# Patient Record
Sex: Male | Born: 1960 | Race: White | Hispanic: No | Marital: Single | Smoking: Never smoker
Health system: Southern US, Community
[De-identification: ages and names within clinical notes are randomized; demographics above are authoritative.]

## PROBLEM LIST (undated history)

## (undated) DIAGNOSIS — Z21 Asymptomatic human immunodeficiency virus [HIV] infection status: Secondary | ICD-10-CM

## (undated) DIAGNOSIS — IMO0002 Reserved for concepts with insufficient information to code with codable children: Secondary | ICD-10-CM

## (undated) DIAGNOSIS — C210 Malignant neoplasm of anus, unspecified: Secondary | ICD-10-CM

## (undated) DIAGNOSIS — I2510 Atherosclerotic heart disease of native coronary artery without angina pectoris: Secondary | ICD-10-CM

## (undated) DIAGNOSIS — Z86004 Personal history of in-situ neoplasm of other and unspecified digestive organs: Secondary | ICD-10-CM

## (undated) DIAGNOSIS — K5730 Diverticulosis of large intestine without perforation or abscess without bleeding: Secondary | ICD-10-CM

## (undated) DIAGNOSIS — R112 Nausea with vomiting, unspecified: Secondary | ICD-10-CM

## (undated) DIAGNOSIS — C211 Malignant neoplasm of anal canal: Secondary | ICD-10-CM

## (undated) DIAGNOSIS — I10 Essential (primary) hypertension: Secondary | ICD-10-CM

## (undated) DIAGNOSIS — Z85048 Personal history of other malignant neoplasm of rectum, rectosigmoid junction, and anus: Secondary | ICD-10-CM

## (undated) DIAGNOSIS — Z9889 Other specified postprocedural states: Secondary | ICD-10-CM

## (undated) DIAGNOSIS — Z9189 Other specified personal risk factors, not elsewhere classified: Secondary | ICD-10-CM

## (undated) DIAGNOSIS — F419 Anxiety disorder, unspecified: Secondary | ICD-10-CM

## (undated) DIAGNOSIS — E119 Type 2 diabetes mellitus without complications: Secondary | ICD-10-CM

## (undated) DIAGNOSIS — Z973 Presence of spectacles and contact lenses: Secondary | ICD-10-CM

## (undated) DIAGNOSIS — F32A Depression, unspecified: Secondary | ICD-10-CM

## (undated) DIAGNOSIS — Z955 Presence of coronary angioplasty implant and graft: Secondary | ICD-10-CM

## (undated) DIAGNOSIS — N529 Male erectile dysfunction, unspecified: Secondary | ICD-10-CM

## (undated) DIAGNOSIS — E785 Hyperlipidemia, unspecified: Secondary | ICD-10-CM

## (undated) DIAGNOSIS — F329 Major depressive disorder, single episode, unspecified: Secondary | ICD-10-CM

## (undated) HISTORY — PX: CORONARY ANGIOPLASTY WITH STENT PLACEMENT: SHX49

---

## 1983-06-26 HISTORY — PX: SEPTOPLASTY: SUR1290

## 1986-06-25 HISTORY — PX: INGUINAL HERNIA REPAIR: SUR1180

## 1997-02-17 ENCOUNTER — Encounter: Payer: Self-pay | Admitting: Cardiovascular Disease

## 2004-05-30 ENCOUNTER — Ambulatory Visit: Payer: Self-pay | Admitting: Internal Medicine

## 2006-08-16 ENCOUNTER — Ambulatory Visit: Payer: Self-pay | Admitting: Internal Medicine

## 2006-09-17 ENCOUNTER — Ambulatory Visit: Payer: Self-pay | Admitting: Internal Medicine

## 2006-09-24 ENCOUNTER — Ambulatory Visit: Payer: Self-pay | Admitting: Internal Medicine

## 2006-10-24 ENCOUNTER — Ambulatory Visit: Payer: Self-pay | Admitting: Internal Medicine

## 2006-11-24 ENCOUNTER — Ambulatory Visit: Payer: Self-pay | Admitting: Internal Medicine

## 2006-12-02 ENCOUNTER — Ambulatory Visit: Payer: Self-pay | Admitting: Internal Medicine

## 2006-12-12 ENCOUNTER — Ambulatory Visit: Payer: Self-pay | Admitting: Internal Medicine

## 2006-12-24 ENCOUNTER — Ambulatory Visit: Payer: Self-pay | Admitting: Internal Medicine

## 2007-06-26 HISTORY — PX: OTHER SURGICAL HISTORY: SHX169

## 2007-06-26 HISTORY — PX: SPHINCTEROTOMY: SHX5279

## 2008-05-28 ENCOUNTER — Ambulatory Visit: Payer: Self-pay | Admitting: General Surgery

## 2008-05-28 HISTORY — PX: COLONOSCOPY WITH PROPOFOL: SHX5780

## 2008-06-25 HISTORY — PX: OTHER SURGICAL HISTORY: SHX169

## 2008-08-06 ENCOUNTER — Ambulatory Visit: Payer: Self-pay | Admitting: General Surgery

## 2008-09-23 HISTORY — PX: CARDIOVASCULAR STRESS TEST: SHX262

## 2008-10-15 ENCOUNTER — Encounter: Payer: Self-pay | Admitting: Cardiovascular Disease

## 2009-03-25 ENCOUNTER — Ambulatory Visit: Payer: Self-pay | Admitting: Oncology

## 2009-04-01 ENCOUNTER — Encounter: Payer: Self-pay | Admitting: Cardiovascular Disease

## 2009-04-22 ENCOUNTER — Encounter: Payer: Self-pay | Admitting: Cardiovascular Disease

## 2009-04-25 ENCOUNTER — Ambulatory Visit: Payer: Self-pay | Admitting: Internal Medicine

## 2009-04-29 ENCOUNTER — Ambulatory Visit: Payer: Self-pay | Admitting: Internal Medicine

## 2009-05-25 ENCOUNTER — Ambulatory Visit: Payer: Self-pay | Admitting: Internal Medicine

## 2009-06-08 ENCOUNTER — Ambulatory Visit: Payer: Self-pay | Admitting: General Surgery

## 2009-06-08 HISTORY — PX: OTHER SURGICAL HISTORY: SHX169

## 2009-09-13 DIAGNOSIS — I10 Essential (primary) hypertension: Secondary | ICD-10-CM

## 2009-09-13 DIAGNOSIS — E785 Hyperlipidemia, unspecified: Secondary | ICD-10-CM | POA: Insufficient documentation

## 2009-09-13 DIAGNOSIS — I2510 Atherosclerotic heart disease of native coronary artery without angina pectoris: Secondary | ICD-10-CM

## 2010-04-26 ENCOUNTER — Ambulatory Visit: Payer: Self-pay | Admitting: Cardiovascular Disease

## 2010-06-03 ENCOUNTER — Encounter: Payer: Self-pay | Admitting: Cardiovascular Disease

## 2010-07-05 ENCOUNTER — Telehealth: Payer: Self-pay | Admitting: Cardiovascular Disease

## 2010-07-25 NOTE — Assessment & Plan Note (Signed)
Summary: ec6   Visit Type:  Initial Consult Primary Provider:  Orson Aloe, M.D.  CC:  Est. care; former patient of [facility]. Denies chest pain or shortness of breath. .  History of Present Illness: Julian Palmer is a very pleasant 50 year old gentleman with a history of coronary artery disease, stent placed to his proximal LAD in 1998 with angioplasty to the mid and distal LAD, diabetes, hypertension who presents for routine followup. He was last in the [facility] in October 2010.  Overall, he reports that he is doing well. He is active, has no shortness of breath or chest pain. He did decrease his Vytorin in half to 5/40 mg daily as his cholesterol is less than 100 when last checked in October of 2010.  He is having some family issues as his father has a significant drinking problem. He went into rehabilitation [facility] for Julian Palmer and continues to drink. This is affected his relationship with his father as he continues to encourage him to seek treatment with his father often refusing any help.  EKG shows normal sinus rhythm with rate 89 beats per minute  , no significant ST-T wave changesCardiac catheterization report from 1998 shows a totally occluded LAD after the first septal branch. Otherwise no other significant coronary artery disease. Balloon angioplasty was performed of the proximal mid LAD, stent placed to the proximal LAD with a 3.5 x 15 mm Multi-Link stent. Distal LAD was then angioplastied  Stress test in April 2010 was low risk scan, where he achieved 10 METS, with a very small region of mildly reduced perfusion is mildly reversible in the distal LAD territory  Preventive Screening-Counseling & Management  Alcohol-Tobacco     Smoking Status: never  Caffeine-Diet-Exercise     Does Patient Exercise: no      Drug Use:  no.    Current Medications (verified): 1)  Niaspan 1000 Mg Cr-Tabs (Niacin (Antihyperlipidemic)) .Marland Kitchen.. 1 Tab By Mouth At Bedtime,  30 Minutes After Aspirin 2)  Vytorin 10-40 Mg Tabs (Ezetimibe-Simvastatin) .... Take One Tablet By Mouth Dailyat Bedtime 3)  Cozaar 100 Mg Tabs (Losartan Potassium) .... One Tablet Once Daily 4)  Aspirin Ec 325 Mg Tbec (Aspirin) .... Take One Tablet By Mouth Daily 5)  Metformin Hcl 1000 Mg Tabs (Metformin Hcl) .Marland Kitchen.. 1 Tab Two Times A Day 6)  Multivitamins  Tabs (Multiple Vitamin) .Marland Kitchen.. 1 Tab Daily 7)  Glimepiride 2 Mg Tabs (Glimepiride) .Marland Kitchen.. 1 Tab Daily 8)  Paxil 20 Mg Tabs (Paroxetine Hcl) .Marland Kitchen.. 1-2 Tablets Once Daily  Allergies (verified): 1)  ! Sulfa  Past History:  Past Medical History: Last updated: 09/13/2009 CAD stent to his proximal LAD in 1998 angioplasty to the mid to distal LAD DM Hyperlipidemia Hypertension  Past Surgical History: Last updated: 09/13/2009 stent  Family History: Last updated: 04/26/2010 Father: Living; CAD Mother: Living; Hypertension, breast cancer survivor, diabetes Type II.  Social History: Last updated: 04/26/2010 Full Time Single  Tobacco Use - No.  Alcohol Use - yes--occas. Regular Exercise - no Drug Use - no  Risk Factors: Exercise: no (04/26/2010)  Risk Factors: Smoking Status: never (04/26/2010)  Family History: Father: Living; CAD Mother: Living; Hypertension, breast cancer survivor, diabetes Type II.  Social History: Full Time Single  Tobacco Use - No.  Alcohol Use - yes--occas. Regular Exercise - no Drug Use - no Smoking Status:  never Does Patient Exercise:  no Drug Use:  no  Review of Systems  The patient denies fever, weight  loss, weight gain, vision loss, decreased hearing, hoarseness, chest pain, syncope, dyspnea on exertion, peripheral edema, prolonged cough, abdominal pain, incontinence, muscle weakness, depression, and enlarged lymph nodes.    Vital Signs:  Patient profile:   50 year old male Height:      71 inches Weight:      224 pounds BMI:     31.35 Pulse rate:   89 / minute BP sitting:   112 / 72   (left arm) Cuff size:   regular  Vitals Entered By: Bishop Dublin, CMA (April 26, 2010 10:01 AM)  Physical Exam  General:  Well developed, well nourished, in no acute distress. Head:  normocephalic and atraumatic Neck:  Neck supple, no JVD. No masses, thyromegaly or abnormal cervical nodes. Lungs:  Clear bilaterally to auscultation and percussion. Heart:  Non-displaced PMI, chest non-tender; regular rate and rhythm, S1, S2 without murmurs, rubs or gallops. Carotid upstroke normal, no bruit.  Pedals normal pulses. No edema, no varicosities. Abdomen:  Bowel sounds positive; abdomen soft and non-tender without masses, mildly obese Msk:  Back normal, normal gait. Muscle strength and tone normal. Pulses:  pulses normal in all 4 extremities Extremities:  No clubbing or cyanosis. Neurologic:  Alert and oriented x 3. Skin:  Intact without lesions or rashes. Psych:  Normal affect.   Impression & Recommendations:  Problem # 1:  CAD, NATIVE VESSEL (ICD-414.01) history coronary artery disease, stent to the LAD. No symptoms of angina at this time. Negative stress test last year in April that was low risk with some distal LAD perfusion abnormality. No further testing at this time.  His updated medication list for this problem includes:    Aspirin 81 Mg Tbec (Aspirin) .Marland Kitchen... Take 2  tablets by mouth daily  Problem # 2:  HYPERLIPIDEMIA-MIXED (ICD-272.4) We will try to obtain his most recent lipid panel from Dr. Leavy Cella. Goal LDL is less than 70.  His updated medication list for this problem includes:    Niaspan 1000 Mg Cr-tabs (Niacin (antihyperlipidemic)) .Marland Kitchen... 1 tab by mouth at bedtime, 30 minutes after aspirin    Vytorin 10-40 Mg Tabs (Ezetimibe-simvastatin) .Marland Kitchen... Take one tablet by mouth dailyat bedtime  Problem # 3:  HYPERTENSION, BENIGN (ICD-401.1) Blood pressure is well controlled on his current medication regimen. We've made no changes.  His updated medication list for this  problem includes:    Cozaar 100 Mg Tabs (Losartan potassium) ..... One tablet once daily    Aspirin 81 Mg Tbec (Aspirin) .Marland Kitchen... Take 2  tablets by mouth daily  Patient Instructions: 1)  Your physician has recommended you make the following change in your medication: DECREASE aspirin 81mg  2 tabs daily 2)  Your physician wants you to follow-up in:   1 year You will receive a reminder letter in the mail two months in advance. If you don't receive a letter, please call our office to schedule the follow-up appointment. Prescriptions: CIALIS 5 MG TABS (TADALAFIL) Take 1 tablet by mouth once a day  #30 x 4   Entered by:   Benedict Needy, RN   Authorized by:   Dossie Arbour MD   Signed by:   Benedict Needy, RN on 04/26/2010   Method used:   Print then Give to Patient   RxID:   [id number]

## 2010-07-27 NOTE — Letter (Signed)
Summary: [facility] Report [facility] Cath Report 1998   Imported By: Roderic Ovens 06/16/2010 11:14:31  _____________________________________________________________________  External Attachment:    Type:   Image     Comment:   External Document

## 2010-07-27 NOTE — Progress Notes (Signed)
Summary: RX  Phone Note Refill Request Call back at Home Phone [phone] Call back at [phone] Message from:  Patient on July 05, 2010 4:13 PM  Refills Requested: Medication #1:  VYTORIN 10-40 MG TABS Take one tablet by mouth dailyat bedtime   Notes: SHOULD HAVE BEEN A 90 DAY SUPPLY-THIS WAS CALLED IN FOR A 30 DAY SUPPLY AND THE PT CANNOT AFFORD THIS [facility] home delivery.  Initial call taken by: Harlon Flor,  July 05, 2010 4:14 PM  Follow-up for Phone Call        Notified patient have contacted [facility] for an update on Vytorin 10/40 mg one tablet at bedtime for a 90 day supply with 3 refills.  Told the patient we will get some samples of the Vytorin 10/40 mg to help  him until his medications is available for his inconvience. Patient is aware and okay with this matter. Follow-up by: Bishop Dublin, CMA,  July 06, 2010 12:48 PM    Prescriptions: VYTORIN 10-40 MG TABS (EZETIMIBE-SIMVASTATIN) Take one tablet by mouth dailyat bedtime  #90 x 3   Entered by:   Bishop Dublin, CMA   Authorized by:   Dossie Arbour MD   Signed by:   Bishop Dublin, CMA on 07/06/2010   Method used:   Historical   RxID:   [id number]   Appended Document: RX notified patient we have some samples available to pick up.  Gave Vytorin 10/40 mg for two month supply.

## 2010-08-21 ENCOUNTER — Telehealth: Payer: Self-pay | Admitting: Cardiovascular Disease

## 2010-08-31 NOTE — Progress Notes (Signed)
Summary: RX  Phone Note Refill Request Call back at Home Phone [phone] Message from:  Patient on August 21, 2010 3:58 PM  Refills Requested: Medication #1:  LOSARTIN   Notes: 3 MONTH SUPPLY [facility] HOME DELIVERY  Initial call taken by: Harlon Flor,  August 21, 2010 3:58 PM    Prescriptions: COZAAR 100 MG TABS (LOSARTAN POTASSIUM) one tablet once daily  #90 x 3   Entered by:   Lysbeth Galas CMA   Authorized by:   Dossie Arbour MD   Signed by:   Lysbeth Galas CMA on 08/22/2010   Method used:   Electronically to        [facility]* (mail-order)             , [place]         Ph: [phone]       Fax: [phone]   RxID:   [id number]

## 2011-01-10 ENCOUNTER — Encounter: Payer: Self-pay | Admitting: Cardiology

## 2011-01-24 DIAGNOSIS — IMO0001 Reserved for inherently not codable concepts without codable children: Secondary | ICD-10-CM

## 2011-01-24 HISTORY — DX: Reserved for inherently not codable concepts without codable children: IMO0001

## 2011-02-05 ENCOUNTER — Ambulatory Visit: Payer: Self-pay | Admitting: General Surgery

## 2011-02-05 HISTORY — PX: OTHER SURGICAL HISTORY: SHX169

## 2011-02-06 LAB — PATHOLOGY REPORT

## 2011-02-08 ENCOUNTER — Ambulatory Visit: Payer: Self-pay | Admitting: Oncology

## 2011-02-09 ENCOUNTER — Ambulatory Visit: Payer: Self-pay | Admitting: Oncology

## 2011-02-21 ENCOUNTER — Ambulatory Visit: Payer: Self-pay | Admitting: General Surgery

## 2011-02-21 HISTORY — PX: OTHER SURGICAL HISTORY: SHX169

## 2011-02-24 ENCOUNTER — Ambulatory Visit: Payer: Self-pay | Admitting: Oncology

## 2011-03-26 ENCOUNTER — Ambulatory Visit: Payer: Self-pay | Admitting: Oncology

## 2011-04-26 ENCOUNTER — Ambulatory Visit: Payer: Self-pay | Admitting: Oncology

## 2011-05-04 ENCOUNTER — Ambulatory Visit: Payer: Self-pay | Admitting: Cardiovascular Disease

## 2011-05-07 ENCOUNTER — Encounter: Payer: Self-pay | Admitting: Cardiovascular Disease

## 2011-05-09 ENCOUNTER — Ambulatory Visit: Payer: Self-pay | Admitting: Cardiovascular Disease

## 2011-05-18 ENCOUNTER — Encounter: Payer: Self-pay | Admitting: Cardiovascular Disease

## 2011-05-18 ENCOUNTER — Ambulatory Visit (INDEPENDENT_AMBULATORY_CARE_PROVIDER_SITE_OTHER): Payer: Private Health Insurance - Indemnity | Admitting: Cardiovascular Disease

## 2011-05-18 DIAGNOSIS — C211 Malignant neoplasm of anal canal: Secondary | ICD-10-CM

## 2011-05-18 DIAGNOSIS — E785 Hyperlipidemia, unspecified: Secondary | ICD-10-CM

## 2011-05-18 DIAGNOSIS — I2510 Atherosclerotic heart disease of native coronary artery without angina pectoris: Secondary | ICD-10-CM

## 2011-05-18 DIAGNOSIS — C20 Malignant neoplasm of rectum: Secondary | ICD-10-CM

## 2011-05-18 DIAGNOSIS — I10 Essential (primary) hypertension: Secondary | ICD-10-CM

## 2011-05-18 DIAGNOSIS — Z79899 Other long term (current) drug therapy: Secondary | ICD-10-CM

## 2011-05-18 HISTORY — DX: Malignant neoplasm of anal canal: C21.1

## 2011-05-18 MED ORDER — NIACIN ER (ANTIHYPERLIPIDEMIC) 1000 MG PO TBCR: 1000.0000 mg | EXTENDED_RELEASE_TABLET | Freq: Every day | ORAL | Status: DC

## 2011-05-18 MED ORDER — EZETIMIBE-SIMVASTATIN 10-40 MG PO TABS: 1.0000 | ORAL_TABLET | Freq: Every day | ORAL | Status: DC

## 2011-05-18 MED ORDER — LOSARTAN POTASSIUM 100 MG PO TABS: 100.0000 mg | ORAL_TABLET | Freq: Every day | ORAL | Status: DC

## 2011-05-18 NOTE — Assessment & Plan Note (Signed)
Currently with no symptoms of angina. No further workup at this time. Continue current medication regimen. 

## 2011-05-18 NOTE — Progress Notes (Signed)
Patient ID: Julian Palmer, male    DOB: December 29, 1960, 50 y.o.   MRN: [id number]  HPI Comments: Julian Palmer is a very pleasant 50 year old gentleman with a history of coronary artery disease, stent placed to his proximal LAD in 1998 with angioplasty to the mid and distal LAD, diabetes, hypertension who presents for routine followup.    Overall, he reports that he is doing well. He has had a very difficult couple of months. He was diagnosed with rectal cancer and had resection on August 13, followed by radiation of more than 30 cycles, chemotherapy for 2 weeks. He is starting to get his strength back. He did have significant burning from radiation in his inguinal area. He denies any shortness of breath or chest pain.   EKG shows normal sinus rhythm with rate 89 beats per minute   Cardiac catheterization report from 1998 shows a totally occluded LAD after the first septal branch. Otherwise no other significant coronary artery disease. Balloon angioplasty was performed of the proximal mid LAD, stent placed to the proximal LAD with a 3.5 x 15 mm Multi-Link stent. Distal LAD was then angioplastied   Stress test in April 2010 was low risk scan, where he achieved 10 METS, with a very small region of mildly reduced perfusion is mildly reversible in the distal LAD territory  EKG shows normal sinus rhythm with rate 82 beats per minute with no significant ST or T wave changes, old anteroseptal infarct   Outpatient Encounter Prescriptions as of 05/18/2011  Medication Sig Dispense Refill  . aspirin (ASPIR-81) 81 MG EC tablet Take 81 mg by mouth daily. Take 2 tabs       . efavirenz-emtrictabine-tenofovir (ATRIPLA) 600-200-300 MG per tablet Take 1 tablet by mouth daily.        Marland Kitchen ezetimibe-simvastatin (VYTORIN) 10-40 MG per tablet Take 1 tablet by mouth at bedtime.  90 tablet  4  . glimepiride (AMARYL) 2 MG tablet Take 2 mg by mouth daily.        Marland Kitchen losartan (COZAAR) 100 MG tablet Take 1 tablet (100 mg total) by  mouth daily.  90 tablet  4  . metFORMIN (GLUCOPHAGE) 1000 MG tablet Take 1,000 mg by mouth 2 (two) times daily.        . Multiple Vitamins-Minerals (MULTIVITAL) tablet Take 1 tablet by mouth daily.        . niacin (NIASPAN) 1000 MG CR tablet Take 1 tablet (1,000 mg total) by mouth at bedtime. 30 minutes after aspirin  90 tablet  4  . PARoxetine (PAXIL) 20 MG tablet Take 20 mg by mouth daily. Take 1-2 tabs         Review of Systems  Constitutional: Negative.   HENT: Negative.   Eyes: Negative.   Respiratory: Negative.   Cardiovascular: Negative.   Gastrointestinal: Negative.   Musculoskeletal: Negative.   Skin: Negative.   Neurological: Negative.   Hematological: Negative.   Psychiatric/Behavioral: Negative.   All other systems reviewed and are negative.    BP 112/82  Pulse 82  Ht 5\' 11"  (1.803 m)  Wt 225 lb 12.8 oz (102.422 kg)  BMI 31.49 kg/m2  Physical Exam  Nursing note and vitals reviewed. Constitutional: He is oriented to person, place, and time. He appears well-developed and well-nourished.  HENT:  Head: Normocephalic.  Nose: Nose normal.  Mouth/Throat: Oropharynx is clear and moist.  Eyes: Conjunctivae are normal. Pupils are equal, round, and reactive to light.  Neck: Normal range of motion. Neck supple.  No JVD present.  Cardiovascular: Normal rate, regular rhythm, S1 normal, S2 normal, normal heart sounds and intact distal pulses.  Exam reveals no gallop and no friction rub.   No murmur heard. Pulmonary/Chest: Effort normal and breath sounds normal. No respiratory distress. He has no wheezes. He has no rales. He exhibits no tenderness.  Abdominal: Soft. Bowel sounds are normal. He exhibits no distension. There is no tenderness.  Musculoskeletal: Normal range of motion. He exhibits no edema and no tenderness.  Lymphadenopathy:    He has no cervical adenopathy.  Neurological: He is alert and oriented to person, place, and time. Coordination normal.  Skin: Skin is  warm and dry. No rash noted. No erythema.  Psychiatric: He has a normal mood and affect. His behavior is normal. Judgment and thought content normal.           Assessment and Plan

## 2011-05-18 NOTE — Patient Instructions (Addendum)
You are doing well. No medication changes were made. We will check cholesterol (fasting) in January 2013  Please call us if you have new issues that need to be addressed before your next appt.  The office will contact you for a follow up Appt. In 12 months

## 2011-05-18 NOTE — Assessment & Plan Note (Signed)
Recent resection, radiation treatment, chemotherapy. Currently no further treatment scheduled.

## 2011-05-18 NOTE — Assessment & Plan Note (Signed)
Cholesterol is at goal on the current lipid regimen. No changes to the medications were made.  

## 2011-05-18 NOTE — Assessment & Plan Note (Signed)
Blood pressure is well controlled on today's visit. No changes made to the medications. 

## 2011-05-26 ENCOUNTER — Ambulatory Visit: Payer: Self-pay | Admitting: Oncology

## 2011-06-11 ENCOUNTER — Encounter: Payer: Self-pay | Admitting: Cardiovascular Disease

## 2011-06-26 ENCOUNTER — Ambulatory Visit: Payer: Self-pay | Admitting: Oncology

## 2011-08-10 ENCOUNTER — Ambulatory Visit: Payer: Self-pay | Admitting: Internal Medicine

## 2011-08-22 ENCOUNTER — Other Ambulatory Visit: Payer: Self-pay | Admitting: Cardiovascular Disease

## 2011-08-22 MED ORDER — LOSARTAN POTASSIUM 100 MG PO TABS: 100.0000 mg | ORAL_TABLET | Freq: Every day | ORAL | Status: DC

## 2011-08-22 MED ORDER — NIACIN ER (ANTIHYPERLIPIDEMIC) 1000 MG PO TBCR: 1000.0000 mg | EXTENDED_RELEASE_TABLET | Freq: Every day | ORAL | Status: DC

## 2011-08-24 ENCOUNTER — Ambulatory Visit: Payer: Self-pay | Admitting: Internal Medicine

## 2011-09-12 ENCOUNTER — Telehealth: Payer: Self-pay

## 2011-09-12 NOTE — Telephone Encounter (Signed)
Spoke with Misty Stanley (pharmacist Tech) with [facility] home delivery for 90 day supply with 3 refills on losartan 100 mg take one tablet daily and Niaspan ER 1000 mg take one tablet daily 90 with 3 refills.  Spoke with Rosey Bath (pharmacisit) to verify the refill did go through.

## 2011-09-24 ENCOUNTER — Ambulatory Visit: Payer: Self-pay | Admitting: Internal Medicine

## 2011-10-24 ENCOUNTER — Ambulatory Visit: Payer: Self-pay | Admitting: Internal Medicine

## 2011-10-29 ENCOUNTER — Telehealth: Payer: Self-pay | Admitting: Cardiovascular Disease

## 2011-10-29 NOTE — Telephone Encounter (Signed)
Follow-up:     Patient called to say the proposed change would double his cost so it is not a viable option.  Please call back if you have any questions.

## 2011-10-29 NOTE — Telephone Encounter (Signed)
Spoke with pt. Pt will continue Vytorin and not change to Crestor and Zetia due to expense--he would have 2 co-pays instead of 1 co-pay.

## 2011-11-24 ENCOUNTER — Ambulatory Visit: Payer: Self-pay | Admitting: Internal Medicine

## 2011-12-24 ENCOUNTER — Ambulatory Visit: Payer: Self-pay | Admitting: Internal Medicine

## 2011-12-25 ENCOUNTER — Other Ambulatory Visit: Payer: Self-pay

## 2011-12-25 MED ORDER — EZETIMIBE-SIMVASTATIN 10-40 MG PO TABS: 1.0000 | ORAL_TABLET | Freq: Every day | ORAL | Status: DC

## 2011-12-25 NOTE — Telephone Encounter (Signed)
Refill sent to [facility] for Vytorin 10/40 mg take one tablet daily at bedtime.

## 2011-12-25 NOTE — Telephone Encounter (Signed)
Notified Diane with [facility] for refill on Vytorin 10/40 mg take one tablet qhs with 90 day supply and 3 refills. The [facility] apparently does not go through.

## 2012-02-04 ENCOUNTER — Ambulatory Visit: Payer: Self-pay | Admitting: Internal Medicine

## 2012-02-04 LAB — HEPATIC FUNCTION PANEL A (ARMC)
Albumin: 3.8 g/dL
Alkaline Phosphatase: 113 U/L
Bilirubin, Direct: 0.1 mg/dL
Bilirubin,Total: 0.3 mg/dL
SGOT(AST): 61 U/L — ABNORMAL HIGH
SGPT (ALT): 87 U/L — ABNORMAL HIGH
Total Protein: 8.1 g/dL

## 2012-02-04 LAB — CREATININE, SERUM
Creatinine: 1.02 mg/dL (ref 0.60–1.30)
EGFR (African American): >60
EGFR (Non-African Amer.): >60

## 2012-02-04 LAB — CBC CANCER CENTER
Basophil #: 0 x10 3/mm (ref 0.0–0.1)
Basophil %: 0.9 %
Eosinophil %: 3.5 %
HCT: 39.7 % — ABNORMAL LOW (ref 40.0–52.0)
HGB: 13.7 g/dL (ref 13.0–18.0)
Lymphocyte %: 37.8 %
MCV: 93 fL (ref 80–100)
Monocyte %: 9.3 %
Neutrophil #: 2.4 x10 3/mm (ref 1.4–6.5)
Neutrophil %: 48.5 %
RDW: 13 % (ref 11.5–14.5)
WBC: 4.8 x10 3/mm (ref 3.8–10.6)

## 2012-02-24 ENCOUNTER — Ambulatory Visit: Payer: Self-pay | Admitting: Internal Medicine

## 2012-05-20 ENCOUNTER — Ambulatory Visit: Payer: Private Health Insurance - Indemnity | Admitting: Cardiovascular Disease

## 2012-06-16 ENCOUNTER — Ambulatory Visit (INDEPENDENT_AMBULATORY_CARE_PROVIDER_SITE_OTHER): Payer: Private Health Insurance - Indemnity | Admitting: Cardiovascular Disease

## 2012-06-16 ENCOUNTER — Encounter: Payer: Self-pay | Admitting: Cardiovascular Disease

## 2012-06-16 VITALS — BP 124/82 | HR 76 | Ht 72.0 in | Wt 223.0 lb

## 2012-06-16 DIAGNOSIS — I2510 Atherosclerotic heart disease of native coronary artery without angina pectoris: Secondary | ICD-10-CM

## 2012-06-16 DIAGNOSIS — E785 Hyperlipidemia, unspecified: Secondary | ICD-10-CM

## 2012-06-16 DIAGNOSIS — I10 Essential (primary) hypertension: Secondary | ICD-10-CM

## 2012-06-16 MED ORDER — NIACIN ER (ANTIHYPERLIPIDEMIC) 1000 MG PO TBCR: 1000.0000 mg | EXTENDED_RELEASE_TABLET | Freq: Every day | ORAL | Status: DC

## 2012-06-16 MED ORDER — LOSARTAN POTASSIUM 100 MG PO TABS: 100.0000 mg | ORAL_TABLET | Freq: Every day | ORAL | Status: DC

## 2012-06-16 MED ORDER — EZETIMIBE-SIMVASTATIN 10-40 MG PO TABS: 1.0000 | ORAL_TABLET | Freq: Every day | ORAL | Status: DC

## 2012-06-16 NOTE — Assessment & Plan Note (Signed)
Blood pressure is well controlled on today's visit. No changes made to the medications. 

## 2012-06-16 NOTE — Progress Notes (Signed)
Patient ID: Julian Palmer, male    DOB: [birth year], 51 y.o.   MRN: [id number]  HPI Comments: Mr. Earnshaw is a very pleasant 51 year old gentleman with a history of coronary artery disease, stent placed to his proximal LAD in 1998 with angioplasty to the mid and distal LAD, diabetes, hypertension who presents for routine followup.     Prior dx of rectal cancer and had resection on February 05 2011, followed by radiation of more than 30 cycles, chemotherapy for 2 weeks. Overall he reports that he is doing well. His sugars are up and down. He continues to take Vytorin 10/40 mg daily. Cholesterol in April of 2013 was 177, LDL 77. He is gaining weight, not exercising on a regular basis  Cardiac catheterization report from 1998 shows a totally occluded LAD after the first septal branch. Otherwise no other significant coronary artery disease. Balloon angioplasty was performed of the proximal mid LAD, stent placed to the proximal LAD with a 3.5 x 15 mm Multi-Link stent. Distal LAD was then angioplastied   Stress test in April 2010 was low risk scan, where he achieved 10 METS, with a very small region of mildly reduced perfusion is mildly reversible in the distal LAD territory  EKG shows normal sinus rhythm with rate 76 beats per minute with no significant ST or T wave changes, old anteroseptal infarct   Outpatient Encounter Prescriptions as of 06/16/2012  Medication Sig Dispense Refill  . aspirin (ASPIR-81) 81 MG EC tablet Take 81 mg by mouth daily. Take 2 tabs       . efavirenz-emtrictabine-tenofovir (ATRIPLA) 600-200-300 MG per tablet Take 1 tablet by mouth daily.        Marland Kitchen ezetimibe-simvastatin (VYTORIN) 10-40 MG per tablet Take 1 tablet by mouth at bedtime.  90 tablet  4  . glimepiride (AMARYL) 2 MG tablet Take 4 mg by mouth daily.       Marland Kitchen losartan (COZAAR) 100 MG tablet Take 1 tablet (100 mg total) by mouth daily.  90 tablet  4  . metFORMIN (GLUCOPHAGE) 1000 MG tablet Take 1,000 mg by mouth 2 (two)  times daily.        . Multiple Vitamins-Minerals (MULTIVITAL) tablet Take 1 tablet by mouth daily.        . niacin (NIASPAN) 1000 MG CR tablet Take 1 tablet (1,000 mg total) by mouth at bedtime. 30 minutes after aspirin  90 tablet  4  . PARoxetine (PAXIL) 20 MG tablet Take 20 mg by mouth daily. Take 1-2 tabs         Review of Systems  Constitutional: Negative.   HENT: Negative.   Eyes: Negative.   Respiratory: Negative.   Cardiovascular: Negative.   Gastrointestinal: Negative.   Musculoskeletal: Negative.   Skin: Negative.   Neurological: Negative.   Hematological: Negative.   Psychiatric/Behavioral: Negative.   All other systems reviewed and are negative.    BP 124/82  Pulse 76  Ht 6' (1.829 m)  Wt 223 lb (101.152 kg)  BMI 30.24 kg/m2  Physical Exam  Nursing note and vitals reviewed. Constitutional: He is oriented to person, place, and time. He appears well-developed and well-nourished.  HENT:  Head: Normocephalic.  Nose: Nose normal.  Mouth/Throat: Oropharynx is clear and moist.  Eyes: Conjunctivae normal are normal. Pupils are equal, round, and reactive to light.  Neck: Normal range of motion. Neck supple. No JVD present.  Cardiovascular: Normal rate, regular rhythm, S1 normal, S2 normal, normal heart sounds and intact distal pulses.  Exam reveals no gallop and no friction rub.   No murmur heard. Pulmonary/Chest: Effort normal and breath sounds normal. No respiratory distress. He has no wheezes. He has no rales. He exhibits no tenderness.  Abdominal: Soft. Bowel sounds are normal. He exhibits no distension. There is no tenderness.  Musculoskeletal: Normal range of motion. He exhibits no edema and no tenderness.  Lymphadenopathy:    He has no cervical adenopathy.  Neurological: He is alert and oriented to person, place, and time. Coordination normal.  Skin: Skin is warm and dry. No rash noted. No erythema.  Psychiatric: He has a normal mood and affect. His behavior is  normal. Judgment and thought content normal.           Assessment and Plan

## 2012-06-16 NOTE — Assessment & Plan Note (Signed)
Currently with no symptoms of angina. No further workup at this time. Continue current medication regimen. 

## 2012-06-16 NOTE — Patient Instructions (Addendum)
You are doing well. No medication changes were made.  Please call us if you have new issues that need to be addressed before your next appt.  Your physician wants you to follow-up in: 12 months.  You will receive a reminder letter in the mail two months in advance. If you don't receive a letter, please call our office to schedule the follow-up appointment. 

## 2012-06-16 NOTE — Assessment & Plan Note (Signed)
Cholesterol is high. We have encouraged him to increase his exercise, watch his diet. We'll try to obtain his most recent lipid panel from Dr. Leavy Cella. We could add WelChol or fenofibrate if needed.

## 2012-06-27 ENCOUNTER — Other Ambulatory Visit: Payer: Self-pay | Admitting: Cardiovascular Disease

## 2012-06-27 MED ORDER — LOSARTAN POTASSIUM 100 MG PO TABS: 100.0000 mg | ORAL_TABLET | Freq: Every day | ORAL | Status: DC

## 2012-06-27 MED ORDER — NIACIN ER (ANTIHYPERLIPIDEMIC) 1000 MG PO TBCR: 1000.0000 mg | EXTENDED_RELEASE_TABLET | Freq: Every day | ORAL | Status: DC

## 2012-06-27 MED ORDER — EZETIMIBE-SIMVASTATIN 10-40 MG PO TABS: 1.0000 | ORAL_TABLET | Freq: Every day | ORAL | Status: DC

## 2012-06-27 NOTE — Telephone Encounter (Signed)
Refills were set to print and never received at pharmacy

## 2012-06-27 NOTE — Telephone Encounter (Signed)
Refilled Niaspan, Losartan and Vytorin to [facility] pharmacy.

## 2012-07-26 ENCOUNTER — Ambulatory Visit: Payer: Self-pay | Admitting: Internal Medicine

## 2012-08-04 ENCOUNTER — Telehealth: Payer: Self-pay | Admitting: *Deleted

## 2012-08-04 NOTE — Telephone Encounter (Signed)
Few options Could try atorvastatin 80 mg daily vytorin 80/10, cut in 1/2 and cholesterol will be too high crestor 40 with zetia the best

## 2012-08-04 NOTE — Telephone Encounter (Signed)
Please advise thanks.

## 2012-08-04 NOTE — Telephone Encounter (Signed)
Pt wants to know if there is a cheaper alternative to vytorin 10/40. Cost over $100 for 3 months

## 2012-08-05 ENCOUNTER — Other Ambulatory Visit: Payer: Self-pay

## 2012-08-05 MED ORDER — ATORVASTATIN CALCIUM 80 MG PO TABS: 80.0000 mg | ORAL_TABLET | Freq: Every day | ORAL | Status: DC

## 2012-08-05 NOTE — Telephone Encounter (Signed)
Pt informed He wishes to try atorvastatin 80 mg qd Will send to pharm

## 2012-08-05 NOTE — Telephone Encounter (Signed)
lmtcb

## 2012-08-11 LAB — CBC CANCER CENTER
Basophil #: 0.1 x10 3/mm (ref 0.0–0.1)
Basophil %: 1.1 %
Eosinophil #: 0.2 x10 3/mm (ref 0.0–0.7)
HCT: 42.3 % (ref 40.0–52.0)
HGB: 14.7 g/dL (ref 13.0–18.0)
Lymphocyte %: 37.1 %
MCHC: 34.7 g/dL (ref 32.0–36.0)
MCV: 92 fL (ref 80–100)
Monocyte #: 0.6 x10 3/mm (ref 0.2–1.0)
Monocyte %: 11.5 %
Platelet: 222 x10 3/mm (ref 150–440)
RBC: 4.6 10*6/uL (ref 4.40–5.90)
RDW: 13.1 % (ref 11.5–14.5)
WBC: 5.2 x10 3/mm (ref 3.8–10.6)

## 2012-08-11 LAB — CREATININE, SERUM
Creatinine: 1.09 mg/dL (ref 0.60–1.30)
EGFR (African American): >60

## 2012-08-11 LAB — HEPATIC FUNCTION PANEL A (ARMC)
Albumin: 4.1 g/dL (ref 3.4–5.0)
Alkaline Phosphatase: 99 U/L (ref 50–136)
Bilirubin, Direct: 0.1 mg/dL (ref 0.00–0.20)
Total Protein: 8.6 g/dL — ABNORMAL HIGH (ref 6.4–8.2)

## 2012-08-15 ENCOUNTER — Telehealth: Payer: Self-pay | Admitting: *Deleted

## 2012-08-15 MED ORDER — ATORVASTATIN CALCIUM 80 MG PO TABS: 80.0000 mg | ORAL_TABLET | Freq: Every day | ORAL | Status: DC

## 2012-08-15 NOTE — Telephone Encounter (Signed)
Pt called back to state he hasn't received his medication and doesn't think it has gotten through the [facility] Delivery system.  He states everytime he calls for refills there are problems with the system.  Please call Pharmacy to confirm his script was entered for the change of medicine.  Pt would like a call back to confirm this has been done.

## 2012-08-15 NOTE — Telephone Encounter (Signed)
Per pharmacist, pt called [employer] to tell them he doesn't want atorvastatin RX right now, says he received vytorin RX in mail pharmacist says they will need new RX sent via [facility] for when he needs to reorder Pt informed Understanding verb After he finishes current vytorin RX he will start atorvastatin as prescribed New RX will be sent to [place]

## 2012-08-15 NOTE — Telephone Encounter (Signed)
Error/sg

## 2012-08-15 NOTE — Telephone Encounter (Signed)
Per [employer] it is in the "pend/hold" status Unsure why. Not giving reason [employer] rep is checking with pharmacy to see if they can remove this med from "hold" status

## 2012-08-23 ENCOUNTER — Ambulatory Visit: Payer: Self-pay | Admitting: Internal Medicine

## 2012-11-05 ENCOUNTER — Ambulatory Visit: Payer: Self-pay | Admitting: Internal Medicine

## 2012-11-23 ENCOUNTER — Ambulatory Visit: Payer: Self-pay | Admitting: Internal Medicine

## 2013-04-17 ENCOUNTER — Encounter (INDEPENDENT_AMBULATORY_CARE_PROVIDER_SITE_OTHER): Payer: Self-pay

## 2013-04-17 ENCOUNTER — Encounter: Payer: Self-pay | Admitting: Cardiovascular Disease

## 2013-04-17 ENCOUNTER — Ambulatory Visit (INDEPENDENT_AMBULATORY_CARE_PROVIDER_SITE_OTHER): Payer: Private Health Insurance - Indemnity | Admitting: Cardiovascular Disease

## 2013-04-17 VITALS — BP 118/82 | HR 84 | Ht 71.0 in | Wt 218.0 lb

## 2013-04-17 DIAGNOSIS — E1165 Type 2 diabetes mellitus with hyperglycemia: Secondary | ICD-10-CM

## 2013-04-17 DIAGNOSIS — E785 Hyperlipidemia, unspecified: Secondary | ICD-10-CM

## 2013-04-17 DIAGNOSIS — C20 Malignant neoplasm of rectum: Secondary | ICD-10-CM

## 2013-04-17 DIAGNOSIS — I2510 Atherosclerotic heart disease of native coronary artery without angina pectoris: Secondary | ICD-10-CM

## 2013-04-17 DIAGNOSIS — N529 Male erectile dysfunction, unspecified: Secondary | ICD-10-CM

## 2013-04-17 MED ORDER — TADALAFIL 5 MG PO TABS: 5.0000 mg | ORAL_TABLET | Freq: Every day | ORAL | Status: DC | PRN

## 2013-04-17 MED ORDER — SILDENAFIL CITRATE 100 MG PO TABS: 100.0000 mg | ORAL_TABLET | Freq: Every day | ORAL | Status: DC | PRN

## 2013-04-17 NOTE — Assessment & Plan Note (Signed)
We have given him a prescription for Cialis and Viagra to try. We will refer him to urology, Dr. Achilles Dunk.  I am concerned about low testosterone history and recent chemotherapy with radiation for rectal cancer. He may need alternative therapies.

## 2013-04-17 NOTE — Progress Notes (Signed)
Patient ID: Julian Palmer, male    DOB: 1961/05/06, 52 y.o.   MRN: [id number]  HPI Comments: [name] is a very pleasant 52 year old gentleman with a history of coronary artery disease, stent placed to his proximal LAD in 1998 with angioplasty to the mid and distal LAD, diabetes, hypertension who presents for routine followup.    History of rectal cancer, s/p resection on February 05 2011, followed by radiation of more than 30 cycles, chemotherapy for 2 weeks. He reports significant blistering, particularly in his groin area from the radiation.  Overall he has been doing well. He does report that his sugars are up, hemoglobin A1c 7.4. No significant symptoms of chest pain or shortness of breath concerning for worsening CAD or ischemia. Energy is good, his working long hours. His biggest complaint is erectile dysfunction. This seemed to come on worse after his radiation and chemotherapy. He does report that his testosterone was low in the past. He did not tolerate the testosterone cream well as it seemed to be very slick on his skin, possibly leaving an oder. He is very interested in anything that might help his situation. Is currently on amoxicillin for a sinus infection  Cardiac catheterization report from 1998 shows a totally occluded LAD after the first septal branch. Otherwise no other significant coronary artery disease. Balloon angioplasty was performed of the proximal mid LAD, stent placed to the proximal LAD with a 3.5 x 15 mm Multi-Link stent. Distal LAD was then angioplastied   Stress test in April 2010 was low risk scan, where he achieved 10 METS, with a very small region of mildly reduced perfusion is mildly reversible in the distal LAD territory  Recent lab work shows total cholesterol 153, LDL 56, HDL 43, normal LFTs, creatinine 0.9, hematocrit 40, as mentioned hemoglobin A1c 7.4  EKG shows normal sinus rhythm with rate 84 beats per minute with no significant ST or T wave changes,  old anteroseptal infarct   Outpatient Encounter Prescriptions as of 04/17/2013  Medication Sig Dispense Refill  . amoxicillin-clavulanate (AUGMENTIN) 875-125 MG per tablet Take 1 tablet by mouth 2 (two) times daily.       Marland Kitchen aspirin (ASPIR-81) 81 MG EC tablet Take 81 mg by mouth daily. Take 2 tabs       . atorvastatin (LIPITOR) 80 MG tablet Take 1 tablet (80 mg total) by mouth daily.  90 tablet  3  . efavirenz-emtrictabine-tenofovir (ATRIPLA) 600-200-300 MG per tablet Take 1 tablet by mouth daily.        Marland Kitchen glimepiride (AMARYL) 2 MG tablet Take 8 mg tablets daily.      Marland Kitchen losartan (COZAAR) 100 MG tablet Take 1 tablet (100 mg total) by mouth daily.  90 tablet  4  . metFORMIN (GLUCOPHAGE) 1000 MG tablet Take 1,000 mg by mouth 2 (two) times daily.        . Multiple Vitamins-Minerals (MULTIVITAL) tablet Take 1 tablet by mouth daily.        . niacin (NIASPAN) 1000 MG CR tablet Take 1 tablet (1,000 mg total) by mouth at bedtime. 30 minutes after aspirin  90 tablet  4  . sildenafil (VIAGRA) 100 MG tablet Take 1 tablet (100 mg total) by mouth daily as needed for erectile dysfunction.  3 tablet  10  . sildenafil (VIAGRA) 100 MG tablet Take 1 tablet (100 mg total) by mouth daily as needed for erectile dysfunction.  3 tablet  10  . tadalafil (CIALIS) 5 MG tablet Take 1  tablet (5 mg total) by mouth daily as needed for erectile dysfunction.  30 tablet  6  . [DISCONTINUED] PARoxetine (PAXIL) 20 MG tablet Take 20 mg by mouth daily. Take 1-2 tabs        No facility-administered encounter medications on file as of 04/17/2013.    Review of Systems  Constitutional: Negative.   HENT: Negative.   Eyes: Negative.   Respiratory: Negative.   Cardiovascular: Negative.   Gastrointestinal: Negative.   Genitourinary:       Erectile dysfunction  Musculoskeletal: Negative.   Skin: Negative.   Neurological: Negative.   Psychiatric/Behavioral: Negative.   All other systems reviewed and are negative.    BP 118/82   Pulse 84  Ht 5\' 11"  (1.803 m)  Wt 218 lb (98.884 kg)  BMI 30.42 kg/m2  Physical Exam  Nursing note and vitals reviewed. Constitutional: He is oriented to person, place, and time. He appears well-developed and well-nourished.  HENT:  Head: Normocephalic.  Nose: Nose normal.  Mouth/Throat: Oropharynx is clear and moist.  Eyes: Conjunctivae are normal. Pupils are equal, round, and reactive to light.  Neck: Normal range of motion. Neck supple. No JVD present.  Cardiovascular: Normal rate, regular rhythm, S1 normal, S2 normal, normal heart sounds and intact distal pulses.  Exam reveals no gallop and no friction rub.   No murmur heard. Pulmonary/Chest: Effort normal and breath sounds normal. No respiratory distress. He has no wheezes. He has no rales. He exhibits no tenderness.  Abdominal: Soft. Bowel sounds are normal. He exhibits no distension. There is no tenderness.  Musculoskeletal: Normal range of motion. He exhibits no edema and no tenderness.  Lymphadenopathy:    He has no cervical adenopathy.  Neurological: He is alert and oriented to person, place, and time. Coordination normal.  Skin: Skin is warm and dry. No rash noted. No erythema.  Psychiatric: He has a normal mood and affect. His behavior is normal. Judgment and thought content normal.      Assessment and Plan

## 2013-04-17 NOTE — Assessment & Plan Note (Signed)
He reports he is in remission, has periodic exam.

## 2013-04-17 NOTE — Assessment & Plan Note (Signed)
Cholesterol is at goal on the current lipid regimen. No changes to the medications were made.  

## 2013-04-17 NOTE — Assessment & Plan Note (Signed)
Currently with no symptoms of angina. No further workup at this time. Continue current medication regimen. 

## 2013-04-17 NOTE — Assessment & Plan Note (Signed)
We have encouraged continued exercise, careful diet management in an effort to lose weight. 

## 2013-04-17 NOTE — Patient Instructions (Signed)
You are doing well.  Try the viagra and cialis (not at the same time!!)  Please call us if you have new issues that need to be addressed before your next appt.  Your physician wants you to follow-up in: 12 months.  You will receive a reminder letter in the mail two months in advance. If you don't receive a letter, please call our office to schedule the follow-up appointment.

## 2013-06-16 ENCOUNTER — Other Ambulatory Visit: Payer: Self-pay | Admitting: *Deleted

## 2013-06-16 ENCOUNTER — Telehealth: Payer: Self-pay | Admitting: *Deleted

## 2013-06-16 MED ORDER — LOSARTAN POTASSIUM 100 MG PO TABS: 100.0000 mg | ORAL_TABLET | Freq: Every day | ORAL | Status: DC

## 2013-06-16 NOTE — Telephone Encounter (Signed)
Requested Prescriptions   Signed Prescriptions Disp Refills  . losartan (COZAAR) 100 MG tablet 90 tablet 4    Sig: Take 1 tablet (100 mg total) by mouth daily.    Authorizing Provider: Antonieta Iba    Ordering User: Kendrick Fries

## 2013-06-16 NOTE — Telephone Encounter (Signed)
Losartin 100mg 

## 2013-06-22 ENCOUNTER — Other Ambulatory Visit: Payer: Self-pay | Admitting: *Deleted

## 2013-06-22 ENCOUNTER — Other Ambulatory Visit: Payer: Self-pay

## 2013-06-22 MED ORDER — LOSARTAN POTASSIUM 100 MG PO TABS: 100.0000 mg | ORAL_TABLET | Freq: Every day | ORAL | Status: DC

## 2013-06-22 NOTE — Telephone Encounter (Signed)
Pt requested that 90 day supply of losartan & niaspan be sent to [facility] for home delivery.

## 2013-06-22 NOTE — Telephone Encounter (Signed)
Patient called and needs precertification on  Losartin  (percert # [id number])

## 2013-06-23 NOTE — Telephone Encounter (Signed)
Faxed Prior Authorization form for Niaspan to [facility] [phone] awaiting approval.

## 2013-06-24 MED ORDER — NIACIN ER (ANTIHYPERLIPIDEMIC) 1000 MG PO TBCR: 1000.0000 mg | EXTENDED_RELEASE_TABLET | Freq: Every day | ORAL | Status: DC

## 2013-08-17 ENCOUNTER — Ambulatory Visit: Payer: Self-pay | Admitting: Internal Medicine

## 2013-08-18 LAB — CBC CANCER CENTER
BASOS PCT: 0.9 %
Basophil #: 0.1 x10 3/mm (ref 0.0–0.1)
EOS ABS: 0.3 x10 3/mm (ref 0.0–0.7)
EOS PCT: 4.7 %
HCT: 44.6 % (ref 40.0–52.0)
HGB: 14.8 g/dL (ref 13.0–18.0)
Lymphocyte #: 2 x10 3/mm (ref 1.0–3.6)
Lymphocyte %: 31.9 %
MCH: 31.3 pg (ref 26.0–34.0)
MCHC: 33.1 g/dL (ref 32.0–36.0)
MCV: 95 fL (ref 80–100)
MONO ABS: 0.6 x10 3/mm (ref 0.2–1.0)
Monocyte %: 9.6 %
NEUTROS ABS: 3.3 x10 3/mm (ref 1.4–6.5)
NEUTROS PCT: 52.9 %
PLATELETS: 224 x10 3/mm (ref 150–440)
RBC: 4.72 10*6/uL (ref 4.40–5.90)
RDW: 13 % (ref 11.5–14.5)
WBC: 6.2 x10 3/mm (ref 3.8–10.6)

## 2013-08-18 LAB — HEPATIC FUNCTION PANEL A (ARMC)
Albumin: 4 g/dL (ref 3.4–5.0)
Alkaline Phosphatase: 99 U/L
BILIRUBIN DIRECT: 0.1 mg/dL (ref 0.00–0.20)
BILIRUBIN TOTAL: 0.3 mg/dL (ref 0.2–1.0)
SGOT(AST): 23 U/L (ref 15–37)
SGPT (ALT): 47 U/L (ref 12–78)
TOTAL PROTEIN: 8.8 g/dL — AB (ref 6.4–8.2)

## 2013-08-18 LAB — CREATININE, SERUM
Creatinine: 1.17 mg/dL (ref 0.60–1.30)
EGFR (Non-African Amer.): >60

## 2013-08-23 ENCOUNTER — Ambulatory Visit: Payer: Self-pay | Admitting: Internal Medicine

## 2013-09-14 ENCOUNTER — Other Ambulatory Visit: Payer: Self-pay

## 2013-09-14 MED ORDER — NIACIN ER (ANTIHYPERLIPIDEMIC) 1000 MG PO TBCR: 1000.0000 mg | EXTENDED_RELEASE_TABLET | Freq: Every day | ORAL | Status: DC

## 2013-09-14 NOTE — Telephone Encounter (Signed)
Pt needs generic for Vytorin

## 2013-09-24 ENCOUNTER — Other Ambulatory Visit: Payer: Self-pay | Admitting: *Deleted

## 2013-09-24 MED ORDER — ATORVASTATIN CALCIUM 80 MG PO TABS: 80.0000 mg | ORAL_TABLET | Freq: Every day | ORAL | Status: DC

## 2013-09-24 NOTE — Telephone Encounter (Signed)
Requested Prescriptions   Signed Prescriptions Disp Refills  . atorvastatin (LIPITOR) 80 MG tablet 90 tablet 3    Sig: Take 1 tablet (80 mg total) by mouth daily.    Authorizing Provider: GOLLAN, TIMOTHY J    Ordering User: LOPEZ, MARINA C    

## 2013-10-20 ENCOUNTER — Ambulatory Visit: Payer: Self-pay | Admitting: Internal Medicine

## 2013-11-09 ENCOUNTER — Ambulatory Visit: Payer: Self-pay | Admitting: Internal Medicine

## 2013-11-10 ENCOUNTER — Ambulatory Visit: Payer: Self-pay | Admitting: Internal Medicine

## 2013-11-10 LAB — HEPATIC FUNCTION PANEL A (ARMC)
ALT: 48 U/L (ref 12–78)
AST: 26 U/L (ref 15–37)
Albumin: 4.1 g/dL (ref 3.4–5.0)
Alkaline Phosphatase: 81 U/L
Bilirubin, Direct: 0.1 mg/dL (ref 0.00–0.20)
Bilirubin,Total: 0.5 mg/dL (ref 0.2–1.0)
Total Protein: 8.4 g/dL — ABNORMAL HIGH (ref 6.4–8.2)

## 2013-11-10 LAB — CBC CANCER CENTER
Basophil #: 0.1 x10 3/mm (ref 0.0–0.1)
Basophil %: 1 %
Eosinophil #: 0.2 x10 3/mm (ref 0.0–0.7)
Eosinophil %: 2.8 %
HCT: 42.3 % (ref 40.0–52.0)
HGB: 14.5 g/dL (ref 13.0–18.0)
LYMPHS PCT: 34.9 %
Lymphocyte #: 1.9 x10 3/mm (ref 1.0–3.6)
MCH: 33 pg (ref 26.0–34.0)
MCHC: 34.4 g/dL (ref 32.0–36.0)
MCV: 96 fL (ref 80–100)
MONOS PCT: 11 %
Monocyte #: 0.6 x10 3/mm (ref 0.2–1.0)
Neutrophil #: 2.8 x10 3/mm (ref 1.4–6.5)
Neutrophil %: 50.3 %
Platelet: 185 x10 3/mm (ref 150–440)
RBC: 4.4 10*6/uL (ref 4.40–5.90)
RDW: 13.2 % (ref 11.5–14.5)
WBC: 5.5 x10 3/mm (ref 3.8–10.6)

## 2013-11-10 LAB — CREATININE, SERUM
Creatinine: 1.09 mg/dL (ref 0.60–1.30)
EGFR (Non-African Amer.): >60

## 2013-11-10 LAB — LACTATE DEHYDROGENASE: LDH: 239 U/L (ref 85–241)

## 2013-11-11 LAB — AFP TUMOR MARKER: AFP-Tumor Marker: 3.5 ng/mL (ref 0.0–8.3)

## 2013-11-11 LAB — CEA: CEA: 1.5 ng/mL (ref 0.0–4.7)

## 2013-11-23 ENCOUNTER — Ambulatory Visit: Payer: Self-pay | Admitting: Internal Medicine

## 2013-12-04 ENCOUNTER — Other Ambulatory Visit: Payer: Self-pay

## 2013-12-04 MED ORDER — NIACIN ER (ANTIHYPERLIPIDEMIC) 1000 MG PO TBCR: 1000.0000 mg | EXTENDED_RELEASE_TABLET | Freq: Every day | ORAL | Status: DC

## 2013-12-21 ENCOUNTER — Encounter: Payer: Self-pay | Admitting: General Surgery

## 2013-12-24 ENCOUNTER — Ambulatory Visit (INDEPENDENT_AMBULATORY_CARE_PROVIDER_SITE_OTHER): Payer: Managed Care, Other (non HMO) | Admitting: General Surgery

## 2013-12-24 ENCOUNTER — Encounter: Payer: Self-pay | Admitting: General Surgery

## 2013-12-24 ENCOUNTER — Other Ambulatory Visit: Payer: Self-pay

## 2013-12-24 VITALS — BP 124/72 | HR 76 | Resp 12 | Ht 71.0 in | Wt 214.0 lb

## 2013-12-24 DIAGNOSIS — D371 Neoplasm of uncertain behavior of stomach: Secondary | ICD-10-CM

## 2013-12-24 DIAGNOSIS — D375 Neoplasm of uncertain behavior of rectum: Secondary | ICD-10-CM

## 2013-12-24 DIAGNOSIS — Z85048 Personal history of other malignant neoplasm of rectum, rectosigmoid junction, and anus: Secondary | ICD-10-CM

## 2013-12-24 DIAGNOSIS — K6289 Other specified diseases of anus and rectum: Secondary | ICD-10-CM

## 2013-12-24 DIAGNOSIS — D378 Neoplasm of uncertain behavior of other specified digestive organs: Secondary | ICD-10-CM

## 2013-12-24 NOTE — Progress Notes (Signed)
Patient ID: Julian Palmer, male   DOB: [birth year], 53 y.o.   MRN: [id number]  Chief Complaint  Patient presents with  . Other    rectal mass    HPI Julian Palmer is a 53 y.o. male. here today for a rectal mass . He states he had noticed a small amount of dark blood with bowel movements (intermittently) over last few weeks. This prompted him to complete a digital exam at which time he identified a mass on the right side of the lower rectum.     HPI  Past Medical History  Diagnosis Date  . CAD (coronary artery disease)   . HTN (hypertension)   . HLD (hyperlipidemia)   . S/P angioplasty     stent to his proximal LAD in 1998 angioplasty to the mdi to distal LAD  . Carcinoma in situ of anal canal 2010  . Diabetes mellitus     Non insulin Dependent  . Cancer October 06, 2010    invasive squamous cell carcinoma, poorly differentiated, status post radiation/chemotherapy. 9 o'clock position.    Past Surgical History  Procedure Laterality Date  . Stent (other)    . Colonoscopy  2009  . Anal polyp excision  2009  . Hernia repair  1988  . Septoplasty  1985  . Excision anal mass  2012  . Angioplasty  1998  . Sphincterotomy  2009  . Anal skin squamous cell reexcision  2010    radiation and chemotherapy  . Insertion central venous access device w/ subcutaneous port      Family History  Problem Relation Age of Onset  . Diabetes type II Mother   . Breast cancer Mother     survivor  . Hypertension Mother   . Coronary artery disease Father     Social History History  Substance Use Topics  . Smoking status: Never Smoker   . Smokeless tobacco: Not on file     Comment: tobacco use- no   . Alcohol Use: No     Comment: occas.     Allergies  Allergen Reactions  . Sulfonamide Derivatives     Current Outpatient Prescriptions  Medication Sig Dispense Refill  . aspirin (ASPIR-81) 81 MG EC tablet Take 81 mg by mouth daily. Take 2 tabs       . atorvastatin (LIPITOR) 80 MG tablet  Take 1 tablet (80 mg total) by mouth daily.  90 tablet  3  . efavirenz-emtrictabine-tenofovir (ATRIPLA) 600-200-300 MG per tablet Take 1 tablet by mouth daily.        Marland Kitchen glimepiride (AMARYL) 2 MG tablet Take 8 mg tablets daily.      Marland Kitchen losartan (COZAAR) 100 MG tablet Take 1 tablet (100 mg total) by mouth daily.  90 tablet  4  . metFORMIN (GLUCOPHAGE) 1000 MG tablet Take 1,000 mg by mouth 2 (two) times daily.        . Multiple Vitamins-Minerals (MULTIVITAL) tablet Take 1 tablet by mouth daily.        . niacin (NIASPAN) 1000 MG CR tablet Take 1 tablet (1,000 mg total) by mouth at bedtime. 30 minutes after aspirin  90 tablet  3  . sildenafil (VIAGRA) 100 MG tablet Take 1 tablet (100 mg total) by mouth daily as needed for erectile dysfunction.  3 tablet  10  . testosterone cypionate (DEPOTESTOTERONE CYPIONATE) 200 MG/ML injection Inject 240 mg into the muscle.      . testosterone enanthate (DELATESTRYL) 200 MG/ML injection Inject 50 mg into  the muscle every 14 (fourteen) days. For IM use only       No current facility-administered medications for this visit.    Review of Systems Review of Systems  Constitutional: Negative.   Respiratory: Negative.   Cardiovascular: Negative.   Gastrointestinal: Negative.     Blood pressure 124/72, pulse 76, resp. rate 12, height 5\' 11"  (1.803 m), weight 214 lb (97.07 kg).  Physical Exam Physical Exam  Constitutional: He is oriented to person, place, and time. He appears well-developed and well-nourished.  Eyes: Conjunctivae are normal. No scleral icterus.  Cardiovascular: Normal rate, regular rhythm and normal heart sounds.   Pulmonary/Chest: Effort normal and breath sounds normal.  Abdominal: Soft. Bowel sounds are normal. There is no tenderness.  Genitourinary:        Neurological: He is alert and oriented to person, place, and time.  Skin: Skin is warm and dry.    Data Reviewed Anoscopy confirmed a fleshy mass at the 8-9 o'clock position (dorsal  lithotomy) just at the dentate line. The patient was amenable to biopsy and this was completed using a 5 mm biopsy forcep. 2 samples were obtained with scant bleeding.  Assessment    Rectal mass in area of previous invasive squamous cell carcinoma, status post chemoradiation.     Plan    The patient will be contacted when pathology is available. If a benign result was obtained, formal excision would be appropriate. If he has recurrent cancer, will be discussed surgical extirpation.     PCP: [name] 12/24/2013, 9:35 PM

## 2013-12-24 NOTE — Patient Instructions (Signed)
Follow up appointment to be announced.  

## 2013-12-29 LAB — PATHOLOGY

## 2013-12-30 ENCOUNTER — Telehealth: Payer: Self-pay | Admitting: General Surgery

## 2014-01-01 NOTE — Telephone Encounter (Signed)
Notified of benign path and need for formal excision. Wants to have procedure done in office. This may be difficult.  Will review and notify patient of plans.

## 2014-01-04 ENCOUNTER — Telehealth: Payer: Self-pay | Admitting: *Deleted

## 2014-01-04 NOTE — Telephone Encounter (Signed)
Message left for patient to call the office.  We need to arrange a surgery date. Also, need to ask patient about his daily aspirin dose.

## 2014-01-04 NOTE — Telephone Encounter (Signed)
Message copied by Dominga Ferry on Mon Jan 04, 2014  2:02 PM ------      Message from: Robert Bellow      Created: Sat Jan 02, 2014  9:45 AM       Notify the patient I don't think we can safely excise this area in the office, but we could do a with local anesthesia in the operating room which would make it necessary for him to have a driver. [id number], 30 minutes.       ----- Message -----         From: [facility] Lab Results In Interface         Sent: 12/30/2013  10:28 AM           To: Robert Bellow, MD                   ------

## 2014-01-04 NOTE — Telephone Encounter (Signed)
Patient's surgery has been scheduled for 01-08-14 at [facility]. This patient reports he is on one 81 mg aspirin daily. It is okay for patient to continue to take aspirin.

## 2014-01-05 ENCOUNTER — Other Ambulatory Visit: Payer: Self-pay | Admitting: General Surgery

## 2014-01-05 DIAGNOSIS — K6289 Other specified diseases of anus and rectum: Secondary | ICD-10-CM

## 2014-01-06 ENCOUNTER — Telehealth: Payer: Self-pay | Admitting: *Deleted

## 2014-01-06 ENCOUNTER — Telehealth: Payer: Self-pay

## 2014-01-06 NOTE — Telephone Encounter (Signed)
Leah notified as instructed. Surgery date is Friday, 01-08-14.

## 2014-01-06 NOTE — Telephone Encounter (Signed)
Julian Bellow, MD Tharon Aquas Otis Brace, LPN            Please instruct the patient to use one fleets enema one hour prior to his planned presentation at day surgery this coming Friday, July 17 in preparation for excision of the rectal lesion.        Message left for patient to call back regarding pre surgery instructions.

## 2014-01-06 NOTE — Telephone Encounter (Signed)
Notified patient as instructed, patient aware of instructions.

## 2014-01-06 NOTE — Telephone Encounter (Signed)
Message copied by Dominga Ferry on Wed Jan 06, 2014  9:30 AM ------      Message from: Jackson, Forest Gleason      Created: Tue Jan 05, 2014  5:16 PM       Please notify day surgery that we'll be doing this local with sedation rather than straight local. Thank you  Surgery 7/15. Patient notified about need for Fleets enema 1 hour prior to presentation. ------

## 2014-01-08 ENCOUNTER — Other Ambulatory Visit: Payer: Self-pay

## 2014-01-08 ENCOUNTER — Ambulatory Visit: Payer: Self-pay | Admitting: General Surgery

## 2014-01-08 ENCOUNTER — Encounter: Payer: Self-pay | Admitting: General Surgery

## 2014-01-08 DIAGNOSIS — D375 Neoplasm of uncertain behavior of rectum: Secondary | ICD-10-CM

## 2014-01-08 DIAGNOSIS — Z85048 Personal history of other malignant neoplasm of rectum, rectosigmoid junction, and anus: Secondary | ICD-10-CM

## 2014-01-08 DIAGNOSIS — D371 Neoplasm of uncertain behavior of stomach: Secondary | ICD-10-CM

## 2014-01-08 DIAGNOSIS — D378 Neoplasm of uncertain behavior of other specified digestive organs: Secondary | ICD-10-CM

## 2014-01-08 HISTORY — PX: OTHER SURGICAL HISTORY: SHX169

## 2014-01-11 ENCOUNTER — Telehealth: Payer: Self-pay | Admitting: General Surgery

## 2014-01-11 NOTE — Telephone Encounter (Signed)
Asked to call for path results.

## 2014-01-13 ENCOUNTER — Encounter: Payer: Self-pay | Admitting: General Surgery

## 2014-01-13 ENCOUNTER — Telehealth: Payer: Self-pay

## 2014-01-13 NOTE — Telephone Encounter (Signed)
I talked with Dr. Bary Castilla. I notified the patient to expect some bleeding on/off. Patient states it is not bad and it did occur after a BM, but that he will monitor the amount and call for increased concerns.

## 2014-01-13 NOTE — Telephone Encounter (Signed)
Patient called with concerns about bleeding from the rectum after surgery done on 01/08/14. He states that this just started today.

## 2014-01-13 NOTE — Telephone Encounter (Signed)
Message copied by Lesly Rubenstein on Wed Jan 13, 2014  8:51 AM ------      Message from: [place], Terry W      Created: Mon Jan 11, 2014  5:44 PM       Arrange for the patient to have a PET/CT: Staging recurrent anal cancer. ------

## 2014-01-13 NOTE — Telephone Encounter (Signed)
Spoke with patient about having a PET/CT scan done. Patient is scheduled for this at [facility] on 01/19/14 at 9:00 am. He will arrive by 8:45 am. He is to have a high protein, low carbohydrate meal 4 hours prior, he is to also increase his water intake and have no sugar or do any strenuous exercise in that time. He will take his medications as normal. patient is aware of date, time, and all instructions.

## 2014-01-19 ENCOUNTER — Encounter: Payer: Self-pay | Admitting: General Surgery

## 2014-01-19 ENCOUNTER — Ambulatory Visit: Payer: Self-pay | Admitting: General Surgery

## 2014-01-19 ENCOUNTER — Telehealth: Payer: Self-pay | Admitting: General Surgery

## 2014-01-19 ENCOUNTER — Ambulatory Visit (INDEPENDENT_AMBULATORY_CARE_PROVIDER_SITE_OTHER): Payer: Managed Care, Other (non HMO) | Admitting: General Surgery

## 2014-01-19 VITALS — BP 130/68 | HR 74 | Resp 12 | Ht 71.0 in | Wt 210.0 lb

## 2014-01-19 DIAGNOSIS — C211 Malignant neoplasm of anal canal: Secondary | ICD-10-CM

## 2014-01-19 NOTE — Progress Notes (Signed)
Patient ID: Julian Palmer, male   DOB: 1960/09/27, 53 y.o.   MRN: [id number]  Chief Complaint  Patient presents with  . Routine Post Op    rectal mass removal    HPI Julian Palmer is a 53 y.o. male here today for his post op rectal mass removal done on 01/08/14. Patient states he is doing okay. He had experienced a small amount of bleeding on postoperative day 3-4 which is resolved. He is having minimal pain. HPI  Past Medical History  Diagnosis Date  . CAD (coronary artery disease)   . HTN (hypertension)   . HLD (hyperlipidemia)   . S/P angioplasty     stent to his proximal LAD in 1998 angioplasty to the mdi to distal LAD  . Carcinoma in situ of anal canal 2010  . Diabetes mellitus     Non insulin Dependent  . Cancer October 06, 2010    invasive squamous cell carcinoma, poorly differentiated, status post radiation/chemotherapy. 9 o'clock position.    Past Surgical History  Procedure Laterality Date  . Stent (other)    . Colonoscopy  2009  . Anal polyp excision  2009  . Hernia repair  1988  . Septoplasty  1985  . Excision anal mass  2012  . Angioplasty  1998  . Sphincterotomy  2009  . Anal skin squamous cell reexcision  2010    radiation and chemotherapy  . Insertion central venous access device w/ subcutaneous port    . Rectal mass removal  01/08/14    Family History  Problem Relation Age of Onset  . Diabetes type II Mother   . Breast cancer Mother     survivor  . Hypertension Mother   . Coronary artery disease Father     Social History History  Substance Use Topics  . Smoking status: Never Smoker   . Smokeless tobacco: Not on file     Comment: tobacco use- no   . Alcohol Use: No     Comment: occas.     Allergies  Allergen Reactions  . Sulfa Antibiotics Hives  . Sulfonamide Derivatives     Current Outpatient Prescriptions  Medication Sig Dispense Refill  . aspirin (ASPIR-81) 81 MG EC tablet Take 81 mg by mouth daily. Take 2 tabs       . atorvastatin  (LIPITOR) 80 MG tablet Take 1 tablet (80 mg total) by mouth daily.  90 tablet  3  . efavirenz-emtrictabine-tenofovir (ATRIPLA) 600-200-300 MG per tablet Take 1 tablet by mouth daily.        Marland Kitchen glimepiride (AMARYL) 2 MG tablet Take 8 mg tablets daily.      Marland Kitchen losartan (COZAAR) 100 MG tablet Take 1 tablet (100 mg total) by mouth daily.  90 tablet  4  . metFORMIN (GLUCOPHAGE) 1000 MG tablet Take 1,000 mg by mouth 2 (two) times daily.        . Multiple Vitamins-Minerals (MULTIVITAL) tablet Take 1 tablet by mouth daily.        . niacin (NIASPAN) 1000 MG CR tablet Take 1 tablet (1,000 mg total) by mouth at bedtime. 30 minutes after aspirin  90 tablet  3  . sildenafil (VIAGRA) 100 MG tablet Take 1 tablet (100 mg total) by mouth daily as needed for erectile dysfunction.  3 tablet  10  . testosterone cypionate (DEPOTESTOTERONE CYPIONATE) 200 MG/ML injection Inject 240 mg into the muscle.      . testosterone enanthate (DELATESTRYL) 200 MG/ML injection Inject 50 mg into  the muscle every 14 (fourteen) days. For IM use only       No current facility-administered medications for this visit.    Review of Systems Review of Systems  Constitutional: Negative.   Respiratory: Negative.   Cardiovascular: Negative.     Blood pressure 130/68, pulse 74, resp. rate 12, height 5\' 11"  (1.803 m), weight 210 lb (95.255 kg).  Physical Exam Physical Exam External anal exam showed no inflammation or erythema. Digital exam was deferred.  Data Reviewed  Pathology showed invasive squamous cell carcinoma. Someone pathology to that at the time of his original diagnosis in August 2012.  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    Plan    The patient had completed combined chemoradiation with a complete clinical response. He is unlikely to be a candidate for additional radiation. The possibility of chemotherapy alone treating this is small. Standard treatment would be abdominal perineal  resection.  A PET scan is pending and the case will be presented at the [facility] tumor board on 01/21/2014.  The patient will be contacted by phone when the PET scan results are available and after tumor Board recommendations have been received.     PCP: [name] 01/21/2014, 12:15 PM

## 2014-01-19 NOTE — Telephone Encounter (Signed)
Notified PET/CT failed to show any additional disease. Area in ano-rectal area not identified. Case to be presented at [place] on July 30th.

## 2014-01-20 LAB — PATHOLOGY REPORT

## 2014-01-21 ENCOUNTER — Ambulatory Visit: Payer: Managed Care, Other (non HMO) | Admitting: General Surgery

## 2014-01-22 ENCOUNTER — Telehealth: Payer: Self-pay | Admitting: General Surgery

## 2014-01-22 DIAGNOSIS — F419 Anxiety disorder, unspecified: Secondary | ICD-10-CM

## 2014-01-22 DIAGNOSIS — F5105 Insomnia due to other mental disorder: Principal | ICD-10-CM

## 2014-01-22 MED ORDER — ZOLPIDEM TARTRATE 10 MG PO TABS: 10.0000 mg | ORAL_TABLET | Freq: Every evening | ORAL | Status: DC | PRN

## 2014-01-22 NOTE — Telephone Encounter (Signed)
I notified the patient that the recommendation from the [facility] tumor board was for abdominal perineal resection.  I spoke informally with the colorectal surgery Department at [facility] and they as well recommended abdominoperineal resection.  Patient is not yet ready decide on for this, and will likely be seeking a second opinion in [place]. This was encouraged.  He reports he has not slept since we talked earlier this week when we reviewed  the biopsy results. He was amenable to a trial of Ambien, 10 mg by mouth each bedtime. He denied any suicidal ideation.  The patient will contact the office if he needs assistance with with getting an appointment in [place] or desires referral to a local University.

## 2014-01-25 ENCOUNTER — Telehealth: Payer: Self-pay | Admitting: *Deleted

## 2014-01-25 ENCOUNTER — Telehealth: Payer: Self-pay

## 2014-01-25 NOTE — Telephone Encounter (Signed)
Patient reports he was able to get prescription from the pharmacy.

## 2014-01-25 NOTE — Telephone Encounter (Signed)
Patient states that he would like to go for a second opinion at [place] and needs [place] to call to set up a referral for him. I told him we would contact them to get this started and get them any records that they needed.

## 2014-01-25 NOTE — Telephone Encounter (Signed)
Spoke with patient about having a second opinion of surgical options done at [facility]. Patient is scheduled to see Dr Corie Chiquito on 02/09/14 at 2:50 pm. He is to arrive by 2:30 pm. Patient is aware of date, and time. All records have been faxed over to [place] at [place] [phone].

## 2014-01-25 NOTE — Telephone Encounter (Signed)
Message left for patient to call the office.  We need to see if he was able to pick up prescription for Ambien. It looks as if it did not go through electronically. Also, we need to verify which pharmacy he would like it sent to if it did not go through as suspected.

## 2014-01-26 ENCOUNTER — Telehealth: Payer: Self-pay | Admitting: *Deleted

## 2014-01-26 MED ORDER — CITALOPRAM HYDROBROMIDE 20 MG PO TABS: 20.0000 mg | ORAL_TABLET | Freq: Every day | ORAL | Status: DC

## 2014-01-26 NOTE — Telephone Encounter (Signed)
The office received a call from the patient's immediate supervisor, Garald Braver reporting that he was concerned about the patient's mental well-being. With the patient for about 25 minutes by phone at 1:15 this afternoon. He is very upset about the risk of dying during surgery, less so about having a colostomy. He did it in touch with his brother, and was planning to meet with his parents later in the afternoon to discuss his recent diagnosis of cancer. He did not on the phone appeared to be him at risk for self harm. He had a friend coming over and he appeared more optimistic at the end of our conversation at the beginning.  I spoke with a local psychiatrist who recommended Celexa 20 mg daily to have minimal drug interactions, and received information regarding the [place] walk-in assessment clinic.  I spoke with the patient at about 6:30 PM, and the meeting with his family had gone fairly well, and the friend who accompanied her in the afternoon was going to get him in touch with a person who had a colostomy to discuss life goes on afterwards.  He was going to pick up the Celexa prescription and he was given the contact information for walk-in psychiatric assessment. No call back if he cannot get an early appointment, otherwise will arrange for an elective appointment on August 17.  The patient has arranged for a second surgical opinion and [place] and records have been forwarded for this appointment.

## 2014-01-26 NOTE — Telephone Encounter (Signed)
Left message that a RX for Celexa 20 mg 1 po daily #30 with 5 refills as ordered by Dr Bary Castilla was sent electronically.

## 2014-01-27 ENCOUNTER — Telehealth: Payer: Self-pay

## 2014-01-27 ENCOUNTER — Telehealth: Payer: Self-pay | Admitting: *Deleted

## 2014-01-27 NOTE — Telephone Encounter (Signed)
Message copied by Lesly Rubenstein on Wed Jan 27, 2014  1:57 PM ------      Message from: [place], Forest Gleason      Created: Wed Jan 27, 2014 10:45 AM       Contact Dr. Maurine Simmering office and tell them I would like to take the August 17th appt they had available yesterday for this patient.            Thanks. ------

## 2014-01-27 NOTE — Telephone Encounter (Signed)
Patient called and said that he did not think that the 17th would be soon enough for him. I called [facility] office and they said that they had an opening available for August 12th. I spoke with Julian Palmer about this and he said that would be much better for him. I have faxed demographics and office notes to [facility] office. The patient is scheduled to see her on 02/03/14 at 10:00 am. He will arrive by 9:30 am to fill out new patient paperwork. Patient is aware of date and time.

## 2014-01-27 NOTE — Telephone Encounter (Signed)
Patient had gone to [facility] clinic this AM and was told they did not deal with [employer]. I had been told they would work to establish a referral to a covered provider.  Patient contacted by phone, He sounds much better than yesterday. Spoke w/ his immediate supervisor today who filled in some of the details on yesterday's events.  Have arrange for evaluation with Johna Roles, MD on August 12.    Celaxa RX started per patient report.

## 2014-01-27 NOTE — Telephone Encounter (Signed)
Pt called this morning saying that he [place] you [place] him about and they do not take his insurance, so he is calling you back about seeing another one.

## 2014-02-09 ENCOUNTER — Ambulatory Visit (INDEPENDENT_AMBULATORY_CARE_PROVIDER_SITE_OTHER): Payer: Private Health Insurance - Indemnity | Admitting: General Surgery

## 2014-02-09 ENCOUNTER — Telehealth (INDEPENDENT_AMBULATORY_CARE_PROVIDER_SITE_OTHER): Payer: Self-pay

## 2014-02-09 ENCOUNTER — Telehealth: Payer: Self-pay

## 2014-02-09 NOTE — Telephone Encounter (Signed)
Patient called to let us know that his appointment to go for a second opinion has been rescheduled to 02/16/14 and he is seeing Dr Jonita Albee. He also said that he no longer is taking Ambien. The psychiatrist has placed him on Trazodone 150 mg at bedtime and increased his Celexa to 40 mg daily. He reports that he is doing much better with these changes.

## 2014-02-12 NOTE — Telephone Encounter (Signed)
Erroneous encounter

## 2014-02-16 ENCOUNTER — Encounter (INDEPENDENT_AMBULATORY_CARE_PROVIDER_SITE_OTHER): Payer: Self-pay | Admitting: General Surgery

## 2014-02-16 ENCOUNTER — Ambulatory Visit (INDEPENDENT_AMBULATORY_CARE_PROVIDER_SITE_OTHER): Payer: Private Health Insurance - Indemnity | Admitting: General Surgery

## 2014-02-16 ENCOUNTER — Telehealth (INDEPENDENT_AMBULATORY_CARE_PROVIDER_SITE_OTHER): Payer: Self-pay | Admitting: General Surgery

## 2014-02-16 VITALS — BP 136/80 | HR 77 | Temp 97.4°F | Ht 71.0 in | Wt 207.0 lb

## 2014-02-16 DIAGNOSIS — C210 Malignant neoplasm of anus, unspecified: Secondary | ICD-10-CM

## 2014-02-16 NOTE — Patient Instructions (Signed)
Anal Cancer  What is anal cancer? Cancer describes a set of diseases in which normal cells in the body, through a series of genetic changes, become abnormal and lose the ability to control their growth. As cancers - also known as "malignancies" - grow, they invade the tissues around them (local invasion). They may also spread to other locations in the body via the blood vessels or lymphatic channels where they may implant and grow (metastases). The anus or anal canal is the passage that connects the rectum, or last part of the large intestine, to the outside of the body. Anal cancer arises from the cells around the anal opening or in the anal canal just inside the anal opening. Anal cancer is often a type of cancer called "squamous cell carcinoma". Other rare types of cancer may also occur in the anal canal and these require consultation with your physician or surgeon to determine the appropriate evaluation and treatment.  Cells that are becoming malignant or "premalignant", but have not invaded deeper into the skin, are referred to as "high-grade anal intraepithelial neoplasia" or HGAIN (previously referred to by a number of different terms, including "high grade dysplasia", "carcinoma-in-situ", "anal intra-epithelial neoplasia grade III", "high-grade squamous intraepithelial lesion", or "Bowen's disease"). While this condition is likely a precursor to anal cancer, this is not anal cancer and is treated differently than anal cancer. Your physician or colon and rectal surgeon can help clarify the differences. How common is anal cancer? Anal cancer is fairly uncommon, and accounts for about 1-2% of cancers affecting the intestinal tract. Approximately one in 64 men and women will get anal cancer in their lifetime (this can be compared to 1 in 59 men and women who will developed colon and rectal cancer in their lifetime). Almost 6,000 new cases of anal cancer are now diagnosed each year in the [place] with about  2/3rds of the cases in women. Approximately 800 people will die of the disease each year. Unlike some cancers, the numbers of patients that develop anal cancer each year is slowly increasing, especially in some higher risk groups (see below).  Who is at risk? A risk factor is something that increases a person's chance of getting a disease. Anal cancer is commonly associated with infection with the human papilloma virus (HPV), but some anal cancers develop without this infection being present. HPV can also cause warts in and around the anus as well as genital warts (on the penis in men and the vagina or cervix in women), but warts do not have to be present for anal cancer to develop. HPV is also associated with an increased risk of cervical and vaginal or vulvar cancer in women, penile cancer in men, as well as with some head and neck cancers in men and women. Having some of these cancers, especially cervical or vulvar cancer (or even pre-cancerous change in the cervix or vulva), can put people at increased risk for anal cancer - likely from the association with HPV infection.  Additional risk factors for anal cancer include: Age - While most of the cases of anal cancer develop in people over age 75, 1/3rd of the cases occur in patients that are younger than that.  Anal sex - People participating in anal sex, both men and women, are at increased risk.  Sexually transmitted diseases - Patients with multiple sex partners are at higher risk of getting sexually transmitted diseases like HPV and HIV and are, therefore, at an increased risk of developing anal  cancer.  Smoking - Harmful chemicals from smoking increases the risk of most cancers, including anal cancer.  Immunosuppression - People with weakened immune systems, such as transplant patients who must take drugs to suppress their immune systems and patients with HIV infection, are at higher risk.  Chronic local inflammation - People with long-standing anal  fistulas or open wounds in the anal area are at a slightly higher risk.  Pelvic radiation - People who have had pelvic radiation therapy for rectal, prostate, bladder or cervical cancer are at increased risk. Can anal cancer be prevented? Few cancers can be totally prevented, but the risk of developing anal cancer may be decreased significantly by avoiding the risk factors listed above and by getting regular checkups. Avoiding anal sex and infection with HPV and HIV can reduce the risk of developing anal cancer. Using condoms whenever having any kind of intercourse may reduce, but not eliminate, the risk of HPV infection. Smoking cessation lowers the risk of many types of cancer, including anal cancer. Vaccines against infection with certain types of HPV, especially in high-risk patients (see risk factors listed above), may also decrease the risk of developing anal cancer (in men and women). People who are at increased risk for anal cancer based on the risk factors listed above should talk to their doctors about consideration for anal cancer screening. This can include anal cytology or Pap tests (much like the Pap tests women undergo for cervical cancer screening). Early identification and treatment of premalignant lesions in the anus may also prevent the development of anal cancer.  What are the symptoms of anal cancer? Although 20% of anal cancers may be asymptomatic, many cases of anal cancer can be found early because they form in a part of the digestive tract that a doctor can see and reach easily. Anal cancers may cause symptoms such as:  Bleeding from the rectum or anus  The feeling of a lump or mass at the anal opening  Persistent or recurring pain in the anal area  Persistent or recurrent itching  Change in bowel habits (having more or fewer bowel movements) or increased straining during a bowel movement  Narrowing of the stools  Discharge or drainage (mucous or pus) from the anus  Swollen  lymph nodes (glands) in the anal or groin areas These symptoms can also be caused by less serious conditions such as hemorrhoids, but you should never assume this. If you have any of these symptoms, see your doctor or colon and rectal surgeon. How is anal cancer diagnosed? Anal cancer is usually found on examination of the anal canal because of the presence of symptoms listed above, on routine yearly physical exams by a physician (rectal exam for prostate check or at the time of a pelvic exam), or on screening tests such as those recommended for preventing or diagnosing colorectal cancer (for example: colonoscopy or lighted scope exam of the colon and rectum or yearly stool blood tests). Anoscopy, or examination of the anal canal with a small, lighted scope, may be performed as well to assess any abnormal findings. If an abnormal area in the anal canal is identified based on the doctor's exam, a biopsy will be performed to determine the diagnosis. If the diagnosis of anal cancer is confirmed, additional tests to determine the extent of the cancer may be recommended, which may include ultrasounds, Xrays, CT scans, and/or PET scans.  How are anal cancers treated? Treatment for most cases of anal cancer is very effective in  curing the cancer. There are 3 basic types of treatment used for anal cancer:  Surgery - an operation to remove the cancer  Radiation therapy - high-dose x-rays to kill cancer cells  Chemotherapy - giving drugs to kill cancer cells Combination therapy including radiation therapy and chemotherapy is now considered the standard treatment for most anal cancers. Occasionally, a very small or early tumor may be removed surgically (local excision) without the need for further treatment and with minimal damage to the anal sphincter muscles that are important for bowel control. On occasion, more major surgery to remove the anal cancer is needed, and this requires the creation of a colostomy where  the bowel is brought out to the skin on the belly wall where a bag is attached to collect the fecal matter. Will I need a colostomy? The majority of patients treated for anal cancer will not need a colostomy. If the tumor does not respond completely to combination therapy, if it recurs after treatment, or if it is an unusual type of anal cancer, removal of the rectum and anus and creation of a permanent colostomy may be necessary. This operation is known as an abdominoperineal resection (APR). What happens after treatment for anal cancer? Follow-up care to assess the results of treatment and to check for recurrence is very important. Most anal cancers are cured with combination therapy and/or surgery, so you should report any symptoms or problems to your doctor or surgeon right away. In addition, many tumors that recur may be successfully treated with surgery if they are caught early. A careful examination by an experienced physician or colon and rectal surgeon at regular intervals is the most important method of follow-up. Additional studies, such as certain types of scans (for example, CT or MRI) or ultrasounds, may also be recommended.  Conclusion Anal cancers are unusual tumors arising from the skin or lining of the anal canal. As with most cancers, early detection is associated with excellent survival. Most tumors are well-treated with combination chemotherapy and radiation. Recurrences, treatment failures, and advanced disease may require surgery. Follow the recommended screening examinations for anal and colorectal cancer and consult your doctor or colon and rectal surgeon early when any concerning symptoms occur.  What is a colon and rectal surgeon? Colon and rectal surgeons are experts in the surgical and non-surgical treatment of diseases of the colon, rectum and anus. They have completed advanced surgical training in the treatment of these diseases as well as full general surgical training.  Board-certified colon and rectal surgeons complete residencies in general surgery and colon and rectal surgery, and pass intensive examinations conducted by the American Board of Surgery and the American Board of Colon and Rectal Surgery. They are well-versed in the treatment of both benign and malignant diseases of the colon, rectum and anus and are able to perform routine screening examinations and surgically treat conditions if indicated to do so. Pryor CuriaJunious Dresser. Cristela Blue, MD, FACS, FASCRS, on behalf of the [place]

## 2014-02-16 NOTE — Progress Notes (Addendum)
No chief complaint on file.   HISTORY: Julian Palmer is a 53 y.o. male who presents to the office with a new/recurrent anal cancer.  This was found on excisional biopsy.  Workup thus far has included PET scan, which was neg for metastatic disease.  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  He developed anal cancer about 4 years ago and was treated with chemoRT with a complete response. He has lost about 10 lbs in last 2 months, which he attributes to depression.  He denies any rectal bleeding or pain.  Per pt, his HIV is well controlled and undetectable.  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    Past Medical History  Diagnosis Date  . CAD (coronary artery disease)   . HTN (hypertension)   . HLD (hyperlipidemia)   . S/P angioplasty     stent to his proximal LAD in 1998 angioplasty to the mdi to distal LAD  . Carcinoma in situ of anal canal 2010  . Diabetes mellitus     Non insulin Dependent  . Cancer October 06, 2010    invasive squamous cell carcinoma, poorly differentiated, status post radiation/chemotherapy. 9 o'clock position.      Past Surgical History  Procedure Laterality Date  . Stent (other)    . Colonoscopy  2009  . Anal polyp excision  2009  . Hernia repair  1988  . Septoplasty  1985  . Excision anal mass  2012  . Angioplasty  1998  . Sphincterotomy  2009  . Anal skin squamous cell reexcision  2010    radiation and chemotherapy  . Insertion central venous access device w/ subcutaneous port    . Rectal mass removal  01/08/14      Current Outpatient Prescriptions  Medication Sig Dispense Refill  . aspirin (ASPIR-81) 81 MG EC tablet Take 81 mg by mouth daily. Take 2 tabs       . atorvastatin (LIPITOR) 80 MG tablet Take 1 tablet (80 mg total) by mouth daily.  90 tablet  3  . citalopram (CELEXA) 20 MG tablet Take 1 tablet (20 mg total) by mouth daily.  30 tablet  5  . efavirenz-emtrictabine-tenofovir (ATRIPLA) [id number] MG per tablet Take 1  tablet by mouth daily.        Marland Kitchen glimepiride (AMARYL) 2 MG tablet Take 8 mg tablets daily.      Marland Kitchen losartan (COZAAR) 100 MG tablet Take 1 tablet (100 mg total) by mouth daily.  90 tablet  4  . metFORMIN (GLUCOPHAGE) 1000 MG tablet Take 1,000 mg by mouth 2 (two) times daily.        . Multiple Vitamins-Minerals (MULTIVITAL) tablet Take 1 tablet by mouth daily.        . niacin (NIASPAN) 1000 MG CR tablet Take 1 tablet (1,000 mg total) by mouth at bedtime. 30 minutes after aspirin  90 tablet  3  . sildenafil (VIAGRA) 100 MG tablet Take 1 tablet (100 mg total) by mouth daily as needed for erectile dysfunction.  3 tablet  10  . testosterone cypionate (DEPOTESTOTERONE CYPIONATE) 200 MG/ML injection Inject 240 mg into the muscle.      . testosterone enanthate (DELATESTRYL) 200 MG/ML injection Inject 50 mg into the muscle every 14 (fourteen) days. For IM use only      . zolpidem (AMBIEN) 10 MG tablet Take 1 tablet (10 mg total) by mouth at bedtime as needed for sleep.  30 tablet  1   No current facility-administered medications for this visit.      Allergies  Allergen Reactions  . Sulfa Antibiotics Hives  . Sulfonamide Derivatives       Family History  Problem Relation Age of Onset  . Diabetes type II Mother   . Breast cancer Mother     survivor  . Hypertension Mother   . Coronary artery disease Father       History   Social History  . Marital Status: Single    Spouse Name: N/A    Number of Children: N/A  . Years of Education: N/A   Occupational History  .  [place] History Main Topics  . Smoking status: Never Smoker   . Smokeless tobacco: None     Comment: tobacco use- no   . Alcohol Use: No     Comment: occas.   . Drug Use: No  . Sexual Activity: None   Other Topics Concern  . None   Social History Narrative   Full time. Single. Does not regularly exercise.        REVIEW OF SYSTEMS - PERTINENT POSITIVES ONLY: Review of Systems - General ROS:  negative for - chills, fever or pos for weight loss Respiratory ROS: no cough, shortness of breath, or wheezing Cardiovascular ROS: no chest pain or dyspnea on exertion Gastrointestinal ROS: no abdominal pain, change in bowel habits, or black or bloody stools Genito-Urinary ROS: no dysuria, trouble voiding, or hematuria Musculoskeletal ROS: negative for - joint pain or joint swelling  EXAM: Filed Vitals:   02/16/14 0928  BP: 136/80  Pulse: 77  Temp: 97.4 F (36.3 C)    Gen:  No acute distress.  Well nourished and well groomed.   Neurological: Alert and oriented to person, place, and time. Coordination normal.  Head: Normocephalic and atraumatic.  Eyes: Conjunctivae are normal. Pupils are equal, round, and reactive to light. No scleral icterus.  Neck: Normal range of motion. Neck supple. No tracheal deviation or thyromegaly present.  No cervical lymphadenopathy. Cardiovascular: Normal rate, regular rhythm, normal heart sounds and intact distal pulses. Respiratory: Effort normal.  No respiratory distress. No chest wall tenderness. Breath sounds normal.  No wheezes, rales or rhonchi.  GI: Soft. Bowel sounds are normal. The abdomen is soft and nontender.  There is no rebound and no guarding.  Musculoskeletal: Normal range of motion. Extremities are nontender.  Skin: Skin is warm and dry. No rash noted. No diaphoresis. No erythema. No pallor. No clubbing, cyanosis, or edema.   Psychiatric: Normal mood and affect. Behavior is normal. Judgment and thought content normal.    LABORATORY RESULTS: Pathology report reviewed   RADIOLOGY RESULTS:   Images and reports are reviewed. Available labs are reviewed PET negative for metabolically active tumor in the neck, chest, abdomen or pelvis.    Procedure: Anoscopy Surgeon: Marcello Moores Assistant: Moffit After the risks and benefits were explained, verbal consent was obtained for above procedure  Anesthesia: none Diagnosis: anal mass Findings:  surgical scar R anterior perianal region.  Radiation scarring R lateral anal canal.  No obvious tumor noted.    ASSESSMENT AND PLAN:  Julian Palmer Is a 53 year old male who presents to the office with a perianal carcinoma. This was excised in July. He has a history of anal cancer in the past and has received chemotherapy and radiation. His PET scan was negative for any focally hypermetabolic regions.  He is here today for a second  opinion.  I have reviewed the pathology report in detail. It does appear that some margins were positive. I have asked to obtain the slides from [facility] and have one of our pathologist look at them here. If the margins are truly positive, I think resection with an abdominal perineal approach would be appropriate as he cannot undergo chemotherapy and radiation. I would like to discuss this and our multidisciplinary GI tumor board next week. I will see the patient back in the office after this. We briefly discussed surgical procedures which include laparoscopic and robotic approaches. We discussed the possible need for a pedicle flap to help heal his perineal wound. We discussed return to work which could take several months if he has trouble with his perineal wound healing. We also discussed his HIV status. His greatest fear is that his family will find out about this. He is very concerned that this information will be available to them during his hospital stay. I reassured the patient that his HIV status should remain confidential and there is no reason that his family would need to know about this.   Rosario Adie, MD Colon and Rectal Surgery / [facility]      Visit Diagnoses: 1. Anal cancer     Primary Care Physician: [name], MD

## 2014-02-16 NOTE — Telephone Encounter (Signed)
Faxed documentation to [facility] to fedex pathology slides over to Dr. Donato Heinz at [facility].  Both facilities have been notified of the shipping and receiving of these slides.  Confirmation was received for faxes.

## 2014-02-19 ENCOUNTER — Ambulatory Visit (INDEPENDENT_AMBULATORY_CARE_PROVIDER_SITE_OTHER): Payer: Private Health Insurance - Indemnity | Admitting: General Surgery

## 2014-02-24 ENCOUNTER — Telehealth (INDEPENDENT_AMBULATORY_CARE_PROVIDER_SITE_OTHER): Payer: Self-pay | Admitting: General Surgery

## 2014-02-24 NOTE — Telephone Encounter (Signed)
I informed the patient of the discussion in the multidisciplinary GI tumor board today.  It was recommended that we proceed with abdominoperineal resection. The patient has asked about reexcising the scar.  I told him that this is a possibility but would not be standard of care. We could try to perform a wide local excision. I could not guarantee that this would be effective or that there are not microscopic cancer cells somewhere else. He would like to think about this further. I will see him back next week in the office.

## 2014-03-02 ENCOUNTER — Encounter (INDEPENDENT_AMBULATORY_CARE_PROVIDER_SITE_OTHER): Payer: Private Health Insurance - Indemnity | Admitting: General Surgery

## 2014-03-23 ENCOUNTER — Encounter (HOSPITAL_BASED_OUTPATIENT_CLINIC_OR_DEPARTMENT_OTHER): Payer: Self-pay | Admitting: *Deleted

## 2014-03-23 NOTE — Progress Notes (Signed)
NPO AFTER MN WITH EXCEPTION CLEAR LIQUIDS UNTIL 0800 (NO CREAM/ MILK PRODUCTS).  ARRIVE AT 1230. NEEDS ISTAT.  CURRENT EKG IN CHART AND EPIC. WILL TAKE CELEXA AM DOS W/ SIPS OF WATER.

## 2014-03-23 NOTE — Progress Notes (Signed)
03/23/14 1504  OBSTRUCTIVE SLEEP APNEA  Have you ever been diagnosed with sleep apnea through a sleep study? No  Do you snore loudly (loud enough to be heard through closed doors)?  0  Do you often feel tired, fatigued, or sleepy during the daytime? 0  Has anyone observed you stop breathing during your sleep? 0  Do you have, or are you being treated for high blood pressure? 1  BMI more than 35 kg/m2? 0  Age over 54 years old? 1  Neck circumference greater than 40 cm/16 inches? 1  Gender: 1  Obstructive Sleep Apnea Score 4  Score 4 or greater  Results sent to PCP

## 2014-03-26 ENCOUNTER — Encounter (HOSPITAL_BASED_OUTPATIENT_CLINIC_OR_DEPARTMENT_OTHER): Payer: Private Health Insurance - Indemnity | Admitting: Anesthesiology

## 2014-03-26 ENCOUNTER — Ambulatory Visit (HOSPITAL_BASED_OUTPATIENT_CLINIC_OR_DEPARTMENT_OTHER)
Admission: RE | Admit: 2014-03-26 | Discharge: 2014-03-26 | Disposition: A | Discharge status: Home / Self Care | Payer: Private Health Insurance - Indemnity | Source: Ambulatory Visit | Attending: General Surgery | Admitting: General Surgery

## 2014-03-26 ENCOUNTER — Encounter (HOSPITAL_BASED_OUTPATIENT_CLINIC_OR_DEPARTMENT_OTHER): Payer: Self-pay | Admitting: *Deleted

## 2014-03-26 ENCOUNTER — Encounter (HOSPITAL_BASED_OUTPATIENT_CLINIC_OR_DEPARTMENT_OTHER)
Admission: RE | Disposition: A | Discharge status: Home / Self Care | Payer: Self-pay | Source: Ambulatory Visit | Attending: General Surgery

## 2014-03-26 ENCOUNTER — Ambulatory Visit (HOSPITAL_BASED_OUTPATIENT_CLINIC_OR_DEPARTMENT_OTHER): Payer: Private Health Insurance - Indemnity | Admitting: Anesthesiology

## 2014-03-26 DIAGNOSIS — Z882 Allergy status to sulfonamides status: Secondary | ICD-10-CM | POA: Insufficient documentation

## 2014-03-26 DIAGNOSIS — I10 Essential (primary) hypertension: Secondary | ICD-10-CM | POA: Insufficient documentation

## 2014-03-26 DIAGNOSIS — E785 Hyperlipidemia, unspecified: Secondary | ICD-10-CM | POA: Diagnosis not present

## 2014-03-26 DIAGNOSIS — I2510 Atherosclerotic heart disease of native coronary artery without angina pectoris: Secondary | ICD-10-CM | POA: Insufficient documentation

## 2014-03-26 DIAGNOSIS — C211 Malignant neoplasm of anal canal: Secondary | ICD-10-CM | POA: Diagnosis not present

## 2014-03-26 DIAGNOSIS — C210 Malignant neoplasm of anus, unspecified: Secondary | ICD-10-CM | POA: Diagnosis present

## 2014-03-26 DIAGNOSIS — E119 Type 2 diabetes mellitus without complications: Secondary | ICD-10-CM | POA: Insufficient documentation

## 2014-03-26 DIAGNOSIS — K6282 Dysplasia of anus: Secondary | ICD-10-CM | POA: Diagnosis not present

## 2014-03-26 HISTORY — DX: Diverticulosis of large intestine without perforation or abscess without bleeding: K57.30

## 2014-03-26 HISTORY — DX: Male erectile dysfunction, unspecified: N52.9

## 2014-03-26 HISTORY — DX: Presence of spectacles and contact lenses: Z97.3

## 2014-03-26 HISTORY — DX: Personal history of other malignant neoplasm of rectum, rectosigmoid junction, and anus: Z85.048

## 2014-03-26 HISTORY — DX: Personal history of in-situ neoplasm of other and unspecified digestive organs: Z86.004

## 2014-03-26 HISTORY — DX: Other specified personal risk factors, not elsewhere classified: Z91.89

## 2014-03-26 HISTORY — DX: Malignant neoplasm of anus, unspecified: C21.0

## 2014-03-26 HISTORY — DX: Essential (primary) hypertension: I10

## 2014-03-26 HISTORY — DX: Malignant neoplasm of anal canal: C21.1

## 2014-03-26 HISTORY — DX: Nausea with vomiting, unspecified: R11.2

## 2014-03-26 HISTORY — DX: Presence of coronary angioplasty implant and graft: Z95.5

## 2014-03-26 HISTORY — DX: Atherosclerotic heart disease of native coronary artery without angina pectoris: I25.10

## 2014-03-26 HISTORY — DX: Hyperlipidemia, unspecified: E78.5

## 2014-03-26 HISTORY — PX: MASS EXCISION: SHX2000

## 2014-03-26 HISTORY — DX: Type 2 diabetes mellitus without complications: E11.9

## 2014-03-26 HISTORY — DX: Other specified postprocedural states: Z98.890

## 2014-03-26 LAB — POCT I-STAT, CHEM 8
BUN: 21 mg/dL (ref 6–23)
CHLORIDE: 103 meq/L (ref 96–112)
Calcium, Ion: 1.19 mmol/L (ref 1.12–1.23)
Creatinine, Ser: 0.9 mg/dL (ref 0.50–1.35)
GLUCOSE: 157 mg/dL — AB (ref 70–99)
HCT: 41 % (ref 39.0–52.0)
Hemoglobin: 13.9 g/dL (ref 13.0–17.0)
POTASSIUM: 3.9 meq/L (ref 3.7–5.3)
Sodium: 139 mEq/L (ref 137–147)
TCO2: 26 mmol/L (ref 0–100)

## 2014-03-26 LAB — GLUCOSE, CAPILLARY: GLUCOSE-CAPILLARY: 231 mg/dL — AB (ref 70–99)

## 2014-03-26 SURGERY — EXCISION MASS
Anesthesia: General | Site: Rectum

## 2014-03-26 MED ORDER — FENTANYL CITRATE 0.05 MG/ML IJ SOLN
INTRAMUSCULAR | Status: AC
Start: 2014-03-26 — End: 2014-03-26
  Filled 2014-03-26: qty 6

## 2014-03-26 MED ORDER — MEPERIDINE HCL 25 MG/ML IJ SOLN
6.2500 mg | INTRAMUSCULAR | Status: DC | PRN
  Filled 2014-03-26: qty 1

## 2014-03-26 MED ORDER — ACETAMINOPHEN 650 MG RE SUPP
650.0000 mg | RECTAL | Status: DC | PRN
  Filled 2014-03-26: qty 1

## 2014-03-26 MED ORDER — HYDROMORPHONE HCL 1 MG/ML IJ SOLN
0.2500 mg | INTRAMUSCULAR | Status: DC | PRN
  Filled 2014-03-26: qty 1

## 2014-03-26 MED ORDER — OXYCODONE HCL 5 MG PO TABS
ORAL_TABLET | ORAL | Status: AC
  Filled 2014-03-26: qty 1

## 2014-03-26 MED ORDER — PROPOFOL INFUSION 10 MG/ML OPTIME
INTRAVENOUS | Status: DC | PRN
  Administered 2014-03-26: 200 ug/kg/min via INTRAVENOUS

## 2014-03-26 MED ORDER — OXYCODONE HCL 5 MG/5ML PO SOLN
5.0000 mg | Freq: Once | ORAL | Status: AC | PRN
  Filled 2014-03-26: qty 5

## 2014-03-26 MED ORDER — BUPIVACAINE-EPINEPHRINE 0.5% -1:200000 IJ SOLN
INTRAMUSCULAR | Status: DC | PRN
  Administered 2014-03-26: 30 mL

## 2014-03-26 MED ORDER — HYDROCODONE-ACETAMINOPHEN 5-325 MG PO TABS: 1.0000 | ORAL_TABLET | ORAL | Status: DC | PRN

## 2014-03-26 MED ORDER — OXYCODONE HCL 5 MG PO TABS
5.0000 mg | ORAL_TABLET | ORAL | Status: DC | PRN
  Filled 2014-03-26: qty 2

## 2014-03-26 MED ORDER — KETAMINE HCL 50 MG/ML IJ SOLN
INTRAMUSCULAR | Status: AC
  Filled 2014-03-26: qty 10

## 2014-03-26 MED ORDER — SODIUM CHLORIDE 0.9 % IJ SOLN
3.0000 mL | INTRAMUSCULAR | Status: DC | PRN
  Filled 2014-03-26: qty 3

## 2014-03-26 MED ORDER — SODIUM CHLORIDE 0.9 % IV SOLN
250.0000 mL | INTRAVENOUS | Status: DC | PRN
  Filled 2014-03-26: qty 250

## 2014-03-26 MED ORDER — SODIUM CHLORIDE 0.9 % IJ SOLN
3.0000 mL | Freq: Two times a day (BID) | INTRAMUSCULAR | Status: DC
  Filled 2014-03-26: qty 3

## 2014-03-26 MED ORDER — LIDOCAINE HCL (CARDIAC) 20 MG/ML IV SOLN
INTRAVENOUS | Status: DC | PRN
  Administered 2014-03-26: 50 mg via INTRAVENOUS

## 2014-03-26 MED ORDER — FENTANYL CITRATE 0.05 MG/ML IJ SOLN
INTRAMUSCULAR | Status: DC | PRN
Start: 2014-03-26 — End: 2014-03-29
  Administered 2014-03-26: 50 ug via INTRAVENOUS
  Administered 2014-03-26 (×3): 25 ug via INTRAVENOUS

## 2014-03-26 MED ORDER — ACETAMINOPHEN 325 MG PO TABS
650.0000 mg | ORAL_TABLET | ORAL | Status: DC | PRN
  Filled 2014-03-26: qty 2

## 2014-03-26 MED ORDER — MIDAZOLAM HCL 2 MG/2ML IJ SOLN
INTRAMUSCULAR | Status: AC
  Filled 2014-03-26: qty 2

## 2014-03-26 MED ORDER — SODIUM CHLORIDE 0.9 % IV SOLN
500.0000 mg | INTRAVENOUS | Status: DC | PRN
  Administered 2014-03-26: 10 ug/kg/min via INTRAVENOUS

## 2014-03-26 MED ORDER — PROMETHAZINE HCL 25 MG/ML IJ SOLN
6.2500 mg | INTRAMUSCULAR | Status: DC | PRN
  Filled 2014-03-26: qty 1

## 2014-03-26 MED ORDER — MIDAZOLAM HCL 5 MG/5ML IJ SOLN
INTRAMUSCULAR | Status: DC | PRN
  Administered 2014-03-26: 2 mg via INTRAVENOUS

## 2014-03-26 MED ORDER — OXYCODONE HCL 5 MG PO TABS
5.0000 mg | ORAL_TABLET | Freq: Once | ORAL | Status: AC | PRN
  Administered 2014-03-26: 5 mg via ORAL
  Filled 2014-03-26: qty 1

## 2014-03-26 MED ORDER — LACTATED RINGERS IV SOLN
INTRAVENOUS | Status: DC
  Administered 2014-03-26 (×2): via INTRAVENOUS
  Filled 2014-03-26: qty 1000

## 2014-03-26 MED ORDER — LIDOCAINE 5 % EX OINT
TOPICAL_OINTMENT | CUTANEOUS | Status: DC | PRN
  Administered 2014-03-26: 1

## 2014-03-26 SURGICAL SUPPLY — 52 items
APL SKNCLS STERI-STRIP NONHPOA (GAUZE/BANDAGES/DRESSINGS) ×1
BENZOIN TINCTURE PRP APPL 2/3 (GAUZE/BANDAGES/DRESSINGS) ×3 IMPLANT
BLADE HEX COATED 2.75 (ELECTRODE) ×3 IMPLANT
BLADE SURG 10 STRL SS (BLADE) IMPLANT
BLADE SURG 15 STRL LF DISP TIS (BLADE) ×1 IMPLANT
BLADE SURG 15 STRL SS (BLADE) ×3
BRIEF STRETCH FOR OB PAD LRG (UNDERPADS AND DIAPERS) ×3 IMPLANT
CANISTER SUCTION 2500CC (MISCELLANEOUS) ×3 IMPLANT
CLOTH BEACON ORANGE TIMEOUT ST (SAFETY) ×3 IMPLANT
COVER MAYO STAND STRL (DRAPES) ×3 IMPLANT
COVER TABLE BACK 60X90 (DRAPES) ×3 IMPLANT
DECANTER SPIKE VIAL GLASS SM (MISCELLANEOUS) IMPLANT
DRAPE LG THREE QUARTER DISP (DRAPES) ×6 IMPLANT
DRAPE PED LAPAROTOMY (DRAPES) ×3 IMPLANT
DRAPE UNDERBUTTOCKS STRL (DRAPE) IMPLANT
DRAPE UTILITY XL STRL (DRAPES) ×3 IMPLANT
DRSG PAD ABDOMINAL 8X10 ST (GAUZE/BANDAGES/DRESSINGS) ×3 IMPLANT
ELECT BLADE 6.5 .24CM SHAFT (ELECTRODE) IMPLANT
ELECT REM PT RETURN 9FT ADLT (ELECTROSURGICAL) ×3
ELECTRODE REM PT RTRN 9FT ADLT (ELECTROSURGICAL) ×1 IMPLANT
GAUZE SPONGE 4X4 16PLY XRAY LF (GAUZE/BANDAGES/DRESSINGS) IMPLANT
GAUZE VASELINE 3X9 (GAUZE/BANDAGES/DRESSINGS) IMPLANT
GLOVE BIO SURGEON STRL SZ 6.5 (GLOVE) ×6 IMPLANT
GLOVE BIO SURGEONS STRL SZ 6.5 (GLOVE) ×3
GLOVE INDICATOR 6.5 STRL GRN (GLOVE) ×3 IMPLANT
GLOVE INDICATOR 7.0 STRL GRN (GLOVE) ×6 IMPLANT
GOWN L4 XXLG W/PAP TWL (GOWN DISPOSABLE) ×3 IMPLANT
GOWN STRL REUS W/ TWL XL LVL3 (GOWN DISPOSABLE) ×1 IMPLANT
GOWN STRL REUS W/TWL XL LVL3 (GOWN DISPOSABLE) ×2
LEGGING LITHOTOMY PAIR STRL (DRAPES) IMPLANT
LOOP VESSEL MAXI BLUE (MISCELLANEOUS) IMPLANT
NDL SAFETY ECLIPSE 18X1.5 (NEEDLE) IMPLANT
NEEDLE HYPO 18GX1.5 SHARP (NEEDLE)
NEEDLE HYPO 25X1 1.5 SAFETY (NEEDLE) ×3 IMPLANT
NS IRRIG 500ML POUR BTL (IV SOLUTION) ×3 IMPLANT
PACK BASIN DAY SURGERY FS (CUSTOM PROCEDURE TRAY) ×3 IMPLANT
PAD ABD 8X10 STRL (GAUZE/BANDAGES/DRESSINGS) IMPLANT
PENCIL BUTTON HOLSTER BLD 10FT (ELECTRODE) ×3 IMPLANT
SPONGE GAUZE 4X4 12PLY STER LF (GAUZE/BANDAGES/DRESSINGS) ×3 IMPLANT
SPONGE SURGIFOAM ABS GEL 12-7 (HEMOSTASIS) IMPLANT
SUT CHROMIC 2 0 SH (SUTURE) ×3 IMPLANT
SUT CHROMIC 3 0 SH 27 (SUTURE) ×3 IMPLANT
SUT ETHIBOND 0 (SUTURE) IMPLANT
SUT VIC AB 4-0 P-3 18XBRD (SUTURE) IMPLANT
SUT VIC AB 4-0 P3 18 (SUTURE)
SYR BULB IRRIGATION 50ML (SYRINGE) ×3 IMPLANT
SYR CONTROL 10ML LL (SYRINGE) ×3 IMPLANT
TOWEL OR 17X24 6PK STRL BLUE (TOWEL DISPOSABLE) ×6 IMPLANT
TRAY DSU PREP LF (CUSTOM PROCEDURE TRAY) ×3 IMPLANT
TUBE CONNECTING 12'X1/4 (SUCTIONS) ×1
TUBE CONNECTING 12X1/4 (SUCTIONS) ×2 IMPLANT
YANKAUER SUCT BULB TIP NO VENT (SUCTIONS) ×3 IMPLANT

## 2014-03-26 NOTE — Anesthesia Preprocedure Evaluation (Addendum)
Anesthesia Evaluation  Patient identified by MRN, date of birth, ID band Patient awake    Reviewed: Allergy & Precautions, H&P , NPO status , Patient's Chart, lab work & pertinent test results  History of Anesthesia Complications (+) PONV and history of anesthetic complications  Airway Mallampati: II TM Distance: >3 FB Neck ROM: Full    Dental no notable dental hx.    Pulmonary neg pulmonary ROS,  breath sounds clear to auscultation  Pulmonary exam normal       Cardiovascular hypertension, Pt. on medications + CAD and + Cardiac Stents Rhythm:Regular Rate:Normal     Neuro/Psych negative neurological ROS  negative psych ROS   GI/Hepatic negative GI ROS, Neg liver ROS,   Endo/Other  diabetes, Type 2, Oral Hypoglycemic Agents  Renal/GU negative Renal ROS     Musculoskeletal negative musculoskeletal ROS (+)   Abdominal   Peds  Hematology negative hematology ROS (+)   Anesthesia Other Findings   Reproductive/Obstetrics negative OB ROS                          Anesthesia Physical Anesthesia Plan  ASA: III  Anesthesia Plan: General   Post-op Pain Management:    Induction: Intravenous  Airway Management Planned: Oral ETT and LMA  Additional Equipment:   Intra-op Plan:   Post-operative Plan: Extubation in OR  Informed Consent: I have reviewed the patients History and Physical, chart, labs and discussed the procedure including the risks, benefits and alternatives for the proposed anesthesia with the patient or authorized representative who has indicated his/her understanding and acceptance.   Dental advisory given  Plan Discussed with: CRNA  Anesthesia Plan Comments:         Anesthesia Quick Evaluation

## 2014-03-26 NOTE — H&P (Signed)
HISTORY:  Julian Palmer is a 53 y.o. male who presents to the office with a new/recurrent anal cancer. This was found on excisional biopsy. Workup thus far has included PET scan, which was neg for metastatic disease. 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 He developed anal cancer about 4 years ago and was treated with chemoRT with a complete response. He has lost about 10 lbs in last 2 months, which he attributes to depression. He denies any rectal bleeding or pain. Per pt, his HIV is well controlled and undetectable. 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  Past Medical History   Diagnosis  Date   .  CAD (coronary artery disease)    .  HTN (hypertension)    .  HLD (hyperlipidemia)    .  S/P angioplasty      stent to his proximal LAD in 1998 angioplasty to the mdi to distal LAD   .  Carcinoma in situ of anal canal  2010   .  Diabetes mellitus      Non insulin Dependent   .  Cancer  October 06, 2010     invasive squamous cell carcinoma, poorly differentiated, status post radiation/chemotherapy. 9 o'clock position.    Past Surgical History   Procedure  Laterality  Date   .  Stent (other)     .  Colonoscopy   2009   .  Anal polyp excision   2009   .  Hernia repair   1988   .  Septoplasty   1985   .  Excision anal mass   2012   .  Angioplasty   1998   .  Sphincterotomy   2009   .  Anal skin squamous cell reexcision   2010     radiation and chemotherapy   .  Insertion central venous access device w/ subcutaneous port     .  Rectal mass removal   01/08/14    Current Outpatient Prescriptions   Medication  Sig  Dispense  Refill   .  aspirin (ASPIR-81) 81 MG EC tablet  Take 81 mg by mouth daily. Take 2 tabs     .  atorvastatin (LIPITOR) 80 MG tablet  Take 1 tablet (80 mg total) by mouth daily.  90 tablet  3   .  citalopram (CELEXA) 20 MG tablet  Take 1 tablet (20 mg total) by mouth daily.  30 tablet  5   .  efavirenz-emtrictabine-tenofovir (ATRIPLA) [phone] MG  per tablet  Take 1 tablet by mouth daily.     Marland Kitchen  glimepiride (AMARYL) 2 MG tablet  Take 8 mg tablets daily.     Marland Kitchen  losartan (COZAAR) 100 MG tablet  Take 1 tablet (100 mg total) by mouth daily.  90 tablet  4   .  metFORMIN (GLUCOPHAGE) 1000 MG tablet  Take 1,000 mg by mouth 2 (two) times daily.     .  Multiple Vitamins-Minerals (MULTIVITAL) tablet  Take 1 tablet by mouth daily.     .  niacin (NIASPAN) 1000 MG CR tablet  Take 1 tablet (1,000 mg total) by mouth at bedtime. 30 minutes after aspirin  90 tablet  3   .  sildenafil (VIAGRA) 100 MG tablet  Take 1 tablet (100 mg total) by mouth daily as needed for erectile dysfunction.  3 tablet  10   .  testosterone cypionate (DEPOTESTOTERONE CYPIONATE) 200 MG/ML injection  Inject  240 mg into the muscle.     .  testosterone enanthate (DELATESTRYL) 200 MG/ML injection  Inject 50 mg into the muscle every 14 (fourteen) days. For IM use only     .  zolpidem (AMBIEN) 10 MG tablet  Take 1 tablet (10 mg total) by mouth at bedtime as needed for sleep.  30 tablet  1    No current facility-administered medications for this visit.    Allergies   Allergen  Reactions   .  Sulfa Antibiotics  Hives   .  Sulfonamide Derivatives     Family History   Problem  Relation  Age of Onset   .  Diabetes type II  Mother    .  Breast cancer  Mother      survivor   .  Hypertension  Mother    .  Coronary artery disease  Father     History    Social History   .  Marital Status:  Single     Spouse Name:  N/A     Number of Children:  N/A   .  Years of Education:  N/A    Occupational History   .   [place] History Main Topics   .  Smoking status:  Never Smoker   .  Smokeless tobacco:  None      Comment: tobacco use- no   .  Alcohol Use:  No      Comment: occas.   .  Drug Use:  No   .  Sexual Activity:  None    Other Topics  Concern   .  None    Social History Narrative    Full time. Single. Does not regularly exercise.   REVIEW OF  SYSTEMS - PERTINENT POSITIVES ONLY:  Review of Systems - General ROS: negative for - chills, fever or pos for weight loss  Respiratory ROS: no cough, shortness of breath, or wheezing  Cardiovascular ROS: no chest pain or dyspnea on exertion  Gastrointestinal ROS: no abdominal pain, change in bowel habits, or black or bloody stools  Genito-Urinary ROS: no dysuria, trouble voiding, or hematuria  Musculoskeletal ROS: negative for - joint pain or joint swelling  EXAM:  BP 153/84  Pulse 82  Temp(Src) 98.1 F (36.7 C) (Oral)  Resp 16  Ht 5\' 11"  (1.803 m)  Wt 209 lb (94.802 kg)  BMI 29.16 kg/m2  SpO2 98% Gen: No acute distress. Well nourished and well groomed.  Neurological: Alert and oriented to person, place, and time. Coordination normal.  Head: Normocephalic and atraumatic.  Eyes: Conjunctivae are normal. Pupils are equal, round, and reactive to light. No scleral icterus.  Neck: Normal range of motion. Neck supple. No tracheal deviation or thyromegaly present. No cervical lymphadenopathy.  Cardiovascular: Normal rate, regular rhythm, normal heart sounds and intact distal pulses.  Respiratory: Effort normal. No respiratory distress. No chest wall tenderness. Breath sounds normal. No wheezes, rales or rhonchi.  GI: Soft. Bowel sounds are normal. The abdomen is soft and nontender. There is no rebound and no guarding.  Musculoskeletal: Normal range of motion. Extremities are nontender.  Skin: Skin is warm and dry. No rash noted. No diaphoresis. No erythema. No pallor. No clubbing, cyanosis, or edema.  Psychiatric: Normal mood and affect. Behavior is normal. Judgment and thought content normal.  LABORATORY RESULTS:  Pathology report reviewed  RADIOLOGY RESULTS:  Images and reports are reviewed.  Available labs are reviewed PET negative for metabolically active  tumor in the neck, chest, abdomen or pelvis.  Procedure: Anoscopy  Surgeon: Marcello Moores  Assistant: Moffit  After the risks and  benefits were explained, verbal consent was obtained for above procedure  Anesthesia: none  Diagnosis: anal mass  Findings: surgical scar R anterior perianal region. Radiation scarring R lateral anal canal. No obvious tumor noted.  ASSESSMENT AND PLAN:  Jerzy Roepke Parker Is a 53 year old male who presents to the office with a perianal carcinoma. This was excised in July. He has a history of anal cancer in the past and has received chemotherapy and radiation. His PET scan was negative for any focally hypermetabolic regions. He is here today for a second opinion. I have reviewed the pathology report in detail. It does appear that some margins were positive. I have asked to obtain the slides from [facility] and have one of our pathologist look at them here. The margins are truly positive. I think resection with an abdominal perineal approach would be appropriate as he cannot undergo chemotherapy and radiation.  We discussed that standard of care would be APR.  He is very opposed to this.  I offered to repeat his excisional biopsy.  He was in favor of this.  He understands that if the biopsy is positive, we would need to do an APR.  He understands that if the biopsy is negative, that does not mean that he is necessarily cancer free.  We discussed that this treatment is not standard of care for anal cancer.  We also discussed his HIV status. His greatest fear is that his family will find out about this. He is very concerned that this information will be available to them during his hospital stay. I reassured the patient that his HIV status should remain confidential and there is no reason that his family would need to know about this.  Rosario Adie, MD  Colon and Rectal Surgery / [facility]

## 2014-03-26 NOTE — Discharge Instructions (Addendum)
ANORECTAL SURGERY: POST OP INSTRUCTIONS °1. Take your usually prescribed home medications unless otherwise directed. °2. DIET: During the first few hours after surgery sip on some liquids until you are able to urinate.  It is normal to not urinate for several hours after this surgery.  If you feel uncomfortable, please contact the office for instructions.  After you are able to urinate,you may eat, if you feel like it.  Follow a light bland diet the first 24 hours after arrival home, such as soup, liquids, crackers, etc.  Be sure to include lots of fluids daily (6-8 glasses).  Avoid fast food or heavy meals, as your are more likely to get nauseated.  Eat a low fat diet the next few days after surgery.  Limit caffeine intake to 1-2 servings a day. °3. PAIN CONTROL: °a. Pain is best controlled by a usual combination of several different methods TOGETHER: °i. Muscle relaxation °1.  Soak in a warm bath (or Sitz bath) three times a day and after bowel movements.  Continue to do this until all pain is resolved. °2. Take the muscle relaxer (Valium) every 6 hours for the first 2 days after surgery  °ii. Over the counter pain medication °iii. Prescription pain medication °b. Most patients will experience some swelling and discomfort in the anus/rectal area and incisions.  Heat such as warm towels, sitz baths, warm baths, etc to help relax tight/sore spots and speed recovery.  Some people prefer to use ice, especially in the first couple days after surgery, as it may decrease the pain and swelling, or alternate between ice & heat.  Experiment to what works for you.  Swelling and bruising can take several weeks to resolve.  Pain can take even longer to completely resolve. °c. It is helpful to take an over-the-counter pain medication regularly for the first few weeks.  Choose one of the following that works best for you: °i. Naproxen (Aleve, etc)  Two 220mg tabs twice a day °ii. Ibuprofen (Advil, etc) Three 200mg tabs four  times a day (every meal & bedtime) °d. A  prescription for pain medication (such as percocet, oxycodone, hydrocodone, etc) should be given to you upon discharge.  Take your pain medication as prescribed.  °i. If you are having problems/concerns with the prescription medicine (does not control pain, nausea, vomiting, rash, itching, etc), please call us [phone] to see if we need to switch you to a different pain medicine that will work better for you and/or control your side effect better. °ii. If you need a refill on your pain medication, please contact your pharmacy.  They will contact our office to request authorization. Prescriptions will not be filled after 5 pm or on week-ends. °4. KEEP YOUR BOWELS REGULAR and AVOID CONSTIPATION °a. The goal is one to two soft bowel movements a day.  You should at least have a bowel movement every other day. °b. Avoid getting constipated.  Between the surgery and the pain medications, it is common to experience some constipation. This can be very painful after rectal surgery.  Increasing fluid intake and taking a fiber supplement (such as Metamucil, Citrucel, FiberCon, etc) 1-2 times a day regularly will usually help prevent this problem from occurring.  A stool softener like colace is also recommended.  This can be purchased over the counter at your pharmacy.  You can take it up to 3 times a day.  If you do not have a bowel movement after 24 hrs since your surgery,   take one does of milk of magnesia.  If you still haven't had a bowel movement 8-12 hours after that dose, take another dose.  If you don't have a bowel movement 48 hrs after surgery, purchase a Fleets enema from the drug store and administer gently per package instructions.  If you still are having trouble with your bowel movements after that, please call the office for further instructions. °c. If you develop diarrhea or have many loose bowel movements, simplify your diet to bland foods & liquids for a few  days.  Stop any stool softeners and decrease your fiber supplement.  Switching to mild anti-diarrheal medications (Kayopectate, Pepto Bismol) can help.  If this worsens or does not improve, please call us. ° °5. Wound Care °a. Remove your bandages before your first bowel movement or 8 hours after surgery.     °b. Remove any wound packing material at this tim,e as well.  You do not need to repack the wound unless instructed otherwise.  Wear an absorbent pad or soft cotton gauze in your underwear to catch any drainage and help keep the area clean. You should change this every 2-3 hours while awake. °c. Keep the area clean and dry.  Bathe / shower every day, especially after bowel movements.  Keep the area clean by showering / bathing over the incision / wound.   It is okay to soak an open wound to help wash it.  Wet wipes or showers / gentle washing after bowel movements is often less traumatic than regular toilet paper. °d. You may have some styrofoam-like soft packing in the rectum which will come out with the first bowel movement.  °e. You will often notice bleeding with bowel movements.  This should slow down by the end of the first week of surgery °f. Expect some drainage.  This should slow down, too, by the end of the first week of surgery.  Wear an absorbent pad or soft cotton gauze in your underwear until the drainage stops. °g. Do Not sit on a rubber or pillow ring.  This can make you symptoms worse.  You may sit on a soft pillow if needed.  °6. ACTIVITIES as tolerated:   °a. You may resume regular (light) daily activities beginning the next day--such as daily self-care, walking, climbing stairs--gradually increasing activities as tolerated.  If you can walk 30 minutes without difficulty, it is safe to try more intense activity such as jogging, treadmill, bicycling, low-impact aerobics, swimming, etc. °b. Save the most intensive and strenuous activity for last such as sit-ups, heavy lifting, contact sports,  etc  Refrain from any heavy lifting or straining until you are off narcotics for pain control.   °c. You may drive when you are no longer taking prescription pain medication, you can comfortably sit for long periods of time, and you can safely maneuver your car and apply brakes. °d. You may have sexual intercourse when it is comfortable.  °7. FOLLOW UP in our office °a. Please call [facility] at [phone] to set up an appointment to see your surgeon in the office for a follow-up appointment approximately 3-4 weeks after your surgery. °b. Make sure that you call for this appointment the day you arrive home to insure a convenient appointment time. °10. IF YOU HAVE DISABILITY OR FAMILY LEAVE FORMS, BRING THEM TO THE OFFICE FOR PROCESSING.  DO NOT GIVE THEM TO YOUR DOCTOR. ° ° ° ° °WHEN TO CALL US [phone]: °1. Poor pain control °  2. Reactions / problems with new medications (rash/itching, nausea, etc)  °3. Fever over 101.5 F (38.5 C) °4. Inability to urinate °5. Nausea and/or vomiting °6. Worsening swelling or bruising °7. Continued bleeding from incision. °8. Increased pain, redness, or drainage from the incision ° °The clinic staff is available to answer your questions during regular business hours (8:30am-5pm).  Please don’t hesitate to call and ask to speak to one of our nurses for clinical concerns.   A surgeon from [facility] is always on call at the hospitals °  °If you have a medical emergency, go to the nearest emergency room or call 911. °  ° °[facility] °[address], [address], [place], [place]  [address] ? °MAIN: [phone] ? TOLL FREE: [phone] ? °FAX [phone] °[email or web address] ° ° ° ° °Post Anesthesia Home Care Instructions ° °Activity: °Get plenty of rest for the remainder of the day. A responsible adult should stay with you for 24 hours following the procedure.  °For the next 24 hours, DO NOT: °-Drive a car °-Operate  machinery °-Drink alcoholic beverages °-Take any medication unless instructed by your physician °-Make any legal decisions or sign important papers. ° °Meals: °Start with liquid foods such as gelatin or soup. Progress to regular foods as tolerated. Avoid greasy, spicy, heavy foods. If nausea and/or vomiting occur, drink only clear liquids until the nausea and/or vomiting subsides. Call your physician if vomiting continues. ° °Special Instructions/Symptoms: °Your throat may feel dry or sore from the anesthesia or the breathing tube placed in your throat during surgery. If this causes discomfort, gargle with warm salt water. The discomfort should disappear within 24 hours. ° °

## 2014-03-26 NOTE — Op Note (Signed)
03/26/2014  2:42 PM  PATIENT:  Julian Palmer  53 y.o. male  Patient Care Team: [name], MD as PCP - General (Infectious Diseases) Robert Bellow, MD (General Surgery)  PRE-OPERATIVE DIAGNOSIS:  Anal Cancer  POST-OPERATIVE DIAGNOSIS:  Anal Cancer  PROCEDURE:  ANAL EXAM UNDER ANESTHESIA,EXCISIONAL BIOPSY OF ANAL MASS  SURGEON:  Surgeon(s): Leighton Ruff, MD  ASSISTANT: none   ANESTHESIA:   local and MAC  SPECIMEN:  Source of Specimen:  anal canal mass, re-excision of perianal scar  DISPOSITION OF SPECIMEN:  PATHOLOGY  COUNTS:  YES  PLAN OF CARE: Discharge to home after PACU  PATIENT DISPOSITION:  PACU - hemodynamically stable.  INDICATION: This is a 53 y.o. M with a h/o anal cancer and a new perianal mass.  Previous excision of perianal mass showed SCC to the margin.  We are here today to re-excise the scar and evaluate the previous anal cancer site.    OR FINDINGS: R lateral anal canal mass and perianal scar  DESCRIPTION: the patient was identified in the preoperative holding area and taken to the OR where they were laid on the operating room table.  MAC anesthesia was induced without difficulty. The patient was then positioned in prone jackknife position with buttocks gently taped apart.  The patient was then prepped and draped in usual sterile fashion.  SCDs were noted to be in place prior to the initiation of anesthesia. A surgical timeout was performed indicating the correct patient, procedure, positioning and need for preoperative antibiotics.  I began with a digital rectal exam.  There was a R lateral anal mass palpated consistent with his previous treatment.  I then placed a Hill-Ferguson anoscope into the anal canal and evaluated this completely.  I took a biopsy to confirm this was only scar and not recurrence.  This was labeled as anal canal mass.  I then marked a 34mm margin on each side of the right anterior surgical scar.  This was excised to the  subcutaneous level with a scalpel.  I used cautery to remove from the underlying tissue.  This was marked on a cork board with margin labels and sent to the pathologist for further evaluation.  I then closed the anal canal portion using a 3-0 Chromic and the skin with a 2-0 Chromic.  A rectal block was completed using marcaine with epinephrine.  All counts were correct per OR staff.  The patient was awakened from anesthesia and sent to the PACU in stable condition.

## 2014-03-29 ENCOUNTER — Encounter (HOSPITAL_BASED_OUTPATIENT_CLINIC_OR_DEPARTMENT_OTHER): Payer: Self-pay | Admitting: General Surgery

## 2014-03-29 NOTE — Transfer of Care (Signed)
Immediate Anesthesia Transfer of Care Note  Patient: Julian Palmer  Procedure(s) Performed: Procedure(s): ANAL EXAM UNDER ANESTHESIA,EXCISIONAL BIOPSY OF ANAL MASS (N/A)  Patient Location: PACU  Anesthesia Type:MAC  Level of Consciousness: awake, alert , oriented and patient cooperative  Airway & Oxygen Therapy: Patient Spontanous Breathing and Patient connected to face mask oxygen  Post-op Assessment: Report given to PACU RN and Post -op Vital signs reviewed and stable  Post vital signs: Reviewed and stable  Complications: No apparent anesthesia complications

## 2014-03-31 ENCOUNTER — Telehealth (INDEPENDENT_AMBULATORY_CARE_PROVIDER_SITE_OTHER): Payer: Self-pay | Admitting: General Surgery

## 2014-03-31 NOTE — Telephone Encounter (Signed)
Discussed path results with pt (Recurrent SCC of the anal canal).  Will need APR.  He would like to discuss TRAM flap with Dr Iran Planas.

## 2014-04-06 ENCOUNTER — Other Ambulatory Visit (INDEPENDENT_AMBULATORY_CARE_PROVIDER_SITE_OTHER): Payer: Self-pay | Admitting: General Surgery

## 2014-04-06 NOTE — Anesthesia Postprocedure Evaluation (Signed)
  Anesthesia Post-op Note  Patient: Julian Palmer  Procedure(s) Performed: Procedure(s) (LRB): ANAL EXAM UNDER ANESTHESIA,EXCISIONAL BIOPSY OF ANAL MASS (N/A)  Patient Location: PACU  Anesthesia Type: MAC  Level of Consciousness: awake and alert   Airway and Oxygen Therapy: Patient Spontanous Breathing  Post-op Pain: mild  Post-op Assessment: Post-op Vital signs reviewed, Patient's Cardiovascular Status Stable, Respiratory Function Stable, Patent Airway and No signs of Nausea or vomiting  Last Vitals:  Filed Vitals:   03/26/14 1700  BP: 139/84  Pulse: 69  Temp: 37.1 C  Resp: 18    Post-op Vital Signs: stable   Complications: No apparent anesthesia complications

## 2014-04-07 NOTE — H&P (Signed)
  Subjective:   Patient ID: Julian Palmer is a 53 y.o. male.  HPI  Referred by Dr. Marcello Moores for consultation for VRAM for anticipated APR. Patient initially diagnosed with SCC anus about 4 years ago from external lesionand was treated with chemoRT completed 2013 at [facility] a complete response. Represented this summer with internal palpable lesion. Workup thus far has included PET scan, which was neg for metastatic disease. Has attempted local resection with continued positive margins and APR recommended. Wt reported to be up to 20 lb down over last few months due to depression of diagnosis; has regained some of this. Is using Boost supplementation currently. He denies any rectal bleeding or pain. 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 [facility]. Pt is anxious as family is unaware of this diagnosis. Oral control for BM; last Hb A1c 7.2.   Only abdominal surgery is BIHR. PMHx also significant for MI and stent placement. On ASA for this.   Review of Systems  Constitutional: Positive for unexpected weight change.  Psychiatric/Behavioral: Positive for dysphoric mood. The patient is nervous/anxious.  All other systems reviewed and are negative.    Objective:   Physical Exam  Constitutional: He is oriented to person, place, and time.  Pulmonary/Chest: Effort normal and breath sounds normal.  Abdominal: Soft.  No hernias appreciated  Neurological: He is alert and oriented to person, place, and time.    Assessment:    Recurrent anal cancer  Post chemo XRT  HIV   Plan:    Reviewed harvest skin muscle of right hemi abdomen and use for perineal reconstruction.Risks including but not limited to bleeding, seroma, DVT/PE, cardiopulmonary complications, need for additional surgery, failure flap, wound healing problems reviewed, hernia, abdominal bulge. Counseled that aim of VRAM in setting of prior XRT is to  limit major complications including major wound healing/dehiscence problems, peritoneal infection. However wound healing problems are common and will require several weeks to heal.  Reviewed hospital stay, drains, post procedure limitations. Encouraged to continue protein supplementation in anticipation surgery and make plan following hospital regarding friends/family to help at home - reports he will be staying with sister.   Irene Limbo, MD [facility] [phone]

## 2014-04-09 ENCOUNTER — Encounter (HOSPITAL_COMMUNITY): Payer: Self-pay | Admitting: Pharmacy Technician

## 2014-04-14 ENCOUNTER — Encounter (HOSPITAL_COMMUNITY): Payer: Self-pay

## 2014-04-15 NOTE — Progress Notes (Signed)
[place]audie Flakes on [phone] regarding preop appt on 10/23 at 0800am.

## 2014-04-15 NOTE — Patient Instructions (Addendum)
Julian Palmer  04/15/2014   Your procedure is scheduled on:  04/22/2014    Report to [facility].  Follow the Signs to [facility] at  0700      am  Call this number if you have problems the morning of surgery: [phone]   Remember:   Do not eat food or drink liquids after midnight.   Take these medicines the morning of surgery with A SIP OF WATER:none     Do not wear jewelry,   Do not wear lotions, powders, or perfumes. r deodorant.  . Men may shave face and neck.  Do not bring valuables to the hospital.  Contacts, dentures or bridgework may not be worn into surgery.  Leave suitcase in the car. After surgery it may be brought to your room.  For patients admitted to the hospital, checkout time is 11:00 AM the day of  discharge.       Please read over the following fact sheets that you were given: [facility] - Preparing for Surgery Before surgery, you can play an important role.  Because skin is not sterile, your skin needs to be as free of germs as possible.  You can reduce the number of germs on your skin by washing with CHG (chlorahexidine gluconate) soap before surgery.  CHG is an antiseptic cleaner which kills germs and bonds with the skin to continue killing germs even after washing. Please DO NOT use if you have an allergy to CHG or antibacterial soaps.  If your skin becomes reddened/irritated stop using the CHG and inform your nurse when you arrive at Short Stay. Do not shave (including legs and underarms) for at least 48 hours prior to the first CHG shower.  You may shave your face/neck. Please follow these instructions carefully:  1.  Shower with CHG Soap the night before surgery and the  morning of Surgery.  2.  If you choose to wash your hair, wash your hair first as usual with your  normal  shampoo.  3.  After you shampoo, rinse your hair and body thoroughly to remove the  shampoo.                           4.  Use CHG as you would any other liquid  soap.  You can apply chg directly  to the skin and wash                       Gently with a scrungie or clean washcloth.  5.  Apply the CHG Soap to your body ONLY FROM THE NECK DOWN.   Do not use on face/ open                           Wound or open sores. Avoid contact with eyes, ears mouth and genitals (private parts).                       Wash face,  Genitals (private parts) with your normal soap.             6.  Wash thoroughly, paying special attention to the area where your surgery  will be performed.  7.  Thoroughly rinse your body with warm water from the neck down.  8.  DO NOT shower/wash with your normal soap after using and rinsing off  the CHG Soap.                9.  Pat yourself dry with a clean towel.            10.  Wear clean pajamas.            11.  Place clean sheets on your bed the night of your first shower and do not  sleep with pets. Day of Surgery : Do not apply any lotions/deodorants the morning of surgery.  Please wear clean clothes to the hospital/surgery center.  FAILURE TO FOLLOW THESE INSTRUCTIONS MAY RESULT IN THE CANCELLATION OF YOUR SURGERY PATIENT SIGNATURE_________________________________  NURSE SIGNATURE__________________________________  ________________________________________________________________________  WHAT IS A BLOOD TRANSFUSION? Blood Transfusion Information  A transfusion is the replacement of blood or some of its parts. Blood is made up of multiple cells which provide different functions.  Red blood cells carry oxygen and are used for blood loss replacement.  White blood cells fight against infection.  Platelets control bleeding.  Plasma helps clot blood.  Other blood products are available for specialized needs, such as hemophilia or other clotting disorders. BEFORE THE TRANSFUSION  Who gives blood for transfusions?   Healthy volunteers who are fully evaluated to make sure their blood is safe. This is blood bank  blood. Transfusion therapy is the safest it has ever been in the practice of medicine. Before blood is taken from a donor, a complete history is taken to make sure that person has no history of diseases nor engages in risky social behavior (examples are intravenous drug use or sexual activity with multiple partners). The donor's travel history is screened to minimize risk of transmitting infections, such as malaria. The donated blood is tested for signs of infectious diseases, such as HIV and hepatitis. The blood is then tested to be sure it is compatible with you in order to minimize the chance of a transfusion reaction. If you or a relative donates blood, this is often done in anticipation of surgery and is not appropriate for emergency situations. It takes many days to process the donated blood. RISKS AND COMPLICATIONS Although transfusion therapy is very safe and saves many lives, the main dangers of transfusion include:   Getting an infectious disease.  Developing a transfusion reaction. This is an allergic reaction to something in the blood you were given. Every precaution is taken to prevent this. The decision to have a blood transfusion has been considered carefully by your caregiver before blood is given. Blood is not given unless the benefits outweigh the risks. AFTER THE TRANSFUSION  Right after receiving a blood transfusion, you will usually feel much better and more energetic. This is especially true if your red blood cells have gotten low (anemic). The transfusion raises the level of the red blood cells which carry oxygen, and this usually causes an energy increase.  The nurse administering the transfusion will monitor you carefully for complications. HOME CARE INSTRUCTIONS  No special instructions are needed after a transfusion. You may find your energy is better. Speak with your caregiver about any limitations on activity for underlying diseases you may have. SEEK MEDICAL CARE IF:    Your condition is not improving after your transfusion.  You develop redness or irritation at the intravenous (IV) site. SEEK IMMEDIATE MEDICAL CARE IF:  Any of the following symptoms occur over the next 12 hours:  Shaking chills.  You have a temperature by mouth above 102 F (38.9 C), not controlled by  medicine.  Chest, back, or muscle pain.  People around you feel you are not acting correctly or are confused.  Shortness of breath or difficulty breathing.  Dizziness and fainting.  You get a rash or develop hives.  You have a decrease in urine output.  Your urine turns a dark color or changes to pink, red, or brown. Any of the following symptoms occur over the next 10 days:  You have a temperature by mouth above 102 F (38.9 C), not controlled by medicine.  Shortness of breath.  Weakness after normal activity.  The white part of the eye turns yellow (jaundice).  You have a decrease in the amount of urine or are urinating less often.  Your urine turns a dark color or changes to pink, red, or brown. Document Released: 06/08/2000 Document Revised: 09/03/2011 Document Reviewed: 01/26/2008 ExitCare Patient Information 2014 [employer].  _______________________________________________________________________, coughing and deep breathing exercises, leg exercises

## 2014-04-15 NOTE — Progress Notes (Signed)
Paged Maudie Flakes on [phone] .

## 2014-04-16 ENCOUNTER — Encounter (HOSPITAL_COMMUNITY)
Admission: RE | Admit: 2014-04-16 | Discharge: 2014-04-16 | Disposition: A | Discharge status: Home / Self Care | Payer: Private Health Insurance - Indemnity | Source: Ambulatory Visit | Attending: General Surgery | Admitting: General Surgery

## 2014-04-16 ENCOUNTER — Encounter (HOSPITAL_COMMUNITY): Payer: Self-pay

## 2014-04-16 ENCOUNTER — Ambulatory Visit (HOSPITAL_COMMUNITY)
Admission: RE | Admit: 2014-04-16 | Discharge: 2014-04-16 | Disposition: A | Discharge status: Home / Self Care | Payer: Private Health Insurance - Indemnity | Source: Ambulatory Visit | Attending: General Surgery | Admitting: General Surgery

## 2014-04-16 DIAGNOSIS — Z955 Presence of coronary angioplasty implant and graft: Secondary | ICD-10-CM | POA: Diagnosis not present

## 2014-04-16 DIAGNOSIS — Z933 Colostomy status: Secondary | ICD-10-CM | POA: Insufficient documentation

## 2014-04-16 DIAGNOSIS — C20 Malignant neoplasm of rectum: Secondary | ICD-10-CM | POA: Diagnosis not present

## 2014-04-16 DIAGNOSIS — E669 Obesity, unspecified: Secondary | ICD-10-CM | POA: Diagnosis not present

## 2014-04-16 DIAGNOSIS — I10 Essential (primary) hypertension: Secondary | ICD-10-CM

## 2014-04-16 DIAGNOSIS — B20 Human immunodeficiency virus [HIV] disease: Secondary | ICD-10-CM | POA: Insufficient documentation

## 2014-04-16 HISTORY — DX: Asymptomatic human immunodeficiency virus (hiv) infection status: Z21

## 2014-04-16 HISTORY — DX: Major depressive disorder, single episode, unspecified: F32.9

## 2014-04-16 HISTORY — DX: Anxiety disorder, unspecified: F41.9

## 2014-04-16 HISTORY — DX: Depression, unspecified: F32.A

## 2014-04-16 LAB — CBC
HCT: 39.7 % (ref 39.0–52.0)
Hemoglobin: 13.8 g/dL (ref 13.0–17.0)
MCH: 31.2 pg (ref 26.0–34.0)
MCHC: 34.8 g/dL (ref 30.0–36.0)
MCV: 89.6 fL (ref 78.0–100.0)
PLATELETS: 217 10*3/uL (ref 150–400)
RBC: 4.43 MIL/uL (ref 4.22–5.81)
RDW: 13.4 % (ref 11.5–15.5)
WBC: 7.2 10*3/uL (ref 4.0–10.5)

## 2014-04-16 LAB — BASIC METABOLIC PANEL
Anion gap: 14 (ref 5–15)
BUN: 26 mg/dL — AB (ref 6–23)
CO2: 25 mEq/L (ref 19–32)
Calcium: 9.8 mg/dL (ref 8.4–10.5)
Chloride: 96 mEq/L (ref 96–112)
Creatinine, Ser: 0.93 mg/dL (ref 0.50–1.35)
Glucose, Bld: 177 mg/dL — ABNORMAL HIGH (ref 70–99)
POTASSIUM: 4.4 meq/L (ref 3.7–5.3)
Sodium: 135 mEq/L — ABNORMAL LOW (ref 137–147)

## 2014-04-16 LAB — HEMOGLOBIN A1C
Hgb A1c MFr Bld: 8.7 % — ABNORMAL HIGH (ref <5.7)
MEAN PLASMA GLUCOSE: 203 mg/dL — AB (ref <117)

## 2014-04-16 NOTE — Consult Note (Signed)
WOC ostomy consult note Preoperative stoma site selection visit performed per Dr. Marcello Moores' request.  DOS is Thursday, 04/22/14.  A LLQ site is selected after assessing patient's abdomen in the sitting and standing positions. A mildly obese and soft abdomen is noted.  Patient wears his boxer-brief style underwear low on the abdomen and his belt and waistband of slacks at the umbilicus.  He is anxious, depressed (he reports having a psychiatrist for support and treatment of this at this time), tearful at times during our visit but also composed and able to express his questions.  Patient lives alone and had made arrangements to live with his sister for several weeks following discharge from the hospital. He indicated that she and other family members do not know of his HIV diagnosis and that he does not wish for them to know. I assured him of our ability to keep all of his protected health information confidential. Julian Palmer  has conducted extensive investigations into ostomy patient's lives and supplies via the Internet, has joined virtual support groups and even purchased supplies prior to today's visit.  I have advised that he retain the receipts for those purchases in the event some or all of the products are not needed.  Stoma site selected is 6cm to the left of the umbilicus and 1cm below in the LLQ. Julian Palmer is made using a surgical site marking pen and is covered with a thin film transparent dressing. Education provided: Extended session to cover all of patient's pre-existing questions and to provide additional information pertaining to pouching, ADLs, diet. Pouching samples provided and demonstrated, particularly Lock N' Roll closure feature of pouching system.  Discussed colostomy irrigation at length with patient and indicated we can discus further in the post-operative phase. I have provided instructional materials and a DVD for his continuing education prior to surgery as well as my contact information so  that he can receive answers to any questions that arise in the next week prior to surgery that fall within the realm of [place] nursing. The [place] team looks forward to following this patient along with you to assist in ostomy education and stoma management. Thank you for the consultation request for this nice gentleman., Maudie Flakes, MSN, RN, [place], [place], [place] [phone])

## 2014-04-16 NOTE — Progress Notes (Signed)
BMp results faxed via EPIC to Dr Marcello Moores and Dr Iran Planas.

## 2014-04-19 NOTE — Progress Notes (Signed)
2v CXR and EKG done 04/16/2014 in EPIc.   HgA1C done 04/16/2014 faxed via EPIC to DR Thimmappa and Dr Marcello Moores.

## 2014-04-20 ENCOUNTER — Ambulatory Visit (INDEPENDENT_AMBULATORY_CARE_PROVIDER_SITE_OTHER): Payer: Private Health Insurance - Indemnity | Admitting: Cardiovascular Disease

## 2014-04-20 ENCOUNTER — Telehealth (INDEPENDENT_AMBULATORY_CARE_PROVIDER_SITE_OTHER): Payer: Self-pay | Admitting: General Surgery

## 2014-04-20 ENCOUNTER — Encounter: Payer: Self-pay | Admitting: Cardiovascular Disease

## 2014-04-20 ENCOUNTER — Encounter (HOSPITAL_COMMUNITY): Payer: Self-pay

## 2014-04-20 VITALS — BP 130/90 | HR 67 | Ht 71.0 in | Wt 212.5 lb

## 2014-04-20 DIAGNOSIS — I2510 Atherosclerotic heart disease of native coronary artery without angina pectoris: Secondary | ICD-10-CM

## 2014-04-20 DIAGNOSIS — C211 Malignant neoplasm of anal canal: Secondary | ICD-10-CM

## 2014-04-20 DIAGNOSIS — E785 Hyperlipidemia, unspecified: Secondary | ICD-10-CM

## 2014-04-20 DIAGNOSIS — I10 Essential (primary) hypertension: Secondary | ICD-10-CM

## 2014-04-20 DIAGNOSIS — E1165 Type 2 diabetes mellitus with hyperglycemia: Secondary | ICD-10-CM

## 2014-04-20 DIAGNOSIS — IMO0002 Reserved for concepts with insufficient information to code with codable children: Secondary | ICD-10-CM

## 2014-04-20 NOTE — Patient Instructions (Addendum)
Your next appointment will be scheduled in our new office located at :  [facility]  [address] [place], [place] [address]  You are doing well. No medication changes were made.  Please call us if you have new issues that need to be addressed before your next appt.  Your physician wants you to follow-up in: 6 months.  You will receive a reminder letter in the mail two months in advance. If you don't receive a letter, please call our office to schedule the follow-up appointment.

## 2014-04-20 NOTE — Progress Notes (Signed)
Patient ID: Julian Palmer, male    DOB: Jul 27, 1960, 52 y.o.   MRN: [id number]  HPI Comments: Julian Palmer is a very pleasant 53 year old gentleman with a history of coronary artery disease, stent placed to his proximal LAD in 1998 with angioplasty to the mid and distal LAD, diabetes, hypertension who presents for routine followup.    History of rectal cancer, s/p resection on February 05 2011, followed by radiation of more than 30 cycles, chemotherapy for 2 weeks. He reports significant blistering, particularly in his groin area from the radiation.  In followup today, he reports he is scheduled for rectal surgery for recurrent cancer in 2 days' time at [facility]  by Dr. Marcello Moores . He had surgical evaluation 03/26/2014 that did not show clear margins unfortunately . He's had difficulty adjusting, has seen the psychologist, psychiatry, is on antidepressants . Difficulty sleeping, on medications for sleep   sugars have climbed with hemoglobin A1c 8.7 up from 7.4 Denies having any cardiac issues, no shortness of breath or chest pain concerning for ischemia He has stopped work for the remainder of the year  he has stopped his testosterone   Cardiac catheterization report from 1998 shows a totally occluded LAD after the first septal branch. Otherwise no other significant coronary artery disease. Balloon angioplasty was performed of the proximal mid LAD, stent placed to the proximal LAD with a 3.5 x 15 mm Multi-Link stent. Distal LAD was then angioplastied   Stress test in April 2010 was low risk scan, where he achieved 10 METS, with a very small region of mildly reduced perfusion is mildly reversible in the distal LAD territory  Recent lab work shows total cholesterol 153, LDL 56, HDL 43, normal LFTs, creatinine 0.9, hematocrit 40, as mentioned hemoglobin A1c 7.4  EKG shows normal sinus rhythm with no significant ST or T wave changes, old anteroseptal infarct   Outpatient Encounter  Prescriptions as of 04/20/2014  Medication Sig  . acetaminophen (TYLENOL) 500 MG tablet Take 1,000 mg by mouth every 6 (six) hours as needed for mild pain or moderate pain.   Marland Kitchen aspirin (ASPIR-81) 81 MG EC tablet Take 81 mg by mouth daily.   Marland Kitchen atorvastatin (LIPITOR) 80 MG tablet Take 80 mg by mouth every evening.  . citalopram (CELEXA) 40 MG tablet Take 40 mg by mouth every morning.  Marland Kitchen efavirenz-emtrictabine-tenofovir (ATRIPLA) 600-200-300 MG per tablet Take 1 tablet by mouth at bedtime.   Marland Kitchen glimepiride (AMARYL) 2 MG tablet Take 4 mg by mouth 2 (two) times daily.   Marland Kitchen HYDROcodone-acetaminophen (NORCO/VICODIN) 5-325 MG per tablet Take 1-2 tablets by mouth every 4 (four) hours as needed.  Marland Kitchen losartan (COZAAR) 100 MG tablet Take 100 mg by mouth every evening.  . metFORMIN (GLUCOPHAGE) 1000 MG tablet Take 1,000 mg by mouth 2 (two) times daily.    . Multiple Vitamins-Minerals (MULTIVITAL) tablet Take 1 tablet by mouth every evening.   . niacin (NIASPAN) 1000 MG CR tablet Take 1,000 mg by mouth at bedtime.  . traZODone (DESYREL) 100 MG tablet Take 200 mg by mouth at bedtime.    Review of Systems  Constitutional: Negative.   HENT: Negative.   Eyes: Negative.   Respiratory: Negative.   Cardiovascular: Negative.   Gastrointestinal: Negative.   Endocrine: Negative.   Genitourinary:       Erectile dysfunction  Musculoskeletal: Negative.   Skin: Negative.   Allergic/Immunologic: Negative.   Neurological: Negative.   Hematological: Negative.   Psychiatric/Behavioral: Negative.  All other systems reviewed and are negative.   BP 130/90  Pulse 67  Ht 5\' 11"  (1.803 m)  Wt 212 lb 8 oz (96.389 kg)  BMI 29.65 kg/m2  Physical Exam  Nursing note and vitals reviewed. Constitutional: He is oriented to person, place, and time. He appears well-developed and well-nourished.  HENT:  Head: Normocephalic.  Nose: Nose normal.  Mouth/Throat: Oropharynx is clear and moist.  Eyes: Conjunctivae are  normal. Pupils are equal, round, and reactive to light.  Neck: Normal range of motion. Neck supple. No JVD present.  Cardiovascular: Normal rate, regular rhythm, S1 normal, S2 normal, normal heart sounds and intact distal pulses.  Exam reveals no gallop and no friction rub.   No murmur heard. Pulmonary/Chest: Effort normal and breath sounds normal. No respiratory distress. He has no wheezes. He has no rales. He exhibits no tenderness.  Abdominal: Soft. Bowel sounds are normal. He exhibits no distension. There is no tenderness.  Musculoskeletal: Normal range of motion. He exhibits no edema and no tenderness.  Lymphadenopathy:    He has no cervical adenopathy.  Neurological: He is alert and oriented to person, place, and time. Coordination normal.  Skin: Skin is warm and dry. No rash noted. No erythema.  Psychiatric: He has a normal mood and affect. His behavior is normal. Judgment and thought content normal.      Assessment and Plan

## 2014-04-20 NOTE — Assessment & Plan Note (Signed)
Blood pressure is well controlled on today's visit. No changes made to the medications. 

## 2014-04-20 NOTE — Assessment & Plan Note (Signed)
Recommended that he stay on his Lipitor

## 2014-04-20 NOTE — Assessment & Plan Note (Signed)
Currently with no symptoms of angina. No further workup at this time. Continue current medication regimen. 

## 2014-04-20 NOTE — Assessment & Plan Note (Signed)
We have encouraged continued exercise, careful diet management in an effort to lose weight. 

## 2014-04-20 NOTE — Progress Notes (Signed)
LOV with Dr Ida Rogue 04/20/2014 in EPIC.

## 2014-04-20 NOTE — Telephone Encounter (Signed)
Message copied by Flossie Buffy on Tue Apr 20, 2014 10:51 AM ------      Message from: [place], Gifford Shave.      Created: Sat Apr 17, 2014  7:40 AM       Please have him see his PCP prior to surgery about his HgbA1c (8.3).  He can minimize his post op complications if he gets his blood sugars under better control.            thx      AT ------

## 2014-04-20 NOTE — Assessment & Plan Note (Signed)
Scheduled for surgery in 2 days' time. Acceptable risk with no further cardiac testing needed. Wished him good luck to the surgery

## 2014-04-20 NOTE — Telephone Encounter (Signed)
Called patient to inform him of the message below.  Informed him that if he couldn't get an appt with his PCP then he should at least try and speak with him over the phone in regards to his HgbA1C.

## 2014-04-21 NOTE — Anesthesia Preprocedure Evaluation (Signed)
Anesthesia Evaluation  Patient identified by MRN, date of birth, ID band Patient awake    Reviewed: Allergy & Precautions, H&P , NPO status , Patient's Chart, lab work & pertinent test results  History of Anesthesia Complications (+) PONV and history of anesthetic complications  Airway Mallampati: II  TM Distance: >3 FB Neck ROM: Full    Dental no notable dental hx.    Pulmonary neg pulmonary ROS,  breath sounds clear to auscultation  Pulmonary exam normal       Cardiovascular hypertension, Pt. on medications + CAD and + Cardiac Stents Rhythm:Regular Rate:Normal     Neuro/Psych PSYCHIATRIC DISORDERS Anxiety Depression negative neurological ROS     GI/Hepatic negative GI ROS, Neg liver ROS,   Endo/Other  diabetes, Type 2, Oral Hypoglycemic Agents  Renal/GU negative Renal ROS     Musculoskeletal negative musculoskeletal ROS (+)   Abdominal   Peds  Hematology negative hematology ROS (+)   Anesthesia Other Findings   Reproductive/Obstetrics negative OB ROS                             Anesthesia Physical Anesthesia Plan  ASA: III  Anesthesia Plan: General   Post-op Pain Management:    Induction: Intravenous  Airway Management Planned: Oral ETT  Additional Equipment:   Intra-op Plan:   Post-operative Plan: Extubation in OR  Informed Consent: I have reviewed the patients History and Physical, chart, labs and discussed the procedure including the risks, benefits and alternatives for the proposed anesthesia with the patient or authorized representative who has indicated his/her understanding and acceptance.   Dental advisory given  Plan Discussed with: CRNA  Anesthesia Plan Comments: (2 x PIV)        Anesthesia Quick Evaluation

## 2014-04-22 ENCOUNTER — Encounter (HOSPITAL_COMMUNITY): Payer: Private Health Insurance - Indemnity | Admitting: Certified Registered"

## 2014-04-22 ENCOUNTER — Inpatient Hospital Stay (HOSPITAL_COMMUNITY)
Admission: RE | Admit: 2014-04-22 | Discharge: 2014-04-29 | DRG: 331 | Disposition: A | Discharge status: Home / Self Care | Payer: Private Health Insurance - Indemnity | Source: Ambulatory Visit | Attending: General Surgery | Admitting: General Surgery

## 2014-04-22 ENCOUNTER — Encounter (HOSPITAL_COMMUNITY)
Admission: RE | Disposition: A | Discharge status: Home / Self Care | Payer: Self-pay | Source: Ambulatory Visit | Attending: General Surgery

## 2014-04-22 ENCOUNTER — Encounter (HOSPITAL_COMMUNITY): Payer: Self-pay | Admitting: Registered Nurse

## 2014-04-22 ENCOUNTER — Inpatient Hospital Stay (HOSPITAL_COMMUNITY): Payer: Private Health Insurance - Indemnity | Admitting: Certified Registered"

## 2014-04-22 DIAGNOSIS — E119 Type 2 diabetes mellitus without complications: Secondary | ICD-10-CM | POA: Diagnosis present

## 2014-04-22 DIAGNOSIS — Z6829 Body mass index (BMI) 29.0-29.9, adult: Secondary | ICD-10-CM

## 2014-04-22 DIAGNOSIS — I10 Essential (primary) hypertension: Secondary | ICD-10-CM | POA: Diagnosis present

## 2014-04-22 DIAGNOSIS — C210 Malignant neoplasm of anus, unspecified: Secondary | ICD-10-CM | POA: Diagnosis present

## 2014-04-22 DIAGNOSIS — I2510 Atherosclerotic heart disease of native coronary artery without angina pectoris: Secondary | ICD-10-CM | POA: Diagnosis present

## 2014-04-22 DIAGNOSIS — F329 Major depressive disorder, single episode, unspecified: Secondary | ICD-10-CM | POA: Diagnosis present

## 2014-04-22 DIAGNOSIS — I252 Old myocardial infarction: Secondary | ICD-10-CM | POA: Diagnosis not present

## 2014-04-22 DIAGNOSIS — Z21 Asymptomatic human immunodeficiency virus [HIV] infection status: Secondary | ICD-10-CM | POA: Diagnosis present

## 2014-04-22 DIAGNOSIS — Z7982 Long term (current) use of aspirin: Secondary | ICD-10-CM

## 2014-04-22 DIAGNOSIS — Z923 Personal history of irradiation: Secondary | ICD-10-CM

## 2014-04-22 DIAGNOSIS — E785 Hyperlipidemia, unspecified: Secondary | ICD-10-CM | POA: Diagnosis present

## 2014-04-22 DIAGNOSIS — Z79899 Other long term (current) drug therapy: Secondary | ICD-10-CM

## 2014-04-22 HISTORY — PX: LAPAROSCOPIC ASSISTED ABDOMINAL PERINEAL RESECTION: SHX5888

## 2014-04-22 HISTORY — PX: URETHROTOMY: SHX1083

## 2014-04-22 HISTORY — PX: MUSCLE FLAP CLOSURE: SHX2054

## 2014-04-22 LAB — GLUCOSE, CAPILLARY
GLUCOSE-CAPILLARY: 291 mg/dL — AB (ref 70–99)
Glucose-Capillary: 241 mg/dL — ABNORMAL HIGH (ref 70–99)
Glucose-Capillary: 295 mg/dL — ABNORMAL HIGH (ref 70–99)

## 2014-04-22 LAB — TYPE AND SCREEN
ABO/RH(D): A POS
Antibody Screen: NEGATIVE

## 2014-04-22 LAB — ABO/RH: ABO/RH(D): A POS

## 2014-04-22 LAB — MRSA PCR SCREENING: MRSA BY PCR: INVALID — AB

## 2014-04-22 SURGERY — RESECTION, ABDOMINOPERINEAL, LAPAROSCOPIC
Anesthesia: General | Site: Abdomen

## 2014-04-22 MED ORDER — INSULIN ASPART 100 UNIT/ML ~~LOC~~ SOLN: 0.0000 [IU] | Freq: Three times a day (TID) | SUBCUTANEOUS | Status: DC

## 2014-04-22 MED ORDER — SODIUM CHLORIDE 0.9 % IJ SOLN: 9.0000 mL | INTRAMUSCULAR | Status: DC | PRN

## 2014-04-22 MED ORDER — CEFOTETAN DISODIUM 2 G IJ SOLR
2.0000 g | Freq: Two times a day (BID) | INTRAMUSCULAR | Status: AC
  Administered 2014-04-22: 2 g via INTRAVENOUS
  Filled 2014-04-22: qty 2

## 2014-04-22 MED ORDER — LACTATED RINGERS IV SOLN
INTRAVENOUS | Status: DC
  Administered 2014-04-22: 1000 mL via INTRAVENOUS

## 2014-04-22 MED ORDER — LIDOCAINE HCL (CARDIAC) 20 MG/ML IV SOLN
INTRAVENOUS | Status: AC
  Filled 2014-04-22: qty 5

## 2014-04-22 MED ORDER — DIPHENHYDRAMINE HCL 12.5 MG/5ML PO ELIX: 12.5000 mg | ORAL_SOLUTION | Freq: Four times a day (QID) | ORAL | Status: DC | PRN

## 2014-04-22 MED ORDER — MIDAZOLAM HCL 2 MG/2ML IJ SOLN
INTRAMUSCULAR | Status: AC
  Filled 2014-04-22: qty 2

## 2014-04-22 MED ORDER — BUPIVACAINE-EPINEPHRINE 0.25% -1:200000 IJ SOLN
INTRAMUSCULAR | Status: DC | PRN
  Administered 2014-04-22: 10 mL

## 2014-04-22 MED ORDER — LOSARTAN POTASSIUM 50 MG PO TABS
100.0000 mg | ORAL_TABLET | Freq: Every evening | ORAL | Status: DC
  Administered 2014-04-22 – 2014-04-29 (×7): 100 mg via ORAL
  Filled 2014-04-22 (×8): qty 2

## 2014-04-22 MED ORDER — PROPOFOL 10 MG/ML IV BOLUS
INTRAVENOUS | Status: AC
  Filled 2014-04-22: qty 20

## 2014-04-22 MED ORDER — SODIUM CHLORIDE 0.9 % IJ SOLN
INTRAMUSCULAR | Status: AC
  Filled 2014-04-22: qty 10

## 2014-04-22 MED ORDER — MEPERIDINE HCL 50 MG/ML IJ SOLN: 6.2500 mg | INTRAMUSCULAR | Status: DC | PRN

## 2014-04-22 MED ORDER — GLIMEPIRIDE 4 MG PO TABS
4.0000 mg | ORAL_TABLET | Freq: Two times a day (BID) | ORAL | Status: DC
  Administered 2014-04-23: 4 mg via ORAL
  Filled 2014-04-22 (×3): qty 1

## 2014-04-22 MED ORDER — CEFOTETAN DISODIUM-DEXTROSE 2-2.08 GM-% IV SOLR
INTRAVENOUS | Status: AC
  Filled 2014-04-22: qty 50

## 2014-04-22 MED ORDER — SUFENTANIL CITRATE 50 MCG/ML IV SOLN
INTRAVENOUS | Status: DC | PRN
  Administered 2014-04-22 (×2): 15 ug via INTRAVENOUS
  Administered 2014-04-22: 25 ug via INTRAVENOUS
  Administered 2014-04-22: 15 ug via INTRAVENOUS
  Administered 2014-04-22: 25 ug via INTRAVENOUS
  Administered 2014-04-22: 15 ug via INTRAVENOUS
  Administered 2014-04-22: 25 ug via INTRAVENOUS
  Administered 2014-04-22 (×2): 10 ug via INTRAVENOUS
  Administered 2014-04-22: 25 ug via INTRAVENOUS
  Administered 2014-04-22 (×2): 10 ug via INTRAVENOUS
  Administered 2014-04-22: 15 ug via INTRAVENOUS

## 2014-04-22 MED ORDER — ACETAMINOPHEN 500 MG PO TABS
1000.0000 mg | ORAL_TABLET | Freq: Four times a day (QID) | ORAL | Status: AC
  Administered 2014-04-22 – 2014-04-23 (×4): 1000 mg via ORAL
  Filled 2014-04-22 (×4): qty 2

## 2014-04-22 MED ORDER — 0.9 % SODIUM CHLORIDE (POUR BTL) OPTIME
TOPICAL | Status: DC | PRN
  Administered 2014-04-22: 2000 mL

## 2014-04-22 MED ORDER — INSULIN ASPART 100 UNIT/ML ~~LOC~~ SOLN
0.0000 [IU] | Freq: Every day | SUBCUTANEOUS | Status: DC
  Administered 2014-04-22: 3 [IU] via SUBCUTANEOUS

## 2014-04-22 MED ORDER — ONDANSETRON HCL 4 MG/2ML IJ SOLN: 4.0000 mg | Freq: Four times a day (QID) | INTRAMUSCULAR | Status: DC | PRN

## 2014-04-22 MED ORDER — SUFENTANIL CITRATE 50 MCG/ML IV SOLN
INTRAVENOUS | Status: AC
  Filled 2014-04-22: qty 1

## 2014-04-22 MED ORDER — CITALOPRAM HYDROBROMIDE 40 MG PO TABS
40.0000 mg | ORAL_TABLET | Freq: Every morning | ORAL | Status: DC
  Administered 2014-04-23 – 2014-04-29 (×7): 40 mg via ORAL
  Filled 2014-04-22 (×7): qty 1

## 2014-04-22 MED ORDER — EFAVIRENZ-EMTRICITAB-TENOFOVIR 600-200-300 MG PO TABS
1.0000 | ORAL_TABLET | Freq: Every day | ORAL | Status: DC
  Administered 2014-04-22 – 2014-04-28 (×7): 1 via ORAL
  Filled 2014-04-22 (×8): qty 1

## 2014-04-22 MED ORDER — PROMETHAZINE HCL 25 MG/ML IJ SOLN: 6.2500 mg | INTRAMUSCULAR | Status: DC | PRN

## 2014-04-22 MED ORDER — MIDAZOLAM HCL 5 MG/5ML IJ SOLN
INTRAMUSCULAR | Status: DC | PRN
  Administered 2014-04-22 (×2): 2 mg via INTRAVENOUS
  Administered 2014-04-22: 1 mg via INTRAVENOUS
  Administered 2014-04-22: 2 mg via INTRAVENOUS

## 2014-04-22 MED ORDER — BUPIVACAINE-EPINEPHRINE 0.25% -1:200000 IJ SOLN
INTRAMUSCULAR | Status: AC
  Filled 2014-04-22: qty 1

## 2014-04-22 MED ORDER — SCOPOLAMINE 1 MG/3DAYS TD PT72
1.0000 | MEDICATED_PATCH | TRANSDERMAL | Status: DC
  Administered 2014-04-22: 1.5 mg via TRANSDERMAL
  Filled 2014-04-22: qty 1

## 2014-04-22 MED ORDER — INSULIN ASPART 100 UNIT/ML ~~LOC~~ SOLN
SUBCUTANEOUS | Status: AC
  Filled 2014-04-22: qty 1

## 2014-04-22 MED ORDER — ONDANSETRON HCL 4 MG/2ML IJ SOLN
INTRAMUSCULAR | Status: AC
  Filled 2014-04-22: qty 2

## 2014-04-22 MED ORDER — DEXAMETHASONE SODIUM PHOSPHATE 10 MG/ML IJ SOLN
INTRAMUSCULAR | Status: AC
  Filled 2014-04-22: qty 1

## 2014-04-22 MED ORDER — MORPHINE SULFATE (PF) 1 MG/ML IV SOLN
INTRAVENOUS | Status: AC
  Filled 2014-04-22: qty 25

## 2014-04-22 MED ORDER — OXYCODONE HCL 5 MG/5ML PO SOLN: 5.0000 mg | Freq: Once | ORAL | Status: DC | PRN

## 2014-04-22 MED ORDER — ROCURONIUM BROMIDE 100 MG/10ML IV SOLN
INTRAVENOUS | Status: AC
  Filled 2014-04-22: qty 1

## 2014-04-22 MED ORDER — ACETAMINOPHEN 10 MG/ML IV SOLN
1000.0000 mg | Freq: Once | INTRAVENOUS | Status: DC
  Filled 2014-04-22: qty 100

## 2014-04-22 MED ORDER — ALVIMOPAN 12 MG PO CAPS
12.0000 mg | ORAL_CAPSULE | Freq: Once | ORAL | Status: AC
  Administered 2014-04-22: 12 mg via ORAL
  Filled 2014-04-22: qty 1

## 2014-04-22 MED ORDER — NALOXONE HCL 0.4 MG/ML IJ SOLN: 0.4000 mg | INTRAMUSCULAR | Status: DC | PRN

## 2014-04-22 MED ORDER — METHYLENE BLUE 1 % INJ SOLN
INTRAMUSCULAR | Status: AC
  Filled 2014-04-22: qty 10

## 2014-04-22 MED ORDER — PHENYLEPHRINE HCL 10 MG/ML IJ SOLN
INTRAMUSCULAR | Status: DC | PRN
  Administered 2014-04-22: 40 ug via INTRAVENOUS
  Administered 2014-04-22 (×2): 80 ug via INTRAVENOUS

## 2014-04-22 MED ORDER — HYDROMORPHONE HCL 1 MG/ML IJ SOLN
INTRAMUSCULAR | Status: DC | PRN
  Administered 2014-04-22 (×2): 1 mg via INTRAVENOUS
  Administered 2014-04-22: 2 mg via INTRAVENOUS

## 2014-04-22 MED ORDER — LACTATED RINGERS IR SOLN
Status: DC | PRN
  Administered 2014-04-22: 1000 mL

## 2014-04-22 MED ORDER — LACTATED RINGERS IV SOLN
INTRAVENOUS | Status: DC | PRN
  Administered 2014-04-22 (×6): via INTRAVENOUS

## 2014-04-22 MED ORDER — ROCURONIUM BROMIDE 100 MG/10ML IV SOLN
INTRAVENOUS | Status: AC
  Filled 2014-04-22: qty 2

## 2014-04-22 MED ORDER — INSULIN ASPART 100 UNIT/ML ~~LOC~~ SOLN
SUBCUTANEOUS | Status: DC | PRN
  Administered 2014-04-22: 5 [IU] via INTRAVENOUS

## 2014-04-22 MED ORDER — HEPARIN SODIUM (PORCINE) 5000 UNIT/ML IJ SOLN
INTRAMUSCULAR | Status: AC
  Filled 2014-04-22: qty 1

## 2014-04-22 MED ORDER — HYDROMORPHONE HCL 1 MG/ML IJ SOLN
0.2500 mg | INTRAMUSCULAR | Status: DC | PRN
  Administered 2014-04-22 (×3): 0.5 mg via INTRAVENOUS

## 2014-04-22 MED ORDER — HYDROMORPHONE HCL 1 MG/ML IJ SOLN
INTRAMUSCULAR | Status: AC
  Filled 2014-04-22: qty 1

## 2014-04-22 MED ORDER — OXYCODONE HCL 5 MG PO TABS: 5.0000 mg | ORAL_TABLET | Freq: Once | ORAL | Status: DC | PRN

## 2014-04-22 MED ORDER — LACTATED RINGERS IV SOLN: INTRAVENOUS | Status: DC

## 2014-04-22 MED ORDER — HYDROMORPHONE HCL 2 MG/ML IJ SOLN
INTRAMUSCULAR | Status: AC
  Filled 2014-04-22: qty 1

## 2014-04-22 MED ORDER — TRAZODONE HCL 100 MG PO TABS
200.0000 mg | ORAL_TABLET | Freq: Every day | ORAL | Status: DC
  Administered 2014-04-22 – 2014-04-28 (×7): 200 mg via ORAL
  Filled 2014-04-22 (×4): qty 2
  Filled 2014-04-22: qty 4
  Filled 2014-04-22 (×3): qty 2

## 2014-04-22 MED ORDER — ONDANSETRON HCL 4 MG/2ML IJ SOLN
INTRAMUSCULAR | Status: DC | PRN
  Administered 2014-04-22: 4 mg via INTRAVENOUS

## 2014-04-22 MED ORDER — LACTATED RINGERS IV SOLN
INTRAVENOUS | Status: DC
  Administered 2014-04-22: 100 mL/h via INTRAVENOUS
  Administered 2014-04-22: 20:00:00 via INTRAVENOUS

## 2014-04-22 MED ORDER — ROCURONIUM BROMIDE 100 MG/10ML IV SOLN
INTRAVENOUS | Status: DC | PRN
  Administered 2014-04-22: 30 mg via INTRAVENOUS
  Administered 2014-04-22: 40 mg via INTRAVENOUS
  Administered 2014-04-22: 100 mg via INTRAVENOUS
  Administered 2014-04-22: 30 mg via INTRAVENOUS
  Administered 2014-04-22: 40 mg via INTRAVENOUS
  Administered 2014-04-22 (×2): 30 mg via INTRAVENOUS
  Administered 2014-04-22: 40 mg via INTRAVENOUS
  Administered 2014-04-22 (×2): 30 mg via INTRAVENOUS

## 2014-04-22 MED ORDER — MORPHINE SULFATE (PF) 1 MG/ML IV SOLN
INTRAVENOUS | Status: DC
Start: 2014-04-22 — End: 2014-04-23
  Administered 2014-04-22: 1.5 mg via INTRAVENOUS
  Administered 2014-04-22: 19:00:00 via INTRAVENOUS
  Administered 2014-04-23: 6.68 mg via INTRAVENOUS
  Administered 2014-04-23: 16.5 mg via INTRAVENOUS
  Administered 2014-04-23: 4.5 mg via INTRAVENOUS
  Administered 2014-04-23: 02:00:00 via INTRAVENOUS
  Filled 2014-04-22 (×2): qty 25

## 2014-04-22 MED ORDER — SCOPOLAMINE 1 MG/3DAYS TD PT72
MEDICATED_PATCH | TRANSDERMAL | Status: AC
  Filled 2014-04-22: qty 1

## 2014-04-22 MED ORDER — FENTANYL CITRATE 0.05 MG/ML IJ SOLN: INTRAMUSCULAR | Status: DC | PRN

## 2014-04-22 MED ORDER — LACTATED RINGERS IV SOLN
INTRAVENOUS | Status: DC | PRN
  Administered 2014-04-22 (×2): via INTRAVENOUS

## 2014-04-22 MED ORDER — LIDOCAINE HCL (CARDIAC) 20 MG/ML IV SOLN
INTRAVENOUS | Status: DC | PRN
  Administered 2014-04-22: 75 mg via INTRAVENOUS

## 2014-04-22 MED ORDER — ENOXAPARIN SODIUM 40 MG/0.4ML ~~LOC~~ SOLN
40.0000 mg | SUBCUTANEOUS | Status: DC
  Administered 2014-04-23 – 2014-04-29 (×7): 40 mg via SUBCUTANEOUS
  Filled 2014-04-22 (×9): qty 0.4

## 2014-04-22 MED ORDER — DIPHENHYDRAMINE HCL 50 MG/ML IJ SOLN: 12.5000 mg | Freq: Four times a day (QID) | INTRAMUSCULAR | Status: DC | PRN

## 2014-04-22 MED ORDER — ALVIMOPAN 12 MG PO CAPS
12.0000 mg | ORAL_CAPSULE | Freq: Two times a day (BID) | ORAL | Status: DC
  Administered 2014-04-23 – 2014-04-25 (×6): 12 mg via ORAL
  Filled 2014-04-22 (×8): qty 1

## 2014-04-22 MED ORDER — DEXTROSE 5 % IV SOLN
2.0000 g | INTRAVENOUS | Status: AC
  Administered 2014-04-22 (×2): 2 g via INTRAVENOUS
  Filled 2014-04-22: qty 2

## 2014-04-22 MED ORDER — ATORVASTATIN CALCIUM 80 MG PO TABS
80.0000 mg | ORAL_TABLET | Freq: Every evening | ORAL | Status: DC
  Administered 2014-04-22 – 2014-04-29 (×8): 80 mg via ORAL
  Filled 2014-04-22 (×3): qty 1
  Filled 2014-04-22: qty 2
  Filled 2014-04-22 (×4): qty 1

## 2014-04-22 MED ORDER — INSULIN ASPART 100 UNIT/ML ~~LOC~~ SOLN
5.0000 [IU] | Freq: Once | SUBCUTANEOUS | Status: AC
  Administered 2014-04-22: 5 [IU] via SUBCUTANEOUS

## 2014-04-22 MED ORDER — GLYCOPYRROLATE 0.2 MG/ML IJ SOLN
INTRAMUSCULAR | Status: DC | PRN
Start: 2014-04-22 — End: 2014-04-22
  Administered 2014-04-22: .6 mg via INTRAVENOUS

## 2014-04-22 MED ORDER — NEOSTIGMINE METHYLSULFATE 10 MG/10ML IV SOLN
INTRAVENOUS | Status: DC | PRN
  Administered 2014-04-22: 4 mg via INTRAVENOUS

## 2014-04-22 MED ORDER — ACETAMINOPHEN 10 MG/ML IV SOLN
INTRAVENOUS | Status: DC | PRN
  Administered 2014-04-22: 1000 mg via INTRAVENOUS

## 2014-04-22 MED ORDER — PROPOFOL 10 MG/ML IV BOLUS
INTRAVENOUS | Status: DC | PRN
  Administered 2014-04-22: 200 mg via INTRAVENOUS

## 2014-04-22 MED ORDER — SODIUM CHLORIDE 0.9 % IJ SOLN
INTRAMUSCULAR | Status: AC
  Filled 2014-04-22: qty 20

## 2014-04-22 SURGICAL SUPPLY — 114 items
APPLIER CLIP 11 MED OPEN (CLIP) ×5
APPLIER CLIP 5 13 M/L LIGAMAX5 (MISCELLANEOUS)
APPLIER CLIP ROT 10 11.4 M/L (STAPLE)
APR CLP MED 11 20 MLT OPN (CLIP) ×3
APR CLP MED LRG 11.4X10 (STAPLE)
APR CLP MED LRG 5 ANG JAW (MISCELLANEOUS)
BLADE EXTENDED COATED 6.5IN (ELECTRODE) ×10 IMPLANT
BLADE HEX COATED 2.75 (ELECTRODE) IMPLANT
BLADE SURG 15 STRL LF DISP TIS (BLADE) ×3 IMPLANT
BLADE SURG 15 STRL SS (BLADE) ×4
BLADE SURG SZ10 CARB STEEL (BLADE) ×5 IMPLANT
BLADE SURG SZ11 CARB STEEL (BLADE) IMPLANT
CABLE HIGH FREQUENCY MONO STRZ (ELECTRODE) IMPLANT
CANISTER SUCTION 2500CC (MISCELLANEOUS) ×5 IMPLANT
CATH FOLEY SILVER 30CC 28FR (CATHETERS) IMPLANT
CELLS DAT CNTRL 66122 CELL SVR (MISCELLANEOUS) IMPLANT
CLIP APPLIE 11 MED OPEN (CLIP) ×3 IMPLANT
CLIP APPLIE 5 13 M/L LIGAMAX5 (MISCELLANEOUS) IMPLANT
CLIP APPLIE ROT 10 11.4 M/L (STAPLE) IMPLANT
COVER MAYO STAND STRL (DRAPES) ×5 IMPLANT
COVER SURGICAL LIGHT HANDLE (MISCELLANEOUS) IMPLANT
DECANTER SPIKE VIAL GLASS SM (MISCELLANEOUS) ×5 IMPLANT
DRAIN CHANNEL 15F RND FF 3/16 (WOUND CARE) ×5 IMPLANT
DRAIN CHANNEL 19F RND (DRAIN) ×5 IMPLANT
DRAPE CAMERA CLOSED 9X96 (DRAPES) ×5 IMPLANT
DRAPE LAPAROSCOPIC ABDOMINAL (DRAPES) ×5 IMPLANT
DRAPE SHEET LG 3/4 BI-LAMINATE (DRAPES) ×10 IMPLANT
DRAPE UTILITY XL STRL (DRAPES) ×5 IMPLANT
DRAPE WARM FLUID 44X44 (DRAPE) ×5 IMPLANT
DRSG TEGADERM 2-3/8X2-3/4 SM (GAUZE/BANDAGES/DRESSINGS) IMPLANT
DRSG TEGADERM 4X4.75 (GAUZE/BANDAGES/DRESSINGS) IMPLANT
ELECT REM PT RETURN 9FT ADLT (ELECTROSURGICAL) ×5
ELECTRODE REM PT RTRN 9FT ADLT (ELECTROSURGICAL) ×3 IMPLANT
EVACUATOR 1/8 PVC DRAIN (DRAIN) ×5 IMPLANT
EVACUATOR SILICONE 100CC (DRAIN) ×5 IMPLANT
FILTER SMOKE EVAC LAPAROSHD (FILTER) IMPLANT
GAUZE SPONGE 4X4 12PLY STRL (GAUZE/BANDAGES/DRESSINGS) ×5 IMPLANT
GLOVE BIO SURGEON STRL SZ 6.5 (GLOVE) ×16 IMPLANT
GLOVE BIO SURGEONS STRL SZ 6.5 (GLOVE) ×4
GLOVE BIOGEL M STRL SZ7.5 (GLOVE) ×10 IMPLANT
GLOVE BIOGEL PI IND STRL 7.0 (GLOVE) ×15 IMPLANT
GLOVE BIOGEL PI INDICATOR 7.0 (GLOVE) ×10
GLOVE ECLIPSE 7.0 STRL STRAW (GLOVE) ×20 IMPLANT
GLOVE SURG SS PI 6.5 STRL IVOR (GLOVE) ×25 IMPLANT
GLOVE SURG SS PI 7.5 STRL IVOR (GLOVE) ×20 IMPLANT
GOWN L4 XXLG W/PAP TWL (GOWN DISPOSABLE) ×30 IMPLANT
GOWN SPEC L4 XLG W/TWL (GOWN DISPOSABLE) ×20 IMPLANT
GOWN STRL REUS W/TWL LRG LVL3 (GOWN DISPOSABLE) ×50 IMPLANT
KIT BASIN OR (CUSTOM PROCEDURE TRAY) ×15 IMPLANT
LEGGING LITHOTOMY PAIR STRL (DRAPES) ×5 IMPLANT
NS IRRIG 1000ML POUR BTL (IV SOLUTION) ×5 IMPLANT
PACK GENERAL/GYN (CUSTOM PROCEDURE TRAY) ×10 IMPLANT
PACK UNIVERSAL I (CUSTOM PROCEDURE TRAY) ×5 IMPLANT
PAD ABD 8X10 STRL (GAUZE/BANDAGES/DRESSINGS) ×15 IMPLANT
PENCIL BUTTON HOLSTER BLD 10FT (ELECTRODE) ×10 IMPLANT
PIN HEMORRHAGE OCCLUDER (PIN)
PIN HMRHG STD 7X10XAPL SRR (PIN) IMPLANT
PORT LAP GEL ALEXIS MED 5-9CM (MISCELLANEOUS) ×5 IMPLANT
POUCH OSTOMY 2  H (OSTOMY) ×5 IMPLANT
RELOAD PROXIMATE 75MM BLUE (ENDOMECHANICALS) ×5 IMPLANT
RETRACTOR LONE STAR DISPOSABLE (INSTRUMENTS) ×5 IMPLANT
RETRACTOR STAY HOOK 5MM (MISCELLANEOUS) ×5 IMPLANT
RTRCTR WOUND ALEXIS 18CM MED (MISCELLANEOUS)
SCISSORS LAP 5X35 DISP (ENDOMECHANICALS) ×5 IMPLANT
SEALER TISSUE G2 CVD JAW 35 (ENDOMECHANICALS) IMPLANT
SEALER TISSUE G2 CVD JAW 45CM (ENDOMECHANICALS)
SEALER TISSUE G2 STRG ARTC 35C (ENDOMECHANICALS) ×5 IMPLANT
SET IRRIG TUBING LAPAROSCOPIC (IRRIGATION / IRRIGATOR) ×5 IMPLANT
SHEARS HARMONIC 9CM CVD (BLADE) ×10 IMPLANT
SOLUTION ANTI FOG 6CC (MISCELLANEOUS) IMPLANT
SPONGE DRAIN TRACH 4X4 STRL 2S (GAUZE/BANDAGES/DRESSINGS) ×10 IMPLANT
SPONGE LAP 18X18 X RAY DECT (DISPOSABLE) ×20 IMPLANT
STAPLER PROXIMATE 75MM BLUE (STAPLE) ×10 IMPLANT
STAPLER VISISTAT 35W (STAPLE) ×10 IMPLANT
SUCTION POOLE TIP (SUCTIONS) ×5 IMPLANT
SUT ETHILON 2 0 PS N (SUTURE) ×10 IMPLANT
SUT PDS AB 1 CT1 27 (SUTURE) IMPLANT
SUT PDS AB 1 CTX 36 (SUTURE) ×15 IMPLANT
SUT PDS AB 1 TP1 96 (SUTURE) ×10 IMPLANT
SUT PDS AB 2-0 CT2 27 (SUTURE) ×5 IMPLANT
SUT PROLENE 2 0 KS (SUTURE) IMPLANT
SUT SILK 2 0 (SUTURE) ×5
SUT SILK 2 0 SH (SUTURE) ×15 IMPLANT
SUT SILK 2 0 SH CR/8 (SUTURE) ×5 IMPLANT
SUT SILK 2-0 18XBRD TIE 12 (SUTURE) ×3 IMPLANT
SUT SILK 3 0 (SUTURE) ×4
SUT SILK 3 0 SH CR/8 (SUTURE) ×5 IMPLANT
SUT SILK 3-0 18XBRD TIE 12 (SUTURE) ×3 IMPLANT
SUT VIC AB 2-0 SH 18 (SUTURE) ×30 IMPLANT
SUT VIC AB 2-0 SH 27 (SUTURE) ×4
SUT VIC AB 2-0 SH 27X BRD (SUTURE) ×3 IMPLANT
SUT VIC AB 3-0 PS2 18 (SUTURE) ×16
SUT VIC AB 3-0 PS2 18XBRD (SUTURE) ×12 IMPLANT
SUT VIC AB 3-0 SH 18 (SUTURE) ×10 IMPLANT
SUT VIC AB 3-0 SH 27 (SUTURE) ×4
SUT VIC AB 3-0 SH 27XBRD (SUTURE) ×3 IMPLANT
SUT VIC AB 4-0 PS2 27 (SUTURE) ×20 IMPLANT
SUT VIC AB 4-0 SH 18 (SUTURE) ×5 IMPLANT
SUT VICRYL 2 0 18  UND BR (SUTURE) ×4
SUT VICRYL 2 0 18 UND BR (SUTURE) ×6 IMPLANT
SYR 30ML LL (SYRINGE) IMPLANT
SYS LAPSCP GELPORT 120MM (MISCELLANEOUS)
SYSTEM LAPSCP GELPORT 120MM (MISCELLANEOUS) IMPLANT
TAPE CLOTH 4X10 WHT NS (GAUZE/BANDAGES/DRESSINGS) ×5 IMPLANT
TOWEL OR 17X26 10 PK STRL BLUE (TOWEL DISPOSABLE) ×10 IMPLANT
TOWEL OR NON WOVEN STRL DISP B (DISPOSABLE) ×10 IMPLANT
TRAY FOLEY CATH 14FRSI W/METER (CATHETERS) ×5 IMPLANT
TROCAR ADV FIXATION 5X100MM (TROCAR) ×5 IMPLANT
TROCAR BLADELESS OPT 12M 100M (ENDOMECHANICALS) IMPLANT
TROCAR BLADELESS OPT 5 100 (ENDOMECHANICALS) ×10 IMPLANT
TUBING FILTER THERMOFLATOR (ELECTROSURGICAL) ×5 IMPLANT
TUBING IRRIGATION (MISCELLANEOUS) ×5 IMPLANT
YANKAUER SUCT BULB TIP 10FT TU (MISCELLANEOUS) ×10 IMPLANT
YANKAUER SUCT BULB TIP NO VENT (SUCTIONS) ×10 IMPLANT

## 2014-04-22 NOTE — Transfer of Care (Signed)
Immediate Anesthesia Transfer of Care Note  Patient: Julian Palmer  Procedure(s) Performed: Procedure(s) (LRB): LAPAROSCOPIC  ABDOMINAL PERINEAL RESECTION, CLOSURE OF URETEROTOMY AND BLADDER IRRIGATION (N/A) VRAM FLAP (N/A)  Patient Location: PACU  Anesthesia Type: General  Level of Consciousness: sedated, patient cooperative and responds to stimulation  Airway & Oxygen Therapy: Patient Spontanous Breathing and Patient connected to face mask oxgen  Post-op Assessment: Report given to PACU RN and Post -op Vital signs reviewed and stable  Post vital signs: Reviewed and stable  Complications: No apparent anesthesia complications

## 2014-04-22 NOTE — Op Note (Signed)
Operative Note   DATE OF OPERATION: 10.29.2015  LOCATION: [facility]- inpatient  SURGICAL DIVISION: Plastic Surgery  PREOPERATIVE DIAGNOSES:  Recurrent anal cancer, history therapeutic radiation  POSTOPERATIVE DIAGNOSES:  same  PROCEDURE:  Vertical rectus abdominus myocutaneous flap to perineum  SURGEON: Irene Limbo MD MBA  ASSISTANT: none  ANESTHESIA:  General.   EBL: 1100 ml for entire procedure  COMPLICATIONS: None.   INDICATIONS FOR PROCEDURE:  The patient, Julian Palmer, is a 53 y.o. male born on 10/01/1960, is here for abdominoperoneal resection for recurrent anal cancer. Patient has undergone prior chemotherapy and radiation.   FINDINGS: Primary closure of anterior sheath rectus from donor site completed.   DESCRIPTION OF PROCEDURE:  The patient's operative site was marked with the patient in the preoperative area. Please refer to Dr. Marcello Moores note for description of laparoscopic assisted resection. Following completion of this, a vertically oriented skin paddle was designed over right upper abdomen centered over rectus abdominus flap. Incision made sharply around skin paddle and carried to anterior rectus fascia. Anterior sheath incised beneath surrounding skin paddle. Rectus abdominus then freed from both anterior and posterior sheath at level of costal margin and muscle divided with GIA 75 stapler. Dissection then proceeded caudally; segmental nerves and blood vessels controlled with clips. Skin incision continued in midline from skin paddle toward pubic bone. Inferior epigastric pedicle identified and had palpable pulse. The flap was then rotated over pubic bone into pelvis without tension. The rectus abdominus muscle was then sewn into perineum using 2-0 PDS interrupted to approximate superficial fascia. The skin paddle was trimmed to defect and inset with interrupted 3-0 vicryl in dermis and interrupted 4-0 vicryl in simple and horizontal mattress sutures for skin  closure. Dry dressing was applied. Following closure of rectus fascia and peritoneum by Dr. Marcello Moores, a 11 Fr JP drain was placed percutaneously in subcutaneous plane. Drains were secured to skin with 2-0 nylon. Skin closure was completed with interrupted 3-0 vicryl in dermis and staples for skin closure.   The patient was allowed to wake from anesthesia, extubated and taken to the recovery room in satisfactory condition.   SPECIMENS: none  DRAINS: 15 Fr JP in subcutanous abdomen, 19 Fr JP in pelvis  Irene Limbo, MD [facility]astic [facility] [phone]

## 2014-04-22 NOTE — Interval H&P Note (Signed)
History and Physical Interval Note:  04/22/2014 8:51 AM  Julian Palmer  has presented today for surgery, with the diagnosis of recurrent anal cancer  The various methods of treatment have been discussed with the patient and family. After consideration of risks, benefits and other options for treatment, the patient has consented to  Procedure(s): LAPAROSCOPIC  ABDOMINAL PERINEAL RESECTION (N/A) VRAM FLAP (N/A) as a surgical intervention .  The patient's history has been reviewed, patient examined, no change in status, stable for surgery.  I have reviewed the patient's chart and labs.  Questions were answered to the patient's satisfaction.    The surgery and anatomy were described to the patient as well as the risks of surgery and the possible complications.  These include: Bleeding, deep abdominal infections and possible wound complications such as hernia and infection, damage to adjacent structures,  possible need for other procedures, such as abscess drains in radiology, possible prolonged hospital stay, possible diarrhea from removal of part of the colon, possible constipation from narcotics, prolonged fatigue/weakness or appetite loss, possible early recurrence of of disease, possible complications of their medical problems such as heart disease or arrhythmias or lung problems, death (less than 1%). He understands the need for permanent colostomy.  I believe the patient understands and wishes to proceed with the surgery.  Julian Adie, MD  Colorectal and [place] Surgery

## 2014-04-22 NOTE — H&P (View-Only) (Signed)
HISTORY:  Julian Palmer is a 53 y.o. male who presents to the office with a new/recurrent anal cancer. This was found on excisional biopsy. Workup thus far has included PET scan, which was neg for metastatic disease. 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 He developed anal cancer about 4 years ago and was treated with chemoRT with a complete response. He has lost about 10 lbs in last 2 months, which he attributes to depression. He denies any rectal bleeding or pain. Per pt, his HIV is well controlled and undetectable. 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  Past Medical History   Diagnosis  Date   .  CAD (coronary artery disease)    .  HTN (hypertension)    .  HLD (hyperlipidemia)    .  S/P angioplasty      stent to his proximal LAD in 1998 angioplasty to the mdi to distal LAD   .  Carcinoma in situ of anal canal  2010   .  Diabetes mellitus      Non insulin Dependent   .  Cancer  October 06, 2010     invasive squamous cell carcinoma, poorly differentiated, status post radiation/chemotherapy. 9 o'clock position.    Past Surgical History   Procedure  Laterality  Date   .  Stent (other)     .  Colonoscopy   2009   .  Anal polyp excision   2009   .  Hernia repair   1988   .  Septoplasty   1985   .  Excision anal mass   2012   .  Angioplasty   1998   .  Sphincterotomy   2009   .  Anal skin squamous cell reexcision   2010     radiation and chemotherapy   .  Insertion central venous access device w/ subcutaneous port     .  Rectal mass removal   01/08/14    Current Outpatient Prescriptions   Medication  Sig  Dispense  Refill   .  aspirin (ASPIR-81) 81 MG EC tablet  Take 81 mg by mouth daily. Take 2 tabs     .  atorvastatin (LIPITOR) 80 MG tablet  Take 1 tablet (80 mg total) by mouth daily.  90 tablet  3   .  citalopram (CELEXA) 20 MG tablet  Take 1 tablet (20 mg total) by mouth daily.  30 tablet  5   .  efavirenz-emtrictabine-tenofovir (ATRIPLA) [id number] MG  per tablet  Take 1 tablet by mouth daily.     Marland Kitchen  glimepiride (AMARYL) 2 MG tablet  Take 8 mg tablets daily.     Marland Kitchen  losartan (COZAAR) 100 MG tablet  Take 1 tablet (100 mg total) by mouth daily.  90 tablet  4   .  metFORMIN (GLUCOPHAGE) 1000 MG tablet  Take 1,000 mg by mouth 2 (two) times daily.     .  Multiple Vitamins-Minerals (MULTIVITAL) tablet  Take 1 tablet by mouth daily.     .  niacin (NIASPAN) 1000 MG CR tablet  Take 1 tablet (1,000 mg total) by mouth at bedtime. 30 minutes after aspirin  90 tablet  3   .  sildenafil (VIAGRA) 100 MG tablet  Take 1 tablet (100 mg total) by mouth daily as needed for erectile dysfunction.  3 tablet  10   .  testosterone cypionate (DEPOTESTOTERONE CYPIONATE) 200 MG/ML injection  Inject  240 mg into the muscle.     .  testosterone enanthate (DELATESTRYL) 200 MG/ML injection  Inject 50 mg into the muscle every 14 (fourteen) days. For IM use only     .  zolpidem (AMBIEN) 10 MG tablet  Take 1 tablet (10 mg total) by mouth at bedtime as needed for sleep.  30 tablet  1    No current facility-administered medications for this visit.    Allergies   Allergen  Reactions   .  Sulfa Antibiotics  Hives   .  Sulfonamide Derivatives     Family History   Problem  Relation  Age of Onset   .  Diabetes type II  Mother    .  Breast cancer  Mother      survivor   .  Hypertension  Mother    .  Coronary artery disease  Father     History    Social History   .  Marital Status:  Single     Spouse Name:  N/A     Number of Children:  N/A   .  Years of Education:  N/A    Occupational History   .   [place] History Main Topics   .  Smoking status:  Never Smoker   .  Smokeless tobacco:  None      Comment: tobacco use- no   .  Alcohol Use:  No      Comment: occas.   .  Drug Use:  No   .  Sexual Activity:  None    Other Topics  Concern   .  None    Social History Narrative    Full time. Single. Does not regularly exercise.   REVIEW OF  SYSTEMS - PERTINENT POSITIVES ONLY:  Review of Systems - General ROS: negative for - chills, fever or pos for weight loss  Respiratory ROS: no cough, shortness of breath, or wheezing  Cardiovascular ROS: no chest pain or dyspnea on exertion  Gastrointestinal ROS: no abdominal pain, change in bowel habits, or black or bloody stools  Genito-Urinary ROS: no dysuria, trouble voiding, or hematuria  Musculoskeletal ROS: negative for - joint pain or joint swelling  EXAM:  BP 153/84  Pulse 82  Temp(Src) 98.1 F (36.7 C) (Oral)  Resp 16  Ht 5\' 11"  (1.803 m)  Wt 209 lb (94.802 kg)  BMI 29.16 kg/m2  SpO2 98% Gen: No acute distress. Well nourished and well groomed.  Neurological: Alert and oriented to person, place, and time. Coordination normal.  Head: Normocephalic and atraumatic.  Eyes: Conjunctivae are normal. Pupils are equal, round, and reactive to light. No scleral icterus.  Neck: Normal range of motion. Neck supple. No tracheal deviation or thyromegaly present. No cervical lymphadenopathy.  Cardiovascular: Normal rate, regular rhythm, normal heart sounds and intact distal pulses.  Respiratory: Effort normal. No respiratory distress. No chest wall tenderness. Breath sounds normal. No wheezes, rales or rhonchi.  GI: Soft. Bowel sounds are normal. The abdomen is soft and nontender. There is no rebound and no guarding.  Musculoskeletal: Normal range of motion. Extremities are nontender.  Skin: Skin is warm and dry. No rash noted. No diaphoresis. No erythema. No pallor. No clubbing, cyanosis, or edema.  Psychiatric: Normal mood and affect. Behavior is normal. Judgment and thought content normal.  LABORATORY RESULTS:  Pathology report reviewed  RADIOLOGY RESULTS:  Images and reports are reviewed.  Available labs are reviewed PET negative for metabolically active  tumor in the neck, chest, abdomen or pelvis.  Procedure: Anoscopy  Surgeon: Marcello Moores  Assistant: Moffit  After the risks and  benefits were explained, verbal consent was obtained for above procedure  Anesthesia: none  Diagnosis: anal mass  Findings: surgical scar R anterior perianal region. Radiation scarring R lateral anal canal. No obvious tumor noted.  ASSESSMENT AND PLAN:  Julian Palmer Is a 53 year old male who presents to the office with a perianal carcinoma. This was excised in July. He has a history of anal cancer in the past and has received chemotherapy and radiation. His PET scan was negative for any focally hypermetabolic regions. He is here today for a second opinion. I have reviewed the pathology report in detail. It does appear that some margins were positive. I have asked to obtain the slides from [facility] and have one of our pathologist look at them here. The margins are truly positive. I think resection with an abdominal perineal approach would be appropriate as he cannot undergo chemotherapy and radiation.  We discussed that standard of care would be APR.  He is very opposed to this.  I offered to repeat his excisional biopsy.  He was in favor of this.  He understands that if the biopsy is positive, we would need to do an APR.  He understands that if the biopsy is negative, that does not mean that he is necessarily cancer free.  We discussed that this treatment is not standard of care for anal cancer.  We also discussed his HIV status. His greatest fear is that his family will find out about this. He is very concerned that this information will be available to them during his hospital stay. I reassured the patient that his HIV status should remain confidential and there is no reason that his family would need to know about this.  Rosario Adie, MD  Colon and Rectal Surgery / [facility]

## 2014-04-22 NOTE — Anesthesia Postprocedure Evaluation (Signed)
Anesthesia Post Note  Patient: Julian Palmer  Procedure(s) Performed: Procedure(s) (LRB): LAPAROSCOPIC  ABDOMINAL PERINEAL RESECTION, CLOSURE OF URETEROTOMY AND BLADDER IRRIGATION (N/A) VRAM FLAP (N/A)  Anesthesia type: General  Patient location: PACU  Post pain: Pain level controlled  Post assessment: Post-op Vital signs reviewed  Last Vitals: BP 135/75  Pulse 99  Temp(Src) 36.9 C (Oral)  Resp 11  Ht 5\' 11"  (1.803 m)  Wt 212 lb (96.163 kg)  BMI 29.58 kg/m2  SpO2 96%  Post vital signs: Reviewed  Level of consciousness: sedated  Complications: No apparent anesthesia complications \

## 2014-04-22 NOTE — Interval H&P Note (Signed)
History and Physical Interval Note:  04/22/2014 8:27 AM  Julian Palmer  has presented today for surgery, with the diagnosis of recurrent anal cancer  The various methods of treatment have been discussed with the patient and family. After consideration of risks, benefits and other options for treatment, the patient has consented to  Procedure(s): LAPAROSCOPIC  ABDOMINAL PERINEAL RESECTION (N/A) VRAM FLAP (N/A) as a surgical intervention .  The patient's history has been reviewed, patient examined, no change in status, stable for surgery.  I have reviewed the patient's chart and labs.  Questions were answered to the patient's satisfaction.     Christo Hain

## 2014-04-22 NOTE — Progress Notes (Signed)
Notified Dr Lissa Hoard of [facility]. No new orders. Spoke w [facility] in holding. Unable to get T&S here. Patient too wiggly and jerking away. Will draw blood at time of IV start.

## 2014-04-22 NOTE — Op Note (Signed)
Preoperative diagnosis: Rectal cancer, urethrotomy 2 Postoperative diagnosis: Rectal cancer, urethrotomy 2  Procedure: Closure of urethrotomy 2  Surgeon: Julian Palmer Assistant: Julian Palmer  Anesthesia: Gen.  Indication for procedure: Julian Palmer is a 53 year old male undergoing APR due to his prior radiation the rectum was stuck to the membranous urethra and prostate in this plane was extremely difficult to develop. 2 urethrotomy's were developed and further dissection was difficult therefore urology was consulted for assistance and rectal dissection off the prostate as well as urethrotomy closure.  Description of procedure: I entered the room and palpation posterior to the bladder revealed the peritoneum still intact. With traction on the catheter I could palpate the balloon in the bladder. The urine was clear. Going down below there was a urethrotomy near the membranous urethra and the urethrotomy in the apical region of the prostate. With a finger in the rectum I assisted Dr.,'s in dissecting the rectum off the prostate. We initially went inferior to the more proximal urethrotomy but it appeared we were subcapsular on the prostate. Therefore the prostate capsule was pulled anteriorly movement more posteriorly and divided the levator muscles laterally. Then we were able to enter a plane posterior to the prostate and anterior to the rectum which was stuck but the more typical plane. This plane was developed further superiorly behind the bladder. Then the colon portion was passed down through the wound and the remaining peritoneal attachments were removed.  I then changed gloves and went back up top and filled the bladder 300 mL of saline. There was no extravasation of fluid and no extravasation of fluid through the urethrotomies. The bladder irrigated normally. With the hand below I could palpate the the balloon in the bladder and this seemed intact. The wound was copiously irrigated. I didn't term  attention to closing the urethrotomy's.  The more distal urethrotomy was closed first by reapproximating the mucosa with interrupted 4-0 Vicryl. These were then tied down sequentially. I then closed a second layer of more muscular tissue over this layer.  The more proximal urethrotomy was now approached. The mucosa again was reapproximated with several interrupted 4-0 Vicryl suture. Next the prostate capsule was closed over this layer with interrupted 4-0 Vicryl suture. This closed the urethrotomies and reapproximated to the best of my ability likely the urethral sphincter as well as the prostate capsule. The patient was in term back over to the care of Dr. [name].

## 2014-04-22 NOTE — Progress Notes (Signed)
Patient ID: Julian Palmer, male   DOB: 08/17/1960, 53 y.o.   MRN: [id number]  Patient examined in PACU. Urine clear. He's made about 300 ml urine and more coming in tube. Excellent UOP post-op. Will follow.

## 2014-04-22 NOTE — Op Note (Signed)
04/22/2014  3:36 PM  PATIENT:  Julian Palmer  53 y.o. male  Patient Care Team: [name], MD as PCP - General (Infectious Diseases) Robert Bellow, MD (General Surgery)  PRE-OPERATIVE DIAGNOSIS:  recurrent anal cancer  POST-OPERATIVE DIAGNOSIS:  recurrent anal cancer  PROCEDURE:  Procedure(s): LAPAROSCOPIC  ABDOMINAL PERINEAL RESECTION, CLOSURE OF URETEROTOMY AND BLADDER IRRIGATION VRAM FLAP   Surgeon(s): Leighton Ruff, MD Autumn Messing, MD (assist for rectal dissection) Festus Aloe, MD (assist for urethral closure) Irene Limbo, MD  ASSISTANT: see above   ANESTHESIA:   general  EBL:  Total I/O In: 5000 [I.V.:5000] Out: 6203 [Urine:205; Blood:1100]  DRAINS: (73F) Jackson-Pratt drain(s) with closed bulb suction in the pelvis   SPECIMEN:  Source of Specimen:  sigmoid, rectum and anus  DISPOSITION OF SPECIMEN:  PATHOLOGY  COUNTS:  YES  PLAN OF CARE: Admit to inpatient   PATIENT DISPOSITION:  PACU - hemodynamically stable.  INDICATION: This is a 53 year old male who presents with recurrent anal cancer after chemotherapy and radiation. After appropriate metastatic workup, the risk and benefits of abdominal perineal resection were explained to the patient prior to the OR.   OR FINDINGS: Tumor noted adherent to the right lateral pelvic sidewall and anteriorly as well.   DESCRIPTION: the patient was identified in the preoperative holding area and taken to the OR where they were laid supine  on the operating room table.  General  anesthesia was induced without difficulty. SCDs were also noted to be in place prior to the initiation of anesthesia.    Foley catheter was inserted under sterile conditions.   The patient was then placed in lithotomy position and appropriately padded..  The patient was then prepped and draped in the usual sterile fashion.    A surgical timeout was performed indicating the correct patient, procedure, positioning and need for  preoperative antibiotics.   I began by making a lower midline incision using a 10 blade scalpel and Bovie electrocautery.  This was carried down through subcutaneous tissues using electrocautery. The fascia was divided at midline. The peritoneum was entered bluntly. An [place] wound protector was placed. The sigmoid colon was mobilized off of the lateral pelvic sidewall using Bovie electrocautery. The ureter was identified and preserved. We transected the sigmoid colon using a blue load 75 mm GIA stapler. The remaining mesentery was divided using cut and clamp technique. I then developed the presacral plane bluntly. After this was completed I placed the [place] wound protector and placed the port through this. I then insufflated the abdomen and placed 3 5 mm ports for continued laparoscopic dissection. I dissected down the mesenteric planes using blunt dissection and the articulating Enseal device. The hypogastric nerves were identified and preserved. The lateral pelvic stalks were divided using the Enseal device. I developed the posterior plane to just above the coccyx. We divided around this using blunt dissection and the Enseal device. There was a large amount of scar tissue in the pelvis at this location due to the patient's previous radiation therapy. Once I got down as low as I could intra-abdominally, I turned my attention to the patient's anal canal. I used electrocautery to make an incision around his anal canal.  This was carried down through the subcutaneous tissues using the harmonic scalpel.  The peritoneum was entered posteriorly.  I then mobilized the scar tissue around the cancer using electrocautery.  The anterior plane was very fused to the prostate.  There was a break in the prostate  and a opening in the urethra could be noted.  I called Dr Junious Silk who assisted me with dissection of the prostate from the rectum.  He then closed a superior and inferior urethrotomy using interrupted 4-0  Vicryl sutures.  The Prostate capsule was closed over this.  The specimen was removed.  I then examined the pelvic wall where the cancer was noted.  There was a small mass there which was biopsied and sent for frozen section.  This returned as fibrotic tissue only. A stitch was placed through the right pelvic sidewall to close a bleeding vessel.  The area was then irrigated.  Hemostasis was good.   I then returned to the abdomen.  I made an incision in the skin using electrocautery at the previously marked ostomy site in the left lower quadrant. Dissection was carried down through simultaneous tissues using light cautery. The fascia was incised using a cruciate incision to allow one finger breath. The rectus was split and the peritoneum was entered bluntly. The distal descending colon was brought out through the ostomy site and secured into place.  I then turned the case over to Dr Leland Johns for harvesting of the VRAM flap that will be dictated separately.  After the muscle flap was rotated down into the pelvis the peritoneum was closed using a 2-0 Vicryl running suture. A 19 [place] Blake drain was placed into the pelvis and brought out through the right lower quadrant incision site. The fascia was then closed using 2, 0 looped PDS sutures.  A 15 French drain was placed in the subcutaneous space .  The subcutaneous tissue was reapproximated using interrupted 2-0 Vicryl sutures. The skin was closed using staples. The JP sites were secured using 2-0 nylon sutures. The ostomy was then matured using interrupted 2-0 Vicryl sutures in a standard [place] fashion. An ostomy appliance was placed. A sterile dressing was placed. The patient was awakened from anesthesia and sent to the post anesthesia care unit in stable condition. All counts were correct per operating room staff.

## 2014-04-23 LAB — POCT I-STAT 7, (LYTES, BLD GAS, ICA,H+H)
Acid-base deficit: 4 mmol/L — ABNORMAL HIGH (ref 0.0–2.0)
BICARBONATE: 20.7 meq/L (ref 20.0–24.0)
Calcium, Ion: 1.07 mmol/L — ABNORMAL LOW (ref 1.12–1.23)
HCT: 25 % — ABNORMAL LOW (ref 39.0–52.0)
HEMOGLOBIN: 8.5 g/dL — AB (ref 13.0–17.0)
O2 Saturation: 99 %
PCO2 ART: 35.3 mmHg (ref 35.0–45.0)
PH ART: 7.373 (ref 7.350–7.450)
POTASSIUM: 3.8 meq/L (ref 3.7–5.3)
Sodium: 138 mEq/L (ref 137–147)
TCO2: 22 mmol/L (ref 0–100)
pO2, Arterial: 159 mmHg — ABNORMAL HIGH (ref 80.0–100.0)

## 2014-04-23 LAB — GLUCOSE, CAPILLARY
GLUCOSE-CAPILLARY: 198 mg/dL — AB (ref 70–99)
GLUCOSE-CAPILLARY: 275 mg/dL — AB (ref 70–99)
GLUCOSE-CAPILLARY: 301 mg/dL — AB (ref 70–99)
GLUCOSE-CAPILLARY: 247 mg/dL — AB (ref 70–99)
GLUCOSE-CAPILLARY: 208 mg/dL — AB (ref 70–99)
Glucose-Capillary: 233 mg/dL — ABNORMAL HIGH (ref 70–99)
Glucose-Capillary: 185 mg/dL — ABNORMAL HIGH (ref 70–99)
Glucose-Capillary: 175 mg/dL — ABNORMAL HIGH (ref 70–99)
Glucose-Capillary: 187 mg/dL — ABNORMAL HIGH (ref 70–99)
Glucose-Capillary: 248 mg/dL — ABNORMAL HIGH (ref 70–99)

## 2014-04-23 LAB — BASIC METABOLIC PANEL
ANION GAP: 14 (ref 5–15)
BUN: 13 mg/dL (ref 6–23)
CALCIUM: 8 mg/dL — AB (ref 8.4–10.5)
CHLORIDE: 100 meq/L (ref 96–112)
CO2: 22 mEq/L (ref 19–32)
Creatinine, Ser: 0.89 mg/dL (ref 0.50–1.35)
GFR calc Af Amer: >90 mL/min (ref >90)
GFR calc non Af Amer: >90 mL/min (ref >90)
GLUCOSE: 299 mg/dL — AB (ref 70–99)
POTASSIUM: 4.5 meq/L (ref 3.7–5.3)
SODIUM: 136 meq/L — AB (ref 137–147)

## 2014-04-23 LAB — CBC
HEMATOCRIT: 28.6 % — AB (ref 39.0–52.0)
HEMOGLOBIN: 10.1 g/dL — AB (ref 13.0–17.0)
MCH: 31.2 pg (ref 26.0–34.0)
MCHC: 35.3 g/dL (ref 30.0–36.0)
MCV: 88.3 fL (ref 78.0–100.0)
Platelets: 223 10*3/uL (ref 150–400)
RBC: 3.24 MIL/uL — AB (ref 4.22–5.81)
RDW: 13.9 % (ref 11.5–15.5)
WBC: 9.1 10*3/uL (ref 4.0–10.5)

## 2014-04-23 MED ORDER — INSULIN GLARGINE 100 UNIT/ML ~~LOC~~ SOLN
10.0000 [IU] | Freq: Every day | SUBCUTANEOUS | Status: DC
  Administered 2014-04-23 – 2014-04-24 (×2): 10 [IU] via SUBCUTANEOUS
  Filled 2014-04-23 (×2): qty 0.1

## 2014-04-23 MED ORDER — DIPHENHYDRAMINE HCL 12.5 MG/5ML PO ELIX
12.5000 mg | ORAL_SOLUTION | Freq: Four times a day (QID) | ORAL | Status: DC | PRN
Start: 2014-04-23 — End: 2014-04-26

## 2014-04-23 MED ORDER — INSULIN ASPART 100 UNIT/ML ~~LOC~~ SOLN
0.0000 [IU] | Freq: Every day | SUBCUTANEOUS | Status: DC
  Administered 2014-04-24 – 2014-04-27 (×2): 2 [IU] via SUBCUTANEOUS

## 2014-04-23 MED ORDER — INSULIN ASPART 100 UNIT/ML ~~LOC~~ SOLN
0.0000 [IU] | SUBCUTANEOUS | Status: DC
  Administered 2014-04-23 (×2): 7 [IU] via SUBCUTANEOUS
  Administered 2014-04-24: 11 [IU] via SUBCUTANEOUS
  Administered 2014-04-24: 7 [IU] via SUBCUTANEOUS
  Administered 2014-04-24: 4 [IU] via SUBCUTANEOUS
  Administered 2014-04-24 (×2): 7 [IU] via SUBCUTANEOUS

## 2014-04-23 MED ORDER — SODIUM CHLORIDE 0.9 % IV SOLN: INTRAVENOUS | Status: DC

## 2014-04-23 MED ORDER — NALOXONE HCL 0.4 MG/ML IJ SOLN: 0.4000 mg | INTRAMUSCULAR | Status: DC | PRN

## 2014-04-23 MED ORDER — MORPHINE SULFATE 4 MG/ML IJ SOLN: 4.0000 mg | INTRAMUSCULAR | Status: DC | PRN

## 2014-04-23 MED ORDER — MORPHINE SULFATE (PF) 1 MG/ML IV SOLN
INTRAVENOUS | Status: DC
Start: 2014-04-23 — End: 2014-04-26
  Administered 2014-04-24: 3 mg via INTRAVENOUS
  Administered 2014-04-24: 4.5 mg via INTRAVENOUS
  Administered 2014-04-24: 3 mg via INTRAVENOUS
  Administered 2014-04-24: 16.5 mg via INTRAVENOUS
  Administered 2014-04-24: 11:00:00 via INTRAVENOUS
  Administered 2014-04-25: 4.59 mg via INTRAVENOUS
  Administered 2014-04-25 (×2): 4.5 mg via INTRAVENOUS
  Administered 2014-04-25: 18:00:00 via INTRAVENOUS
  Administered 2014-04-25: 3 mg via INTRAVENOUS
  Administered 2014-04-25: 7.11 mg via INTRAVENOUS
  Administered 2014-04-25: 4.5 mg via INTRAVENOUS
  Administered 2014-04-26: 7.5 mg via INTRAVENOUS
  Filled 2014-04-23 (×2): qty 25

## 2014-04-23 MED ORDER — ONDANSETRON HCL 4 MG/2ML IJ SOLN: 4.0000 mg | Freq: Four times a day (QID) | INTRAMUSCULAR | Status: DC | PRN

## 2014-04-23 MED ORDER — MORPHINE SULFATE 2 MG/ML IJ SOLN: 2.0000 mg | INTRAMUSCULAR | Status: DC | PRN

## 2014-04-23 MED ORDER — SODIUM CHLORIDE 0.9 % IJ SOLN: 9.0000 mL | INTRAMUSCULAR | Status: DC | PRN

## 2014-04-23 MED ORDER — KCL IN DEXTROSE-NACL 20-5-0.45 MEQ/L-%-% IV SOLN
INTRAVENOUS | Status: DC
  Administered 2014-04-23 – 2014-04-24 (×2): via INTRAVENOUS
  Administered 2014-04-24: 100 mL/h via INTRAVENOUS
  Administered 2014-04-25 (×2): via INTRAVENOUS
  Filled 2014-04-23 (×7): qty 1000

## 2014-04-23 MED ORDER — DIPHENHYDRAMINE HCL 50 MG/ML IJ SOLN: 12.5000 mg | Freq: Four times a day (QID) | INTRAMUSCULAR | Status: DC | PRN

## 2014-04-23 MED ORDER — INSULIN ASPART 100 UNIT/ML ~~LOC~~ SOLN
0.0000 [IU] | Freq: Three times a day (TID) | SUBCUTANEOUS | Status: DC
  Administered 2014-04-23: 15 [IU] via SUBCUTANEOUS
  Administered 2014-04-23: 7 [IU] via SUBCUTANEOUS

## 2014-04-23 NOTE — Progress Notes (Signed)
POD#1 APR, VRAM  Temp:  [97.7 F (36.5 C)-98.9 F (37.2 C)] 97.7 F (36.5 C) (10/29 2323) Pulse Rate:  [92-105] 96 (10/30 0700) Resp:  [10-21] 16 (10/30 0800) BP: (127-140)/(71-77) 135/75 mmHg (10/29 1857) SpO2:  [93 %-100 %] 98 % (10/30 0800) Arterial Line BP: (96-135)/(61-74) 130/66 mmHg (10/30 0700) FiO2 (%):  [44 %] 44 % (10/30 0038)  JP 223/35  Pain with movement, ngt out, A line out now  PE: Drains sanguinous Flap soft viable with expected serosanguinous drainage  A/P: Plan incisional VAC placement Change dressing to perineum q shift and PRN saturation. Once more mobile, can leave open to air for times to reduce maceration  Irene Limbo, MD [facility] [phone]

## 2014-04-23 NOTE — Progress Notes (Signed)
Inpatient Diabetes Program Recommendations  AACE/ADA: New Consensus Statement on Inpatient Glycemic Control (2013)  Target Ranges:  Prepandial:   less than 140 mg/dL      Peak postprandial:   less than 180 mg/dL (1-2 hours)      Critically ill patients:  140 - 180 mg/dL   Reason for Visit: Hyperglycemia  Diabetes history: DM2 Outpatient Diabetes medications: metformin 1000 mg bid, amaryl 4 mg bid Current orders for Inpatient glycemic control: Novolog resistant tidwc and hs, Amaryl 4 mg bid  Continues NPO status. Needs correction insulin Q4H.  Inpatient Diabetes Program Recommendations Insulin - Basal: Consider addition of Lantus 10 units QHS Correction (SSI): Change Novolog to resistant Q4H while NPO Oral Agents: D/C Amaryl while inpatient. HgbA1C: 8.7% - uncontrolled Diet: NPO  Note: Will continue to follow. Thank you. Lorenda Peck, RD, LDN, CDE Inpatient Diabetes Coordinator [phone]

## 2014-04-23 NOTE — Progress Notes (Signed)
Patient ID: Julian Palmer, male   DOB: December 02, 1960, 53 y.o.   MRN: [id number]   POD#1 APR, VRAM, REPAIR OF URETHROTOMY X 2   Pt c/o abd pain.   Filed Vitals:   04/23/14 0933  BP: 129/76  Pulse: 94  Temp: 99 F (37.2 C)  Resp: 20    Intake/Output Summary (Last 24 hours) at 04/23/14 2505 Last data filed at 04/23/14 0900  Gross per 24 hour  Intake 7822.5 ml  Output   4088 ml  Net 3734.5 ml    PE: No acute distress Abdomen is soft and tender but nondistended Catheter in place-urine clear  Assessment - POD#1 APR, VRAM, REPAIR OF URETHROTOMY X 2  - Doing well.  Urine output excellent with clear urine, drain output as expected with not an excessive amount. Plan - continue Foley.  Will continue Foley likely for 3-4 weeks. Discussed with patient and sister long-term risk of urethral stricture, fistula, incontinence among others.

## 2014-04-23 NOTE — Evaluation (Signed)
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  PMHx:  HIV+ (family is unaware of this dx), MI with stent   Clinical Impression   Pt was admitted for the above.  He was limited by orthostatic hypotension during evaluation.  Pt will benefit from skilled OT to increase safety and activity tolerance.  Sister will assist with adls as needed.      Follow Up Recommendations  No OT follow up;Supervision/Assistance - 24 hour    Equipment Recommendations  None recommended by OT (likely)    Recommendations for Other Services       Precautions / Restrictions Precautions Precaution Comments: 2 JP drains, colostomy, multiple other lines at time of eval Restrictions Weight Bearing Restrictions: No      Mobility Bed Mobility   Bed Mobility: Rolling;Sidelying to Sit;Sit to Supine Rolling: Min assist Sidelying to sit: Min assist;HOB elevated (with rail)   Sit to supine: +2 for physical assistance;Mod assist   General bed mobility comments: cues given for technique  Transfers   Equipment used: Rolling walker (2 wheeled) Transfers: Sit to/from [facility] Sit to Stand: Min assist Stand pivot transfers: Min assist       General transfer comment: did not use RW as pt had stood up before OT was ready.  Assisted with lines and steadying assistance to recliner    Balance           Standing balance support: No upper extremity supported Standing balance-Leahy Scale: Poor                              ADL Overall ADL's : Needs assistance/impaired                         Toilet Transfer: Minimal assistance;Stand-pivot (to  recliner)             General ADL Comments: Pt is able to complete UB adls with assistance for lines only; he is limited with LB adls by pain (overall max A is needed)  Sister will assist as needed--pt is not interested in AE.  Pt sat EOB with PT earlier and was diaphoretic.  sat EOB with OT and stood almost immediately to get to chair--did not get beginning BP.  Pt dizzy when he got to chair:  BP 82/44--reclined, provided cool cloth and made room cooler.  After several minutes, BP 135/70.  Had NT stand by for return to bed and BP 121/71.  RN aware.       Vision                     Perception     Praxis      Pertinent Vitals/Pain Pain Score: 7  Pain Location: abdomen Pain Descriptors / Indicators: Aching;Sore Pain Intervention(s): PCA encouraged;Repositioned;Ice applied     Hand Dominance     Extremity/Trunk Assessment Upper Extremity Assessment Upper Extremity Assessment: Overall WFL for tasks assessed           Communication Communication Communication: No difficulties   Cognition Arousal/Alertness: Awake/alert Behavior During Therapy: WFL for tasks assessed/performed Overall Cognitive Status:  Within Functional Limits for tasks assessed  Pt impulsive during OT, but he is used to being independent.  Will need cues to wait.                     General Comments       Exercises       Shoulder Instructions      Home Living Family/patient expects to be discharged to:: Private residence Living Arrangements: Other relatives (sister) Available Help at Discharge: Family               Bathroom Shower/Tub: Tub/shower unit Shower/tub characteristics: Curtain Biochemist, clinical: Standard     Home Equipment: Environmental consultant - 4 wheels;Shower seat   Additional Comments: pt has colostomy.        Prior Functioning/Environment Level of Independence: Independent             OT Diagnosis: Generalized weakness;Acute pain   OT Problem List: Decreased  strength;Decreased activity tolerance;Impaired balance (sitting and/or standing);Pain;Decreased knowledge of use of DME or AE   OT Treatment/Interventions: Self-care/ADL training;DME and/or AE instruction;Patient/family education;Balance training    OT Goals(Current goals can be found in the care plan section) Acute Rehab OT Goals Patient Stated Goal: home. less pain OT Goal Formulation: With patient Time For Goal Achievement: 05/07/14 Potential to Achieve Goals: Good ADL Goals Pt Will Perform Grooming: with supervision;standing Pt Will Perform Tub/Shower Transfer: with min guard assist;Tub transfer;shower seat;ambulating  OT Frequency: Min 2X/week   Barriers to D/C:            Co-evaluation              End of Session    Activity Tolerance: Patient limited by pain (limited by BP) Patient left: in bed;with call bell/phone within reach;with family/visitor present   Time: 8416-6063 OT Time Calculation (min): 32 min Charges:  OT General Charges $OT Visit: 1 Procedure OT Evaluation $Initial OT Evaluation Tier I: 1 Procedure OT Treatments $Therapeutic Activity: 23-37 mins G-Codes:    Zasha Belleau 2014-05-02, 3:37 PM Lesle Chris, OTR/L [phone] 05-02-14

## 2014-04-23 NOTE — Evaluation (Signed)
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  PMHx:  HIV+ (family is unaware of this dx), MI with stent  Clinical Impression  Pt admitted with rectal CA. Pt currently with functional limitations due to the deficits listed below (see PT Problem List).  Pt will benefit from skilled PT to increase their independence and safety with mobility to allow discharge to the venue listed below.  Pt is very anxious regarding mobility, pain today; He was dizzy and diaphoretic with standing, unable to get BP d/t dinamap not working and unable to locate another one; Therapist, sports notified--RN will try to get pt up again later today;  Have discussed progression of raising HOB more, doing heel slides and ankle pumps with pt and his sister, prior to OOB later and they verbalize/demo understanding of this.      Follow Up Recommendations No PT follow up    Equipment Recommendations  None recommended by PT    Recommendations for Other Services       Precautions / Restrictions Precautions Precaution Comments: 2 JP drains, colostomy, multiple other lines at time of eval Restrictions Weight Bearing Restrictions: No      Mobility  Bed Mobility Overal bed mobility: Needs Assistance Bed Mobility: Rolling;Sidelying to Sit;Sit to Supine Rolling: Min assist Sidelying to sit: Mod assist;+2 for safety/equipment   Sit to supine: +2 for physical assistance;Min assist;Mod assist;+2 for safety/equipment   General bed mobility comments: multi-modal cues for log roll adn sequence; +2 to return to supine d/t dizziness/diaphoresis; pt reported decr symptoms after return to supine, RN mead aware (unable to get  BP withcurrent dinamap, unable to locate a second one)  Transfers Overall transfer level: Needs assistance Equipment used: Rolling walker (2 wheeled) Transfers: Sit to/from Stand Sit to Stand: Min assist;+2 safety/equipment         General transfer comment: cues for hand placement; pt attepmpted to begin taking steps, we had him wait a few seconds and he then became dizzy/diaphoretic, returned pt sit-->supine; RN notified; VSS (except BP --?--cuff not working)  Ambulation/Gait                Financial trader Rankin (Stroke Patients Only)       Balance Overall balance assessment: Needs assistance Sitting-balance support: Single extremity supported Sitting balance-Leahy Scale: Fair Sitting balance - Comments: not tested further d/t abd pain   Standing balance support: Bilateral upper extremity supported Standing balance-Leahy Scale: Poor Standing balance comment: should improve quickly with standing balance                              Pertinent Vitals/Pain Pain Assessment: 0-10 Pain Location: abdomen Pain Descriptors / Indicators: Sore Pain Intervention(s): PCA encouraged    Home Living Family/patient expects to be discharged to:: Private residence Living Arrangements: Other relatives (sister) Available Help at Discharge: Family Type of Home: House Home Access: Stairs to enter Entrance Stairs-Rails: Right Entrance Stairs-Number of Steps: 2 Home Layout: One level Home Equipment: Walker - 4 wheels;Shower seat      Prior Function Level of  Independence: Independent               Hand Dominance        Extremity/Trunk Assessment   Upper Extremity Assessment: Overall WFL for tasks assessed           Lower Extremity Assessment: Overall WFL for tasks assessed      Cervical / Trunk Assessment: Normal  Communication   Communication: No difficulties  Cognition Arousal/Alertness:  Awake/alert Behavior During Therapy: Anxious Overall Cognitive Status: Within Functional Limits for tasks assessed                      General Comments      Exercises        Assessment/Plan    PT Assessment Patient needs continued PT services  PT Diagnosis Difficulty walking;Acute pain   PT Problem List Decreased activity tolerance;Decreased mobility;Decreased knowledge of use of DME  PT Treatment Interventions DME instruction;Gait training;Functional mobility training;Therapeutic activities;Therapeutic exercise;Patient/family education   PT Goals (Current goals can be found in the Care Plan section) Acute Rehab PT Goals Patient Stated Goal: home. less pain PT Goal Formulation: With patient Time For Goal Achievement: 04/30/14 Potential to Achieve Goals: Good    Frequency Min 3X/week   Barriers to discharge        Co-evaluation               End of Session   Activity Tolerance: Treatment limited secondary to medical complications (Comment) (dizziness) Patient left: with call bell/phone within reach;in bed;with family/visitor present Nurse Communication: Mobility status         Time: 1030-1055 PT Time Calculation (min): 25 min   Charges:   PT Evaluation $Initial PT Evaluation Tier I: 1 Procedure PT Treatments $Therapeutic Activity: 23-37 mins   PT G Codes:          Julian Palmer 05-03-2014, 11:14 AM

## 2014-04-23 NOTE — Progress Notes (Signed)
1 Day Post-Op APR and VRAM flap Subjective: Doing ok.  Having quite a bit of pain.  Blood glucose elevated.    Objective: Vital signs in last 24 hours: Temp:  [97.7 F (36.5 C)-98.9 F (37.2 C)] 97.7 F (36.5 C) (10/29 2323) Pulse Rate:  [92-105] 96 (10/30 0700) Resp:  [10-21] 20 (10/30 0700) BP: (127-140)/(71-77) 135/75 mmHg (10/29 1857) SpO2:  [93 %-100 %] 97 % (10/30 0700) Arterial Line BP: (96-135)/(61-74) 130/66 mmHg (10/30 0700) FiO2 (%):  [44 %] 44 % (10/30 0038)   Intake/Output from previous day: 10/29 0701 - 10/30 0700 In: 8742.5 [I.V.:8602.5; NG/GT:90; IV Piggyback:50] Out: 5974 [Urine:1805; Emesis/NG output:700; Drains:258; Blood:1100] Intake/Output this shift:   General appearance: alert and cooperative GI: normal findings: liver span normal to percussion and appropriately tender JP: SS Ostomy pink and viable Incision: no significant drainage on dressing, no significant erythema  Lab Results:   Recent Labs  04/22/14 1521 04/23/14 0545  WBC  --  9.1  HGB 8.5* 10.1*  HCT 25.0* 28.6*  PLT  --  223   BMET  Recent Labs  04/22/14 1521 04/23/14 0545  NA 138 136*  K 3.8 4.5  CL  --  100  CO2  --  22  GLUCOSE  --  299*  BUN  --  13  CREATININE  --  0.89  CALCIUM  --  8.0*   PT/INR No results found for this basename: LABPROT, INR,  in the last 72 hours ABG  Recent Labs  04/22/14 1521  PHART 7.373  HCO3 20.7    MEDS, Scheduled . acetaminophen  1,000 mg Oral 4 times per day  . alvimopan  12 mg Oral BID  . atorvastatin  80 mg Oral QPM  . citalopram  40 mg Oral q morning - 10a  . efavirenz-emtricitabine-tenofovir  1 tablet Oral QHS  . enoxaparin (LOVENOX) injection  40 mg Subcutaneous Q24H  . glimepiride  4 mg Oral BID AC  . insulin aspart  0-20 Units Subcutaneous TID WC  . insulin aspart  0-5 Units Subcutaneous QHS  . losartan  100 mg Oral QPM  . morphine   Intravenous 6 times per day  . traZODone  200 mg Oral QHS     Studies/Results: No results found.  Assessment: s/p Procedure(s): LAPAROSCOPIC  ABDOMINAL PERINEAL RESECTION, CLOSURE OF URETEROTOMY AND BLADDER IRRIGATION VRAM FLAP Patient Active Problem List   Diagnosis Date Noted  . 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 Cancer of anal canal 01/21/2014  . Erectile dysfunction 04/17/2013  . Diabetes type 2, uncontrolled 04/17/2013  . 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 Hyperlipidemia 09/13/2009  . HYPERTENSION, BENIGN 09/13/2009  . CAD, NATIVE VESSEL 09/13/2009    Plan: d/c NG, cont NPO OOB today Increase SSI today IV Morphine for added pain control  Transfer to floor Incisional wound vac   LOS: 1 day     .Rosario Adie, MD [facility], [place]   04/23/2014 8:01 AM

## 2014-04-24 LAB — BASIC METABOLIC PANEL
Anion gap: 9 (ref 5–15)
BUN: 14 mg/dL (ref 6–23)
CALCIUM: 8 mg/dL — AB (ref 8.4–10.5)
CO2: 26 mEq/L (ref 19–32)
Chloride: 98 mEq/L (ref 96–112)
Creatinine, Ser: 0.86 mg/dL (ref 0.50–1.35)
GLUCOSE: 239 mg/dL — AB (ref 70–99)
Potassium: 3.8 mEq/L (ref 3.7–5.3)
Sodium: 133 mEq/L — ABNORMAL LOW (ref 137–147)

## 2014-04-24 LAB — CBC
HCT: 22.9 % — ABNORMAL LOW (ref 39.0–52.0)
Hemoglobin: 8 g/dL — ABNORMAL LOW (ref 13.0–17.0)
MCH: 31.5 pg (ref 26.0–34.0)
MCHC: 34.9 g/dL (ref 30.0–36.0)
MCV: 90.2 fL (ref 78.0–100.0)
PLATELETS: 174 10*3/uL (ref 150–400)
RBC: 2.54 MIL/uL — AB (ref 4.22–5.81)
RDW: 14.1 % (ref 11.5–15.5)
WBC: 7.9 10*3/uL (ref 4.0–10.5)

## 2014-04-24 LAB — GLUCOSE, CAPILLARY
GLUCOSE-CAPILLARY: 249 mg/dL — AB (ref 70–99)
GLUCOSE-CAPILLARY: 191 mg/dL — AB (ref 70–99)
GLUCOSE-CAPILLARY: 228 mg/dL — AB (ref 70–99)
Glucose-Capillary: 248 mg/dL — ABNORMAL HIGH (ref 70–99)
Glucose-Capillary: 241 mg/dL — ABNORMAL HIGH (ref 70–99)
Glucose-Capillary: 252 mg/dL — ABNORMAL HIGH (ref 70–99)

## 2014-04-24 MED ORDER — INSULIN ASPART 100 UNIT/ML ~~LOC~~ SOLN
0.0000 [IU] | Freq: Three times a day (TID) | SUBCUTANEOUS | Status: DC
  Administered 2014-04-25 (×2): 7 [IU] via SUBCUTANEOUS
  Administered 2014-04-25: 11 [IU] via SUBCUTANEOUS
  Administered 2014-04-26 (×2): 4 [IU] via SUBCUTANEOUS
  Administered 2014-04-26: 11 [IU] via SUBCUTANEOUS
  Administered 2014-04-27 – 2014-04-28 (×2): 7 [IU] via SUBCUTANEOUS
  Administered 2014-04-28 – 2014-04-29 (×2): 4 [IU] via SUBCUTANEOUS
  Administered 2014-04-29: 3 [IU] via SUBCUTANEOUS

## 2014-04-24 MED ORDER — KETOROLAC TROMETHAMINE 30 MG/ML IJ SOLN
30.0000 mg | Freq: Four times a day (QID) | INTRAMUSCULAR | Status: AC | PRN
  Administered 2014-04-24: 30 mg via INTRAVENOUS
  Filled 2014-04-24: qty 1

## 2014-04-24 NOTE — Progress Notes (Addendum)
POD#2 APR, VRAM, repair urethrotomy  Temp:  [98.2 F (36.8 C)-99.8 F (37.7 C)] 98.2 F (36.8 C) (10/31 0525) Pulse Rate:  [93-96] 94 (10/31 0525) Resp:  [19-24] 20 (10/31 0525) BP: (82-135)/(44-72) 131/67 mmHg (10/31 0525) SpO2:  [91 %-95 %] 94 % (10/31 1101) FiO2 (%):  [38 %-44 %] 38 % (10/31 0421)  JP 175 /25   PE: Drains serosanguinous Flap soft viable with decreased drainage abd distended, soft, incisional VAC in place  A/P: D/c JP #2 (SQ drain). Reviewed with pt as he becomes more mobile, OOB, will have more drainage from perinuem. Change dressing to perineum q shift and PRN saturation. Once more mobile, can leave open to air for times to reduce maceration  Irene Limbo, MD [facility] [phone]

## 2014-04-24 NOTE — Progress Notes (Signed)
2 Days Post-Op Subjective: He reports having difficulty ambulating due to postoperative pain but is currently not having any nausea or vomiting. He is tolerating his Foley catheter.  Objective: Vital signs in last 24 hours: Temp:  [98.2 F (36.8 C)-99.8 F (37.7 C)] 98.2 F (36.8 C) (10/31 0525) Pulse Rate:  [92-96] 94 (10/31 0525) Resp:  [16-24] 20 (10/31 0525) BP: (82-135)/(44-76) 131/67 mmHg (10/31 0525) SpO2:  [91 %-100 %] 93 % (10/31 0525) Arterial Line BP: (117)/(62) 117/62 mmHg (10/30 0800) FiO2 (%):  [38 %-44 %] 38 % (10/31 0421)  Intake/Output from previous day: 10/30 0701 - 10/31 0700 In: 176.7 [I.V.:96.7] Out: 1450 [Urine:1250; Drains:200] Intake/Output this shift:    Physical Exam:  His catheter is draining and his urine is clear.  Lab Results:  Recent Labs  04/22/14 1521 04/23/14 0545 04/24/14 0420  HGB 8.5* 10.1* 8.0*  HCT 25.0* 28.6* 22.9*   BMET  Recent Labs  04/23/14 0545 04/24/14 0420  NA 136* 133*  K 4.5 3.8  CL 100 98  CO2 22 26  GLUCOSE 299* 239*  BUN 13 14  CREATININE 0.89 0.86  CALCIUM 8.0* 8.0*   No results found for this basename: LABPT, INR,  in the last 72 hours No results found for this basename: LABURIN,  in the last 72 hours Results for orders placed during the hospital encounter of 04/22/14  MRSA PCR SCREENING     Status: Abnormal   Collection Time    04/22/14  7:18 PM      Result Value Ref Range Status   MRSA by PCR INVALID RESULTS, SPECIMEN SENT FOR CULTURE (*) NEGATIVE Final   Comment:            The GeneXpert MRSA Assay (FDA     approved for NASAL specimens     only), is one component of a     comprehensive MRSA colonization     surveillance program. It is not     intended to diagnose MRSA     infection nor to guide or     monitor treatment for     MRSA infections.     DAVIDSON [facility] RN 04/22/2014 2116 BY BOVELL,T.    Studies/Results: No results found.  Assessment/Plan: He appears to be doing well  postoperatively. Ureterotomy's were closed and drains are in place with continued decrease of drain output. There does not appear to be any evidence of leakage from his urethral closures.  From a urologic standpoint his drains can be removed when appropriate per general surgery.  Foley catheter will remain for 4 weeks postoperatively.   LOS: 2 days   Kaitlynd Phillips C 04/24/2014, 7:01 AM

## 2014-04-24 NOTE — Progress Notes (Signed)
2 Days Post-Op APR and VRAM flap Subjective: Doing ok.  Pain better.  Blood glucose still elevated.    Objective: Vital signs in last 24 hours: Temp:  [98.2 F (36.8 C)-99.8 F (37.7 C)] 98.2 F (36.8 C) (10/31 0525) Pulse Rate:  [93-96] 94 (10/31 0525) Resp:  [19-24] 20 (10/31 0525) BP: (82-135)/(44-76) 131/67 mmHg (10/31 0525) SpO2:  [91 %-100 %] 93 % (10/31 0525) FiO2 (%):  [38 %-44 %] 38 % (10/31 0421)   Intake/Output from previous day: 10/30 0701 - 10/31 0700 In: 176.7 [I.V.:96.7] Out: 1450 [Urine:1250; Drains:200] Intake/Output this shift:   General appearance: alert and cooperative GI: normal findings: liver span normal to percussion and appropriately tender JP: SS Ostomy pink and viable Incision: wound vac in place, no significant erythema Flap well perfused with good cap refill Lab Results:   Recent Labs  04/23/14 0545 04/24/14 0420  WBC 9.1 7.9  HGB 10.1* 8.0*  HCT 28.6* 22.9*  PLT 223 174   BMET  Recent Labs  04/23/14 0545 04/24/14 0420  NA 136* 133*  K 4.5 3.8  CL 100 98  CO2 22 26  GLUCOSE 299* 239*  BUN 13 14  CREATININE 0.89 0.86  CALCIUM 8.0* 8.0*   PT/INR No results found for this basename: LABPROT, INR,  in the last 72 hours ABG  Recent Labs  04/22/14 1521  PHART 7.373  HCO3 20.7    MEDS, Scheduled . alvimopan  12 mg Oral BID  . atorvastatin  80 mg Oral QPM  . citalopram  40 mg Oral q morning - 10a  . efavirenz-emtricitabine-tenofovir  1 tablet Oral QHS  . enoxaparin (LOVENOX) injection  40 mg Subcutaneous Q24H  . insulin aspart  0-20 Units Subcutaneous Q4H  . insulin aspart  0-5 Units Subcutaneous QHS  . insulin glargine  10 Units Subcutaneous QHS  . losartan  100 mg Oral QPM  . morphine   Intravenous 6 times per day  . traZODone  200 mg Oral QHS    Studies/Results: No results found.  Assessment: s/p Procedure(s): LAPAROSCOPIC  ABDOMINAL PERINEAL RESECTION, CLOSURE OF URETEROTOMY AND BLADDER IRRIGATION VRAM  FLAP Patient Active Problem List   Diagnosis Date Noted  . 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 Cancer of anal canal 01/21/2014  . Erectile dysfunction 04/17/2013  . Diabetes type 2, uncontrolled 04/17/2013  . 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 Hyperlipidemia 09/13/2009  . HYPERTENSION, BENIGN 09/13/2009  . CAD, NATIVE VESSEL 09/13/2009    Plan: cont NPO until bowel function OOB today SSI switched to Q4h with lantus qhs cont IV Morphine for added pain control  Wean O2 Ambulate TID   LOS: 2 days     .Rosario Adie, MD [facility] Surgery, [place]   04/24/2014 8:06 AM

## 2014-04-25 LAB — BASIC METABOLIC PANEL
ANION GAP: 11 (ref 5–15)
BUN: 12 mg/dL (ref 6–23)
CHLORIDE: 102 meq/L (ref 96–112)
CO2: 25 mEq/L (ref 19–32)
Calcium: 7.9 mg/dL — ABNORMAL LOW (ref 8.4–10.5)
Creatinine, Ser: 0.74 mg/dL (ref 0.50–1.35)
GFR calc non Af Amer: >90 mL/min (ref >90)
Glucose, Bld: 250 mg/dL — ABNORMAL HIGH (ref 70–99)
POTASSIUM: 3.9 meq/L (ref 3.7–5.3)
Sodium: 138 mEq/L (ref 137–147)

## 2014-04-25 LAB — GLUCOSE, CAPILLARY
GLUCOSE-CAPILLARY: 223 mg/dL — AB (ref 70–99)
GLUCOSE-CAPILLARY: 106 mg/dL — AB (ref 70–99)
Glucose-Capillary: 233 mg/dL — ABNORMAL HIGH (ref 70–99)

## 2014-04-25 LAB — CBC
HCT: 21.6 % — ABNORMAL LOW (ref 39.0–52.0)
Hemoglobin: 7.7 g/dL — ABNORMAL LOW (ref 13.0–17.0)
MCH: 31.8 pg (ref 26.0–34.0)
MCHC: 35.6 g/dL (ref 30.0–36.0)
MCV: 89.3 fL (ref 78.0–100.0)
Platelets: 179 10*3/uL (ref 150–400)
RBC: 2.42 MIL/uL — AB (ref 4.22–5.81)
RDW: 14.3 % (ref 11.5–15.5)
WBC: 7.3 10*3/uL (ref 4.0–10.5)

## 2014-04-25 LAB — CREATININE, FLUID (PLEURAL, PERITONEAL, JP DRAINAGE): Creat, Fluid: 0.7 mg/dL

## 2014-04-25 LAB — MRSA CULTURE

## 2014-04-25 MED ORDER — INSULIN GLARGINE 100 UNIT/ML ~~LOC~~ SOLN
20.0000 [IU] | Freq: Every day | SUBCUTANEOUS | Status: DC
  Administered 2014-04-25 – 2014-04-27 (×3): 20 [IU] via SUBCUTANEOUS
  Filled 2014-04-25 (×4): qty 0.2

## 2014-04-25 NOTE — Progress Notes (Signed)
Patient ID: Julian Palmer, male   DOB: 1960-08-12, 53 y.o.   MRN: [id number]  Subjective: The patient reports associated with movement but absent when lying still in bed. He is tolerating his catheter. He does not report any symptoms to suggest bladder spasms.  Objective: Vital signs in last 24 hours: Temp:  [97.8 F (36.6 C)-98 F (36.7 C)] 98 F (36.7 C) (11/01 0641) Pulse Rate:  [87-92] 92 (11/01 0641) Resp:  [18-24] 18 (11/01 0641) BP: (127-129)/(63-82) 128/82 mmHg (11/01 0641) SpO2:  [92 %-96 %] 94 % (11/01 0641)A  Intake/Output from previous day: 10/31 0701 - 11/01 0700 In: 3600 [I.V.:3600] Out: 2995 [Urine:2550; Drains:445] Intake/Output this shift:    Past Medical History  Diagnosis Date  . S/p bare metal coronary artery stent     1998  pLAD  . Type 2 diabetes mellitus   . Hypertension   . 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 Hyperlipidemia   . History of carcinoma in situ of anal canal     2010  . ED (erectile dysfunction)   . Sigmoid diverticulosis   . PONV (postoperative nausea and vomiting)   . Wears glasses   . At risk for sleep apnea     STOP-BANG= 4     SENT TO PCP 03-23-2014  . Coronary artery disease CARDIOLOGIST--  DR Rockey Situ --  LAST LOV 04-17-2013    S/P  PCI to mid and distal LAD and BM stent x1 to pLAD  . Anxiety   . Depression   . Recurrent squamous cell carcinoma of anus     removal mass 01-08-2014  . 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 HIV positive     Physical Exam:  Lungs - Normal respiratory effort, chest expands symmetrically.  Abdomen - Soft, appropriately tender & non-distended. His Foley catheter is in place and draining clear urine.  Lab Results:  Recent Labs  04/23/14 0545 04/24/14 0420 04/25/14 0505  WBC 9.1 7.9 7.3  HGB 10.1* 8.0* 7.7*  HCT 28.6* 22.9* 21.6*    BMET  Recent Labs  04/24/14 0420 04/25/14 0505  NA 133* 138  K 3.8 3.9  CL 98 102  CO2 26 25  GLUCOSE 239* 250*  BUN 14 12  CREATININE 0.86 0.74  CALCIUM 8.0* 7.9*   No results for input(s): LABURIN in the last 72 hours. Results for orders placed or performed during the hospital encounter of 04/22/14  MRSA PCR Screening     Status: Abnormal   Collection Time: 04/22/14  7:18 PM  Result Value Ref Range Status   MRSA by PCR INVALID RESULTS, SPECIMEN SENT FOR CULTURE (A) NEGATIVE Final    Comment:        The GeneXpert MRSA Assay (FDA approved for NASAL specimens only), is one component of a comprehensive MRSA colonization surveillance program. It is not intended to diagnose MRSA infection nor to guide or monitor treatment for MRSA infections. DAVIDSON [facility] RN 04/22/2014 2116 BY BOVELL,T.  MRSA culture     Status: None (Preliminary result)   Collection Time: 04/22/14  7:18 PM  Result Value Ref Range Status   Specimen Description NOSE  Final   Special Requests NONE  Final   Culture   Final    Culture reincubated for better growth Performed at [facility]  Report Status PENDING  Incomplete    Studies/Results: No results found.  Assessment: He appears to be doing well after closure of his urethra meatus. His drain output has picked up slightly although with fairly significant raw surfaces I suspect this is going to be serous fluid. I am however going to send fluid for creatinine just in case.  Plan: JP drainage for check of creatinine level. Continue Foley catheter upon discharge.  Esdras Delair C 04/25/2014, 7:27 AM

## 2014-04-25 NOTE — Consult Note (Signed)
WOC ostomy consult note Stoma type/location: LLQ colostomy Stomal assessment/size: 1 and 3/8 inches round, moist, budded.  Os at center. Peristomal assessment: intact,  Pouch is leaking; effluent is on skin but there is no residual erythema or parastomal skin damage. Treatment options for stomal/peristomal skin: skin barrier ring is placed today. Output Thin, dark brown/green effluent. 127ml emptied today. Ostomy pouching:2pc., 2 and 1/4 pouching system with skin barrier ring placed today after pouch removal and skin cleansing. Patient is not able to visualize stoma well in the supine position. Sister and brother-in-law are visiting today and they leave the room (per the patient's request) for the pouch change procedure.  I have clarified with patient that he would prefer to have his sister learn on a model rather than on himself for teaching sessions.  He does not mind if she is present for sessions where we are "just talking" (teaching). Education provided: Patient is asking appropriate questions about leaks and is reminded that (for now), the midline NPWT device and dressing are quite close to the stoma.  He is in some pain and does not feel as if the morphine is working for him.  I will pass this info along to the bedside RN. I will see the patient tomorrow. [place] nursing team will follow, and will remain available to this patient, the nursing, srugical and medical teams.  Thanks, Maudie Flakes, MSN, RN, [place], [place], [place] [phone])

## 2014-04-25 NOTE — Progress Notes (Signed)
POD#3 APR, VRAM, repair urethrotomy  Temp:  [97.8 F (36.6 C)-98 F (36.7 C)] 98 F (36.7 C) (11/01 0641) Pulse Rate:  [87-92] 92 (11/01 0641) Resp:  [16-24] 16 (11/01 0751) BP: (127-129)/(63-82) 128/82 mmHg (11/01 0641) SpO2:  [92 %-96 %] 93 % (11/01 0751)  JP   Started on clears this am Cr level JP pending Notes ostomy leaked - feels comfortable with ostomy care  PE: Drain serosanguinous Flap soft  abd soft, incisional VAC in place  A/P: DM not controlled- on SSI and increased Lantus today  Counseled pt at some point needs to let his sister examine wound, ostomy so that she can aid with care at home  Irene Limbo, MD [facility] Re[facility] [phone]

## 2014-04-25 NOTE — Progress Notes (Addendum)
3 Days Post-Op APR and VRAM flap Subjective: Doing ok.  Pain controlled.  Blood glucose still elevated.    Objective: Vital signs in last 24 hours: Temp:  [97.8 F (36.6 C)-98 F (36.7 C)] 98 F (36.7 C) (11/01 0641) Pulse Rate:  [87-92] 92 (11/01 0641) Resp:  [16-24] 16 (11/01 0751) BP: (127-129)/(63-82) 128/82 mmHg (11/01 0641) SpO2:  [92 %-96 %] 93 % (11/01 0751)   Intake/Output from previous day: 10/31 0701 - 11/01 0700 In: 3600 [I.V.:3600] Out: 2995 [Urine:2550; Drains:445] Intake/Output this shift:   General appearance: alert and cooperative GI:  appropriately tender JP: SS Ostomy pink and viable Incision: wound vac in place, no significant erythema (all readings >200)  Lab Results:   Recent Labs  04/24/14 0420 04/25/14 0505  WBC 7.9 7.3  HGB 8.0* 7.7*  HCT 22.9* 21.6*  PLT 174 179   BMET  Recent Labs  04/24/14 0420 04/25/14 0505  NA 133* 138  K 3.8 3.9  CL 98 102  CO2 26 25  GLUCOSE 239* 250*  BUN 14 12  CREATININE 0.86 0.74  CALCIUM 8.0* 7.9*   PT/INR No results for input(s): LABPROT, INR in the last 72 hours. ABG  Recent Labs  04/22/14 1521  PHART 7.373  HCO3 20.7    MEDS, Scheduled . alvimopan  12 mg Oral BID  . atorvastatin  80 mg Oral QPM  . citalopram  40 mg Oral q morning - 10a  . efavirenz-emtricitabine-tenofovir  1 tablet Oral QHS  . enoxaparin (LOVENOX) injection  40 mg Subcutaneous Q24H  . insulin aspart  0-20 Units Subcutaneous TID WC  . insulin aspart  0-5 Units Subcutaneous QHS  . insulin glargine  20 Units Subcutaneous QHS  . losartan  100 mg Oral QPM  . morphine   Intravenous 6 times per day  . traZODone  200 mg Oral QHS    Studies/Results: No results found.  Assessment: s/p Procedure(s): LAPAROSCOPIC  ABDOMINAL PERINEAL RESECTION, CLOSURE OF URETEROTOMY AND BLADDER IRRIGATION VRAM FLAP Patient Active Problem List   Diagnosis Date Noted  . 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 Cancer of anal canal 01/21/2014  .  Erectile dysfunction 04/17/2013  . Diabetes type 2, uncontrolled 04/17/2013  . 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 Hyperlipidemia 09/13/2009  . HYPERTENSION, BENIGN 09/13/2009  . CAD, NATIVE VESSEL 09/13/2009    Plan: Will start some clears today OOB today Cont SSI Q4h with lantus qhs, increased lantus to 20u given no response to 10u yesterday cont IV Morphine for added pain control  Wean O2 Ambulate TID   LOS: 3 days     .Rosario Adie, MD [facility], [place]   04/25/2014 9:24 AM

## 2014-04-26 ENCOUNTER — Encounter (HOSPITAL_COMMUNITY): Payer: Self-pay | Admitting: General Surgery

## 2014-04-26 LAB — GLUCOSE, CAPILLARY
GLUCOSE-CAPILLARY: 285 mg/dL — AB (ref 70–99)
Glucose-Capillary: 296 mg/dL — ABNORMAL HIGH (ref 70–99)
Glucose-Capillary: 199 mg/dL — ABNORMAL HIGH (ref 70–99)
Glucose-Capillary: 166 mg/dL — ABNORMAL HIGH (ref 70–99)
Glucose-Capillary: 161 mg/dL — ABNORMAL HIGH (ref 70–99)

## 2014-04-26 MED ORDER — MORPHINE SULFATE 2 MG/ML IJ SOLN
2.0000 mg | INTRAMUSCULAR | Status: DC | PRN
  Administered 2014-04-27: 2 mg via INTRAVENOUS
  Filled 2014-04-26: qty 1

## 2014-04-26 MED ORDER — OXYCODONE HCL 5 MG PO TABS
5.0000 mg | ORAL_TABLET | ORAL | Status: DC | PRN
  Administered 2014-04-26 – 2014-04-28 (×9): 10 mg via ORAL
  Filled 2014-04-26 (×10): qty 2

## 2014-04-26 NOTE — Progress Notes (Signed)
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  PMHx:  HIV+ (family is unaware of this dx), MI with stent    PT Comments    Patient was able to increase tolerance to upright position this session and initiate gait training.  BP was monitored from baseline and while standing post gait; range 124/67 to 114/53 respectively.  Patient had no c/o dizziness or nausea, just fatigue.  He was min A +2 for all mobility.  Patient continues to c/o increased pain.    Follow Up Recommendations  No PT follow up     Equipment Recommendations  None recommended by PT    Recommendations for Other Services       Precautions / Restrictions Precautions Precautions: Fall Precaution Comments: 1 JP drains, colostomy, multiple other lines at time of eval; monitor BP Restrictions Weight Bearing Restrictions: No    Mobility  Bed Mobility Overal bed mobility: Needs Assistance Bed Mobility: Supine to Sit Rolling: Min guard Sidelying to sit: Min assist;HOB elevated Supine to sit: Min assist;+2 for physical assistance   Sit to sidelying: Min assist General bed mobility comments: HOB significantly elevated; handheld A and LE A  Transfers Overall transfer level: Needs assistance Equipment used: Rolling walker (2 wheeled) Transfers: Sit to/from Stand Sit to Stand: Min assist;+2 safety/equipment         General transfer comment: VCs for hand placement and sequencing; no c/o dizziness  Ambulation/Gait Ambulation/Gait assistance: Min assist;+2 safety/equipment Ambulation Distance (Feet): 25 Feet Assistive device: Rolling walker (2 wheeled) Gait Pattern/deviations: Step-through  pattern;Decreased step length - left;Decreased step length - right Gait velocity: decreased   General Gait Details: VCs for RW management and sequencing; pt required some manual A for RW management around obstacles; pt requires increased time; no c/o dizziness, just fatigue; vitals monitored   Stairs            Wheelchair Mobility    Modified Rankin (Stroke Patients Only)       Balance                                    Cognition Arousal/Alertness: Awake/alert Behavior During Therapy: WFL for tasks assessed/performed Overall Cognitive Status: Within Functional Limits for tasks assessed                      Exercises      General Comments        Pertinent Vitals/Pain Pain Assessment: 0-10 Pain Score: 6  Pain Location: abdomen Pain Descriptors / Indicators: Aching Pain Intervention(s): Monitored during session;Repositioned;RN gave pain meds during session    Home Living                      Prior Function            PT Goals (current goals can now be found in the care plan section) Acute Rehab PT Goals Patient Stated Goal: decrease pain Progress towards PT goals: Progressing toward goals    Frequency  Min 3X/week    PT Plan Current plan remains appropriate    Co-evaluation  End of Session Equipment Utilized During Treatment: Gait belt Activity Tolerance: Patient tolerated treatment well Patient left: in chair;with call bell/phone within reach;with family/visitor present     Time: 6438-3779 PT Time Calculation (min): 23 min  Charges:                       G Codes:      Miller,Derrick, SPTA 04/26/2014, 12:37 PM   reviewed above  Rica Koyanagi  PTA [facility]  Acute  Rehab Pager      [phone]

## 2014-04-26 NOTE — Progress Notes (Signed)
4 Days Post-Op APR and VRAM flap Subjective: Doing ok.  Pain controlled.  Blood glucose still elevated.    Objective: Vital signs in last 24 hours: Temp:  [98.1 F (36.7 C)-99.3 F (37.4 C)] 99 F (37.2 C) (11/02 0625) Pulse Rate:  [77-86] 84 (11/02 0625) Resp:  [13-18] 13 (11/02 0808) BP: (108-125)/(64-71) 113/67 mmHg (11/02 0625) SpO2:  [94 %-99 %] 94 % (11/02 0808) FiO2 (%):  [28 %] 28 % (11/01 1601)   Intake/Output from previous day: 11/01 0701 - 11/02 0700 In: 1855.8 [P.O.:900; I.V.:955.8] Out: 4505 [Urine:3825; Drains:130; Stool:550] Intake/Output this shift:   General appearance: alert and cooperative GI:  appropriately tender JP: SS Ostomy pink and viable Incision: wound vac in place, no significant erythema (all readings >200)  Lab Results:   Recent Labs  04/24/14 0420 04/25/14 0505  WBC 7.9 7.3  HGB 8.0* 7.7*  HCT 22.9* 21.6*  PLT 174 179   BMET  Recent Labs  04/24/14 0420 04/25/14 0505  NA 133* 138  K 3.8 3.9  CL 98 102  CO2 26 25  GLUCOSE 239* 250*  BUN 14 12  CREATININE 0.86 0.74  CALCIUM 8.0* 7.9*   PT/INR No results for input(s): LABPROT, INR in the last 72 hours. ABG No results for input(s): PHART, HCO3 in the last 72 hours.  Invalid input(s): PCO2, PO2  MEDS, Scheduled . alvimopan  12 mg Oral BID  . atorvastatin  80 mg Oral QPM  . citalopram  40 mg Oral q morning - 10a  . efavirenz-emtricitabine-tenofovir  1 tablet Oral QHS  . enoxaparin (LOVENOX) injection  40 mg Subcutaneous Q24H  . insulin aspart  0-20 Units Subcutaneous TID WC  . insulin aspart  0-5 Units Subcutaneous QHS  . insulin glargine  20 Units Subcutaneous QHS  . losartan  100 mg Oral QPM  . morphine   Intravenous 6 times per day  . traZODone  200 mg Oral QHS    Studies/Results: No results found.  Assessment: s/p Procedure(s): LAPAROSCOPIC  ABDOMINAL PERINEAL RESECTION, CLOSURE OF URETEROTOMY AND BLADDER IRRIGATION VRAM FLAP Patient Active Problem List   Diagnosis Date Noted  . 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 Cancer of anal canal 01/21/2014  . Erectile dysfunction 04/17/2013  . Diabetes type 2, uncontrolled 04/17/2013  . 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 Hyperlipidemia 09/13/2009  . HYPERTENSION, BENIGN 09/13/2009  . CAD, NATIVE VESSEL 09/13/2009    Plan: Advance to soft diet OOB today Cont SSI Q4h with lantus qhs, increased lantus to 20u given no response to 10u  Will d/c pca and start po narcotics Ambulate TID   LOS: 4 days     .Julian Adie, MD [facility], [place]   04/26/2014 8:11 AM

## 2014-04-26 NOTE — Plan of Care (Signed)
Problem: Phase II Progression Outcomes Goal: Return of bowel function (flatus, BM) IF ABDOMINAL SURGERY:  Outcome: Completed/Met Date Met:  04/26/14  Problem: Phase III Progression Outcomes Goal: Nasogastric tube discontinued Outcome: Not Applicable Date Met:  09/32/67

## 2014-04-26 NOTE — Progress Notes (Signed)
Inpatient Diabetes Program Recommendations  AACE/ADA: New Consensus Statement on Inpatient Glycemic Control (2013)  Target Ranges:  Prepandial:   less than 140 mg/dL      Peak postprandial:   less than 180 mg/dL (1-2 hours)      Critically ill patients:  140 - 180 mg/dL   Reason for Visit: Hyperglycemia  Results for STRATTON, VILLWOCK (MRN [id number]) as of 04/26/2014 13:54  Ref. Range 04/25/2014 13:01 04/25/2014 16:52 04/25/2014 20:02 04/26/2014 07:40 04/26/2014 12:34  Glucose-Capillary Latest Range: 70-99 mg/dL 296 (H) 233 (H) 106 (H) 199 (H) 285 (H)   FL diet started this am. Lantus 20 units given last night. FBS improved to < 200mg /dL. When diet is advanced to CHO mod med, may need meal coverage insulin. If post-prandial blood sugars > 180 mg/dL, consider addition of Novolog 4 units tidwc if eating >50% meal. Will need to f/u with PCP regarding HgbA1C update when H/H near normal.  Will continue to follow. Thank you. Lorenda Peck, RD, LDN, CDE Inpatient Diabetes Coordinator [phone]

## 2014-04-26 NOTE — Consult Note (Signed)
WOC ostomy follow up Stoma type/location: LLQ Colostomy Stomal assessment/size: 1 and 3/8 inches round Peristomal assessment: Not seen today Treatment options for stomal/peristomal skin: Skin barrier ring Output dark brown effluent Ostomy pouching: 2pc.  Education provided: Patient reports that pain in dramatically better with new pain med. Walked a bit with PT.  Encouraged to ambulate with pain medication. Enrolled patient in [place] Start Discharge program: No [facility] nursing team will follow, and will remain available to this patient, the nursing, surgical and medical teams.  Will see tomorrow. Thanks, Maudie Flakes, MSN, RN, [place], [place], [place] [phone])

## 2014-04-26 NOTE — Progress Notes (Signed)
POD#4 APR, VRAM, repair urethrotomy  Temp:  [98.1 F (36.7 C)-99.5 F (37.5 C)] 99.5 F (37.5 C) (11/02 1000) Pulse Rate:  [77-86] 85 (11/02 1000) Resp:  [13-18] 16 (11/02 1000) BP: (108-138)/(64-76) 114/64 mmHg (11/02 1108) SpO2:  [90 %-99 %] 96 % (11/02 1108) FiO2 (%):  [28 %] 28 % (11/01 1601)  JP 130 PO 900 States he was able to ambulate to door Nursing placing barrier cream to buttocks- asked that none be applied to flap itself  PE: Sitting in recliner- exam deferred  A/P: Path pending. OOB, ambulate  Irene Limbo, MD [facility] [phone]

## 2014-04-26 NOTE — Plan of Care (Signed)
Problem: Phase II Progression Outcomes Goal: Pain controlled Outcome: Progressing                  

## 2014-04-26 NOTE — Progress Notes (Addendum)
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  PMHx:  HIV+ (family is unaware of this dx), MI with stent   OT comments  Pt transferred to EOB but required 2 attempts as he was very dizzy on first attempt. Sat EOB for some grooming and bathing tasks. Nursing tech present to assist also. Pt stating he still felt dizzy at EOB and in standing for BP readings but not as bad as when he first sat up. Pt stood for approximately 1-2 minutes for BP reading and self care and then requesting to sit back down due to pain.  BP readings below.    Follow Up Recommendations  Supervision/Assistance - 24 hour;SNF;Other (comment) (depending on if pt able to progress to home with family versus needing SNF at d/c. )    Equipment Recommendations  None recommended by OT    Recommendations for Other Services      Precautions / Restrictions Precautions Precaution Comments: 2 JP drains, colostomy, multiple other lines at time of eval; monitor BP Restrictions Weight Bearing Restrictions: No       Mobility Bed Mobility Overal bed mobility: Needs Assistance Bed Mobility: Rolling;Sit to Sidelying;Sidelying to Sit Rolling: Min guard Sidelying to sit: Min assist;HOB elevated     Sit to sidelying: Min assist General bed mobility comments: HOB raised and use of rail. cues for technique.  Transfers Overall transfer level: Needs assistance Equipment used: Rolling walker (2 wheeled) Transfers: Sit to/from Stand Sit to Stand: Min guard         General transfer comment: stood before OT fully ready for pt to stand. Held to walker in standing for BP reading. MIn guard for safety but able to  rise to standing without LOB.    Balance                                   ADL       Grooming: Min guard;Sitting;Wash/dry face                                 General ADL Comments: Pt wanting to get up to chair. He required 2 attemps for sitting up on EOB as he was very dizzy on first attempt. Pt rested in bed and reattempted and stated dizzy was alittle better on second attempt. Pt washed his face at EOB but is shaky and reports pain is 9/10. PCA encouraged. Pt stood at EOB and is also very shaky during BP reading. Pt requesting to sit back down and have periarea cleaned from bed level. Did not get up to chair as pt reporting pain is high, dizzy and BP reading in standing 157/139. Assisted to supine and nursing tech assisted with periarea hygiene. Discussed possiblity of needing SNF at d/c if unable to progress with functional mobility and pt states he is hopeful for home but realistic he may need more assist.       Vision                     Perception  Praxis      Cognition   Behavior During Therapy: WFL for tasks assessed/performed Overall Cognitive Status: Within Functional Limits for tasks assessed                       Extremity/Trunk Assessment               Exercises     Shoulder Instructions       General Comments      Pertinent Vitals/ Pain       Pain Assessment: 0-10 Pain Score: 9  Pain Location: abdomen Pain Descriptors / Indicators: Aching Pain Intervention(s): PCA encouraged   BP supine 130/71 Sitting EOB 138/76 and O2 on 2L 96% HR 92 Standing BP 157/139--informed nursing.  BP back in supine after resting 114/64  Home Living                                          Prior Functioning/Environment              Frequency Min 2X/week     Progress Toward Goals  OT Goals(current goals can now be found in the care plan section)  Progress towards OT goals: Not progressing  toward goals - comment (limited by pain and dizziness)  Acute Rehab OT Goals Patient Stated Goal: decrease pain  Plan Discharge plan needs to be updated    Co-evaluation                 End of Session Equipment Utilized During Treatment: Rolling walker   Activity Tolerance Patient limited by pain   Patient Left in bed;with call bell/phone within reach;with family/visitor present (family returned at end of session)   Nurse Communication          Time: 1010-1050 OT Time Calculation (min): 40 min  Charges: OT General Charges $OT Visit: 1 Procedure OT Treatments $Self Care/Home Management : 23-37 mins $Therapeutic Activity: 8-22 mins  [name]  [phone] 04/26/2014, 11:18 AM

## 2014-04-26 NOTE — Consult Note (Signed)
WOC ostomy consult note Stoma type/location: LLQ, colostomy Stomal assessment/size: aprx. 1 3/8" round, appears budded, pink and moist. Patient is not feeling well at the time of my visit today, request no pouch change today.  Output none Ostomy pouching: 1pc in place from OR, however we had long discussion about options for pouching and I showed him the two piece pouching system today.  Education provided:  Lengthy conversation with patient and his sister today at the bedside.  This patient has spent a great deal of time on the Internet researching the care of an ostomy.  He has joined several on-line groups for discussion and support.  We talked about his diet, the care of his stoma, and accessories he was inquiring about to manage his ostomy.  This patient has been advised that every ostomy patient is not the same and while the internet blogs and support groups are a good way to learn more they can also provide him with information that may be inaccurate. I have answered many of his questions today and will have a member of the [place] team follow up with him either Sunday or Monday for additional teaching.   [place] team will follow along with you for support and ostomy teaching   Ashyla Luth Blairs RN,CWOCN [phone]

## 2014-04-26 NOTE — Progress Notes (Signed)
Patient ID: Julian Palmer, male   DOB: [birth year], 53 y.o.   MRN: [id number]  POD#4 - patient doing well.   Physical exam: Patient looks well. Urine clear in tubing, Foley in place  Impression - POD#4 APR, VRAM, repair urethrotomy - Plan - -continue Foley catheter for 3-4 weeks. -JP drain creatinine consistent with serum and no urine leak.  Drain can be removed at any time from a urologic point of view.

## 2014-04-27 LAB — GLUCOSE, CAPILLARY
GLUCOSE-CAPILLARY: 273 mg/dL — AB (ref 70–99)
GLUCOSE-CAPILLARY: 211 mg/dL — AB (ref 70–99)
Glucose-Capillary: 119 mg/dL — ABNORMAL HIGH (ref 70–99)
Glucose-Capillary: 209 mg/dL — ABNORMAL HIGH (ref 70–99)

## 2014-04-27 MED ORDER — GLIMEPIRIDE 4 MG PO TABS
4.0000 mg | ORAL_TABLET | Freq: Two times a day (BID) | ORAL | Status: DC
  Administered 2014-04-27 – 2014-04-29 (×6): 4 mg via ORAL
  Filled 2014-04-27 (×7): qty 1

## 2014-04-27 MED ORDER — CLOTRIMAZOLE 1 % EX CREA
TOPICAL_CREAM | Freq: Two times a day (BID) | CUTANEOUS | Status: DC
  Administered 2014-04-27 – 2014-04-29 (×5): via TOPICAL
  Filled 2014-04-27: qty 15

## 2014-04-27 MED ORDER — ONDANSETRON HCL 4 MG/2ML IJ SOLN
4.0000 mg | Freq: Four times a day (QID) | INTRAMUSCULAR | Status: DC | PRN
  Administered 2014-04-27: 4 mg via INTRAVENOUS
  Filled 2014-04-27: qty 2

## 2014-04-27 MED ORDER — METFORMIN HCL 500 MG PO TABS
1000.0000 mg | ORAL_TABLET | Freq: Two times a day (BID) | ORAL | Status: DC
  Administered 2014-04-27 – 2014-04-29 (×6): 1000 mg via ORAL
  Filled 2014-04-27 (×7): qty 2

## 2014-04-27 NOTE — Progress Notes (Signed)
5 Days Post-Op APR and VRAM flap Subjective: Doing ok.  Pain controlled. Tolerating soft diet.  Blood glucose still elevated but better.    Objective: Vital signs in last 24 hours: Temp:  [98.5 F (36.9 C)-99.5 F (37.5 C)] 98.9 F (37.2 C) (11/03 0545) Pulse Rate:  [64-85] 82 (11/03 0545) Resp:  [14-16] 14 (11/03 0545) BP: (110-142)/(62-84) 110/62 mmHg (11/03 0545) SpO2:  [90 %-99 %] 94 % (11/03 0545)   Intake/Output from previous day: 11/02 0701 - 11/03 0700 In: 1320 [P.O.:1080; I.V.:240] Out: 3950 [Urine:3450; Drains:125; Stool:375] Intake/Output this shift:   General appearance: alert and cooperative GI:  appropriately tender JP: SS Ostomy pink and viable Incision: wound vac in place, no significant erythema  Lab Results:   Recent Labs  04/25/14 0505  WBC 7.3  HGB 7.7*  HCT 21.6*  PLT 179   BMET  Recent Labs  04/25/14 0505  NA 138  K 3.9  CL 102  CO2 25  GLUCOSE 250*  BUN 12  CREATININE 0.74  CALCIUM 7.9*   PT/INR No results for input(s): LABPROT, INR in the last 72 hours. ABG No results for input(s): PHART, HCO3 in the last 72 hours.  Invalid input(s): PCO2, PO2  MEDS, Scheduled . atorvastatin  80 mg Oral QPM  . citalopram  40 mg Oral q morning - 10a  . efavirenz-emtricitabine-tenofovir  1 tablet Oral QHS  . enoxaparin (LOVENOX) injection  40 mg Subcutaneous Q24H  . glimepiride  4 mg Oral BID AC  . insulin aspart  0-20 Units Subcutaneous TID WC  . insulin aspart  0-5 Units Subcutaneous QHS  . insulin glargine  20 Units Subcutaneous QHS  . losartan  100 mg Oral QPM  . metFORMIN  1,000 mg Oral BID WC  . traZODone  200 mg Oral QHS    Studies/Results: No results found.  Assessment: s/p Procedure(s): LAPAROSCOPIC  ABDOMINAL PERINEAL RESECTION, CLOSURE OF URETEROTOMY AND BLADDER IRRIGATION VRAM FLAP Patient Active Problem List   Diagnosis Date Noted  . 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 Cancer of anal canal 01/21/2014  . Erectile dysfunction  04/17/2013  . Diabetes type 2, uncontrolled 04/17/2013  . 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 Hyperlipidemia 09/13/2009  . HYPERTENSION, BENIGN 09/13/2009  . CAD, NATIVE VESSEL 09/13/2009    Plan: Cont soft diet, carb controlled OOB today, Ambulate TID Cont SSI Q4h with lantus 20u qhs, restart Amaryl and metformin Cont po narcotics D/C Incisional wound vac   LOS: 5 days     .Rosario Adie, MD [facility], [place]   04/27/2014 8:33 AM

## 2014-04-27 NOTE — Progress Notes (Signed)
Pt sad at the edge of the bed, stood briefly. Pt grimaced and c/o extreme pain. Pt went back to bed. Pt stated he would try again to walk tomorrow.

## 2014-04-27 NOTE — Consult Note (Signed)
WOC ostomy follow up Stoma type/location: LLQ COlostomy Stomal assessment/size: 1 and 3/8 inches round with mild tension applied to 12 o'clock, slightly oval at rest. Peristomal assessment: intact, clear Treatment options for stomal/peristomal skin: skin barrier ring Output Brown thin Ostomy pouching: 2pc, 2 and 1/4 inches with skin barrier ring Education provided: Extended session for teaching about diet (no restrictions), pouching routine, pacing of pain medications for maximum benefit from physical therapy.  Incisional V.A.C.  Discontinued today and incision is closely approximated and free of drainage. Enrolled patient in [place] Start Discharge program: Yes [place] nursing team will follow, and will remain available to this patient, the nursing, surgical  and medical teams.   Thanks, Maudie Flakes, MSN, RN, [place], [place], [place] [phone])

## 2014-04-27 NOTE — Progress Notes (Signed)
Nursing has requested that patient walk multiple times today. Pt refuses each time. Will continue to encourage ambulation.

## 2014-04-27 NOTE — Progress Notes (Signed)
POD#5 APR, VRAM, repair urethrotomy  Temp:  [97.9 F (36.6 C)-100.5 F (38.1 C)] 100.5 F (38.1 C) (11/03 1336) Pulse Rate:  [64-87] 87 (11/03 1336) Resp:  [12-16] 12 (11/03 1336) BP: (110-142)/(62-84) 132/70 mmHg (11/03 1336) SpO2:  [94 %-99 %] 96 % (11/03 1100)  C/o pain and odor from maceration buttocks  PE: Abdomen soft, air in ostomy bag Staple line intact dry  perineum flap soft, stage 1 pressure changes and maceration buttocks  A/P: Path with invasion into pericolonic fat, 7/17 nodes positive (this was not discussed with pt by myself) Ok to shower from [employer]. Encourage to lie on side and leave buttock open to air- ok to use desitin.  Irene Limbo, MD [facility] [phone]

## 2014-04-27 NOTE — Progress Notes (Signed)
Patient ID: Julian Palmer, male   DOB: 03-05-1961, 53 y.o.   MRN: [id number]  POD#5 -   Doing well. Foley draining. Urine clear.    Intake/Output Summary (Last 24 hours) at 04/27/14 1121 Last data filed at 04/27/14 1000  Gross per 24 hour  Intake   1140 ml  Output   3480 ml  Net  -2340 ml   A/p - Continue foley.

## 2014-04-27 NOTE — Progress Notes (Signed)
Inpatient Diabetes Program Recommendations  AACE/ADA: New Consensus Statement on Inpatient Glycemic Control (2013)  Target Ranges:  Prepandial:   less than 140 mg/dL      Peak postprandial:   less than 180 mg/dL (1-2 hours)      Critically ill patients:  140 - 180 mg/dL   Reason for Visit: Hyperglycemia   Results for Julian Palmer, Julian Palmer (MRN [id number]) as of 04/27/2014 09:15  Ref. Range 04/26/2014 07:40 04/26/2014 12:34 04/26/2014 16:37 04/26/2014 22:00 04/27/2014 07:44  Glucose-Capillary Latest Range: 70-99 mg/dL 199 (H) 285 (H) 166 (H) 161 (H) 273 (H)   FBS remains elevated. Metformin and Amaryl restarted this morning.  Please consider increasing Lantus to 25 units QHS with FBS >180 mg/dL.  Will continue to follow. Thank you. Lorenda Peck, RD, LDN, CDE Inpatient Diabetes Coordinator [phone]

## 2014-04-28 LAB — GLUCOSE, CAPILLARY
GLUCOSE-CAPILLARY: 144 mg/dL — AB (ref 70–99)
Glucose-Capillary: 157 mg/dL — ABNORMAL HIGH (ref 70–99)
Glucose-Capillary: 108 mg/dL — ABNORMAL HIGH (ref 70–99)
Glucose-Capillary: 220 mg/dL — ABNORMAL HIGH (ref 70–99)

## 2014-04-28 MED ORDER — OXYCODONE HCL 5 MG PO TABS
5.0000 mg | ORAL_TABLET | ORAL | Status: DC | PRN
  Administered 2014-04-28: 15 mg via ORAL
  Administered 2014-04-28: 10 mg via ORAL
  Administered 2014-04-28: 15 mg via ORAL
  Administered 2014-04-29 (×3): 10 mg via ORAL
  Filled 2014-04-28 (×2): qty 2
  Filled 2014-04-28: qty 3
  Filled 2014-04-28 (×2): qty 2
  Filled 2014-04-28: qty 3

## 2014-04-28 MED ORDER — INSULIN STARTER KIT- PEN NEEDLES (ENGLISH)
1.0000 | Freq: Once | Status: AC
  Administered 2014-04-28: 1
  Filled 2014-04-28: qty 1

## 2014-04-28 MED ORDER — INSULIN GLARGINE 100 UNIT/ML ~~LOC~~ SOLN
25.0000 [IU] | Freq: Every day | SUBCUTANEOUS | Status: DC
  Administered 2014-04-28: 25 [IU] via SUBCUTANEOUS
  Filled 2014-04-28 (×2): qty 0.25

## 2014-04-28 MED ORDER — LIVING WELL WITH DIABETES BOOK
Freq: Once | Status: AC
  Administered 2014-04-28: 18:00:00
  Filled 2014-04-28: qty 1

## 2014-04-28 NOTE — Plan of Care (Signed)
Problem: Phase II Progression Outcomes Goal: Sutures/staples intact Outcome: Completed/Met Date Met:  04/28/14

## 2014-04-28 NOTE — Progress Notes (Signed)
6 Days Post-Op APR and VRAM flap Subjective: Doing ok.  States he can't ambulate much due to pain. Tolerating soft diet.  Blood glucose still elevated but better.    Objective: Vital signs in last 24 hours: Temp:  [98 F (36.7 C)-100.5 F (38.1 C)] 98 F (36.7 C) (11/04 0538) Pulse Rate:  [73-87] 74 (11/04 0538) Resp:  [12-20] 20 (11/04 0538) BP: (100-132)/(54-70) 100/54 mmHg (11/04 0538) SpO2:  [93 %-97 %] 93 % (11/04 0538)   Intake/Output from previous day: 11/03 0701 - 11/04 0700 In: 340 [P.O.:340] Out: 2065 [Urine:1625; Drains:65; Stool:375] Intake/Output this shift: Total I/O In: 240 [P.O.:240] Out: 610 [Urine:600; Drains:10] General appearance: alert and cooperative GI:  appropriately tender JP: SS Ostomy pink and viable Incision:  no significant erythema or drainage  Lab Results:  No results for input(s): WBC, HGB, HCT, PLT in the last 72 hours. BMET No results for input(s): NA, K, CL, CO2, GLUCOSE, BUN, CREATININE, CALCIUM in the last 72 hours. PT/INR No results for input(s): LABPROT, INR in the last 72 hours. ABG No results for input(s): PHART, HCO3 in the last 72 hours.  Invalid input(s): PCO2, PO2  MEDS, Scheduled . atorvastatin  80 mg Oral QPM  . citalopram  40 mg Oral q morning - 10a  . clotrimazole   Topical BID  . efavirenz-emtricitabine-tenofovir  1 tablet Oral QHS  . enoxaparin (LOVENOX) injection  40 mg Subcutaneous Q24H  . glimepiride  4 mg Oral BID AC  . insulin aspart  0-20 Units Subcutaneous TID WC  . insulin aspart  0-5 Units Subcutaneous QHS  . insulin glargine  25 Units Subcutaneous QHS  . losartan  100 mg Oral QPM  . metFORMIN  1,000 mg Oral BID WC  . traZODone  200 mg Oral QHS    Studies/Results: No results found.  Assessment: s/p Procedure(s): LAPAROSCOPIC  ABDOMINAL PERINEAL RESECTION, CLOSURE OF URETEROTOMY AND BLADDER IRRIGATION VRAM FLAP Patient Active Problem List   Diagnosis Date Noted  . 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  Cancer of anal canal 01/21/2014  . Erectile dysfunction 04/17/2013  . Diabetes type 2, uncontrolled 04/17/2013  . 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 Hyperlipidemia 09/13/2009  . HYPERTENSION, BENIGN 09/13/2009  . CAD, NATIVE VESSEL 09/13/2009    Plan: Cont soft diet, carb controlled OOB today, Ambulate TID Cont SSI Q4h with lantus (increase to 25u qhs), cont Amaryl and metformin Cont po narcotics, will increase dose May need snf if doesn't start ambulating better.    LOS: 6 days     .Rosario Adie, MD [facility], [place]   04/28/2014 11:16 AM

## 2014-04-28 NOTE — Progress Notes (Signed)
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  PMHx:  HIV+ (family is unaware of this dx), MI with stent    PT Comments    Patient was able to increase gait tolerance this session to 40 feet.  His activity remains limited due to general weakness and dizziness; BP dropped to 105/59 during gait but was WNL at baseline and sitting on EOB.  Patient in min A with all mobility but requires +2 for gait for equipment/recliner.  Per MD request, patient was instructed to position in sidelying as much as possible; patient was positioned in recliner with pillows on each side.  Follow Up Recommendations  No PT follow up     Equipment Recommendations  None recommended by PT    Recommendations for Other Services       Precautions / Restrictions Precautions Precautions: Fall Restrictions Weight Bearing Restrictions: No    Mobility  Bed Mobility Overal bed mobility: Needs Assistance Bed Mobility: Supine to Sit Rolling: Min assist   Supine to sit: HOB elevated;Min assist     General bed mobility comments: HOB significantly elevated; handheld A and LE A  Transfers Overall transfer level: Needs assistance Equipment used: Rolling walker (2 wheeled) Transfers: Sit to/from Stand Sit to Stand: Min assist         General transfer comment: VCs for hand placement  Ambulation/Gait Ambulation/Gait assistance: Min assist;+2 safety/equipment Ambulation Distance (Feet): 40 Feet Assistive device: Rolling walker (2 wheeled) Gait Pattern/deviations: Step-through pattern;Decreased stride length Gait velocity: decreased   General Gait Details: VCs for RW management and upright  posture; patient c/o dizziness and was positioned in recliner; vitals monitored   Stairs            Wheelchair Mobility    Modified Rankin (Stroke Patients Only)       Balance                                    Cognition Arousal/Alertness: Awake/alert Behavior During Therapy: WFL for tasks assessed/performed Overall Cognitive Status: Within Functional Limits for tasks assessed                      Exercises      General Comments        Pertinent Vitals/Pain      Home Living                      Prior Function            PT Goals (current goals can now be found in the care plan section) Acute Rehab PT Goals Patient Stated Goal: decrease pain Progress towards PT goals: Progressing toward goals    Frequency  Min 3X/week    PT Plan Current plan remains appropriate    Co-evaluation             End of Session Equipment Utilized During Treatment: Gait belt Activity Tolerance: Patient tolerated treatment well Patient left: in chair;with call bell/phone within reach;with family/visitor present     Time: 8786-7672 PT Time Calculation (min): 20 min  Charges:  G Codes:      Miller,Derrick, SPTA 04/28/2014, 12:49 PM   Reviewed  Rica Koyanagi  PTA [facility]  Acute  Rehab Pager      [phone]

## 2014-04-28 NOTE — Consult Note (Signed)
WOC ostomy consult note Stoma type/location: LLQ Colostomy Stomal assessment/size: Not assessed today.  Pouch was changed yesterday.  Peristomal assessment: Not assessed Treatment options for stomal/peristomal skin: Barrier ring used.  Output Liquid brown stool.  Lots of gas Ostomy pouching: /2pc. 2 1/4" system and barrier ring.   Education provided:  Discussed self care interventions at length today.  Including dry powder shaving, pouch emptying and  change intervals, odor management including filtered pouches and deodorizers.  Previously enrolled in [employer].  4 pouch change systems in the room at this time.  No need to order additional supplies.  [place] team will continue to follow.  Domenic Moras RN BSN [place] Pager [phone]

## 2014-04-28 NOTE — Progress Notes (Signed)
Inpatient Diabetes Program Recommendations  AACE/ADA: New Consensus Statement on Inpatient Glycemic Control (2013)  Target Ranges:  Prepandial:   less than 140 mg/dL      Peak postprandial:   less than 180 mg/dL (1-2 hours)      Critically ill patients:  140 - 180 mg/dL   Reason for Visit: Discussion of Insulin after discharge  DM2 with HgbA1C of 8.7%. On Amaryl and metformin at home. Pt states his PCP has been discussing possibility of starting insulin to control blood sugars. Pt very open to using insulin after discharge. Prefers insulin pen over syringe. Has meter and checks blood sugars at home. Demonstrated insulin pen and instructed on use for home. Also discussed hypoglycemia s/s and treatment, and pt states he has treated hypoglycemia in the past with 1/2 cup OJ. Does not wish to attend diabetes classes at [facility]. States he reads a lot about diabetes and several members of his family have it.  Results for FAITH, BRANAN (MRN [id number]) as of 04/28/2014 11:48  Ref. Range 04/27/2014 07:44 04/27/2014 11:39 04/27/2014 17:05 04/27/2014 21:24 04/28/2014 07:37  Glucose-Capillary Latest Range: 70-99 mg/dL 273 (H) 211 (H) 119 (H) 209 (H) 157 (H)  Results for CURRAN, LENDERMAN (MRN [id number]) as of 04/28/2014 11:48  Ref. Range 04/16/2014 10:00  Hgb A1c MFr Bld Latest Range: <5.7 % 8.7 (H)   Will order Insulin Pen Starter Kit and f/u in am for pt to demonstrate use. Ordered Living Well With Diabetes book and encouraged pt to write down any questions he might have. Lantus increased to 25 units QHS today.  Recommend adding CHO mod med to soft diet.  To f/u in am. Thank you. Lorenda Peck, RD, LDN, CDE Inpatient Diabetes Coordinator [phone]

## 2014-04-29 LAB — GLUCOSE, CAPILLARY
GLUCOSE-CAPILLARY: 138 mg/dL — AB (ref 70–99)
Glucose-Capillary: 200 mg/dL — ABNORMAL HIGH (ref 70–99)
Glucose-Capillary: 156 mg/dL — ABNORMAL HIGH (ref 70–99)

## 2014-04-29 MED ORDER — OXYCODONE HCL 5 MG PO TABS: 5.0000 mg | ORAL_TABLET | ORAL | Status: DC | PRN

## 2014-04-29 MED ORDER — INSULIN GLARGINE 100 UNIT/ML ~~LOC~~ SOLN: 25.0000 [IU] | Freq: Every day | SUBCUTANEOUS | Status: DC

## 2014-04-29 MED ORDER — NITROFURANTOIN MONOHYD MACRO 100 MG PO CAPS
100.0000 mg | ORAL_CAPSULE | Freq: Every day | ORAL | Status: DC
  Filled 2014-04-29: qty 1

## 2014-04-29 NOTE — Progress Notes (Signed)
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  PMHx:  HIV+ (family is unaware of this dx), MI with stent    PT Comments    Patient has significantly increased gait tolerance this session to 160 feet without requiring any sitting rest breaks or c/o dizziness.  Pt was able to successfully initiate and complete stair training of 2 steps.  Patient continues to be limited by fatigue and abdominal pain.    Follow Up Recommendations  Spoke with pt's caregiver/sister and she now requests HHPT eval.       Equipment Recommendations  None recommended by PT    Recommendations for Other Services       Precautions / Restrictions Precautions Precautions: Fall Restrictions Weight Bearing Restrictions: No    Mobility  Bed Mobility Overal bed mobility: Needs Assistance Bed Mobility: Supine to Sit Rolling: Supervision   Supine to sit: HOB elevated        Transfers Overall transfer level: Needs assistance Equipment used: Rolling walker (2 wheeled) Transfers: Sit to/from Stand Sit to Stand: Supervision            Ambulation/Gait Ambulation/Gait assistance: Min guard Ambulation Distance (Feet): 160 Feet Assistive device: Rolling walker (2 wheeled) Gait Pattern/deviations: Step-through pattern;Decreased stride length Gait velocity: decreased   General Gait Details: VCs for upright posture; pt tends to look down constantly   Stairs Stairs: Yes Stairs assistance: Min assist Stair Management: One rail Right;Alternating pattern;Forwards Number of Stairs: 2 General stair comments: VC for hand placement and sequencing; caregiver present and educated on A for pt at  home.  Wheelchair Mobility    Modified Rankin (Stroke Patients Only)       Balance                                    Cognition Arousal/Alertness: Awake/alert Behavior During Therapy: WFL for tasks assessed/performed Overall Cognitive Status: Within Functional Limits for tasks assessed                      Exercises      General Comments        Pertinent Vitals/Pain Pain Assessment: 0-10 Pain Score: 5  Pain Location: abdomen Pain Intervention(s): Repositioned;Monitored during session    Home Living                      Prior Function            PT Goals (current goals can now be found in the care plan section) Progress towards PT goals: Progressing toward goals    Frequency  Min 3X/week    PT Plan Current plan remains appropriate    Co-evaluation             End of Session Equipment Utilized During Treatment: Gait belt Activity Tolerance: Patient tolerated treatment well Patient left: in chair;with call bell/phone within reach;with family/visitor present     Time: 1101-1117 PT Time Calculation (min): 16 min  Charges:  G Codes:      Immaculate Crutcher, SPTA 04/29/2014, 12:39 PM

## 2014-04-29 NOTE — Progress Notes (Signed)
Dr. Marcello Moores called unit to check on status of another pt. MD made aware that pt in 1524 needed an order for home health PT per request of PT. Request acknowledged. No new orders received as this time.

## 2014-04-29 NOTE — Discharge Instructions (Signed)
ABDOMINAL SURGERY: POST OP INSTRUCTIONS ° °1. DIET: Follow a light bland diet the first 24 hours after arrival home, such as soup, liquids, crackers, etc.  Be sure to include lots of fluids daily.  Avoid fast food or heavy meals as your are more likely to get nauseated.  Do not eat any uncooked fruits or vegetables for the next 2 weeks as your colon heals. °2. Take your usually prescribed home medications unless otherwise directed. °3. PAIN CONTROL: °a. Pain is best controlled by a usual combination of three different methods TOGETHER: °i. Ice/Heat °ii. Over the counter pain medication °iii. Prescription pain medication °b. Most patients will experience some swelling and bruising around the incisions.  Ice packs or heating pads (30-60 minutes up to 6 times a day) will help. Use ice for the first few days to help decrease swelling and bruising, then switch to heat to help relax tight/sore spots and speed recovery.  Some people prefer to use ice alone, heat alone, alternating between ice & heat.  Experiment to what works for you.  Swelling and bruising can take several weeks to resolve.   °c. It is helpful to take an over-the-counter pain medication regularly for the first few weeks.  Choose one of the following that works best for you: °i. Naproxen (Aleve, etc)  Two 220mg tabs twice a day °ii. Ibuprofen (Advil, etc) Three 200mg tabs four times a day (every meal & bedtime) °iii. Acetaminophen (Tylenol, etc) 500-650mg four times a day (every meal & bedtime) °d. A  prescription for pain medication (such as oxycodone, hydrocodone, etc) should be given to you upon discharge.  Take your pain medication as prescribed.  °i. If you are having problems/concerns with the prescription medicine (does not control pain, nausea, vomiting, rash, itching, etc), please call us [phone] to see if we need to switch you to a different pain medicine that will work better for you and/or control your side effect better. °ii. If you  need a refill on your pain medication, please contact your pharmacy.  They will contact our office to request authorization. Prescriptions will not be filled after 5 pm or on week-ends. °4. Avoid getting constipated.  Between the surgery and the pain medications, it is common to experience some constipation.  Increasing fluid intake and taking a fiber supplement (such as Metamucil, Citrucel, FiberCon, MiraLax, etc) 1-2 times a day regularly will usually help prevent this problem from occurring.  A mild laxative (prune juice, Milk of Magnesia, MiraLax, etc) should be taken according to package directions if there are no bowel movements after 48 hours.   °5. Watch out for diarrhea.  If you have many loose bowel movements, simplify your diet to bland foods & liquids for a few days.  Stop any stool softeners and decrease your fiber supplement.  Switching to mild anti-diarrheal medications (Kayopectate, Pepto Bismol) can help.  If this worsens or does not improve, please call us. °6. Wash / shower every day.  You may shower over the incision / wound.  Avoid baths until the skin is fully healed.  Continue to shower over incision(s) after the dressing is off. °7. Remove your waterproof bandages 5 days after surgery.  You may leave the incision open to air.  You may replace a dressing/Band-Aid to cover the incision for comfort if you wish. °8. ACTIVITIES as tolerated:   °a. You may resume regular (light) daily activities beginning the next day--such as daily self-care, walking, climbing stairs--gradually increasing activities as   tolerated.  If you can walk 30 minutes without difficulty, it is safe to try more intense activity such as jogging, treadmill, bicycling, low-impact aerobics, swimming, etc. b. Save the most intensive and strenuous activity for last such as sit-ups, heavy lifting, contact sports, etc  Refrain from any heavy lifting or straining until you are off narcotics for pain control.   c. DO NOT PUSH THROUGH  PAIN.  Let pain be your guide: If it hurts to do something, don't do it.  Pain is your body warning you to avoid that activity for another week until the pain goes down. d. You may drive when you are no longer taking prescription pain medication, you can comfortably wear a seatbelt, and you can safely maneuver your car and apply brakes. e. Dennis Bast may have sexual intercourse when it is comfortable.  9. FOLLOW UP in our office a. Please call [facility] at [phone] to set up an appointment to see your surgeon in the office for a follow-up appointment approximately 1-2 weeks after your surgery. b. Make sure that you call for this appointment the day you arrive home to insure a convenient appointment time. 10. IF YOU HAVE DISABILITY OR FAMILY LEAVE FORMS, BRING THEM TO THE OFFICE FOR PROCESSING.  DO NOT GIVE THEM TO YOUR DOCTOR.   WHEN TO CALL us [phone]: 1. Poor pain control 2. Reactions / problems with new medications (rash/itching, nausea, etc)  3. Fever over 101.5 F (38.5 C) 4. Inability to urinate 5. Nausea and/or vomiting 6. Worsening swelling or bruising 7. Continued bleeding from incision. 8. Increased pain, redness, or drainage from the incision  The clinic staff is available to answer your questions during regular business hours (8:30am-5pm).  Please dont hesitate to call and ask to speak to one of our nurses for clinical concerns.   A surgeon from [facility] is always on call at the hospitals   If you have a medical emergency, go to the nearest emergency room or call 911.    [facility], [place], [place], [place], [place]  [address] ? MAIN: [phone] ? TOLL FREE: [phone] ? FAX [phone] [email or web address]    [place] Catheter Care A [place] catheter is a soft, flexible tube that is placed into the bladder to drain urine. A [place] catheter may be inserted if:  You leak urine or are not able to control when  you urinate (urinary incontinence).  You are not able to urinate when you need to (urinary retention).  You had prostate surgery or surgery on the genitals.  You have certain medical conditions, such as multiple sclerosis, dementia, or a spinal cord injury. If you are going home with a [place] catheter in place, follow the instructions below. TAKING CARE OF THE CATHETER 1. Wash your hands with soap and water. 2. Using mild soap and warm water on a clean washcloth:  Clean the area on your body closest to the catheter insertion site using a circular motion, moving away from the catheter. Never wipe toward the catheter because this could sweep bacteria up into the urethra and cause infection.  Remove all traces of soap. Pat the area dry with a clean towel. For males, reposition the foreskin. 3. Attach the catheter to your leg so there is no tension on the catheter. Use adhesive tape or a leg strap. If you are using adhesive tape, remove any sticky residue left behind by the previous tape you used. 4. Keep the  drainage bag below the level of the bladder, but keep it off the floor. 5. Check throughout the day to be sure the catheter is working and urine is draining freely. Make sure the tubing does not become kinked. 6. Do not pull on the catheter or try to remove it. Pulling could damage internal tissues. TAKING CARE OF THE DRAINAGE BAGS You will be given two drainage bags to take home. One is a large overnight drainage bag, and the other is a smaller leg bag that fits underneath clothing. You may wear the overnight bag at any time, but you should never wear the smaller leg bag at night. Follow the instructions below for how to empty, change, and clean your drainage bags. Emptying the Drainage Bag You must empty your drainage bag when it is  - full or at least 2-3 times a day. 1. Wash your hands with soap and water. 2. Keep the drainage bag below your hips, below the level of your bladder. This  stops urine from going back into the tubing and into your bladder. 3. Hold the dirty bag over the toilet or a clean container. 4. Open the pour spout at the bottom of the bag and empty the urine into the toilet or container. Do not let the pour spout touch the toilet, container, or any other surface. Doing so can place bacteria on the bag, which can cause an infection. 5. Clean the pour spout with a gauze pad or cotton ball that has rubbing alcohol on it. 6. Close the pour spout. 7. Attach the bag to your leg with adhesive tape or a leg strap. 8. Wash your hands well. Changing the Drainage Bag Change your drainage bag once a month or sooner if it starts to smell bad or look dirty. Below are steps to follow when changing the drainage bag. 1. Wash your hands with soap and water. 2. Pinch off the rubber catheter so that urine does not spill out. 3. Disconnect the catheter tube from the drainage tube at the connection valve. Do not let the tubes touch any surface. 4. Clean the end of the catheter tube with an alcohol wipe. Use a different alcohol wipe to clean the end of the drainage tube. 5. Connect the catheter tube to the drainage tube of the clean drainage bag. 6. Attach the new bag to the leg with adhesive tape or a leg strap. Avoid attaching the new bag too tightly. 7. Wash your hands well. Cleaning the Drainage Bag 1. Wash your hands with soap and water. 2. Wash the bag in warm, soapy water. 3. Rinse the bag thoroughly with warm water. 4. Fill the bag with a solution of white vinegar and water (1 cup vinegar to 1 qt warm water [.2 L vinegar to 1 L warm water]). Close the bag and soak it for 30 minutes in the solution. 5. Rinse the bag with warm water. 6. Hang the bag to dry with the pour spout open and hanging downward. 7. Store the clean bag (once it is dry) in a clean plastic bag. 8. Wash your hands well. PREVENTING INFECTION  Wash your hands before and after handling your  catheter.  Take showers daily and wash the area where the catheter enters your body. Do not take baths. Replace wet leg straps with dry ones, if this applies.  Do not use powders, sprays, or lotions on the genital area. Only use creams, lotions, or ointments as directed by your caregiver.  For females,  wipe from front to back after each bowel movement.  Drink enough fluids to keep your urine clear or pale yellow unless you have a fluid restriction.  Do not let the drainage bag or tubing touch or lie on the floor.  Wear cotton underwear to absorb moisture and to keep your skin drier. SEEK MEDICAL CARE IF:   Your urine is cloudy or smells unusually bad.  Your catheter becomes clogged.  You are not draining urine into the bag or your bladder feels full.  Your catheter starts to leak. SEEK IMMEDIATE MEDICAL CARE IF:   You have pain, swelling, redness, or pus where the catheter enters the body.  You have pain in the abdomen, legs, lower back, or bladder.  You have a fever.  You see blood fill the catheter, or your urine is pink or red.  You have nausea, vomiting, or chills.  Your catheter gets pulled out. MAKE SURE YOU:   Understand these instructions.  Will watch your condition.  Will get help right away if you are not doing well or get worse. Document Released: 06/11/2005 Document Revised: 10/26/2013 Document Reviewed: 06/02/2012 [facility] Patient Information 2015 [place], [place]. This information is not intended to replace advice given to you by your health care provider. Make sure you discuss any questions you have with your health care provider.

## 2014-04-29 NOTE — Care Management Note (Signed)
    Page 1 of 2   04/29/2014     7:03:54 PM CARE MANAGEMENT NOTE 04/29/2014  Patient:  Julian Palmer, Julian Palmer   Account Number:  [id number]  Date Initiated:  04/26/2014  Documentation initiated by:  Sunday Spillers  Subjective/Objective Assessment:   53 yo male admitted s/p LAR. PTA lived at home with sister.     Action/Plan:   Home when stable   Anticipated DC Date:  04/29/2014   Anticipated DC Plan:  [place]  CM consult      [facility] Choice  HOME HEALTH   Choice offered to / List presented to:  C-1 Patient        [place] arranged  HH-1 RN  [place] [facility]      [place].   Status of service:  Completed, signed off Medicare Important Message given?   (If response is "NO", the following Medicare IM given date fields will be blank) Date Medicare IM given:   Medicare IM given by:   Date Additional Medicare IM given:   Additional Medicare IM given by:    Discharge Disposition:  [place]  Per UR Regulation:  Reviewed for med. necessity/level of care/duration of stay  If discussed at [place] of Stay Meetings, dates discussed:   04/29/2014    Comments:  04-29-14 [place] with patient and sister at bedside. Discussed d/c plan and readiness for d/c. Patient agrees he is ready for d/c. Plan to work with PT aganin and complete education regarding JP, ostomy, and foley. HH arranged with [facility]. awaiting final orders.

## 2014-04-29 NOTE — Plan of Care (Signed)
Problem: Phase III Progression Outcomes Goal: Activity at appropriate level-compared to baseline (UP IN CHAIR FOR HEMODIALYSIS)  Outcome: Progressing     

## 2014-04-29 NOTE — Progress Notes (Signed)
Patient ID: [place], male   DOB: [birth year], 53 y.o.   MRN: [id number]  POD#7 APR, VRAM, repair urethrotomy  Filed Vitals:   04/29/14 0600  BP: 115/79  Pulse: 72  Temp: 98.5 F (36.9 C)  Resp: 18   Physical exam: Foley in place, urine clear   Intake/Output Summary (Last 24 hours) at 04/29/14 9675 Last data filed at 04/29/14 0600  Gross per 24 hour  Intake    480 ml  Output   3335 ml  Net  -2855 ml    POD#7 APR, VRAM, repair urethrotomy -  -We'll schedule VCUG for a week of November 23 and I will see patient same day to review the images.  My office will schedule. -Continue Foley catheter until VCUG, patient can ambulate with leg bag. -I ordered a daily nitrofurantoin to keep bladder colonization to a minimum -I will sign off.  Please call with questions.

## 2014-04-29 NOTE — Discharge Summary (Signed)
Physician Discharge Summary  Patient ID: Julian Palmer MRN: [id number] DOB/AGE: 09-18-1960 53 y.o.  Admit date: 04/22/2014 Discharge date: 04/29/2014  Admission Diagnoses: anal cancer  Discharge Diagnoses:  Active Problems:   Anal cancer   Discharged Condition: good  Hospital Course: Patient admitted after APR and VRAM flap closure of perineal wound. His diet was advanced slowly.  He began to have ostomy function.  His pain regimen was switched to PO form.  By POD7 the patient was tolerating a regular diet and ambulating and having bowel function.  He will go home with his foley due to his prostate repair.  His JP and staples will be removed next week.  He was admitted with uncontrolled blood glucose levels.  This was able to be mostly controlled by adding Lantus qhs.  We will continue this as an outpatient.    Consults: urology  Significant Diagnostic Studies: labs: cbc, chemistry  Treatments: IV hydration, analgesia: acetaminophen w/ codeine, insulin: regular and Lantus and surgery: see above  Discharge Exam: Blood pressure 131/67, pulse 78, temperature 98.3 F (36.8 C), temperature source Oral, resp. rate 18, height 5\' 11"  (1.803 m), weight 212 lb (96.163 kg), SpO2 100 %. General appearance: alert and cooperative GI: soft, appropriately tender Incision/Wound: ostomy viable, JP SS, midline wound clean, dry, intact  Disposition: 01-Home or Self Care     Medication List    TAKE these medications        acetaminophen 500 MG tablet  Commonly known as:  TYLENOL  Take 1,000 mg by mouth every 6 (six) hours as needed for mild pain or moderate pain.     ASPIR-81 81 MG EC tablet  Generic drug:  aspirin  Take 81 mg by mouth daily.     aspirin-acetaminophen-caffeine 786-754-49 MG per tablet  Commonly known as:  EXCEDRIN MIGRAINE  Take 2 tablets by mouth every 6 (six) hours as needed for headache or migraine.     atorvastatin 80 MG tablet  Commonly known as:  LIPITOR  Take  80 mg by mouth every evening.     citalopram 40 MG tablet  Commonly known as:  CELEXA  Take 40 mg by mouth every morning.     efavirenz-emtricitabine-tenofovir 600-200-300 MG per tablet  Commonly known as:  ATRIPLA  Take 1 tablet by mouth at bedtime.     glimepiride 2 MG tablet  Commonly known as:  AMARYL  Take 4 mg by mouth 2 (two) times daily.     HYDROcodone-acetaminophen 5-325 MG per tablet  Commonly known as:  NORCO/VICODIN  Take 1-2 tablets by mouth every 4 (four) hours as needed.     insulin glargine 100 UNIT/ML injection  Commonly known as:  LANTUS  Inject 0.25 mLs (25 Units total) into the skin at bedtime.     losartan 100 MG tablet  Commonly known as:  COZAAR  Take 100 mg by mouth every evening.     metFORMIN 1000 MG tablet  Commonly known as:  GLUCOPHAGE  Take 1,000 mg by mouth 2 (two) times daily.     MULTIVITAL tablet  Take 1 tablet by mouth every evening.     niacin 1000 MG CR tablet  Commonly known as:  NIASPAN  Take 1,000 mg by mouth at bedtime.     oxyCODONE 5 MG immediate release tablet  Commonly known as:  Oxy IR/ROXICODONE  Take 1-3 tablets (5-15 mg total) by mouth every 4 (four) hours as needed for moderate pain, severe pain or breakthrough pain.  traZODone 100 MG tablet  Commonly known as:  DESYREL  Take 200 mg by mouth at bedtime.           Follow-up Information    Follow up with Festus Aloe, MD.   Specialty:  Urology   Why:  Office will call with appointment   Contact information:   [place] [address] [phone]       Follow up with Irene Limbo, MD On 05/10/2014.   Specialty:  Plastic Surgery   Why:  9: 00 am   Contact information:   [address] [place] [address] [phone]       Follow up with Rosario Adie., MD. Schedule an appointment as soon as possible for a visit in 2 weeks.   Specialty:  General Surgery   Contact information:   [address] [place] [place]  [address] [phone]       Signed: Rosario Adie 49/12/261, 7:85 PM

## 2014-04-29 NOTE — Progress Notes (Signed)
7 Days Post-Op APR and VRAM flap Subjective: Doing ok. Did better wuith ambulation yesterday. Tolerating soft diet.  Blood glucose levels better.    Objective: Vital signs in last 24 hours: Temp:  [98 F (36.7 C)-98.7 F (37.1 C)] 98.5 F (36.9 C) (11/05 0600) Pulse Rate:  [72-75] 72 (11/05 0600) Resp:  [18] 18 (11/05 0600) BP: (101-123)/(56-79) 115/79 mmHg (11/05 0600) SpO2:  [96 %-99 %] 99 % (11/05 0600)   Intake/Output from previous day: 11/04 0701 - 11/05 0700 In: 480 [P.O.:480] Out: 3335 [Urine:2400; Drains:35; Stool:900] Intake/Output this shift:   General appearance: alert and cooperative GI:  appropriately tender JP: SS Ostomy pink and viable Incision:  no significant erythema or drainage  Lab Results:  No results for input(s): WBC, HGB, HCT, PLT in the last 72 hours. BMET No results for input(s): NA, K, CL, CO2, GLUCOSE, BUN, CREATININE, CALCIUM in the last 72 hours. PT/INR No results for input(s): LABPROT, INR in the last 72 hours. ABG No results for input(s): PHART, HCO3 in the last 72 hours.  Invalid input(s): PCO2, PO2  MEDS, Scheduled . atorvastatin  80 mg Oral QPM  . citalopram  40 mg Oral q morning - 10a  . clotrimazole   Topical BID  . efavirenz-emtricitabine-tenofovir  1 tablet Oral QHS  . enoxaparin (LOVENOX) injection  40 mg Subcutaneous Q24H  . glimepiride  4 mg Oral BID AC  . insulin aspart  0-20 Units Subcutaneous TID WC  . insulin aspart  0-5 Units Subcutaneous QHS  . insulin glargine  25 Units Subcutaneous QHS  . losartan  100 mg Oral QPM  . metFORMIN  1,000 mg Oral BID WC  . traZODone  200 mg Oral QHS    Studies/Results: No results found.  Assessment: s/p Procedure(s): LAPAROSCOPIC  ABDOMINAL PERINEAL RESECTION, CLOSURE OF URETEROTOMY AND BLADDER IRRIGATION VRAM FLAP Patient Active Problem List   Diagnosis Date Noted  . 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 Cancer of anal canal 01/21/2014  . Erectile dysfunction 04/17/2013  . Diabetes  type 2, uncontrolled 04/17/2013  . 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 Hyperlipidemia 09/13/2009  . HYPERTENSION, BENIGN 09/13/2009  . CAD, NATIVE VESSEL 09/13/2009    Plan: Cont soft diet, carb controlled OOB today, Ambulate TID Cont SSI Q4h with lantus (increase to 25u qhs), cont Amaryl and metformin Cont po narcotics May need snf if doesn't start ambulating better.    LOS: 7 days     .Rosario Adie, MD [facility] Surgery, [place]   04/29/2014 8:38 AM

## 2014-04-29 NOTE — Progress Notes (Signed)
OT Cancellation Note  Patient Details Name: Julian Palmer MRN: [id number] DOB: January 04, 1961   Cancelled Treatment:    Reason Eval/Treat Not Completed: Other (comment) Pt currently working with PT. Will try back later today as schedule allows.  [name]  [phone] 04/29/2014, 11:13 AM

## 2014-04-29 NOTE — Progress Notes (Signed)
Pt discharged via wc to front entrance to meet awaiting vehicle to carry home. Accompanied by NT and sister.

## 2014-04-29 NOTE — Progress Notes (Signed)
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  PMHx:  HIV+ (family is unaware of this dx), MI with stent   OT comments  Pt doing well. Practiced up to the sink with walker to perform grooming task. Pt does fatigue in standing with increased time performing task. Educated on energy conservation techniques and issued handout and reviewed information with pt and family. Pt making good progress.    Follow Up Recommendations  No OT follow up;Supervision/Assistance - 24 hour    Equipment Recommendations  None recommended by OT    Recommendations for Other Services      Precautions / Restrictions Precautions Precautions: Fall Precaution Comments: 1 JP drains, colostomy, multiple other lines at time of eval; monitor BP Restrictions Weight Bearing Restrictions: No       Mobility Bed Mobility Overal bed mobility: Needs Assistance Bed Mobility: Supine to Sit Rolling: Supervision Sidelying to sit: HOB elevated;Supervision Supine to sit: HOB elevated Sit to supine: Supervision      Transfers Overall transfer level: Needs assistance Equipment used: Rolling walker (2 wheeled) Transfers: Sit to/from Stand Sit to Stand: Supervision         General transfer comment: verbal cues to reach back to bed before sitting and back up fully to surface.    Balance                                   ADL       Grooming: Min guard;Standing;Wash/dry face Grooming Details (indicate cue type and reason): only able to stand and wash face for a minute before reporting soome fatigue in standing.                                General ADL Comments:  Pt reports he is supposed to d/c today. Family member present. issued energy conservation handout and reviewed information including to have a chair ready in case he needs to sit while grooming, pacing self, luke warm water use, breaking up tasks into small steps, etc. Pt is able to cross LEs up today to don socks with good tolerance. Min cues to fully back up to bed before sitting and hand placement. Educated on coming right up to sink surface with walker to avoid over bending to brush teeth, etc.       Vision                     Perception     Praxis      Cognition   Behavior During Therapy: [facility] for tasks assessed/performed Overall Cognitive Status: Within Functional Limits for tasks assessed                       Extremity/Trunk Assessment               Exercises     Shoulder Instructions       General Comments      Pertinent Vitals/ Pain       Pain Assessment: 0-10 Pain Score: 6  Pain Location: abdomen  Pain Descriptors / Indicators: Sore Pain Intervention(s): Repositioned  Home Living                                          Prior Functioning/Environment              Frequency Min 2X/week     Progress Toward Goals  OT Goals(current goals can now be found in the care plan section)  Progress towards OT goals: Progressing toward goals     Plan Discharge plan needs to be updated    Co-evaluation                 End of Session Equipment Utilized During Treatment: Rolling walker   Activity Tolerance Patient tolerated treatment well   Patient Left in bed;with call bell/phone within reach;with family/visitor present   Nurse Communication          Time: 5456-2563 OT Time Calculation (min): 15 min  Charges: OT General Charges $OT Visit: 1 Procedure OT Treatments $Therapeutic Activity: 8-22 mins  [name]  [phone] 04/29/2014, 12:45 PM

## 2014-04-29 NOTE — Progress Notes (Signed)
POD#7 APR, VRAM, repair urethrotomy  Temp:  [98 F (36.7 C)-98.7 F (37.1 C)] 98.5 F (36.9 C) (11/05 0600) Pulse Rate:  [72-75] 72 (11/05 0600) Resp:  [18] 18 (11/05 0600) BP: (101-123)/(56-79) 115/79 mmHg (11/05 0600) SpO2:  [96 %-99 %] 99 % (11/05 0600)  JP 35 PO 480 BS 144/220/108/157  tol soft diet  PE: Abdomen soft, Staple line intact dry  perineum flap soft, erythema flap and buttocks proximal thighs consistent with yeast rash  A/P: BS improved, plan early f/u post d/c with PCP regarding this and possible insulin Ok to apply desitin and clotrimazole  Irene Limbo, MD [facility] Plastic & Reconstructive Surgery [phone]

## 2014-04-29 NOTE — Plan of Care (Signed)
Problem: Phase II Progression Outcomes Goal: Pain controlled Outcome: Completed/Met Date Met:  04/29/14  Problem: Phase III Progression Outcomes Goal: Pain controlled on oral analgesia Outcome: Completed/Met Date Met:  04/29/14 Goal: Demonstrates TCDB, IS independently Outcome: Completed/Met Date Met:  04/29/14

## 2014-04-29 NOTE — Progress Notes (Addendum)
Assessment unchanged. Pt and sister verbalized understanding of dc instructions through teach back. Pt has My Chart account. Understands when to follow up with doctors including surgeon, urologist as well as oncology. [facility] to follow pt after dc for PT and RN needs. Colostomy pouch changed prior to dc. Leg bag applied to foley catheter with instructions how to change over to larger bag at night. Large foley bag and extra leg bag provided. Pt demonstrated how to recharge and empty JP drain as well as record findings.

## 2014-04-29 NOTE — Progress Notes (Signed)
Dr. Marcello Moores aware via phone pt and sister wants to dc home today. Md unable to get back to hospital this evening. No scripts available yet pt agreeable to dc home without scripts. Sister plans go by office in am to pick up prepared scripts for analgesic.

## 2014-04-30 ENCOUNTER — Other Ambulatory Visit (HOSPITAL_COMMUNITY): Payer: Self-pay | Admitting: Urology

## 2014-04-30 DIAGNOSIS — C20 Malignant neoplasm of rectum: Secondary | ICD-10-CM

## 2014-05-13 ENCOUNTER — Telehealth: Payer: Self-pay | Admitting: Hematology

## 2014-05-13 NOTE — Telephone Encounter (Signed)
LEFT MESSAGE FOR PATIENT TO RETURN CALL TO SCHEDULE NP APPT.  °

## 2014-05-17 ENCOUNTER — Ambulatory Visit (HOSPITAL_COMMUNITY)
Admission: RE | Admit: 2014-05-17 | Discharge: 2014-05-17 | Disposition: A | Discharge status: Home / Self Care | Payer: Private Health Insurance - Indemnity | Source: Ambulatory Visit | Attending: Urology | Admitting: Urology

## 2014-05-17 DIAGNOSIS — C20 Malignant neoplasm of rectum: Secondary | ICD-10-CM | POA: Diagnosis not present

## 2014-05-17 MED ORDER — DIATRIZOATE MEGLUMINE 30 % UR SOLN
Freq: Once | URETHRAL | Status: AC | PRN
  Administered 2014-05-17: 200 mL

## 2014-05-18 ENCOUNTER — Telehealth: Payer: Self-pay | Admitting: Hematology

## 2014-05-18 NOTE — Telephone Encounter (Signed)
S/W PATIENT AND GAVE NP APPT FOR 12/01 @ 1:30 W/DR. [place] DX- ANAL CA C/D ON 11/24 FOR NP APPT ON 12/1

## 2014-05-24 NOTE — Progress Notes (Signed)
[facility] NOTE  Patient Care Team: [name], MD as PCP - General (Infectious Diseases) Robert Bellow, MD (General Surgery)  CHIEF COMPLAINTS/PURPOSE OF CONSULTATION:  Recurrent anal cancer  OTHER RELATED ISSUES: 1. (+) HIV, on therapy with good control  2. CAD, s/p stent placement in 1998   HISTORY OF PRESENTING ILLNESS:  [place] 53 y.o. male is here because of recurrent anal cancer.  He was diagnosed with an of squamous cell carcinoma at [facility] in December 2014. The pathology report from 05/28/2008 only reported squamous cell carcinoma in situ, I do not have other pathology report from that time. The patient, he had endoscopic surgical resection, followed by definitive dose radiation and concurrent chemotherapy, with 5-FU continuous infusion for 5 days, and the possible another chemotherapy drug which he does not remember. He was treated at [place].he did followed with his medical oncologist Dr. Grayland Ormond regularly.  He noticed an no mass in June 2015. He had excisional biopsy which showed invasive squamous cell carcinoma. The surgical margin was positive. He was referred to colorectal surgeon Dr. Marcello Moores in [place] in August 2015. His restaging PET scan was negative for distant metastasis or pelvic adenopathy. He underwent abdominal perianal resection (APR) and VRAM flap closure of perineal wound on 04/21/14. Marland Kitchen His diet was advanced slowly. He began to have ostomy function. He was discharged after 7 days of hospital stay.he was also seen by urologist periop and had a Foley placed. The Foley was removed a few weeks ago, the urinary leakage was found at his risk constructed. Site. Foley was reinserted, and he has appointment to follow-up with his urologist in 3 weeks.  He still has  perianal pain, off pain meds now, no fever or chills. He has good appetite and eats well. He failed foley trial  He lost 15lbs since June 2015  which he contributes to depression. His weight been stable since the surery.   MEDICAL HISTORY:  Past Medical History  Diagnosis Date  . S/p bare metal coronary artery stent     1998  pLAD  . Type 2 diabetes mellitus   . Hypertension   . 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 Hyperlipidemia   . History of carcinoma in situ of anal canal     2010  . ED (erectile dysfunction)   . Sigmoid diverticulosis   . PONV (postoperative nausea and vomiting)   . Wears glasses   . At risk for sleep apnea     STOP-BANG= 4     SENT TO PCP 03-23-2014  . Coronary artery disease CARDIOLOGIST--  DR Rockey Situ --  LAST LOV 04-17-2013    S/P  PCI to mid and distal LAD and BM stent x1 to pLAD  . Anxiety   . Depression   . Recurrent squamous cell carcinoma of anus     removal mass 01-08-2014  . 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 HIV positive     SURGICAL HISTORY: Past Surgical History  Procedure Laterality Date  . Septoplasty  1985  . Sphincterotomy  2009  . Cardiovascular stress test  04/ 2010    low risk study/  very small region of mildly reduced perfersion is mildly reversible dLAD territory  . Resection anal mass  02-05-2011  . Excision anal  polyp  2009  . Re-excision anal squamous cell carcinoma  2010  . Removal rectal mass  01-08-2014  . Eua w/ bx perianal skin  06-08-2009  . Port-a-cath placement  02-21-2011    REMOVED  2013  . Coronary angioplasty with stent placement  1998   [facility]    BM stent to pLAD and PCI to Mid and Distal LAD/  no other significant cad  . Inguinal hernia repair Bilateral 1988  . Colonoscopy with propofol  05-28-2008  . Mass excision N/A 03/26/2014    Procedure: ANAL EXAM UNDER ANESTHESIA,EXCISIONAL BIOPSY OF ANAL MASS;  Surgeon: Leighton Ruff, MD;  Location: [facility];  Service: General;   Laterality: N/A;  . Laparoscopic assisted abdominal perineal resection N/A 04/22/2014    Procedure: LAPAROSCOPIC  ABDOMINAL PERINEAL RESECTION, ;  Surgeon: Leighton Ruff, MD;  Location: [facility];  Service: General;  Laterality: N/A;  . Urethrotomy Bilateral 04/22/2014    Procedure: CYSTOSCOPY/URETHROTOMY;  Surgeon: Leighton Ruff, MD;  Location: [facility];  Service: General;  Laterality: Bilateral;  . Muscle flap closure N/A 04/22/2014    Procedure: VRAM FLAP;  Surgeon: Irene Limbo, MD;  Location: [facility];  Service: Plastics;  Laterality: N/A;    SOCIAL HISTORY: History   Social History  . Marital Status: Single    Spouse Name: N/A    Number of Children: N/A  . Years of Education: N/A   Occupational History  .  [place] History Main Topics  . Smoking status: Never Smoker   . Smokeless tobacco: Never Used  . Alcohol Use: Yes     Comment: RARE  . Drug Use: No  . Sexual Activity: Not on file   Other Topics Concern  . Not on file   Social History Narrative   Full time. Single. Does not regularly exercise.     FAMILY HISTORY: Family History  Problem Relation Age of Onset  . Diabetes type II Mother   . Breast cancer Mother 28     survivor  . Hypertension Mother   . Coronary artery disease Father   Paternal GM had utrine cancer, no other history of malignancy   ALLERGIES:  is allergic to sulfa antibiotics.  MEDICATIONS:  Current Outpatient Prescriptions  Medication Sig Dispense Refill  . aspirin (ASPIR-81) 81 MG EC tablet Take 81 mg by mouth daily.     Marland Kitchen atorvastatin (LIPITOR) 80 MG tablet Take 80 mg by mouth every evening.    . Blood Glucose Monitoring Suppl (FIFTY50 GLUCOSE METER 2.0) W/DEVICE KIT Use 1 each as directed.    . citalopram (CELEXA) 40 MG tablet Take 40 mg by mouth every morning.    Marland Kitchen efavirenz-emtrictabine-tenofovir (ATRIPLA) 600-200-300 MG per tablet Take 1 tablet by mouth at bedtime.     Marland Kitchen glimepiride (AMARYL) 2 MG tablet Take 4 mg  by mouth 2 (two) times daily.     Marland Kitchen glucose blood ([employer] BLOOD GLUCOSE TEST STRIPS) test strip Use 3 (three) times daily. Use as instructed.    . insulin glargine (LANTUS) 100 UNIT/ML injection Inject 0.25 mLs (25 Units total) into the skin at bedtime. 10 mL 2  . Lancet Devices ([employer]S LANCING DEVICE) MISC Use 1 each once daily. Use as instructed.    Marland Kitchen losartan (COZAAR) 100 MG tablet Take 100 mg by mouth every evening.    . metFORMIN (GLUCOPHAGE) 1000 MG tablet Take 1,000 mg by mouth 2 (two) times daily.      . Multiple Vitamins-Minerals (MULTIVITAL)  tablet Take 1 tablet by mouth every evening.     . niacin (NIASPAN) 1000 MG CR tablet Take 1,000 mg by mouth at bedtime.    . nitrofurantoin (MACRODANTIN) 50 MG capsule     . traZODone (DESYREL) 100 MG tablet Take 200 mg by mouth at bedtime.    Marland Kitchen acetaminophen (TYLENOL) 500 MG tablet Take 1,000 mg by mouth every 6 (six) hours as needed for mild pain or moderate pain.     Marland Kitchen aspirin-acetaminophen-caffeine (EXCEDRIN MIGRAINE) 250-250-65 MG per tablet Take 2 tablets by mouth every 6 (six) hours as needed for headache or migraine.    Marland Kitchen oxyCODONE (OXY IR/ROXICODONE) 5 MG immediate release tablet Take 1-3 tablets (5-15 mg total) by mouth every 4 (four) hours as needed for moderate pain, severe pain or breakthrough pain. (Patient not taking: Reported on 05/25/2014) 30 tablet 0   No current facility-administered medications for this visit.    REVIEW OF SYSTEMS:   Constitutional: Denies fevers, chills or abnormal night sweats Eyes: Denies blurriness of vision, double vision or watery eyes Ears, nose, mouth, throat, and face: Denies mucositis or sore throat Respiratory: Denies cough, dyspnea or wheezes Cardiovascular: Denies palpitation, chest discomfort or lower extremity swelling Gastrointestinal:  Denies nausea, heartburn or change in bowel habits Skin: Denies abnormal skin rashes Lymphatics: Denies new lymphadenopathy or easy bruising Neurological:Denies  numbness, tingling or new weaknesses Behavioral/Psych: Mood is stable, no new changes  All other systems were reviewed with the patient and are negative.  PHYSICAL EXAMINATION: ECOG PERFORMANCE STATUS: 1 - Symptomatic but completely ambulatory  Filed Vitals:   05/25/14 1311  BP: 136/85  Pulse: 97  Temp: 98.2 F (36.8 C)  Resp: 18   Filed Weights   05/25/14 1311  Weight: 196 lb (88.905 kg)    GENERAL:alert, no distress and comfortable SKIN: skin color, texture, turgor are normal, no rashes or significant lesions EYES: normal, conjunctiva are pink and non-injected, sclera clear OROPHARYNX:no exudate, no erythema and lips, buccal mucosa, and tongue normal  NECK: supple, thyroid normal size, non-tender, without nodularity LYMPH:  no palpable lymphadenopathy in the cervical, axillary or inguinal LUNGS: clear to auscultation and percussion with normal breathing effort HEART: regular rate & rhythm and no murmurs and no lower extremity edema ABDOMEN:abdomen soft, non-tender and normal bowel sounds Musculoskeletal:no cyanosis of digits and no clubbing  PSYCH: alert & oriented x 3 with fluent speech NEURO: no focal motor/sensory deficits  LABORATORY DATA:  I have reviewed the data as listed Lab Results  Component Value Date   WBC 8.3 05/25/2014   HGB 10.9* 05/25/2014   HCT 34.4* 05/25/2014   MCV 92.5 05/25/2014   PLT 376 05/25/2014    Recent Labs  04/23/14 0545 04/24/14 0420 04/25/14 0505 05/25/14 1508  NA 136* 133* 138 135*  K 4.5 3.8 3.9 4.1  CL 100 98 102  --   CO2 $Re'22 26 25 24  'qYL$ GLUCOSE 299* 239* 250* 103  BUN $Re'13 14 12 'CZP$ 13.9  CREATININE 0.89 0.86 0.74 0.9  CALCIUM 8.0* 8.0* 7.9* 9.3  GFRNONAA >90 >90 >90  --   GFRAA >90 >90 >90  --   PROT  --   --   --  8.6*  ALBUMIN  --   --   --  3.3*  AST  --   --   --  29  ALT  --   --   --  57*  ALKPHOS  --   --   --  128  BILITOT  --   --   --  0.20   PATHOLOGY REPORT 04/22/2014 Diagnosis 1. Soft tissue, biopsy, right  pelvic sidewall; r/o malignancy - FIBROADIPOSE TISSUE, NO EVIDENCE OF MALIGNANCY. 2. Colon, segmental resection for tumor, rectum,sigmoid and anus - RECURRENT INVASIVE POORLY DIFFERENTIATED SQUAMOUS CELL CARCINOMA, INVADING THROUGH THE MUSCULARIS PROPRIA, INTO PERICOLONIC FATTY TISSUE, EXTENDING TO THE DEEP INKED MARGIN. - PERINEURAL INVASION AND ANGIOLYMPHATIC PRESENT. - SEVEN OF SEVENTEEN LYMPH NODES, POSITIVE FOR METASTATIC CARCINOMA (7/17) Microscopic Comment 2. Anus Specimen: Anus, rectum, sigmoid Procedure: Segmental resection Tumor site: Distal ends of specimen Specimen integrity: Intact Invasive tumor: Maximum size: At least 2.5 cm Histologic type(s): Invasive squamous cell carcinoma Histologic grade and differentiation: G2-3, moderately to poorly differentiated Microscopic extension of invasive tumor: Invading through the muscularis propria into pericolonic fatty tissue involving adjacent skeletal muscle tissue Lymph-Vascular invasion: Present Peri-neural invasion: Present Resection margins: Deep margin is positive for tumor Treatment effect (neo-adjuvant therapy): Present, minimally Lymph nodes: number examined 17; number positive: 7 Pathologic Staging: ypT2, yN1 (R1) Ancillary studies: N/A  RADIOGRAPHIC STUDIES: I have personally reviewed the radiological images as listed and agreed with the findings in the report. Dg Cystogram Voiding 05/17/2014    IMPRESSION:  1. Mild degradation secondary to patient's inability to void fully.  2. Given this factor, normal appearance of the urethra.     PET[facility] 01/19/2014 at [facility]  Negative for hypermetabolic lesion or metastatic disease   ASSESSMENT & PLAN:  53 years old male with past medical history of anal squamous cell carcinoma, status post surgical resection and concurrent chemoradiation in 2009, now has local recurrence status post APR in the reconstruction.ypT2, yN1 (R1), unfortunately his deep surgical  margin was positive, and 7 out of 17 lymph nodes were positive also. No distant metastasis on the preop PET scan. His medical history is also complicated by diabetes, coronary artery disease status post stent placement, and HIV on treatment. Performance status 1.  I reviewed his surgical findings with him and his sister. He had definitive dose radiation for an cancer 6 years ago, he is unlikely to be a candidate for full dose of re-irradiation for his residual tumor after surgery. Regardless, I'll still refer him to our radiation oncology today for evaluation to see if he is a candidate for low dose radiation.  We discussed the role of chemotherapy to control the residual disease after surgery. Chemotherapy alone is unlikely going to be curative also. If there is no other surgical or radiation options, I would offer him systemic chemotherapy, likely cisplatin and 5-FU regimen for total of 4-6 cycles. 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 His comorbidities especially heart disease and HIV, were certainly make his risk of combinations from chemotherapy high, but consider dose are under control and he is relatively young and with good performance status, I think he is still a good candidate for chemotherapy.  Plan #1 radiation oncology referral #2 giving his wound issue and urinary leakage, I will give him more time to recover and consider starting chemotherapy in a month's #3 medport placement for chemotherapy, I'll contact Dr. Maisie Fus. #4 CT of abdomen and pelvis with IV contrast before initiating chemotherapy. #5 lab today with CBC CMP and CD4/CD8 counts  #6 follow-up with his cardiologist for stress test to ruled out ischemia, before initiating of 5-FU infusion #7 continue  follow-up with his primary care physician for diabetes control and infection  disease specialist for HIV treatment. #8 I'll try to get his previous cancer treatment records from [facility].   I plan to see him back in 4 weeks with lab and a CT scan.   Of note, his family members do not know his HIV status. I ensured him this will be kept confidential.  Orders Placed This Encounter  Procedures  . CBC with Differential    Standing Status: Future     Number of Occurrences: 1     Standing Expiration Date: 05/25/2015  . Comprehensive metabolic panel (Cmet) - [facility]    Standing Status: Future     Number of Occurrences: 1     Standing Expiration Date: 05/25/2015    Order Specific Question:  Has the patient fasted?    Answer:  No  . Cd4/cd8 (t-helper/t-suppressor cell)  . CD4 count    Standing Status: Future     Number of Occurrences: 1     Standing Expiration Date: 05/25/2015  . CD8 Count    Standing Status: Future     Number of Occurrences: 1     Standing Expiration Date: 05/25/2015     No problem-specific assessment & plan notes found for this encounter.    All questions were answered. The patient knows to call the clinic with any problems, questions or concerns. I spent 40 minutes counseling the patient face to face. The total time spent in the appointment was 60 minutes and more than 50% was on counseling.     Truitt Merle, MD 05/25/2014 11:12 PM

## 2014-05-25 ENCOUNTER — Telehealth: Payer: Self-pay | Admitting: Hematology

## 2014-05-25 ENCOUNTER — Ambulatory Visit (HOSPITAL_BASED_OUTPATIENT_CLINIC_OR_DEPARTMENT_OTHER): Payer: Private Health Insurance - Indemnity

## 2014-05-25 ENCOUNTER — Encounter: Payer: Self-pay | Admitting: Hematology

## 2014-05-25 ENCOUNTER — Other Ambulatory Visit: Payer: Self-pay | Admitting: Hematology

## 2014-05-25 ENCOUNTER — Ambulatory Visit (HOSPITAL_BASED_OUTPATIENT_CLINIC_OR_DEPARTMENT_OTHER): Payer: Private Health Insurance - Indemnity | Admitting: Hematology

## 2014-05-25 ENCOUNTER — Ambulatory Visit: Payer: Private Health Insurance - Indemnity

## 2014-05-25 VITALS — BP 136/85 | HR 97 | Temp 98.2°F | Resp 18 | Ht 71.0 in | Wt 196.0 lb

## 2014-05-25 DIAGNOSIS — C211 Malignant neoplasm of anal canal: Secondary | ICD-10-CM

## 2014-05-25 DIAGNOSIS — B20 Human immunodeficiency virus [HIV] disease: Secondary | ICD-10-CM

## 2014-05-25 LAB — CBC WITH DIFFERENTIAL/PLATELET
BASO%: 1.1 % (ref 0.0–2.0)
Basophils Absolute: 0.1 10*3/uL (ref 0.0–0.1)
EOS%: 3.5 % (ref 0.0–7.0)
Eosinophils Absolute: 0.3 10*3/uL (ref 0.0–0.5)
HCT: 34.4 % — ABNORMAL LOW (ref 38.4–49.9)
HGB: 10.9 g/dL — ABNORMAL LOW (ref 13.0–17.1)
LYMPH#: 1.6 10*3/uL (ref 0.9–3.3)
LYMPH%: 19.6 % (ref 14.0–49.0)
MCH: 29.4 pg (ref 27.2–33.4)
MCHC: 31.8 g/dL — AB (ref 32.0–36.0)
MCV: 92.5 fL (ref 79.3–98.0)
MONO#: 0.8 10*3/uL (ref 0.1–0.9)
MONO%: 9.5 % (ref 0.0–14.0)
NEUT#: 5.5 10*3/uL (ref 1.5–6.5)
NEUT%: 66.3 % (ref 39.0–75.0)
Platelets: 376 10*3/uL (ref 140–400)
RBC: 3.72 10*6/uL — AB (ref 4.20–5.82)
RDW: 14.6 % (ref 11.0–14.6)
WBC: 8.3 10*3/uL (ref 4.0–10.3)

## 2014-05-25 LAB — COMPREHENSIVE METABOLIC PANEL (CC13)
ALBUMIN: 3.3 g/dL — AB (ref 3.5–5.0)
ALT: 57 U/L — ABNORMAL HIGH (ref 0–55)
ANION GAP: 9 meq/L (ref 3–11)
AST: 29 U/L (ref 5–34)
Alkaline Phosphatase: 128 U/L (ref 40–150)
BUN: 13.9 mg/dL (ref 7.0–26.0)
CALCIUM: 9.3 mg/dL (ref 8.4–10.4)
CHLORIDE: 102 meq/L (ref 98–109)
CO2: 24 meq/L (ref 22–29)
CREATININE: 0.9 mg/dL (ref 0.7–1.3)
GLUCOSE: 103 mg/dL (ref 70–140)
POTASSIUM: 4.1 meq/L (ref 3.5–5.1)
Sodium: 135 mEq/L — ABNORMAL LOW (ref 136–145)
Total Bilirubin: 0.2 mg/dL (ref 0.20–1.20)
Total Protein: 8.6 g/dL — ABNORMAL HIGH (ref 6.4–8.3)

## 2014-05-25 NOTE — Progress Notes (Signed)
Checked in new patient with no issues prior to seeing the dr. He has appt card and has not been traveling. °

## 2014-05-25 NOTE — Telephone Encounter (Signed)
gv and printed appt sched and avs for pt for Dec..Marland KitchenMarland KitchenMarland Kitchenper Meredeth Ide MD will put in referral

## 2014-05-26 LAB — CD4/CD8 (T-HELPER/T-SUPPRESSOR CELL)
CD4 ABSOLUTE: 710 /uL (ref 500–1900)
CD4%: 37 % (ref 30.0–60.0)
CD8 T CELL ABS: 740 /uL (ref 230–1000)
CD8TOX: 39 % (ref 15.0–40.0)
RATIO: 0.95 — AB (ref 1.0–3.0)
Total lymphocyte count: 1930 /uL (ref 1000–4000)

## 2014-05-27 ENCOUNTER — Telehealth: Payer: Self-pay | Admitting: *Deleted

## 2014-05-27 NOTE — Telephone Encounter (Signed)
[facility] requested medical records especially related to chemotherapy with drug regimen & dates given per Dr Ernestina Penna request.

## 2014-05-28 ENCOUNTER — Telehealth: Payer: Self-pay | Admitting: *Deleted

## 2014-05-28 NOTE — Telephone Encounter (Signed)
Tried to reach pt's cardiologist, Dr. Esmond Plants @ [phone] & [phone] & unable to get through.  Phone system at that office not working properly & unable to leave message.  Notified pt & he will cont to try to reach MD to set up stress test to r/o ischemia.

## 2014-05-31 ENCOUNTER — Telehealth: Payer: Self-pay | Admitting: *Deleted

## 2014-05-31 ENCOUNTER — Other Ambulatory Visit: Payer: Self-pay | Admitting: *Deleted

## 2014-05-31 ENCOUNTER — Telehealth: Payer: Self-pay

## 2014-05-31 DIAGNOSIS — Z01818 Encounter for other preprocedural examination: Secondary | ICD-10-CM

## 2014-05-31 DIAGNOSIS — C211 Malignant neoplasm of anal canal: Secondary | ICD-10-CM

## 2014-05-31 DIAGNOSIS — I2510 Atherosclerotic heart disease of native coronary artery without angina pectoris: Secondary | ICD-10-CM

## 2014-05-31 LAB — CD8 COUNT

## 2014-05-31 LAB — T-HELPER CELLS (CD4) COUNT (NOT AT ARMC)

## 2014-05-31 NOTE — Progress Notes (Addendum)
GI Location of Tumor / Histology: Recurrent anal cancer  Julian Palmer presented with an anal mass in July, 2015.  Biopsies revealed:   12/24/13 Diagnosis Consult Slide , Rectal mass - DETACHED FRAGMENTS OF AT LEAST HIGH GRADE SQUAMOUS DYSPLASIA/CARCINOMA IN SITU, SEE COMMENT.  01/08/14 Diagnosis 1. Consult Slide , Rectal mass INVASIVE SQUAMOUS CELL CARCINOMA, SEE COMMENT. 2. Consult Slide , Rectal mass INVASIVE SQUAMOUS CELL CARCINOMA, SEE COMMENT.  04/22/14 Diagnosis 1. Soft tissue, biopsy, right pelvic sidewall; r/o malignancy - FIBROADIPOSE TISSUE, NO EVIDENCE OF MALIGNANCY. 2. Colon, segmental resection for tumor, rectum,sigmoid and anus - RECURRENT INVASIVE POORLY DIFFERENTIATED SQUAMOUS CELL CARCINOMA, INVADING THROUGH THE MUSCULARIS PROPRIA, INTO PERICOLONIC FATTY TISSUE, EXTENDING TO THE DEEP INKED MARGIN. - PERINEURAL INVASION AND ANGIOLYMPHATIC PRESENT. - SEVEN OF SEVENTEEN LYMPH NODES, POSITIVE FOR METASTATIC CARCINOMA (7/17)  Past/Anticipated interventions by surgeon, if any: 03/26/14 - Procedure: ANAL EXAM UNDER ANESTHESIA,EXCISIONAL BIOPSY OF ANAL MASS;  Surgeon: Leighton Ruff, MD;  Location: [facility];  Service: General;  Laterality: N/A; 04/22/14 - Procedure: LAPAROSCOPIC  ABDOMINAL PERINEAL RESECTION, ;  Surgeon: Leighton Ruff, MD;  [facility];  Service: General;  Laterality: N/A; Procedure: CYSTOSCOPY/URETHROTOMY;  Surgeon: Leighton Ruff, MD;  [facility];  Service: General;  Laterality: Bilateral; Procedure: VRAM FLAP;  Surgeon: Julian Limbo, MD;  [facility];  Service: Plastics;  Laterality: N/A;  Past/Anticipated interventions by medical oncology, if any: Per Dr. Ernestina Penna note: "If there is no other surgical or radiation options, I would offer him systemic chemotherapy, likely cisplatin and 5-FU regimen for total of 4-6 cycles. 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 Reports a good appetite now.  Bowel/Bladder complaints, if any: has a colostomy.  Has a foley catheter that will be taken out next week.  Nausea / Vomiting, if any: no  Pain issues, if any:  no  SAFETY ISSUES:  Prior radiation? Yes - [facility] - Dr. Clayton Lefort - anal area, 2012  Pacemaker/ICD? no  Possible current pregnancy? no  Is the patient on methotrexate? no  Current Complaints / other details:  Patient is HIV +.  He is here with his sister.

## 2014-05-31 NOTE — Telephone Encounter (Signed)
Received call from pt stating that the phones have been out of order at his cardiologist but was able to get in touch with someone there & they will contact us to see what test are needed.  He also states that he hasn't heard from rad onc & is getting a [place] upset that he is having to do all the work.  Reviewed chart & Rad Onc referral was not made in the computer.  Called Rad Onc & made referral & received appt for Dec 9 @ 1 pm with [place] & pt notified.  Referral placed in computer.

## 2014-05-31 NOTE — Telephone Encounter (Signed)
Would schedule lexiscan myoview tuesday or wednesday

## 2014-05-31 NOTE — Telephone Encounter (Signed)
Patient needs to have a stress test before starting chemo. Please advise what to do? Julian Palmer, M.D. (Oncologist) and RN is [facility] for contact call [phone] with oncology.

## 2014-05-31 NOTE — Telephone Encounter (Signed)
Spoke w/ pt.  Advised him of Dr. Donivan Scull recommendation.  Pt is sched for lexiscan @ [facility] 06/09/14 @ 8:00. Reviewed instructions w/ pt.  He verbalizes that he is worried about his foley catheter and hopes that it will be removed before that time.  Advised pt that this will not interfere w/ procedure.  Asked him to call back w/ any further questions or concerns.

## 2014-06-02 ENCOUNTER — Ambulatory Visit
Admission: RE | Admit: 2014-06-02 | Discharge: 2014-06-02 | Disposition: A | Discharge status: Home / Self Care | Payer: Private Health Insurance - Indemnity | Source: Ambulatory Visit | Attending: Radiation Oncology | Admitting: Radiation Oncology

## 2014-06-02 ENCOUNTER — Encounter: Payer: Self-pay | Admitting: Radiation Oncology

## 2014-06-02 VITALS — BP 137/82 | HR 101 | Temp 98.1°F | Resp 16 | Ht 71.0 in | Wt 196.5 lb

## 2014-06-02 DIAGNOSIS — C210 Malignant neoplasm of anus, unspecified: Secondary | ICD-10-CM

## 2014-06-02 NOTE — Progress Notes (Signed)
Please see the Nurse Progress Note in the MD Initial Consult Encounter for this patient. 

## 2014-06-02 NOTE — Progress Notes (Signed)
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 male who is seen out of the courtesy of Dr. Burr Medico for an opinion concerning radiation therapy as part of management of patient's recently diagnosed recurrent anal carcinoma. 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 In 2012 the patient was found to have recurrence in the distal rectal/perianal region with biopsy showing invasive squamous cell carcinoma. The lesion invaded the submucosa and measuring 1.8 cm in size. At that time the patient proceeded to undergo definitive treatment with concurrent chemotherapy and radiation therapy directed at the pelvis/ inguinal areas to 45 gray. The perianal area was boosted further to a cumulative dose of 61.2 gray. Patient did well until earlier this year when he palpated a lesion within the anal canal. He also experienced mild rectal bleeding. The patient was seen by Dr. Hervey Ard and on exam the patient was noted to have a 1 cm firm nodular area just above the dentate line 8 o'clock the clock position, biopsy of this area revealed invasive squamous cell carcinoma. The patient was referred to Dr. Leighton Ruff and on October 29 the patient underwent laparoscopic abdominal perineal resection, closure of ureterotomy and bladder irrigation. Patient also had a vertical rectus abdominis myocutaneous flap placed to the perineum by Dr. Iran Planas.  Pathology from this surgery revealed recurrent invasive poorly differentiated squamous carcinoma invading through the muscularis propria into the pericolonic fatty tissue, extending  to the deep inked margin. There was perineural invasion and angiolymphatic invasion and 7 of 17 lymph nodes were positive for metastatic carcinoma. The tumor measured at least 2.5 cm in size.  The patient was referred to medical oncology in the postoperative setting consideration for additional treatment. Radiation therapy is been consulted for evaluation and consideration for additional treatment in light of the patient's recurrent tumor.   PREVIOUS RADIATION THERAPY: Yes,  The patient received 45 gray in 25 fractions covering the pelvis/inguinal area along with concurrent chemotherapy. Patient received a boost to the perianal region of 16.2 gray for a cumulative dose to this area of 61.2 gray. (2012)  PAST MEDICAL HISTORY:  has a past medical history of S/p bare metal coronary artery stent; Type 2 diabetes mellitus; Hypertension; History of rectal cancer; Hyperlipidemia; History of carcinoma in situ of anal canal; ED (erectile dysfunction); Sigmoid diverticulosis; PONV (postoperative nausea and vomiting); Wears glasses; At risk for sleep apnea; Coronary artery disease (CARDIOLOGIST--  DR Rockey Situ --  LAST LOV 04-17-2013); Anxiety; Depression; Recurrent squamous cell carcinoma of anus; Primary cancer of anal canal (05/18/2011); and HIV positive.    PAST SURGICAL HISTORY: Past Surgical History  Procedure Laterality Date  . Septoplasty  1985  . Sphincterotomy  2009  . Cardiovascular stress test  04/ 2010    low risk study/  very small region of mildly reduced perfersion is mildly reversible dLAD territory  . Resection anal mass  02-05-2011  . Excision anal polyp  2009  . Re-excision anal squamous cell carcinoma  2010  . Removal rectal mass  01-08-2014  . Eua w/ bx perianal skin  06-08-2009  . Port-a-cath placement  02-21-2011  REMOVED  2013  . Coronary angioplasty with stent placement  1998   [facility]    BM stent to pLAD and PCI to Mid and Distal LAD/  no other significant cad  . Inguinal hernia  repair Bilateral 1988  . Colonoscopy with propofol  05-28-2008  . Mass excision N/A 03/26/2014    Procedure: ANAL EXAM UNDER ANESTHESIA,EXCISIONAL BIOPSY OF ANAL MASS;  Surgeon: Leighton Ruff, MD;  Location: [facility];  Service: General;  Laterality: N/A;  . Laparoscopic assisted abdominal perineal resection N/A 04/22/2014    Procedure: LAPAROSCOPIC  ABDOMINAL PERINEAL RESECTION, ;  Surgeon: Leighton Ruff, MD;  Location: [facility];  Service: General;  Laterality: N/A;  . Urethrotomy Bilateral 04/22/2014    Procedure: CYSTOSCOPY/URETHROTOMY;  Surgeon: Leighton Ruff, MD;  Location: [facility];  Service: General;  Laterality: Bilateral;  . Muscle flap closure N/A 04/22/2014    Procedure: VRAM FLAP;  Surgeon: Irene Limbo, MD;  Location: [facility];  Service: Plastics;  Laterality: N/A;    FAMILY HISTORY: family history includes Breast cancer in his mother; Coronary artery disease in his father; Diabetes type II in his mother; Hypertension in his mother; Uterine cancer in his paternal grandmother.  SOCIAL HISTORY:  reports that he has never smoked. He has never used smokeless tobacco. He reports that he does not drink alcohol or use illicit drugs.  ALLERGIES: Sulfa antibiotics  MEDICATIONS:  Current Outpatient Prescriptions  Medication Sig Dispense Refill  . aspirin (ASPIR-81) 81 MG EC tablet Take 81 mg by mouth daily.     Marland Kitchen atorvastatin (LIPITOR) 80 MG tablet Take 80 mg by mouth every evening.    . Blood Glucose Monitoring Suppl (FIFTY50 GLUCOSE METER 2.0) W/DEVICE KIT Use 1 each as directed.    . citalopram (CELEXA) 40 MG tablet Take 40 mg by mouth every morning.    Marland Kitchen efavirenz-emtrictabine-tenofovir (ATRIPLA) 600-200-300 MG per tablet Take 1 tablet by mouth at bedtime.     Marland Kitchen glimepiride (AMARYL) 2 MG tablet Take 4 mg by mouth 2 (two) times daily.     Marland Kitchen glucose blood ([employer] BLOOD GLUCOSE TEST STRIPS) test strip Use 3 (three) times daily. Use as instructed.    . insulin glargine  (LANTUS) 100 UNIT/ML injection Inject 0.25 mLs (25 Units total) into the skin at bedtime. 10 mL 2  . Lancet Devices ([employer] LANCING DEVICE) MISC Use 1 each once daily. Use as instructed.    Marland Kitchen losartan (COZAAR) 100 MG tablet Take 100 mg by mouth every evening.    . metFORMIN (GLUCOPHAGE) 1000 MG tablet Take 1,000 mg by mouth 2 (two) times daily.      . Multiple Vitamins-Minerals (MULTIVITAL) tablet Take 1 tablet by mouth every evening.     . niacin (NIASPAN) 1000 MG CR tablet Take 1,000 mg by mouth at bedtime.    . nitrofurantoin (MACRODANTIN) 50 MG capsule     . traZODone (DESYREL) 100 MG tablet Take 200 mg by mouth at bedtime.    Marland Kitchen acetaminophen (TYLENOL) 500 MG tablet Take 1,000 mg by mouth every 6 (six) hours as needed for mild pain or moderate pain.     Marland Kitchen aspirin-acetaminophen-caffeine (EXCEDRIN MIGRAINE) 250-250-65 MG per tablet Take 2 tablets by mouth every 6 (six) hours as needed for headache or migraine.    Marland Kitchen oxyCODONE (OXY IR/ROXICODONE) 5 MG immediate release tablet Take 1-3 tablets (5-15 mg total) by mouth every 4 (four) hours as needed for moderate pain, severe pain or breakthrough pain. (Patient not taking: Reported  on 05/25/2014) 30 tablet 0   No current facility-administered medications for this encounter.    REVIEW OF SYSTEMS:  A 15 point review of systems is documented in the electronic medical record. This was obtained by the nursing staff. However, I reviewed this with the patient to discuss relevant findings and make appropriate changes. He denies any pain in the pelvis at this time. He is uncomfortable with sitting for long periods of time and stands for most of the evaluation today. Patient ambulates with the assistance of walker primarily holding his Foley bag.  He is accompanied by his sister valuation today   PHYSICAL EXAM:  height is _0  (1.803 m) and weight is 196 lb 8 oz (89.132 kg). His oral temperature is 98.1 F (36.7 C). His blood pressure is 137/82 and his pulse is  101. His respiration is 16.  this is a very pleasant middleaged gentleman in no acute distress. Examination of the neck and supraclavicular region reveals no evidence of adenopathy. The axillary areas are free of adenopathy. The lungs are clear to auscultation. The heart has a regular rhythm and rate. The abdomen is soft and nontender with normal bowel sounds. Patient has a colostomy bag in place in the left lower quadrant which is functioning well without signs infection. Patient also has a long vertical scar in the upper abdominal area from his donor site for his flap. No signs of drainage or infection in this area. The inguinal areas are free of adenopathy. Patient is a normal circumcised male. He has a Foley catheter in place at this time. Examination of the perineum reveals a flap in place. This is healing well. There continues to be some mild drainage along the flap without obvious signs of infection.    ECOG = 1   1 - Symptomatic but completely ambulatory (Restricted in physically strenuous activity but ambulatory and able to carry out work of a light or sedentary nature. For example, light housework, office work)  LABORATORY DATA:  Lab Results  Component Value Date   WBC 8.3 05/25/2014   HGB 10.9* 05/25/2014   HCT 34.4* 05/25/2014   MCV 92.5 05/25/2014   PLT 376 05/25/2014   NEUTROABS 5.5 05/25/2014   Lab Results  Component Value Date   NA 135* 05/25/2014   K 4.1 05/25/2014   CL 102 04/25/2014   CO2 24 05/25/2014   GLUCOSE 103 05/25/2014   CREATININE 0.9 05/25/2014   CALCIUM 9.3 05/25/2014      RADIOGRAPHY: Dg Cystogram Voiding  05/17/2014   CLINICAL DATA:  Rectal cancer.  Bilateral urethrotomy on 04/22/2014.  EXAM: VOIDING CYSTOURETHROGRAM  TECHNIQUE: After catheterization of the urinary bladder following sterile technique by nursing personnel, the bladder was filled with ml Cysto-hypaque 30% by drip infusion. Serial spot images were obtained during bladder filling and  voiding.  FLUOROSCOPY TIME:  5 min and 19 seconds  COMPARISON:  PET 01/19/2014.  FINDINGS: Preprocedure scout film demonstrates surgical clips in the pelvis.  Contrast was injected into the patient's urinary bladder via the Foley catheter. No evidence of bladder extravasation.  Mild limitation secondary to patient's inability to void fully. Given this factor, the urethra is normal in appearance, without contrast extravasation.  IMPRESSION: 1. Mild degradation secondary to patient's inability to void fully. 2. Given this factor, normal appearance of the urethra.   Electronically Signed   By: Abigail Miyamoto M.D.   On: 05/17/2014 11:18      IMPRESSION: recurrent squamous cell carcinoma of the  anus. At time of initial diagnosis the patient received full dose radiation therapy to the pelvis along with radiosensitizing chemotherapy. He has developed a significant recurrence with positive deep margin and several lymph nodes positive. He would be at risk for additional recurrence. In light of his prior full dose radiation therapy he would not be a candidate for additional radiation therapy to this area especially in light of the short interval to recurrence.  I would recommend patient proceed with chemotherapy under the direction of Dr. Burr Medico once the patient has recovered fully from his extensive surgery.    ------------------------------------------------  Blair Promise, PhD, MD

## 2014-06-08 ENCOUNTER — Encounter: Payer: Self-pay | Admitting: *Deleted

## 2014-06-09 ENCOUNTER — Ambulatory Visit: Payer: Self-pay | Admitting: Cardiovascular Disease

## 2014-06-09 DIAGNOSIS — I2510 Atherosclerotic heart disease of native coronary artery without angina pectoris: Secondary | ICD-10-CM

## 2014-06-11 ENCOUNTER — Encounter (HOSPITAL_COMMUNITY): Payer: Self-pay | Admitting: *Deleted

## 2014-06-11 ENCOUNTER — Other Ambulatory Visit (INDEPENDENT_AMBULATORY_CARE_PROVIDER_SITE_OTHER): Payer: Self-pay | Admitting: General Surgery

## 2014-06-11 NOTE — Progress Notes (Signed)
Please put orders in Epic for Same day surgery 06-24-14 patient is unable to come in for pre op visit Thanks

## 2014-06-14 ENCOUNTER — Encounter (HOSPITAL_COMMUNITY): Payer: Self-pay | Admitting: *Deleted

## 2014-06-14 ENCOUNTER — Other Ambulatory Visit: Payer: Self-pay

## 2014-06-14 DIAGNOSIS — I2510 Atherosclerotic heart disease of native coronary artery without angina pectoris: Secondary | ICD-10-CM

## 2014-06-14 DIAGNOSIS — Z01818 Encounter for other preprocedural examination: Secondary | ICD-10-CM

## 2014-06-22 ENCOUNTER — Encounter: Payer: Self-pay | Admitting: Hematology

## 2014-06-22 ENCOUNTER — Ambulatory Visit (HOSPITAL_BASED_OUTPATIENT_CLINIC_OR_DEPARTMENT_OTHER): Payer: Private Health Insurance - Indemnity | Admitting: Hematology

## 2014-06-22 ENCOUNTER — Telehealth: Payer: Self-pay | Admitting: Hematology

## 2014-06-22 VITALS — BP 136/84 | HR 87 | Temp 98.2°F | Resp 19 | Ht 71.0 in | Wt 195.2 lb

## 2014-06-22 DIAGNOSIS — B20 Human immunodeficiency virus [HIV] disease: Secondary | ICD-10-CM

## 2014-06-22 DIAGNOSIS — C211 Malignant neoplasm of anal canal: Secondary | ICD-10-CM

## 2014-06-22 MED ORDER — ONDANSETRON HCL 8 MG PO TABS: 8.0000 mg | ORAL_TABLET | Freq: Three times a day (TID) | ORAL | Status: DC | PRN

## 2014-06-22 NOTE — Telephone Encounter (Signed)
gv adn printed appt sched and avs for pt for Dec and Jan 2016....emailed Md for clarification of tx.

## 2014-06-22 NOTE — Progress Notes (Signed)
Patient seems very upset today, saying he was "losing it".  He is very tearful.   Asked him directly if he had any thoughts of harming himself and he replied "no, b/c he thought that was wrong".  After discussion with patient, the most upsetting thing is his foley catheter.  He already has a colostomy.  He thought the foley was going to be temporary, however his urologist is now talking about a permanent supra-pubic catheter.  Patient states "He is changing the story" referring to the urologist.  Patient states that if he has the catheter permanently he may "just end it all.".   Notified Dr. Burr Medico of discussion before her visit with him  and also Vernie Shanks, Social Worker who will follow up with patient.

## 2014-06-22 NOTE — Progress Notes (Signed)
[place] NOTE  Patient Care Team: [name], MD as PCP - General (Infectious Diseases) Robert Bellow, MD (General Surgery) Leighton Ruff, MD as Consulting Physician (General Surgery) Irene Limbo, MD as Consulting Physician (Plastic Surgery) Truitt Merle, MD as Consulting Physician (Hematology) Festus Aloe, MD as Consulting Physician (Urology)  DIAGNOSIS Recurrent anal cancer    Primary cancer of anal canal   02/05/2011 Initial Diagnosis Primary cancer of anal canal, T1N0M0, s/p surgical resection with positive margines. He received definitive dose RT 45 Gy over 5 weeks with 16Gy boosting and concurrent chemo with 5FU, treated at [facility] by Drs.  Geoffreyh and [employer].    11/23/2013 Relapse/Recurrence local recurrence    04/21/2014 Surgery abdominal perianal resection (APR) and VRAM flap closure of perineal wound on 04/21/14, surgical margins were positive for cancer      OTHER RELATED ISSUES: 1. (+) HIV, on therapy with good control  2. CAD, s/p stent placement in 1998  3. Urine leakage after surgery on 04/21/14, has a foley in place now   CURRENT THERAPY: 5-FU 1000mg /m2/day on D1-5, ciplatin 100mg /m2 on D2, every 28 days, starting 06/28/2014  HISTORY OF PRESENTING ILLNESS:  He was seen by urologist Dr. Junious Silk a few weeks ago who recommended keeping foley during his chemotherapy, and possible superpubic catheter placement if still not healed. He is very upset and frustrated about that he is not able to urinate on his own for a long period of time. He is very concerned about his quality of life.   His perianal pain has much improved overtime, he is able to sit down comfortably. The discharge perianal surgical sites is much less now. No fever or chills.   He has good appetite and eats well. He denies any other new symptoms.   MEDICAL HISTORY:  Past Medical History  Diagnosis Date  . S/p bare metal coronary artery stent      1998  pLAD  . Type 2 diabetes mellitus   . Hypertension   . 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 Hyperlipidemia   . History of carcinoma in situ of anal canal     2010  . ED (erectile dysfunction)   . Sigmoid diverticulosis   . PONV (postoperative nausea and vomiting)   . Wears glasses   . At risk for sleep apnea     STOP-BANG= 4     SENT TO PCP 03-23-2014  . Coronary artery disease CARDIOLOGIST--  DR Rockey Situ --  LAST LOV 04-17-2013    S/P  PCI to mid and distal LAD and BM stent x1 to pLAD  . Anxiety   . Depression   . HIV positive   . Recurrent squamous cell carcinoma of anus     removal mass 01-08-2014  . Primary cancer of anal canal 05/18/2011    SURGICAL HISTORY: Past Surgical History  Procedure Laterality Date  . Septoplasty  1985  . Sphincterotomy  2009  . Cardiovascular stress test  04/ 2010    low risk study/  very small region of mildly reduced perfersion is mildly reversible dLAD territory  . Resection anal mass  02-05-2011  . Excision anal polyp  2009  . Re-excision anal squamous cell carcinoma  2010  . Removal rectal mass  01-08-2014  . Eua w/ bx perianal skin  06-08-2009  . Port-a-cath placement  02-21-2011    REMOVED  2013  . Coronary angioplasty with stent placement  1998   [facility]    BM stent to pLAD and PCI to Mid and Distal LAD/  no other significant cad  . Inguinal hernia repair Bilateral 1988  . Colonoscopy with propofol  05-28-2008  . Mass excision N/A 03/26/2014    Procedure: ANAL EXAM UNDER ANESTHESIA,EXCISIONAL BIOPSY OF ANAL MASS;  Surgeon: Leighton Ruff, MD;  Location: [place];  Service: General;  Laterality: N/A;  . Laparoscopic assisted abdominal perineal resection N/A 04/22/2014    Procedure: LAPAROSCOPIC  ABDOMINAL PERINEAL RESECTION, ;  Surgeon: Leighton Ruff, MD;  Location: [facility];  Service: General;  Laterality: N/A;  . Urethrotomy Bilateral 04/22/2014    Procedure: CYSTOSCOPY/URETHROTOMY;  Surgeon: Leighton Ruff, MD;  Location: [facility];  Service: General;  Laterality: Bilateral;  . Muscle flap closure N/A 04/22/2014    Procedure: VRAM FLAP;  Surgeon: Irene Limbo, MD;  Location: [facility];  Service: Plastics;  Laterality: N/A;    SOCIAL HISTORY: History   Social History  . Marital Status: Single    Spouse Name: N/A    Number of Children: 0  . Years of Education: N/A   Occupational History  .  [employer] History Main Topics  . Smoking status: Never Smoker   . Smokeless tobacco: Never Used  . Alcohol Use: No     Comment: RARE  . Drug Use: No  . Sexual Activity: Not on file   Other Topics Concern  . Not on file   Social History Narrative   Full time. Single. Does not regularly exercise.     FAMILY HISTORY: Family History  Problem Relation Age of Onset  . Diabetes type II Mother   . Breast cancer Mother 4     survivor  . Hypertension Mother   . Coronary artery disease Father   Paternal GM had utrine cancer, no other history of malignancy   ALLERGIES:  is allergic to sulfa antibiotics.  MEDICATIONS:  Current Outpatient Prescriptions  Medication Sig Dispense Refill  . aspirin (ASPIR-81) 81 MG EC tablet Take 81 mg by mouth at bedtime.     Marland Kitchen atorvastatin (LIPITOR) 80 MG tablet Take 80 mg by mouth every evening.    . citalopram (CELEXA) 40 MG tablet Take 40 mg by mouth every morning.    Marland Kitchen efavirenz-emtrictabine-tenofovir (ATRIPLA) 600-200-300 MG per tablet Take 1 tablet by mouth at bedtime.     Marland Kitchen glimepiride (AMARYL) 2 MG tablet Take 4 mg by mouth 2 (two) times daily.     Marland Kitchen glucose blood ([employer]S BLOOD GLUCOSE TEST STRIPS) test strip 1 each by Other route 2 (two) times daily.     . insulin glargine (LANTUS) 100 UNIT/ML injection Inject 0.25 mLs (25 Units total) into the skin at bedtime. (Patient taking differently: Inject 25  Units into the skin at bedtime as needed (blood sugar.). For Levels above 110) 10 mL 2  . Lancet Devices ([employer]S LANCING DEVICE) MISC 1 each by Other route 2 (two) times daily. Use 1 each once daily. Use as instructed.    Marland Kitchen losartan (COZAAR) 100 MG tablet Take 100 mg by mouth every evening.    . metFORMIN (GLUCOPHAGE) 1000 MG tablet Take 1,000 mg by mouth 2 (two) times daily.      . Multiple Vitamins-Minerals (MULTIVITAL) tablet Take 1 tablet by mouth every evening.     . niacin (NIASPAN)  1000 MG CR tablet Take 1,000 mg by mouth at bedtime.    . nitrofurantoin (MACRODANTIN) 50 MG capsule Take 50 mg by mouth at bedtime.     . traZODone (DESYREL) 100 MG tablet Take 200 mg by mouth at bedtime.    . ondansetron (ZOFRAN) 8 MG tablet Take 1 tablet (8 mg total) by mouth every 8 (eight) hours as needed for nausea or vomiting. 60 tablet 3  . oxyCODONE (OXY IR/ROXICODONE) 5 MG immediate release tablet Take 1-3 tablets (5-15 mg total) by mouth every 4 (four) hours as needed for moderate pain, severe pain or breakthrough pain. (Patient not taking: Reported on 05/25/2014) 30 tablet 0   No current facility-administered medications for this visit.    REVIEW OF SYSTEMS:   Constitutional: Denies fevers, chills or abnormal night sweats Eyes: Denies blurriness of vision, double vision or watery eyes Ears, nose, mouth, throat, and face: Denies mucositis or sore throat Respiratory: Denies cough, dyspnea or wheezes Cardiovascular: Denies palpitation, chest discomfort or lower extremity swelling Gastrointestinal:  Denies nausea, heartburn or change in bowel habits Skin: Denies abnormal skin rashes Lymphatics: Denies new lymphadenopathy or easy bruising Neurological:Denies numbness, tingling or new weaknesses Behavioral/Psych: Mood is stable, no new changes  All other systems were reviewed with the patient and are negative.  PHYSICAL EXAMINATION: ECOG PERFORMANCE STATUS: 1 - Symptomatic but completely  ambulatory  Filed Vitals:   06/22/14 0821  BP: 136/84  Pulse: 87  Temp: 98.2 F (36.8 C)  Resp: 19   Filed Weights   06/22/14 0821  Weight: 195 lb 3.2 oz (88.542 kg)    GENERAL:alert, no distress and comfortable SKIN: skin color, texture, turgor are normal, no rashes or significant lesions EYES: normal, conjunctiva are pink and non-injected, sclera clear OROPHARYNX:no exudate, no erythema and lips, buccal mucosa, and tongue normal  NECK: supple, thyroid normal size, non-tender, without nodularity LYMPH:  no palpable lymphadenopathy in the cervical, axillary or inguinal LUNGS: clear to auscultation and percussion with normal breathing effort HEART: regular rate & rhythm and no murmurs and no lower extremity edema ABDOMEN:abdomen soft, non-tender and normal bowel sounds. The perianal reconstruction site is much healed, with minimum clear discharge.  Musculoskeletal:no cyanosis of digits and no clubbing  PSYCH: alert & oriented x 3 with fluent speech NEURO: no focal motor/sensory deficits  LABORATORY DATA:  I have reviewed the data as listed Lab Results  Component Value Date   WBC 8.3 05/25/2014   HGB 10.9* 05/25/2014   HCT 34.4* 05/25/2014   MCV 92.5 05/25/2014   PLT 376 05/25/2014    Recent Labs  04/23/14 0545 04/24/14 0420 04/25/14 0505 05/25/14 1508  NA 136* 133* 138 135*  K 4.5 3.8 3.9 4.1  CL 100 98 102  --   CO2 22 26 25 24   GLUCOSE 299* 239* 250* 103  BUN 13 14 12  13.9  CREATININE 0.89 0.86 0.74 0.9  CALCIUM 8.0* 8.0* 7.9* 9.3  GFRNONAA >90 >90 >90  --   GFRAA >90 >90 >90  --   PROT  --   --   --  8.6*  ALBUMIN  --   --   --  3.3*  AST  --   --   --  29  ALT  --   --   --  57*  ALKPHOS  --   --   --  128  BILITOT  --   --   --  0.20   PATHOLOGY REPORT 04/22/2014 Diagnosis 1. Soft  tissue, biopsy, right pelvic sidewall; r/o malignancy - FIBROADIPOSE TISSUE, NO EVIDENCE OF MALIGNANCY. 2. Colon, segmental resection for tumor, rectum,sigmoid and  anus - RECURRENT INVASIVE POORLY DIFFERENTIATED SQUAMOUS CELL CARCINOMA, INVADING THROUGH THE MUSCULARIS PROPRIA, INTO PERICOLONIC FATTY TISSUE, EXTENDING TO THE DEEP INKED MARGIN. - PERINEURAL INVASION AND ANGIOLYMPHATIC PRESENT. - SEVEN OF SEVENTEEN LYMPH NODES, POSITIVE FOR METASTATIC CARCINOMA (7/17) Microscopic Comment 2. Anus Specimen: Anus, rectum, sigmoid Procedure: Segmental resection Tumor site: Distal ends of specimen Specimen integrity: Intact Invasive tumor: Maximum size: At least 2.5 cm Histologic type(s): Invasive squamous cell carcinoma Histologic grade and differentiation: G2-3, moderately to poorly differentiated Microscopic extension of invasive tumor: Invading through the muscularis propria into pericolonic fatty tissue involving adjacent skeletal muscle tissue Lymph-Vascular invasion: Present Peri-neural invasion: Present Resection margins: Deep margin is positive for tumor Treatment effect (neo-adjuvant therapy): Present, minimally Lymph nodes: number examined 17; number positive: 7 Pathologic Staging: ypT2, yN1 (R1) Ancillary studies: N/A  RADIOGRAPHIC STUDIES: I have personally reviewed the radiological images as listed and agreed with the findings in the report. Dg Cystogram Voiding 05/17/2014    IMPRESSION:  1. Mild degradation secondary to patient's inability to void fully.  2. Given this factor, normal appearance of the urethra.     PET/CT 01/19/2014 at [facility]  Negative for hypermetabolic lesion or metastatic disease   ASSESSMENT & PLAN:  53 years old male with past medical history of anal squamous cell carcinoma, status post surgical resection and concurrent chemoradiation in 2009, now has local recurrence status post APR in the reconstruction.ypT2, yN1 (R1), unfortunately his deep surgical margin was positive, and 7 out of 17 lymph nodes were positive also. No distant metastasis on the preop PET scan. His medical history is also  complicated by diabetes, coronary artery disease status post stent placement, and HIV on treatment. Performance status 1.  1. Local recurrence of anal squamous cell carcinoma, with positive surgical margines -Per rad/onc, he is not a candidate for re-irradiation  -I again discussed that the role of chemotherapy is to control the residual disease after surgery. Chemotherapy alone is unlikely going to be curative also. -I would offer him systemic chemotherapy with cisplatin and 5-FU regimen for total of 4-6 cycles. He has good PS and organ functions, his HIV is under good control, his recent CD4 is normal at 710. His recent cardiac stress test was negative, and his cardiologist has cleared him.  -We discussed the potential side effects from chemotherapy, which include but not limited to, fatigue, nausea, hair loss, anorexia, anemia, risk or infections, bleeding, neuropathy, hearing loss, heart attack, etc. He voiced good understanding and agreed to proceed. Chemotherapy consent was obtained today. -We also discussed the quality of life. He voiced that he prefers quality-of-life more than quantity of life. If he experiences severe side effects from chemotherapy, I would have low threshold to reduce his dose or hold chemotherapy.   2. HIV  -Well controlled. Continue his treatment . His recent CD4 count was normal.   3. Hypertension and diabetes, coronary artery disease  -He will continue follow-up with his primary care physician and cardiologist. Continue medications. I'll monitor his blood pressure, blood sugar during his chemotherapy. I discussed that we sometimes use steroids during chemotherapy treatment, which can cause hyperglycemia.   4. Urethra leakage -He is to follow-up with his urologist. He will likely keep the Foley during his chemotherapy treatment in the next 4 months. I discussed the increased risk of UTI especially after chemotherapy. He will continue  prophylactic nitrofurantoin.    Plan #1 he is scheduled to have a port placement tomorrow by Dr. Marcello Moores  #2 I will schedule his first cycle of chemotherapy for next Monday, 06/28/2013. Cisplatin on day 2 and 5-FU from day 1 today 5  #3 CT of the abdomen and pelvis with IV contrast this week as a baseline scan.   I plan to see him back in 2 weeks for chemotherapy toxicity check up.  Of note, his family members do not know his HIV status. I ensured him this will be kept confidential.  Orders Placed This Encounter  Procedures  . CT Abdomen Pelvis W Contrast    Standing Status: Future     Number of Occurrences:      Standing Expiration Date: 06/22/2015    Order Specific Question:  Reason for Exam (SYMPTOM  OR DIAGNOSIS REQUIRED)    Answer:  recurrence of anal cancer, s/p surgery    Order Specific Question:  Preferred imaging location?    Answer:  [facility]  . CBC with Differential    Standing Status: Standing     Number of Occurrences: 20     Standing Expiration Date: 06/23/2015  . Comprehensive metabolic panel (Cmet) - [facility]    Standing Status: Standing     Number of Occurrences: 20     Standing Expiration Date: 06/23/2015     No problem-specific assessment & plan notes found for this encounter.    All questions were answered. The patient knows to call the clinic with any problems, questions or concerns.   I spent 20 minutes counseling the patient face to face. The total time spent in the appointment was 30 minutes and more than 50% was on counseling.     Truitt Merle, MD 06/22/2014 6:25 PM

## 2014-06-23 ENCOUNTER — Ambulatory Visit (HOSPITAL_BASED_OUTPATIENT_CLINIC_OR_DEPARTMENT_OTHER): Payer: Private Health Insurance - Indemnity

## 2014-06-23 ENCOUNTER — Ambulatory Visit (HOSPITAL_COMMUNITY)
Admission: RE | Admit: 2014-06-23 | Discharge: 2014-06-23 | Disposition: A | Discharge status: Home / Self Care | Payer: Managed Care, Other (non HMO) | Source: Ambulatory Visit | Attending: Hematology | Admitting: Hematology

## 2014-06-23 ENCOUNTER — Telehealth: Payer: Self-pay | Admitting: Hematology

## 2014-06-23 DIAGNOSIS — C20 Malignant neoplasm of rectum: Secondary | ICD-10-CM | POA: Insufficient documentation

## 2014-06-23 DIAGNOSIS — Z933 Colostomy status: Secondary | ICD-10-CM | POA: Insufficient documentation

## 2014-06-23 DIAGNOSIS — K760 Fatty (change of) liver, not elsewhere classified: Secondary | ICD-10-CM | POA: Diagnosis not present

## 2014-06-23 DIAGNOSIS — K8020 Calculus of gallbladder without cholecystitis without obstruction: Secondary | ICD-10-CM | POA: Insufficient documentation

## 2014-06-23 DIAGNOSIS — C211 Malignant neoplasm of anal canal: Secondary | ICD-10-CM

## 2014-06-23 DIAGNOSIS — I2510 Atherosclerotic heart disease of native coronary artery without angina pectoris: Secondary | ICD-10-CM | POA: Diagnosis not present

## 2014-06-23 DIAGNOSIS — D1803 Hemangioma of intra-abdominal structures: Secondary | ICD-10-CM | POA: Diagnosis not present

## 2014-06-23 DIAGNOSIS — M6258 Muscle wasting and atrophy, not elsewhere classified, other site: Secondary | ICD-10-CM | POA: Insufficient documentation

## 2014-06-23 LAB — CBC WITH DIFFERENTIAL/PLATELET
BASO%: 0.4 % (ref 0.0–2.0)
Basophils Absolute: 0 10*3/uL (ref 0.0–0.1)
EOS%: 2.2 % (ref 0.0–7.0)
Eosinophils Absolute: 0.2 10*3/uL (ref 0.0–0.5)
HCT: 36 % — ABNORMAL LOW (ref 38.4–49.9)
HGB: 11.8 g/dL — ABNORMAL LOW (ref 13.0–17.1)
LYMPH%: 31.4 % (ref 14.0–49.0)
MCH: 29.5 pg (ref 27.2–33.4)
MCHC: 32.8 g/dL (ref 32.0–36.0)
MCV: 90 fL (ref 79.3–98.0)
MONO#: 0.8 10*3/uL (ref 0.1–0.9)
MONO%: 10 % (ref 0.0–14.0)
NEUT#: 4.3 10*3/uL (ref 1.5–6.5)
NEUT%: 56 % (ref 39.0–75.0)
PLATELETS: 264 10*3/uL (ref 140–400)
RBC: 4 10*6/uL — AB (ref 4.20–5.82)
RDW: 13.2 % (ref 11.0–14.6)
WBC: 7.7 10*3/uL (ref 4.0–10.3)
lymph#: 2.4 10*3/uL (ref 0.9–3.3)

## 2014-06-23 LAB — COMPREHENSIVE METABOLIC PANEL (CC13)
ALT: 44 U/L (ref 0–55)
ANION GAP: 10 meq/L (ref 3–11)
AST: 30 U/L (ref 5–34)
Albumin: 3.8 g/dL (ref 3.5–5.0)
Alkaline Phosphatase: 98 U/L (ref 40–150)
BUN: 14.6 mg/dL (ref 7.0–26.0)
CHLORIDE: 101 meq/L (ref 98–109)
CO2: 26 meq/L (ref 22–29)
CREATININE: 1 mg/dL (ref 0.7–1.3)
Calcium: 9.2 mg/dL (ref 8.4–10.4)
EGFR: 87 mL/min/{1.73_m2} — AB (ref >90)
GLUCOSE: 90 mg/dL (ref 70–140)
Potassium: 4 mEq/L (ref 3.5–5.1)
Sodium: 137 mEq/L (ref 136–145)
Total Bilirubin: 0.22 mg/dL (ref 0.20–1.20)
Total Protein: 8.8 g/dL — ABNORMAL HIGH (ref 6.4–8.3)

## 2014-06-23 MED ORDER — IOHEXOL 300 MG/ML  SOLN
50.0000 mL | Freq: Once | INTRAMUSCULAR | Status: AC | PRN
  Administered 2014-06-23: 50 mL via ORAL

## 2014-06-23 MED ORDER — IOHEXOL 300 MG/ML  SOLN
100.0000 mL | Freq: Once | INTRAMUSCULAR | Status: AC | PRN
  Administered 2014-06-23: 100 mL via INTRAVENOUS

## 2014-06-23 NOTE — Telephone Encounter (Signed)
s.w. pt and advised on Jan appts...pt ok and aware of all d.t

## 2014-06-24 ENCOUNTER — Encounter (HOSPITAL_COMMUNITY)
Admission: RE | Disposition: A | Discharge status: Home / Self Care | Payer: Self-pay | Source: Ambulatory Visit | Attending: General Surgery

## 2014-06-24 ENCOUNTER — Encounter (HOSPITAL_COMMUNITY): Payer: Self-pay | Admitting: *Deleted

## 2014-06-24 ENCOUNTER — Ambulatory Visit (HOSPITAL_COMMUNITY): Payer: Private Health Insurance - Indemnity

## 2014-06-24 ENCOUNTER — Ambulatory Visit (HOSPITAL_COMMUNITY): Payer: Private Health Insurance - Indemnity | Admitting: Anesthesiology

## 2014-06-24 ENCOUNTER — Telehealth: Payer: Self-pay | Admitting: *Deleted

## 2014-06-24 ENCOUNTER — Telehealth: Payer: Self-pay | Admitting: Hematology

## 2014-06-24 ENCOUNTER — Other Ambulatory Visit: Payer: Private Health Insurance - Indemnity

## 2014-06-24 ENCOUNTER — Other Ambulatory Visit: Payer: Self-pay | Admitting: *Deleted

## 2014-06-24 ENCOUNTER — Ambulatory Visit (HOSPITAL_COMMUNITY)
Admission: RE | Admit: 2014-06-24 | Discharge: 2014-06-24 | Disposition: A | Discharge status: Home / Self Care | Payer: Private Health Insurance - Indemnity | Source: Ambulatory Visit | Attending: General Surgery | Admitting: General Surgery

## 2014-06-24 DIAGNOSIS — C210 Malignant neoplasm of anus, unspecified: Secondary | ICD-10-CM | POA: Diagnosis not present

## 2014-06-24 DIAGNOSIS — I2510 Atherosclerotic heart disease of native coronary artery without angina pectoris: Secondary | ICD-10-CM | POA: Diagnosis not present

## 2014-06-24 DIAGNOSIS — C211 Malignant neoplasm of anal canal: Secondary | ICD-10-CM

## 2014-06-24 DIAGNOSIS — E785 Hyperlipidemia, unspecified: Secondary | ICD-10-CM | POA: Insufficient documentation

## 2014-06-24 DIAGNOSIS — Z7982 Long term (current) use of aspirin: Secondary | ICD-10-CM | POA: Insufficient documentation

## 2014-06-24 DIAGNOSIS — Z95828 Presence of other vascular implants and grafts: Secondary | ICD-10-CM

## 2014-06-24 DIAGNOSIS — Z21 Asymptomatic human immunodeficiency virus [HIV] infection status: Secondary | ICD-10-CM | POA: Diagnosis not present

## 2014-06-24 DIAGNOSIS — I10 Essential (primary) hypertension: Secondary | ICD-10-CM | POA: Diagnosis not present

## 2014-06-24 DIAGNOSIS — Z452 Encounter for adjustment and management of vascular access device: Secondary | ICD-10-CM

## 2014-06-24 DIAGNOSIS — E119 Type 2 diabetes mellitus without complications: Secondary | ICD-10-CM | POA: Insufficient documentation

## 2014-06-24 HISTORY — PX: PORTACATH PLACEMENT: SHX2246

## 2014-06-24 LAB — GLUCOSE, CAPILLARY
GLUCOSE-CAPILLARY: 213 mg/dL — AB (ref 70–99)
Glucose-Capillary: 241 mg/dL — ABNORMAL HIGH (ref 70–99)

## 2014-06-24 SURGERY — INSERTION, TUNNELED CENTRAL VENOUS DEVICE, WITH PORT
Anesthesia: General | Site: Chest | Laterality: Left

## 2014-06-24 MED ORDER — CEFAZOLIN SODIUM-DEXTROSE 2-3 GM-% IV SOLR
2.0000 g | INTRAVENOUS | Status: AC
  Administered 2014-06-24: 2 g via INTRAVENOUS

## 2014-06-24 MED ORDER — SODIUM CHLORIDE 0.9 % IJ SOLN
INTRAMUSCULAR | Status: AC
  Filled 2014-06-24: qty 10

## 2014-06-24 MED ORDER — PROPOFOL 10 MG/ML IV BOLUS
INTRAVENOUS | Status: DC | PRN
  Administered 2014-06-24: 200 mg via INTRAVENOUS

## 2014-06-24 MED ORDER — PROMETHAZINE HCL 25 MG/ML IJ SOLN: 6.2500 mg | INTRAMUSCULAR | Status: DC | PRN

## 2014-06-24 MED ORDER — HEPARIN SOD (PORK) LOCK FLUSH 100 UNIT/ML IV SOLN
INTRAVENOUS | Status: DC | PRN
  Administered 2014-06-24: 500 [IU] via INTRAVENOUS

## 2014-06-24 MED ORDER — DEXAMETHASONE SODIUM PHOSPHATE 10 MG/ML IJ SOLN
INTRAMUSCULAR | Status: AC
  Filled 2014-06-24: qty 1

## 2014-06-24 MED ORDER — EPHEDRINE SULFATE 50 MG/ML IJ SOLN
INTRAMUSCULAR | Status: AC
  Filled 2014-06-24: qty 1

## 2014-06-24 MED ORDER — FENTANYL CITRATE 0.05 MG/ML IJ SOLN
INTRAMUSCULAR | Status: AC
  Filled 2014-06-24: qty 5

## 2014-06-24 MED ORDER — SODIUM CHLORIDE 0.9 % IV SOLN: 250.0000 mL | INTRAVENOUS | Status: DC | PRN

## 2014-06-24 MED ORDER — DEXAMETHASONE SODIUM PHOSPHATE 10 MG/ML IJ SOLN
INTRAMUSCULAR | Status: DC | PRN
  Administered 2014-06-24: 10 mg via INTRAVENOUS

## 2014-06-24 MED ORDER — BUPIVACAINE HCL 0.25 % IJ SOLN
INTRAMUSCULAR | Status: DC | PRN
  Administered 2014-06-24: 10 mL

## 2014-06-24 MED ORDER — ACETAMINOPHEN 325 MG PO TABS: 650.0000 mg | ORAL_TABLET | ORAL | Status: DC | PRN

## 2014-06-24 MED ORDER — 0.9 % SODIUM CHLORIDE (POUR BTL) OPTIME
TOPICAL | Status: DC | PRN
  Administered 2014-06-24: 1000 mL

## 2014-06-24 MED ORDER — MEPERIDINE HCL 50 MG/ML IJ SOLN: 6.2500 mg | INTRAMUSCULAR | Status: DC | PRN

## 2014-06-24 MED ORDER — SODIUM CHLORIDE 0.9 % IR SOLN
Freq: Once | Status: DC
  Filled 2014-06-24: qty 1.2

## 2014-06-24 MED ORDER — MIDAZOLAM HCL 2 MG/2ML IJ SOLN
INTRAMUSCULAR | Status: AC
  Filled 2014-06-24: qty 2

## 2014-06-24 MED ORDER — FENTANYL CITRATE 0.05 MG/ML IJ SOLN
INTRAMUSCULAR | Status: DC | PRN
  Administered 2014-06-24: 100 ug via INTRAVENOUS
  Administered 2014-06-24: 50 ug via INTRAVENOUS

## 2014-06-24 MED ORDER — ONDANSETRON HCL 4 MG/2ML IJ SOLN
INTRAMUSCULAR | Status: AC
  Filled 2014-06-24: qty 2

## 2014-06-24 MED ORDER — LIDOCAINE-PRILOCAINE 2.5-2.5 % EX CREA: 1.0000 "application " | TOPICAL_CREAM | CUTANEOUS | Status: DC | PRN

## 2014-06-24 MED ORDER — EPHEDRINE SULFATE 50 MG/ML IJ SOLN
INTRAMUSCULAR | Status: DC | PRN
  Administered 2014-06-24: 10 mg via INTRAVENOUS

## 2014-06-24 MED ORDER — ONDANSETRON HCL 4 MG/2ML IJ SOLN
INTRAMUSCULAR | Status: DC | PRN
  Administered 2014-06-24: 4 mg via INTRAVENOUS

## 2014-06-24 MED ORDER — SODIUM CHLORIDE 0.9 % IJ SOLN: 3.0000 mL | Freq: Two times a day (BID) | INTRAMUSCULAR | Status: DC

## 2014-06-24 MED ORDER — SODIUM CHLORIDE 0.9 % IJ SOLN: 3.0000 mL | INTRAMUSCULAR | Status: DC | PRN

## 2014-06-24 MED ORDER — FENTANYL CITRATE 0.05 MG/ML IJ SOLN
INTRAMUSCULAR | Status: AC
  Filled 2014-06-24: qty 2

## 2014-06-24 MED ORDER — PROPOFOL 10 MG/ML IV BOLUS
INTRAVENOUS | Status: AC
  Filled 2014-06-24: qty 20

## 2014-06-24 MED ORDER — MIDAZOLAM HCL 5 MG/5ML IJ SOLN
INTRAMUSCULAR | Status: DC | PRN
  Administered 2014-06-24: 2 mg via INTRAVENOUS

## 2014-06-24 MED ORDER — CEFAZOLIN SODIUM-DEXTROSE 2-3 GM-% IV SOLR
INTRAVENOUS | Status: AC
  Filled 2014-06-24: qty 50

## 2014-06-24 MED ORDER — HEPARIN SOD (PORK) LOCK FLUSH 100 UNIT/ML IV SOLN
INTRAVENOUS | Status: AC
  Filled 2014-06-24: qty 5

## 2014-06-24 MED ORDER — BUPIVACAINE HCL (PF) 0.25 % IJ SOLN
INTRAMUSCULAR | Status: AC
  Filled 2014-06-24: qty 30

## 2014-06-24 MED ORDER — ACETAMINOPHEN 650 MG RE SUPP: 650.0000 mg | RECTAL | Status: DC | PRN

## 2014-06-24 MED ORDER — FENTANYL CITRATE 0.05 MG/ML IJ SOLN
25.0000 ug | INTRAMUSCULAR | Status: DC | PRN
  Administered 2014-06-24 (×2): 50 ug via INTRAVENOUS

## 2014-06-24 MED ORDER — OXYCODONE HCL 5 MG PO TABS
5.0000 mg | ORAL_TABLET | ORAL | Status: DC | PRN
  Administered 2014-06-24: 5 mg via ORAL
  Filled 2014-06-24: qty 1

## 2014-06-24 MED ORDER — LACTATED RINGERS IV SOLN
INTRAVENOUS | Status: DC
  Administered 2014-06-24: 1000 mL via INTRAVENOUS

## 2014-06-24 SURGICAL SUPPLY — 29 items
BAG DECANTER FOR FLEXI CONT (MISCELLANEOUS) ×2 IMPLANT
BLADE SURG SZ11 CARB STEEL (BLADE) ×2 IMPLANT
CHLORAPREP W/TINT 26ML (MISCELLANEOUS) ×2 IMPLANT
DECANTER SPIKE VIAL GLASS SM (MISCELLANEOUS) ×2 IMPLANT
DRAPE C-ARM 42X120 X-RAY (DRAPES) ×2 IMPLANT
DRAPE LAPAROTOMY TRNSV 102X78 (DRAPE) ×2 IMPLANT
ELECT REM PT RETURN 9FT ADLT (ELECTROSURGICAL) ×2
ELECTRODE REM PT RTRN 9FT ADLT (ELECTROSURGICAL) ×1 IMPLANT
GAUZE SPONGE 4X4 16PLY XRAY LF (GAUZE/BANDAGES/DRESSINGS) ×2 IMPLANT
GLOVE BIO SURGEON STRL SZ 6.5 (GLOVE) ×2 IMPLANT
GLOVE BIOGEL PI IND STRL 7.0 (GLOVE) ×2 IMPLANT
GLOVE BIOGEL PI INDICATOR 7.0 (GLOVE) ×2
GOWN STRL REUS W/TWL 2XL LVL3 (GOWN DISPOSABLE) ×4 IMPLANT
GOWN STRL REUS W/TWL XL LVL3 (GOWN DISPOSABLE) ×2 IMPLANT
KIT BASIN OR (CUSTOM PROCEDURE TRAY) ×2 IMPLANT
KIT PORT POWER 8FR ISP CVUE (Catheter) IMPLANT
KIT POWER CATH 8FR (Catheter) ×2 IMPLANT
LIQUID BAND (GAUZE/BANDAGES/DRESSINGS) ×2 IMPLANT
NEEDLE HYPO 25X1 1.5 SAFETY (NEEDLE) ×2 IMPLANT
PACK BASIC VI WITH GOWN DISP (CUSTOM PROCEDURE TRAY) ×2 IMPLANT
PENCIL BUTTON HOLSTER BLD 10FT (ELECTRODE) ×2 IMPLANT
SUT PROLENE 2 0 SH DA (SUTURE) ×2 IMPLANT
SUT VIC AB 3-0 SH 27 (SUTURE) ×1
SUT VIC AB 3-0 SH 27XBRD (SUTURE) ×1 IMPLANT
SUT VIC AB 4-0 PS2 18 (SUTURE) ×2 IMPLANT
SYR CONTROL 10ML LL (SYRINGE) ×2 IMPLANT
SYRINGE 10CC LL (SYRINGE) ×2 IMPLANT
TOWEL OR 17X26 10 PK STRL BLUE (TOWEL DISPOSABLE) ×2 IMPLANT
TOWEL OR NON WOVEN STRL DISP B (DISPOSABLE) ×2 IMPLANT

## 2014-06-24 NOTE — Anesthesia Preprocedure Evaluation (Addendum)
Anesthesia Evaluation  Patient identified by MRN, date of birth, ID band Patient awake    Reviewed: Allergy & Precautions, H&P , NPO status , Patient's Chart, lab work & pertinent test results, reviewed documented beta blocker date and time   History of Anesthesia Complications (+) PONV  Airway Mallampati: II   Neck ROM: Full    Dental  (+) Teeth Intact   Pulmonary  breath sounds clear to auscultation        Cardiovascular hypertension, Pt. on medications Rhythm:Regular     Neuro/Psych    GI/Hepatic   Endo/Other  diabetes, Poorly Controlled  Renal/GU      Musculoskeletal   Abdominal (+)  Abdomen: soft.    Peds  Hematology   Anesthesia Other Findings   Reproductive/Obstetrics                           Anesthesia Physical Anesthesia Plan  ASA: III  Anesthesia Plan: General   Post-op Pain Management:    Induction: Intravenous  Airway Management Planned: LMA  Additional Equipment:   Intra-op Plan:   Post-operative Plan: Extubation in OR  Informed Consent: I have reviewed the patients History and Physical, chart, labs and discussed the procedure including the risks, benefits and alternatives for the proposed anesthesia with the patient or authorized representative who has indicated his/her understanding and acceptance.     Plan Discussed with:   Anesthesia Plan Comments:         Anesthesia Quick Evaluation

## 2014-06-24 NOTE — Op Note (Signed)
06/24/2014  10:05 AM  PATIENT:  Julian Palmer  53 y.o. male  Patient Care Team: [name], MD as PCP - General (Infectious Diseases) Robert Bellow, MD (General Surgery) Leighton Ruff, MD as Consulting Physician (General Surgery) Irene Limbo, MD as Consulting Physician (Plastic Surgery) Truitt Merle, MD as Consulting Physician (Hematology) Festus Aloe, MD as Consulting Physician (Urology)  PRE-OPERATIVE DIAGNOSIS:  Recurrent anal cancer  POST-OPERATIVE DIAGNOSIS:  Recurrent anal cancer  PROCEDURE:  INSERTION PORT-A-CATH LEFT SUBCLAVIAN   Surgeon(s): Leighton Ruff, MD  ANESTHESIA:   local and general  EBL: min Total I/O In: 600 [I.V.:600] Out: -   DISPOSITION OF SPECIMEN:  N/A  COUNTS:  YES  PLAN OF CARE: Discharge to home after PACU  PATIENT DISPOSITION:  PACU - hemodynamically stable.  INDICATION: Patient with need for IV chemotherapy for recurrent anal cancer. Port-A-Cath placement was requested.   Use of a central venous catheter for intravenous therapy was discussed. Technique of catheter placement using ultrasound and fluoroscopy guidance was discussed. Risks such as bleeding, infection, pneumothorax, catheter occlusion, reoperation, and other risks were discussed. I noted a good likelihood this will help address the problem. Questions were answered. The patient expressed understanding & wishes to proceed.   Findings: Normal-appearing anatomy.   8 [place] power port. It goes through the left subclavian vein   Procedure: Informed consent was confirmed. Patient was brought the operating room and positioned supine. Arms were tucked. The patient underwent deep sedation. Neck and chest were clipped and prepped and draped in a sterile fashion. A surgical timeout confirmed our plan. I placed a field block of local anesthesia on the chest  I entered into the left subclavian on the first venipuncture using standard landmarks. Non-pulsatile blood was  returned. Wire was easily passed into the inferior vena cava and confirmed by fluoroscopy. I confirmed placement of the wire in the right side of the chest.  I made an incision in the lateral infraclavicular area and made a subcutaneous pocket. I used a dilator on the wire using Seldinger technique to dilate the tract under fluoroscopy. I placed the catheter into the sheath. I then peeled away the dilator sheath. I tunneled the power port from the puncture site to the chest pocket. I cut the catheter to appropriate length and attached it to the port using the plastic connector. The port was placed into the pocket and secured to the left anterior chest wall using 2-0 Prolene interrupted stitches x2. Catheter flushed well.  Fluoroscopy confirmed the tip in the distal SVC. Catheter aspirated and flushed well. On final fluoro reevaluation the tip seen to be in good position in the distal SVC ~2cm below the carina.  I closed the wounds using 3-0 Vicryl interrupted sutures for the pocket and 4-0 Monocryl stitch was used to close the skin. Dermabond was used on the 2 incisions. CXR will be performed in PACU. Patient should go home later today. Catheter is okay to use.

## 2014-06-24 NOTE — Anesthesia Postprocedure Evaluation (Signed)
2 Anesthesia Post-op Note  Patient: Julian Palmer  Procedure(s) Performed: Procedure(s): INSERTION PORT-A-CATH LEFT SUBCLAVIAN (Left)  Patient Location: PACU  Anesthesia Type:General  Level of Consciousness: awake and alert   Airway and Oxygen Therapy: Patient Spontanous Breathing  Post-op Pain: mild  Post-op Assessment: Post-op Vital signs reviewed, Patient's Cardiovascular Status Stable and Respiratory Function Stable  Post-op Vital Signs: Reviewed and stable  Last Vitals:  Filed Vitals:   06/24/14 0709  BP: 128/75  Pulse: 80  Temp: 36.8 C  Resp: 20    Complications: No apparent anesthesia complications

## 2014-06-24 NOTE — Progress Notes (Signed)
Dr. Tresa Moore made aware of patient's CBG results in PACU-213.

## 2014-06-24 NOTE — Telephone Encounter (Signed)
per desk nurse rearranged pt tx as 24fu should be day 1 on mondays and cisplatin day 2 on tuesdays w/pump d/c on saturdays. also added ched class. per myrtle she will contact pt. message to inf managers and chemo scheduler re add on for 06/28/14.

## 2014-06-24 NOTE — Transfer of Care (Signed)
Immediate Anesthesia Transfer of Care Note  Patient: Julian Palmer  Procedure(s) Performed: Procedure(s): INSERTION PORT-A-CATH LEFT SUBCLAVIAN (Left)  Patient Location: PACU  Anesthesia Type:General  Level of Consciousness: awake, alert , oriented and patient cooperative  Airway & Oxygen Therapy: Patient Spontanous Breathing and Patient connected to face mask oxygen  Post-op Assessment: Report given to PACU RN and Post -op Vital signs reviewed and stable  Post vital signs: Reviewed and stable  Complications: No apparent anesthesia complications

## 2014-06-24 NOTE — Progress Notes (Signed)
Portable chest xray done.

## 2014-06-24 NOTE — Progress Notes (Signed)
X-ray results noted 

## 2014-06-24 NOTE — Progress Notes (Signed)
PAC booklet given to patient

## 2014-06-24 NOTE — H&P (Signed)
HISTORY:  Julian Palmer is a 53 y.o. male with recurrent anal cancer. This was found on biopsy. Workup thus far has included PET scan, which was neg for metastatic disease. 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 He developed anal cancer about 4 years ago and was treated with chemoRT with a complete response.  Per pt, his HIV is well controlled and undetectable. 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 He is now s/p APR.   Past Medical History   Diagnosis  Date   .  CAD (coronary artery disease)    .  HTN (hypertension)    .  HLD (hyperlipidemia)    .  S/P angioplasty      stent to his proximal LAD in 1998 angioplasty to the mdi to distal LAD   .  Carcinoma in situ of anal canal  2010   .  Diabetes mellitus      Non insulin Dependent   .  Cancer  October 06, 2010     invasive squamous cell carcinoma, poorly differentiated, status post radiation/chemotherapy. 9 o'clock position.    Past Surgical History   Procedure  Laterality  Date   .  Stent (other)     .  Colonoscopy   2009   .  Anal polyp excision   2009   .  Hernia repair   1988   .  Septoplasty   1985   .  Excision anal mass   2012   .  Angioplasty   1998   .  Sphincterotomy   2009   .  Anal skin squamous cell reexcision   2010     radiation and chemotherapy   .  Insertion central venous access device w/ subcutaneous port     .  Rectal mass removal   01/08/14    Current Outpatient Prescriptions   Medication  Sig  Dispense  Refill   .  aspirin (ASPIR-81) 81 MG EC tablet  Take 81 mg by mouth daily. Take 2 tabs     .  atorvastatin (LIPITOR) 80 MG tablet  Take 1 tablet (80 mg total) by mouth daily.  90 tablet  3   .  citalopram (CELEXA) 20 MG tablet  Take 1 tablet (20 mg total) by mouth daily.  30 tablet  5   .  efavirenz-emtrictabine-tenofovir (ATRIPLA) 211-941-740 MG per tablet  Take 1 tablet by  mouth daily.     Marland Kitchen  glimepiride (AMARYL) 2 MG tablet  Take 8 mg tablets daily.     Marland Kitchen  losartan (COZAAR) 100 MG tablet  Take 1 tablet (100 mg total) by mouth daily.  90 tablet  4   .  metFORMIN (GLUCOPHAGE) 1000 MG tablet  Take 1,000 mg by mouth 2 (two) times daily.     .  Multiple Vitamins-Minerals (MULTIVITAL) tablet  Take 1 tablet by mouth daily.     .  niacin (NIASPAN) 1000 MG CR tablet  Take 1 tablet (1,000 mg total) by mouth at bedtime. 30 minutes after aspirin  90 tablet  3   .  sildenafil (VIAGRA) 100 MG tablet  Take 1 tablet (100 mg total) by mouth daily as needed for erectile dysfunction.  3 tablet  10   .  testosterone cypionate (DEPOTESTOTERONE CYPIONATE) 200 MG/ML injection  Inject 240 mg into the muscle.     .  testosterone enanthate (DELATESTRYL) 200 MG/ML injection  Inject 50 mg into  the muscle every 14 (fourteen) days. For IM use only     .  zolpidem (AMBIEN) 10 MG tablet  Take 1 tablet (10 mg total) by mouth at bedtime as needed for sleep.  30 tablet  1    No current facility-administered medications for this visit.    Allergies   Allergen  Reactions   .  Sulfa Antibiotics  Hives   .  Sulfonamide Derivatives     Family History   Problem  Relation  Age of Onset   .  Diabetes type II  Mother    .  Breast cancer  Mother      survivor   .  Hypertension  Mother    .  Coronary artery disease  Father     History    Social History   .  Marital Status:  Single     Spouse Name:  N/A     Number of Children:  N/A   .  Years of Education:  N/A    Occupational History   .   [place] History Main Topics   .  Smoking status:  Never Smoker   .  Smokeless tobacco:  None      Comment: tobacco use- no   .  Alcohol Use:  No      Comment: occas.   .  Drug Use:  No   .  Sexual Activity:  None    Other Topics  Concern    .  None    Social History Narrative    Full time. Single. Does not regularly exercise.   REVIEW OF SYSTEMS - PERTINENT POSITIVES ONLY:  Review of Systems - General ROS: negative for - chills, fever or pos for weight loss  Respiratory ROS: no cough, shortness of breath, or wheezing  Cardiovascular ROS: no chest pain or dyspnea on exertion  Gastrointestinal ROS: no abdominal pain, change in bowel habits, or black or bloody stools  Genito-Urinary ROS: no dysuria, trouble voiding, or hematuria  Musculoskeletal ROS: negative for - joint pain or joint swelling  EXAM:  BP 128/75 mmHg  Pulse 80  Temp(Src) 98.2 F (36.8 C) (Oral)  Resp 20  Ht 5\' 11"  (1.803 m)  Wt 192 lb 6.4 oz (87.272 kg)  BMI 26.85 kg/m2  SpO2 99%  Gen: No acute distress. Well nourished and well groomed.  Neurological: Alert and oriented to person, place, and time. Coordination normal.  Head: Normocephalic and atraumatic.  Eyes: Conjunctivae are normal. Pupils are equal, round, and reactive to light. No scleral icterus.  Neck: Normal range of motion. Neck supple. No tracheal deviation or thyromegaly present. No cervical lymphadenopathy.  Cardiovascular: Normal rate, regular rhythm, normal heart sounds and intact distal pulses.  Respiratory: Effort normal. No respiratory distress. No chest wall tenderness. Breath sounds normal. No wheezes, rales or rhonchi.  GI: Soft. Bowel sounds are normal. The abdomen is soft and nontender. There is no rebound and no guarding.  Musculoskeletal: Normal range of motion. Extremities are nontender.  Skin: Skin is warm and dry. No rash noted. No diaphoresis. No erythema. No pallor. No clubbing, cyanosis, or edema.  Psychiatric: Normal mood and affect. Behavior is normal. Judgment and thought content normal.   Assessment: Recurrent anal cancer, unfavorable pathology with high chance of progression of disease.   He has discussed with Dr Burr Medico, and they have decided to  proceed with chemotherapy adjuvantly.   We will place a port today to assist with  this.  He understands the risks of the surgery, which include bleeding, infection, pneumothorax and malfunction of device.

## 2014-06-24 NOTE — Telephone Encounter (Signed)
Received vm call from pt stating that he had questions about his schedule. Returned calll & appts were incorrect.  Corrected appts with scheduler & notified pt.  Added chemo class.  Pt not happy with this & doesn't think he needs the class; he has had chemo before in [place] with 50fu pump but has never gone to a chemo class.  Encouraged pt to attend & note left for Ed RN to discuss.  Pt given new appt times & EMLA script sent to pharmacy & pt given instructions on how to use.

## 2014-06-24 NOTE — Discharge Instructions (Signed)
    PORT-A-CATH: POST OP INSTRUCTIONS  Always review your discharge instruction sheet given to you by the facility where your surgery was performed.   1. A prescription for pain medication may be given to you upon discharge. Take your pain medication as prescribed, if needed. If narcotic pain medicine is not needed, then you make take acetaminophen (Tylenol) or ibuprofen (Advil) as needed.  2. Take your usually prescribed medications unless otherwise directed. 3. If you need a refill on your pain medication, please contact our office. All narcotic pain medicine now requires a paper prescription.  Phoned in and fax refills are no longer allowed by law.  Prescriptions will not be filled after 5 pm or on weekends.  4. You should follow a light diet for the remainder of the day after your procedure. 5. Most patients will experience some mild swelling and/or bruising in the area of the incision. It may take several days to resolve. 6. It is common to experience some constipation if taking pain medication after surgery. Increasing fluid intake and taking a stool softener (such as Colace) will usually help or prevent this problem from occurring. A mild laxative (Milk of Magnesia or Miralax) should be taken according to package directions if there are no bowel movements after 48 hours.  7. Unless discharge instructions indicate otherwise, you may remove your bandages 48 hours after surgery, and you may shower at that time. You may have steri-strips (small white skin tapes) in place directly over the incision.  These strips should be left on the skin for 7-10 days.  If your surgeon used Dermabond (skin glue) on the incision, you may shower in 24 hours.  The glue will flake off over the next 2-3 weeks.  8. If your port is left accessed at the end of surgery (needle left in port), the dressing cannot get wet and should only by changed by a healthcare professional. When the port is no longer accessed (when the  needle has been removed), follow step 7.   9. ACTIVITIES:  Limit activity involving your arms for the next 72 hours. Do no strenuous exercise or activity for 1 week. You may drive when you are no longer taking prescription pain medication, you can comfortably wear a seatbelt, and you can maneuver your car. 10.You may need to see your doctor in the office for a follow-up appointment.  Please       check with your doctor.  11.When you receive a new Port-a-Cath, you will get a product guide and        ID card.  Please keep them in case you need them.  WHEN TO CALL YOUR DOCTOR ([phone]): 1. Fever over 101.0 2. Chills 3. Continued bleeding from incision 4. Increased redness and tenderness at the site 5. Shortness of breath, difficulty breathing   The clinic staff is available to answer your questions during regular business hours. Please don't hesitate to call and ask to speak to one of the nurses or medical assistants for clinical concerns. If you have a medical emergency, go to the nearest emergency room or call 911.  A surgeon from [facility] is always on call at the hospital.     For further information, please visit [email or web address]      

## 2014-06-28 ENCOUNTER — Other Ambulatory Visit: Payer: Private Health Insurance - Indemnity

## 2014-06-28 ENCOUNTER — Encounter (HOSPITAL_COMMUNITY): Payer: Self-pay | Admitting: General Surgery

## 2014-06-28 ENCOUNTER — Ambulatory Visit (HOSPITAL_BASED_OUTPATIENT_CLINIC_OR_DEPARTMENT_OTHER): Payer: Private Health Insurance - Indemnity

## 2014-06-28 DIAGNOSIS — Z5111 Encounter for antineoplastic chemotherapy: Secondary | ICD-10-CM

## 2014-06-28 DIAGNOSIS — C211 Malignant neoplasm of anal canal: Secondary | ICD-10-CM

## 2014-06-28 DIAGNOSIS — C210 Malignant neoplasm of anus, unspecified: Secondary | ICD-10-CM

## 2014-06-28 MED ORDER — SODIUM CHLORIDE 0.9 % IV SOLN
1000.0000 mg/m2/d | INTRAVENOUS | Status: DC
  Administered 2014-06-28: 10550 mg via INTRAVENOUS
  Filled 2014-06-28: qty 211

## 2014-06-29 ENCOUNTER — Ambulatory Visit (HOSPITAL_BASED_OUTPATIENT_CLINIC_OR_DEPARTMENT_OTHER): Payer: Private Health Insurance - Indemnity

## 2014-06-29 ENCOUNTER — Encounter: Payer: Self-pay | Admitting: *Deleted

## 2014-06-29 DIAGNOSIS — C210 Malignant neoplasm of anus, unspecified: Secondary | ICD-10-CM

## 2014-06-29 DIAGNOSIS — Z5111 Encounter for antineoplastic chemotherapy: Secondary | ICD-10-CM

## 2014-06-29 DIAGNOSIS — C211 Malignant neoplasm of anal canal: Secondary | ICD-10-CM

## 2014-06-29 MED ORDER — SODIUM CHLORIDE 0.9 % IV SOLN
150.0000 mg | Freq: Once | INTRAVENOUS | Status: AC
  Administered 2014-06-29: 150 mg via INTRAVENOUS
  Filled 2014-06-29: qty 5

## 2014-06-29 MED ORDER — DEXAMETHASONE SODIUM PHOSPHATE 20 MG/5ML IJ SOLN
INTRAMUSCULAR | Status: AC
  Filled 2014-06-29: qty 5

## 2014-06-29 MED ORDER — POTASSIUM CHLORIDE 2 MEQ/ML IV SOLN
Freq: Once | INTRAVENOUS | Status: AC
  Administered 2014-06-29: 11:00:00 via INTRAVENOUS
  Filled 2014-06-29: qty 10

## 2014-06-29 MED ORDER — PALONOSETRON HCL INJECTION 0.25 MG/5ML
INTRAVENOUS | Status: AC
  Filled 2014-06-29: qty 5

## 2014-06-29 MED ORDER — SODIUM CHLORIDE 0.9 % IV SOLN
100.0000 mg/m2 | Freq: Once | INTRAVENOUS | Status: AC
  Administered 2014-06-29: 211 mg via INTRAVENOUS
  Filled 2014-06-29: qty 211

## 2014-06-29 MED ORDER — SODIUM CHLORIDE 0.9 % IV SOLN
Freq: Once | INTRAVENOUS | Status: AC
  Administered 2014-06-29: 10:00:00 via INTRAVENOUS

## 2014-06-29 MED ORDER — DEXAMETHASONE SODIUM PHOSPHATE 20 MG/5ML IJ SOLN
12.0000 mg | Freq: Once | INTRAMUSCULAR | Status: AC
  Administered 2014-06-29: 12 mg via INTRAVENOUS

## 2014-06-29 MED ORDER — PALONOSETRON HCL INJECTION 0.25 MG/5ML
0.2500 mg | Freq: Once | INTRAVENOUS | Status: AC
  Administered 2014-06-29: 0.25 mg via INTRAVENOUS

## 2014-06-29 NOTE — Patient Instructions (Signed)
[facility] Discharge Instructions for Patients Receiving Chemotherapy  Today you received the following chemotherapy agents Cisplatin  To help prevent nausea and vomiting after your treatment, we encourage you to take your nausea medication as needed   If you develop nausea and vomiting that is not controlled by your nausea medication, call the clinic.   BELOW ARE SYMPTOMS THAT SHOULD BE REPORTED IMMEDIATELY:  *FEVER GREATER THAN 100.5 F  *CHILLS WITH OR WITHOUT FEVER  NAUSEA AND VOMITING THAT IS NOT CONTROLLED WITH YOUR NAUSEA MEDICATION  *UNUSUAL SHORTNESS OF BREATH  *UNUSUAL BRUISING OR BLEEDING  TENDERNESS IN MOUTH AND THROAT WITH OR WITHOUT PRESENCE OF ULCERS  *URINARY PROBLEMS  *BOWEL PROBLEMS  UNUSUAL RASH Items with * indicate a potential emergency and should be followed up as soon as possible.  Feel free to call the clinic you have any questions or concerns. The clinic phone number is [phone].    

## 2014-06-29 NOTE — Progress Notes (Signed)
[place] Work  Clinical Social Work was referred by Therapist, sports for emotional support and assessment of psychosocial needs.  Clinical Social Worker met with patient in the infusion room at [facility] to offer support and assess for needs.  CSW and patient discussed patients treatment journey as he expressed feeling overwhelmed with the number of medical procedures and appointments.  CSW validated patients feelings and normalized common emotional reactions to being diagnosed with cancer.  CSW and patient discussed the support team and support services at [facility].  Patient expressed interest in the GI support group and Ostomy support group.  Patient stated he was currently being followed by a psychiatrist and psychologist in the community and planned to schedule an appointment with both providers once he completed the many treatment appointments and follow ups.  Patient also reported he recently moved back into his home after staying with his sister for over a month recovering from surgery.  Patient stated moving back to his home environment was helpful and allowed him to regain some of his independence.  CSW and patient discussed the importance of emotional support and self care during treatment.  CSW provided contact information and encouraged patient to call with needs or concerns.              Clinical Social Work interventions: Resources education Emotional support  Supportive Listening   Julian Palmer, MSW, LCSW, OSW-C Clinical Social Worker [place] [phone]

## 2014-06-30 ENCOUNTER — Telehealth: Payer: Self-pay | Admitting: *Deleted

## 2014-06-30 ENCOUNTER — Ambulatory Visit: Payer: Private Health Insurance - Indemnity

## 2014-06-30 ENCOUNTER — Other Ambulatory Visit: Payer: Self-pay | Admitting: *Deleted

## 2014-06-30 DIAGNOSIS — R112 Nausea with vomiting, unspecified: Secondary | ICD-10-CM

## 2014-06-30 MED ORDER — PROCHLORPERAZINE MALEATE 10 MG PO TABS: 10.0000 mg | ORAL_TABLET | Freq: Four times a day (QID) | ORAL | Status: DC | PRN

## 2014-06-30 NOTE — Telephone Encounter (Signed)
-----   Message from Patton Salles, RN sent at 06/29/2014  1:41 PM EST ----- Regarding: Chemo follow up 1st Cisplatin on 1/5.   Had 5 FU pump connected on 1/4-to 1/9. Very anxious.   Thanks,Tammi

## 2014-06-30 NOTE — Telephone Encounter (Signed)
Called and spoke with patient after receiving cisplatin yesterday. He states he is nauseated but has not thrown up. Denies any other issues. He states he has been taking zofran for the nausea but can't notice any difference. According to patient' chart he had aloxi yesterday. Notified patient that since he had aloxi, the zofran will not be effective for 72 hours. Told him I will speak with Dr. Burr Medico and see if we can get him something else for nausea. Message passed along to [place], [place] and she will talk with Dr. Burr Medico and notify patient if script is sent to his pharmacy.

## 2014-06-30 NOTE — Telephone Encounter (Signed)
Notified pt that nausea med sent to his pharmacy & encouraged pt to take as soon as he could get it & keep diet light, maybe clear liq for a while until nausea better.  He can go back to the zofran later.  Pt expressed understanding & knows to call back if no better.

## 2014-07-02 MED ORDER — HYDROCORTISONE 2 % EX LOTN: 1.0000 "application " | TOPICAL_LOTION | Freq: Two times a day (BID) | CUTANEOUS | Status: DC

## 2014-07-02 NOTE — Addendum Note (Signed)
Addended by: Truitt Merle on: 07/02/2014 08:14 AM   Modules accepted: Orders

## 2014-07-03 ENCOUNTER — Ambulatory Visit (HOSPITAL_BASED_OUTPATIENT_CLINIC_OR_DEPARTMENT_OTHER): Payer: Private Health Insurance - Indemnity

## 2014-07-03 DIAGNOSIS — C210 Malignant neoplasm of anus, unspecified: Secondary | ICD-10-CM

## 2014-07-03 DIAGNOSIS — Z5189 Encounter for other specified aftercare: Secondary | ICD-10-CM

## 2014-07-03 DIAGNOSIS — C211 Malignant neoplasm of anal canal: Secondary | ICD-10-CM

## 2014-07-03 MED ORDER — HEPARIN SOD (PORK) LOCK FLUSH 100 UNIT/ML IV SOLN
500.0000 [IU] | Freq: Once | INTRAVENOUS | Status: AC | PRN
  Administered 2014-07-03: 500 [IU]
  Filled 2014-07-03: qty 5

## 2014-07-03 MED ORDER — SODIUM CHLORIDE 0.9 % IJ SOLN
10.0000 mL | INTRAMUSCULAR | Status: DC | PRN
  Administered 2014-07-03: 10 mL
  Filled 2014-07-03: qty 10

## 2014-07-03 NOTE — Patient Instructions (Signed)

## 2014-07-06 ENCOUNTER — Telehealth: Payer: Self-pay | Admitting: *Deleted

## 2014-07-06 NOTE — Telephone Encounter (Signed)
Pt called and left message wanting to talk to nurse.  Spoke with pt and was informed that pt has had diarrhea off and on for past 3 days.  More diarrhea last night.  Pt took only 2 generic Imodium about 2 am today.  Pt was not sure how much he could take.  Went over instructions on how to take Imodium with pt.  Pt stated he has been trying to drink po fluids as tolerated.  Offered pt to come in today to be evaluated by NP with symptoms management.  Pt refused - stating "  I am tired of coming to the hospital every day.  I just want to handle this myself ".  Instructed pt to take another Imodium tablet now.  Pt stated he gets ready to eat rice, and toast ( BRAT diet ). Pt has appt with Dr. Burr Medico on 07/08/14.   Reinforced with pt that he might need IVF to prevent dehydration from diarrhea after being evaluated by symptoms management provider.  Again,  Pt refused and stated  NO.   Message to Dr. Burr Medico. Pt's  Phone    [phone].

## 2014-07-08 ENCOUNTER — Other Ambulatory Visit: Payer: Self-pay | Admitting: *Deleted

## 2014-07-08 ENCOUNTER — Ambulatory Visit (HOSPITAL_BASED_OUTPATIENT_CLINIC_OR_DEPARTMENT_OTHER): Payer: Private Health Insurance - Indemnity | Admitting: Hematology

## 2014-07-08 ENCOUNTER — Other Ambulatory Visit (HOSPITAL_BASED_OUTPATIENT_CLINIC_OR_DEPARTMENT_OTHER): Payer: Private Health Insurance - Indemnity

## 2014-07-08 ENCOUNTER — Telehealth: Payer: Self-pay | Admitting: Hematology

## 2014-07-08 VITALS — BP 135/80 | HR 101 | Temp 98.0°F | Resp 20 | Ht 71.0 in | Wt 189.2 lb

## 2014-07-08 DIAGNOSIS — E119 Type 2 diabetes mellitus without complications: Secondary | ICD-10-CM

## 2014-07-08 DIAGNOSIS — B20 Human immunodeficiency virus [HIV] disease: Secondary | ICD-10-CM

## 2014-07-08 DIAGNOSIS — C210 Malignant neoplasm of anus, unspecified: Secondary | ICD-10-CM

## 2014-07-08 DIAGNOSIS — C211 Malignant neoplasm of anal canal: Secondary | ICD-10-CM

## 2014-07-08 DIAGNOSIS — I2510 Atherosclerotic heart disease of native coronary artery without angina pectoris: Secondary | ICD-10-CM

## 2014-07-08 DIAGNOSIS — I10 Essential (primary) hypertension: Secondary | ICD-10-CM

## 2014-07-08 LAB — COMPREHENSIVE METABOLIC PANEL (CC13)
ALK PHOS: 103 U/L (ref 40–150)
ALT: 30 U/L (ref 0–55)
AST: 22 U/L (ref 5–34)
Albumin: 3.7 g/dL (ref 3.5–5.0)
Anion Gap: 10 mEq/L (ref 3–11)
BUN: 18.3 mg/dL (ref 7.0–26.0)
CO2: 24 mEq/L (ref 22–29)
CREATININE: 1.2 mg/dL (ref 0.7–1.3)
Calcium: 8.6 mg/dL (ref 8.4–10.4)
Chloride: 99 mEq/L (ref 98–109)
EGFR: 67 mL/min/{1.73_m2} — ABNORMAL LOW (ref >90)
GLUCOSE: 177 mg/dL — AB (ref 70–140)
Potassium: 4.2 mEq/L (ref 3.5–5.1)
Sodium: 133 mEq/L — ABNORMAL LOW (ref 136–145)
Total Bilirubin: <0.2 mg/dL (ref 0.20–1.20)
Total Protein: 8.2 g/dL (ref 6.4–8.3)

## 2014-07-08 LAB — CBC WITH DIFFERENTIAL/PLATELET
BASO%: 0.7 % (ref 0.0–2.0)
BASOS ABS: 0.1 10*3/uL (ref 0.0–0.1)
EOS%: 2.3 % (ref 0.0–7.0)
Eosinophils Absolute: 0.2 10*3/uL (ref 0.0–0.5)
HCT: 37.1 % — ABNORMAL LOW (ref 38.4–49.9)
HEMOGLOBIN: 12.5 g/dL — AB (ref 13.0–17.1)
LYMPH%: 28.2 % (ref 14.0–49.0)
MCH: 28.7 pg (ref 27.2–33.4)
MCHC: 33.7 g/dL (ref 32.0–36.0)
MCV: 85.3 fL (ref 79.3–98.0)
MONO#: 0.4 10*3/uL (ref 0.1–0.9)
MONO%: 5.7 % (ref 0.0–14.0)
NEUT#: 4.4 10*3/uL (ref 1.5–6.5)
NEUT%: 63.1 % (ref 39.0–75.0)
Platelets: 235 10*3/uL (ref 140–400)
RBC: 4.35 10*6/uL (ref 4.20–5.82)
RDW: 13 % (ref 11.0–14.6)
WBC: 7 10*3/uL (ref 4.0–10.3)
lymph#: 2 10*3/uL (ref 0.9–3.3)

## 2014-07-08 LAB — MAGNESIUM (CC13): Magnesium: 1.7 mg/dl (ref 1.5–2.5)

## 2014-07-08 MED ORDER — MAGIC MOUTHWASH: 5.0000 mL | Freq: Four times a day (QID) | ORAL | Status: DC | PRN

## 2014-07-08 NOTE — Telephone Encounter (Signed)
Gave avs & cal for Feb. °

## 2014-07-11 ENCOUNTER — Encounter: Payer: Self-pay | Admitting: Hematology

## 2014-07-11 NOTE — Progress Notes (Signed)
[facility] NOTE  Patient Care Team: [name], MD as PCP - General (Infectious Diseases) Robert Bellow, MD (General Surgery) Leighton Ruff, MD as Consulting Physician (General Surgery) Irene Limbo, MD as Consulting Physician (Plastic Surgery) Truitt Merle, MD as Consulting Physician (Hematology) Festus Aloe, MD as Consulting Physician (Urology)  DIAGNOSIS Recurrent anal cancer    Primary cancer of anal canal   02/05/2011 Initial Diagnosis Primary cancer of anal canal, T1N0M0, s/p surgical resection with positive margines. He received definitive dose RT 45 Gy over 5 weeks with 16Gy boosting and concurrent chemo with 5FU, treated at [facility] by Drs.  Geoffreyh and [employer].    11/23/2013 Relapse/Recurrence local recurrence    04/21/2014 Surgery abdominal perianal resection (APR) and VRAM flap closure of perineal wound on 04/21/14, surgical margins were positive for cancer      OTHER RELATED ISSUES: 1. (+) HIV, on therapy with good control  2. CAD, s/p stent placement in 1998  3. Urine leakage after surgery on 04/21/14, has a foley in place now   CURRENT THERAPY: 5-FU 1000mg /m2/day on D1-5, ciplatin 100mg /m2 on D2, every 28 days, started on 06/28/2014  INTERIM HISTORY: Julian Palmer returns for follow up after his first cycle chemo last week. He has been having diarrhea since he started chemo. It was worst a few days ago, he had to empty the colostomy bag every a few hours, his appetite is also down, not eating much, but drinks adequately. He also developed skin rashes on his trunk and extremities, mild itchy, i called in topical steroids, improved, healing now. I asked him come in for IVF a few days ago but he did not agree. Diarrhea improved after imodium.   MEDICAL HISTORY:  Past Medical History  Diagnosis Date  . S/p bare metal coronary artery stent     1998  pLAD  . Type 2 diabetes mellitus   . Hypertension   . 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 Hyperlipidemia   . History of carcinoma in situ of anal canal     2010  . ED (erectile dysfunction)   . Sigmoid diverticulosis   . PONV (postoperative nausea and vomiting)   . Wears glasses   . At risk for sleep apnea     STOP-BANG= 4     SENT TO PCP 03-23-2014  . Coronary artery disease CARDIOLOGIST--  DR Rockey Situ --  LAST LOV 04-17-2013    S/P  PCI to mid and distal LAD and BM stent x1 to pLAD  . Anxiety   . Depression   . HIV positive   . Recurrent squamous cell carcinoma of anus     removal mass 01-08-2014  . Primary cancer of anal canal 05/18/2011    SURGICAL HISTORY: Past Surgical History  Procedure Laterality Date  . Septoplasty  1985  . Sphincterotomy  2009  . Cardiovascular stress test  04/ 2010    low risk study/  very small region of mildly reduced perfersion is mildly reversible dLAD territory  . Resection anal mass  02-05-2011  . Excision anal polyp  2009  . Re-excision anal squamous cell carcinoma  2010  . Removal rectal mass  01-08-2014  . Eua w/ bx perianal skin  06-08-2009  . Port-a-cath placement  02-21-2011    REMOVED  2013  .  Coronary angioplasty with stent placement  1998   [facility]    BM stent to pLAD and PCI to Mid and Distal LAD/  no other significant cad  . Inguinal hernia repair Bilateral 1988  . Colonoscopy with propofol  05-28-2008  . Mass excision N/A 03/26/2014    Procedure: ANAL EXAM UNDER ANESTHESIA,EXCISIONAL BIOPSY OF ANAL MASS;  Surgeon: Leighton Ruff, MD;  Location: [facility];  Service: General;  Laterality: N/A;  . Laparoscopic assisted abdominal perineal resection N/A 04/22/2014    Procedure: LAPAROSCOPIC  ABDOMINAL PERINEAL RESECTION, ;  Surgeon: Leighton Ruff, MD;  Location: [facility];  Service: General;  Laterality: N/A;  . Urethrotomy Bilateral 04/22/2014     Procedure: CYSTOSCOPY/URETHROTOMY;  Surgeon: Leighton Ruff, MD;  Location: [facility];  Service: General;  Laterality: Bilateral;  . Muscle flap closure N/A 04/22/2014    Procedure: VRAM FLAP;  Surgeon: Irene Limbo, MD;  Location: [facility];  Service: Plastics;  Laterality: N/A;  . Portacath placement Left 06/24/2014    Procedure: INSERTION PORT-A-CATH LEFT SUBCLAVIAN;  Surgeon: Leighton Ruff, MD;  Location: [facility];  Service: General;  Laterality: Left;    SOCIAL HISTORY: History   Social History  . Marital Status: Single    Spouse Name: N/A    Number of Children: 0  . Years of Education: N/A   Occupational History  .  [employer] History Main Topics  . Smoking status: Never Smoker   . Smokeless tobacco: Never Used  . Alcohol Use: No     Comment: RARE  . Drug Use: No  . Sexual Activity: Not on file   Other Topics Concern  . Not on file   Social History Narrative   Full time. Single. Does not regularly exercise.     FAMILY HISTORY: Family History  Problem Relation Age of Onset  . Diabetes type II Mother   . Breast cancer Mother 21     survivor  . Hypertension Mother   . Coronary artery disease Father   Paternal GM had utrine cancer, no other history of malignancy   ALLERGIES:  is allergic to sulfa antibiotics.  MEDICATIONS:  Current Outpatient Prescriptions  Medication Sig Dispense Refill  . aspirin (ASPIR-81) 81 MG EC tablet Take 81 mg by mouth at bedtime.     Marland Kitchen atorvastatin (LIPITOR) 80 MG tablet Take 80 mg by mouth every evening.    . citalopram (CELEXA) 40 MG tablet Take 40 mg by mouth every morning.    Marland Kitchen efavirenz-emtrictabine-tenofovir (ATRIPLA) 600-200-300 MG per tablet Take 1 tablet by mouth at bedtime.     Marland Kitchen glimepiride (AMARYL) 2 MG tablet Take 4 mg by mouth 2 (two) times daily.     Marland Kitchen glucose blood ([employer] BLOOD GLUCOSE TEST STRIPS) test strip 1 each by Other route 2 (two) times daily.     Marland Kitchen HYDROCORTISONE, TOPICAL, 2 % LOTN Apply 1  application topically 2 (two) times daily. 1 Bottle 1  . insulin glargine (LANTUS) 100 UNIT/ML injection Inject 0.25 mLs (25 Units total) into the skin at bedtime. (Patient taking differently: Inject 25 Units into the skin at bedtime as needed (blood sugar.). For Levels above 110) 10 mL 2  . Lancet Devices ([employer] LANCING DEVICE) MISC 1 each by Other route 2 (two) times daily. Use 1 each once daily. Use as instructed.    . lidocaine-prilocaine (EMLA) cream Apply 1 application topically as needed. Apply to portacath 1 1/2 hours to 2 hours prior to procedures as  needed. 30 g 1  . losartan (COZAAR) 100 MG tablet Take 100 mg by mouth every evening.    . metFORMIN (GLUCOPHAGE) 1000 MG tablet Take 1,000 mg by mouth 2 (two) times daily.      . Multiple Vitamins-Minerals (MULTIVITAL) tablet Take 1 tablet by mouth every evening.     . niacin (NIASPAN) 1000 MG CR tablet Take 1,000 mg by mouth at bedtime.    . nitrofurantoin (MACRODANTIN) 50 MG capsule Take 50 mg by mouth at bedtime.     . ondansetron (ZOFRAN) 8 MG tablet Take 1 tablet (8 mg total) by mouth every 8 (eight) hours as needed for nausea or vomiting. 60 tablet 3  . oxyCODONE (OXY IR/ROXICODONE) 5 MG immediate release tablet Take 1-3 tablets (5-15 mg total) by mouth every 4 (four) hours as needed for moderate pain, severe pain or breakthrough pain. 30 tablet 0  . prochlorperazine (COMPAZINE) 10 MG tablet Take 1 tablet (10 mg total) by mouth every 6 (six) hours as needed for nausea or vomiting. 30 tablet 0  . traZODone (DESYREL) 100 MG tablet Take 200 mg by mouth at bedtime.    . Alum & Mag Hydroxide-Simeth (MAGIC MOUTHWASH) SOLN Take 5 mLs by mouth 4 (four) times daily as needed for mouth pain (Nystatin, Lidocaine, Maalox, and Benadryl  ingredients.). 240 mL 0  . hyoscyamine (LEVSIN SL) 0.125 MG SL tablet      No current facility-administered medications for this visit.    REVIEW OF SYSTEMS:   Constitutional: Denies fevers, chills or abnormal night  sweats Eyes: Denies blurriness of vision, double vision or watery eyes Ears, nose, mouth, throat, and face: Denies mucositis or sore throat Respiratory: Denies cough, dyspnea or wheezes Cardiovascular: Denies palpitation, chest discomfort or lower extremity swelling Gastrointestinal:  Denies nausea, heartburn or change in bowel habits Skin: Denies abnormal skin rashes Lymphatics: Denies new lymphadenopathy or easy bruising Neurological:Denies numbness, tingling or new weaknesses Behavioral/Psych: Mood is stable, no new changes  All other systems were reviewed with the patient and are negative.  PHYSICAL EXAMINATION: ECOG PERFORMANCE STATUS: 1 - Symptomatic but completely ambulatory  Filed Vitals:   07/08/14 1211  BP: 135/80  Pulse: 101  Temp: 98 F (36.7 C)  Resp: 20   Filed Weights   07/08/14 1211  Weight: 189 lb 3.2 oz (85.821 kg)    GENERAL:alert, no distress and comfortable SKIN: skin color, texture, turgor are normal, no rashes or significant lesions EYES: normal, conjunctiva are pink and non-injected, sclera clear OROPHARYNX:no exudate, no erythema and lips, buccal mucosa, and tongue normal  NECK: supple, thyroid normal size, non-tender, without nodularity LYMPH:  no palpable lymphadenopathy in the cervical, axillary or inguinal LUNGS: clear to auscultation and percussion with normal breathing effort HEART: regular rate & rhythm and no murmurs and no lower extremity edema ABDOMEN:abdomen soft, non-tender and normal bowel sounds. The perianal reconstruction site is much healed, with minimum clear discharge.  Musculoskeletal:no cyanosis of digits and no clubbing  PSYCH: alert & oriented x 3 with fluent speech NEURO: no focal motor/sensory deficits SKIN: multiple large healing skin ulcers on his arms, thighs, and trunk, no discharge.   LABORATORY DATA:  I have reviewed the data as listed Lab Results  Component Value Date   WBC 7.0 07/08/2014   HGB 12.5* 07/08/2014    HCT 37.1* 07/08/2014   MCV 85.3 07/08/2014   PLT 235 07/08/2014    Recent Labs  04/23/14 0545 04/24/14 0420 04/25/14 0505 05/25/14 1508 06/23/14 1236 07/08/14  1121  NA 136* 133* 138 135* 137 133*  K 4.5 3.8 3.9 4.1 4.0 4.2  CL 100 98 102  --   --   --   CO2 22 26 25 24 26 24   GLUCOSE 299* 239* 250* 103 90 177*  BUN 13 14 12  13.9 14.6 18.3  CREATININE 0.89 0.86 0.74 0.9 1.0 1.2  CALCIUM 8.0* 8.0* 7.9* 9.3 9.2 8.6  GFRNONAA >90 >90 >90  --   --   --   GFRAA >90 >90 >90  --   --   --   PROT  --   --   --  8.6* 8.8* 8.2  ALBUMIN  --   --   --  3.3* 3.8 3.7  AST  --   --   --  29 30 22   ALT  --   --   --  57* 44 30  ALKPHOS  --   --   --  128 98 103  BILITOT  --   --   --  0.20 0.22 <0.20   PATHOLOGY REPORT 04/22/2014 Diagnosis 1. Soft tissue, biopsy, right pelvic sidewall; r/o malignancy - FIBROADIPOSE TISSUE, NO EVIDENCE OF MALIGNANCY. 2. Colon, segmental resection for tumor, rectum,sigmoid and anus - RECURRENT INVASIVE POORLY DIFFERENTIATED SQUAMOUS CELL CARCINOMA, INVADING THROUGH THE MUSCULARIS PROPRIA, INTO PERICOLONIC FATTY TISSUE, EXTENDING TO THE DEEP INKED MARGIN. - PERINEURAL INVASION AND ANGIOLYMPHATIC PRESENT. - SEVEN OF SEVENTEEN LYMPH NODES, POSITIVE FOR METASTATIC CARCINOMA (7/17) Microscopic Comment 2. Anus Specimen: Anus, rectum, sigmoid Procedure: Segmental resection Tumor site: Distal ends of specimen Specimen integrity: Intact Invasive tumor: Maximum size: At least 2.5 cm Histologic type(s): Invasive squamous cell carcinoma Histologic grade and differentiation: G2-3, moderately to poorly differentiated Microscopic extension of invasive tumor: Invading through the muscularis propria into pericolonic fatty tissue involving adjacent skeletal muscle tissue Lymph-Vascular invasion: Present Peri-neural invasion: Present Resection margins: Deep margin is positive for tumor Treatment effect (neo-adjuvant therapy): Present, minimally Lymph nodes: number  examined 17; number positive: 7 Pathologic Staging: ypT2, yN1 (R1) Ancillary studies: N/A  RADIOGRAPHIC STUDIES: I have personally reviewed the radiological images as listed and agreed with the findings in the report. Dg Cystogram Voiding 05/17/2014    IMPRESSION:  1. Mild degradation secondary to patient's inability to void fully.  2. Given this factor, normal appearance of the urethra.     PET[facility] 01/19/2014 at [facility]  Negative for hypermetabolic lesion or metastatic disease   ASSESSMENT & PLAN:  54 years old male with past medical history of anal squamous cell carcinoma, status post surgical resection and concurrent chemoradiation in 2009, now has local recurrence status post APR in the reconstruction.ypT2, yN1 (R1), unfortunately his deep surgical margin was positive, and 7 out of 17 lymph nodes were positive also. No distant metastasis on the preop PET scan. His medical history is also complicated by diabetes, coronary artery disease status post stent placement, and HIV on treatment. Performance status 1.  1. Local recurrence of anal squamous cell carcinoma, with positive surgical margines -Per rad/onc, he is not a candidate for re-irradiation  -I discussed that the role of chemotherapy is to control the residual disease after surgery. Chemotherapy alone is unlikely going to be curative although. -I offered him systemic chemotherapy with cisplatin and 5-FU regimen for total of 4-6 cycles. He has good PS and organ functions, his HIV is under good control, his recent CD4 is normal at 710. His recent cardiac stress test was negative, and his cardiologist has  cleared him.  -he has had moderate side effects from the first cycle chemo: diarrhea, skin rash, mucositis and anorexia. I again discussed the management of these issues, he is doing better overall. I will call in magic mouth wash for him today.    2. HIV  -Well controlled. Continue his treatment . His recent CD4 count  was normal.   3. Hypertension and diabetes, coronary artery disease  -He will continue follow-up with his primary care physician and cardiologist. Continue medications. I'll monitor his blood pressure, blood sugar during his chemotherapy. I discussed that we sometimes use steroids during chemotherapy treatment, which can cause hyperglycemia.   4. Urethra leakage -He has been following-up with his urologist. He will likely keep the Foley during his chemotherapy treatment in the next 4 months. I discussed the increased risk of UTI especially after chemotherapy. He will continue prophylactic nitrofurantoin.   Plan -Magic mouth wash  -use imodium as need -pain meds as needed -I offered his IVF today but he declined -encourage nutrition supplement -RTC on his second cycle chemo, will likely use low dose dexa to prevent skin rash next time   Of note, his family members do not know his HIV status. I ensured him this will be kept confidential.  No orders of the defined types were placed in this encounter.     No problem-specific assessment & plan notes found for this encounter.    All questions were answered. The patient knows to call the clinic with any problems, questions or concerns.   I spent 20 minutes counseling the patient face to face. The total time spent in the appointment was 30 minutes and more than 50% was on counseling.     Truitt Merle, MD 07/11/2014 3:38 PM

## 2014-07-23 ENCOUNTER — Other Ambulatory Visit: Payer: Self-pay | Admitting: *Deleted

## 2014-07-23 DIAGNOSIS — C211 Malignant neoplasm of anal canal: Secondary | ICD-10-CM

## 2014-07-26 ENCOUNTER — Ambulatory Visit (HOSPITAL_BASED_OUTPATIENT_CLINIC_OR_DEPARTMENT_OTHER): Payer: Private Health Insurance - Indemnity

## 2014-07-26 ENCOUNTER — Encounter: Payer: Private Health Insurance - Indemnity | Admitting: Nutrition

## 2014-07-26 ENCOUNTER — Telehealth: Payer: Self-pay | Admitting: Hematology

## 2014-07-26 ENCOUNTER — Ambulatory Visit (HOSPITAL_BASED_OUTPATIENT_CLINIC_OR_DEPARTMENT_OTHER): Payer: Private Health Insurance - Indemnity | Admitting: Hematology

## 2014-07-26 ENCOUNTER — Other Ambulatory Visit (HOSPITAL_BASED_OUTPATIENT_CLINIC_OR_DEPARTMENT_OTHER): Payer: Private Health Insurance - Indemnity

## 2014-07-26 ENCOUNTER — Encounter: Payer: Self-pay | Admitting: Hematology

## 2014-07-26 ENCOUNTER — Telehealth: Payer: Self-pay | Admitting: *Deleted

## 2014-07-26 VITALS — BP 150/77 | HR 96 | Temp 98.4°F | Resp 18 | Ht 71.0 in | Wt 192.3 lb

## 2014-07-26 DIAGNOSIS — C211 Malignant neoplasm of anal canal: Secondary | ICD-10-CM

## 2014-07-26 DIAGNOSIS — I2510 Atherosclerotic heart disease of native coronary artery without angina pectoris: Secondary | ICD-10-CM

## 2014-07-26 DIAGNOSIS — C210 Malignant neoplasm of anus, unspecified: Secondary | ICD-10-CM

## 2014-07-26 DIAGNOSIS — Z5111 Encounter for antineoplastic chemotherapy: Secondary | ICD-10-CM

## 2014-07-26 DIAGNOSIS — I10 Essential (primary) hypertension: Secondary | ICD-10-CM

## 2014-07-26 DIAGNOSIS — B20 Human immunodeficiency virus [HIV] disease: Secondary | ICD-10-CM

## 2014-07-26 DIAGNOSIS — E119 Type 2 diabetes mellitus without complications: Secondary | ICD-10-CM

## 2014-07-26 DIAGNOSIS — R32 Unspecified urinary incontinence: Secondary | ICD-10-CM

## 2014-07-26 LAB — CBC WITH DIFFERENTIAL/PLATELET
BASO%: 0.7 % (ref 0.0–2.0)
BASOS ABS: 0 10*3/uL (ref 0.0–0.1)
EOS ABS: 0.1 10*3/uL (ref 0.0–0.5)
EOS%: 1 % (ref 0.0–7.0)
HCT: 35.9 % — ABNORMAL LOW (ref 38.4–49.9)
HGB: 11.6 g/dL — ABNORMAL LOW (ref 13.0–17.1)
LYMPH#: 1.8 10*3/uL (ref 0.9–3.3)
LYMPH%: 27 % (ref 14.0–49.0)
MCH: 28.6 pg (ref 27.2–33.4)
MCHC: 32.4 g/dL (ref 32.0–36.0)
MCV: 88.3 fL (ref 79.3–98.0)
MONO#: 0.7 10*3/uL (ref 0.1–0.9)
MONO%: 10.2 % (ref 0.0–14.0)
NEUT#: 4.2 10*3/uL (ref 1.5–6.5)
NEUT%: 61.1 % (ref 39.0–75.0)
PLATELETS: 218 10*3/uL (ref 140–400)
RBC: 4.07 10*6/uL — AB (ref 4.20–5.82)
RDW: 15.7 % — AB (ref 11.0–14.6)
WBC: 6.8 10*3/uL (ref 4.0–10.3)

## 2014-07-26 LAB — COMPREHENSIVE METABOLIC PANEL (CC13)
ALK PHOS: 129 U/L (ref 40–150)
ALT: 32 U/L (ref 0–55)
AST: 21 U/L (ref 5–34)
Albumin: 3.5 g/dL (ref 3.5–5.0)
Anion Gap: 11 mEq/L (ref 3–11)
BUN: 12.8 mg/dL (ref 7.0–26.0)
CO2: 22 meq/L (ref 22–29)
Calcium: 8.9 mg/dL (ref 8.4–10.4)
Chloride: 101 mEq/L (ref 98–109)
Creatinine: 1.1 mg/dL (ref 0.7–1.3)
EGFR: 76 mL/min/{1.73_m2} — ABNORMAL LOW (ref >90)
Glucose: 291 mg/dl — ABNORMAL HIGH (ref 70–140)
Potassium: 4.2 mEq/L (ref 3.5–5.1)
Sodium: 134 mEq/L — ABNORMAL LOW (ref 136–145)
TOTAL PROTEIN: 8.1 g/dL (ref 6.4–8.3)
Total Bilirubin: <0.2 mg/dL (ref 0.20–1.20)

## 2014-07-26 MED ORDER — SODIUM CHLORIDE 0.9 % IV SOLN
850.0000 mg/m2/d | INTRAVENOUS | Status: DC
  Administered 2014-07-26: 8950 mg via INTRAVENOUS
  Filled 2014-07-26: qty 179

## 2014-07-26 MED ORDER — DEXAMETHASONE 4 MG PO TABS: 4.0000 mg | ORAL_TABLET | Freq: Two times a day (BID) | ORAL | Status: DC

## 2014-07-26 NOTE — Progress Notes (Signed)
[name] NOTE  Patient Care Team: Julian Prows, MD as PCP - General (Infectious Diseases) Julian Bellow, MD (General Surgery) Julian Ruff, MD as Consulting Physician (General Surgery) Julian Limbo, MD as Consulting Physician (Plastic Surgery) Julian Merle, MD as Consulting Physician (Hematology) Julian Aloe, MD as Consulting Physician (Urology)  DIAGNOSIS Recurrent anal cancer    Primary cancer of anal canal   02/05/2011 Initial Diagnosis Primary cancer of anal canal, T1N0M0, s/p surgical resection with positive margines. He received definitive dose RT 45 Gy over 5 weeks with 16Gy boosting and concurrent chemo with 5FU, treated at [facility] by Drs.  Julian Palmer and Julian Palmer.    11/23/2013 Relapse/Recurrence local recurrence    04/21/2014 Surgery abdominal perianal resection (APR) and VRAM flap closure of perineal wound on 04/21/14, surgical margins were positive for cancer      OTHER RELATED ISSUES: 1. (+) HIV, on therapy with good control  2. CAD, s/p stent placement in 1998  3. Urine leakage after surgery on 04/21/14, has a foley in place now   CURRENT THERAPY: 5-FU 1000mg /m2/day on D1-5, ciplatin 100mg /m2 on D2, every 28 days, started on 06/28/2014  INTERIM HISTORY: Julian Palmer returns for follow up and second cycle of chemotherapy. He is very happy that he had foley removed by his urologist this morning. He was able to urinate on his own afterwards.  He otherwise feels well now, his mucositis resolved 2 weeks ago, and skin rashes all helaed. No fever, diarrhea. He missed his BP meds and his insulin for Julian past week.     MEDICAL HISTORY:  Past Medical History  Diagnosis Date  . S/p bare metal coronary artery stent     1998  pLAD  . Type 2 diabetes mellitus   . Hypertension   . 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 Hyperlipidemia   . History of carcinoma in situ of anal canal     2010  . ED (erectile dysfunction)   . Sigmoid diverticulosis   . PONV (postoperative nausea and vomiting)   . Wears glasses   . At risk for sleep apnea     STOP-BANG= 4     SENT TO PCP 03-23-2014  . Coronary artery disease CARDIOLOGIST--  DR Rockey Situ --  LAST LOV 04-17-2013    S/P  PCI to mid and distal LAD and BM stent x1 to pLAD  . Anxiety   . Depression   . HIV positive   . Recurrent squamous cell carcinoma of anus     removal mass 01-08-2014  . Primary cancer of anal canal 05/18/2011    SURGICAL HISTORY: Past Surgical History  Procedure Laterality Date  . Septoplasty  1985  . Sphincterotomy  2009  . Cardiovascular stress test  04/ 2010    low risk study/  very small region of mildly reduced perfersion is mildly reversible dLAD territory  . Resection anal mass  02-05-2011  . Excision anal polyp  2009  . Re-excision anal squamous cell carcinoma  2010  . Removal rectal mass  01-08-2014  . Eua w/ bx perianal skin  06-08-2009  . Port-a-cath placement  02-21-2011    REMOVED  2013  . Coronary angioplasty with stent placement  1998   [facility]    BM stent to pLAD and PCI to Mid and  Distal LAD/  no other significant cad  . Inguinal hernia repair Bilateral 1988  . Colonoscopy with propofol  05-28-2008  . Mass excision N/A 03/26/2014    Procedure: ANAL EXAM UNDER ANESTHESIA,EXCISIONAL BIOPSY OF ANAL MASS;  Surgeon: Julian Ruff, MD;  Location: [place];  Service: General;  Laterality: N/A;  . Laparoscopic assisted abdominal perineal resection N/A 04/22/2014    Procedure: LAPAROSCOPIC  ABDOMINAL PERINEAL RESECTION, ;  Surgeon: Julian Ruff, MD;  Location: [facility];  Service: General;  Laterality: N/A;  . Urethrotomy Bilateral 04/22/2014    Procedure: CYSTOSCOPY/URETHROTOMY;  Surgeon: Julian Ruff, MD;  Location: [facility];  Service: General;  Laterality: Bilateral;  .  Muscle flap closure N/A 04/22/2014    Procedure: VRAM FLAP;  Surgeon: Julian Limbo, MD;  Location: [facility];  Service: Plastics;  Laterality: N/A;  . Portacath placement Left 06/24/2014    Procedure: INSERTION PORT-A-CATH LEFT SUBCLAVIAN;  Surgeon: Julian Ruff, MD;  Location: [facility];  Service: General;  Laterality: Left;    SOCIAL HISTORY: History   Social History  . Marital Status: Single    Spouse Name: N/A    Number of Children: 0  . Years of Education: N/A   Occupational History  .  [employer] History Main Topics  . Smoking status: Never Smoker   . Smokeless tobacco: Never Used  . Alcohol Use: No     Comment: RARE  . Drug Use: No  . Sexual Activity: Not on file   Other Topics Concern  . Not on file   Social History Narrative   Full time. Single. Does not regularly exercise.     FAMILY HISTORY: Family History  Problem Relation Age of Onset  . Diabetes type II Mother   . Breast cancer Mother 22     survivor  . Hypertension Mother   . Coronary artery disease Father   Paternal GM had utrine cancer, no other history of malignancy   ALLERGIES:  is allergic to sulfa antibiotics.  MEDICATIONS:  Current Outpatient Prescriptions  Medication Sig Dispense Refill  . Alum & Mag Hydroxide-Simeth (MAGIC MOUTHWASH) SOLN Take 5 mLs by mouth 4 (four) times daily as needed for mouth pain (Nystatin, Lidocaine, Maalox, and Benadryl  ingredients.). 240 mL 0  . aspirin (ASPIR-81) 81 MG EC tablet Take 81 mg by mouth at bedtime.     Marland Kitchen atorvastatin (LIPITOR) 80 MG tablet Take 80 mg by mouth every evening.    . citalopram (CELEXA) 40 MG tablet Take 40 mg by mouth every morning.    Marland Kitchen efavirenz-emtrictabine-tenofovir (ATRIPLA) 600-200-300 MG per tablet Take 1 tablet by mouth at bedtime.     Marland Kitchen glimepiride (AMARYL) 2 MG tablet Take 4 mg by mouth 2 (two) times daily.     Marland Kitchen glucose blood ([employer]S BLOOD GLUCOSE TEST STRIPS) test strip 1 each by Other route 2 (two) times  daily.     Marland Kitchen HYDROCORTISONE, TOPICAL, 2 % LOTN Apply 1 application topically 2 (two) times daily. 1 Bottle 1  . hyoscyamine (LEVSIN SL) 0.125 MG SL tablet     . insulin glargine (LANTUS) 100 UNIT/ML injection Inject 0.25 mLs (25 Units total) into Julian skin at bedtime. (Patient taking differently: Inject 25 Units into Julian skin at bedtime as needed (blood sugar.). For Levels above 110) 10 mL 2  . Lancet Devices ([employer]S LANCING DEVICE) MISC 1 each by Other route 2 (two) times daily. Use 1 each once daily. Use as instructed.    Marland Kitchen  lidocaine-prilocaine (EMLA) cream Apply 1 application topically as needed. Apply to portacath 1 1/2 hours to 2 hours prior to procedures as needed. 30 g 1  . losartan (COZAAR) 100 MG tablet Take 100 mg by mouth every evening.    . metFORMIN (GLUCOPHAGE) 1000 MG tablet Take 1,000 mg by mouth 2 (two) times daily.      . Multiple Vitamins-Minerals (MULTIVITAL) tablet Take 1 tablet by mouth every evening.     . niacin (NIASPAN) 1000 MG CR tablet Take 1,000 mg by mouth at bedtime.    . nitrofurantoin (MACRODANTIN) 50 MG capsule Take 50 mg by mouth at bedtime.     . ondansetron (ZOFRAN) 8 MG tablet Take 1 tablet (8 mg total) by mouth every 8 (eight) hours as needed for nausea or vomiting. 60 tablet 3  . oxyCODONE (OXY IR/ROXICODONE) 5 MG immediate release tablet Take 1-3 tablets (5-15 mg total) by mouth every 4 (four) hours as needed for moderate pain, severe pain or breakthrough pain. 30 tablet 0  . prochlorperazine (COMPAZINE) 10 MG tablet Take 1 tablet (10 mg total) by mouth every 6 (six) hours as needed for nausea or vomiting. 30 tablet 0  . traZODone (DESYREL) 100 MG tablet Take 200 mg by mouth at bedtime.    . DPH-Lido-AlHydr-MgHydr-Simeth (FIRST-MOUTHWASH BLM) SUSP Swish and swallow 5 mLs 4 (four) times daily as needed.  0   No current facility-administered medications for this visit.    REVIEW OF SYSTEMS:   Constitutional: Denies fevers, chills or abnormal night sweats Eyes:  Denies blurriness of vision, double vision or watery eyes Ears, nose, mouth, throat, and face: Denies mucositis or sore throat Respiratory: Denies cough, dyspnea or wheezes Cardiovascular: Denies palpitation, chest discomfort or lower extremity swelling Gastrointestinal:  Denies nausea, heartburn or change in bowel habits Skin: Denies abnormal skin rashes Lymphatics: Denies new lymphadenopathy or easy bruising Neurological:Denies numbness, tingling or new weaknesses Behavioral/Psych: Mood is stable, no new changes  All other systems were reviewed with Julian patient and are negative.  PHYSICAL EXAMINATION: ECOG PERFORMANCE STATUS: 1 - Symptomatic but completely ambulatory  Filed Vitals:   07/26/14 1238  BP: 150/77  Pulse: 96  Temp: 98.4 F (36.9 C)  Resp: 18   Filed Weights   07/26/14 1238  Weight: 192 lb 4.8 oz (87.227 kg)    GENERAL:alert, no distress and comfortable SKIN: skin color, texture, turgor are normal, healed rashes on her arms, legs, and trunk with skin pigmentation.  EYES: normal, conjunctiva are pink and non-injected, sclera clear OROPHARYNX:no exudate, no erythema and lips, buccal mucosa, and tongue normal  NECK: supple, thyroid normal size, non-tender, without nodularity LYMPH:  no palpable lymphadenopathy in Julian cervical, axillary or inguinal LUNGS: clear to auscultation and percussion with normal breathing effort HEART: regular rate & rhythm and no murmurs and no lower extremity edema ABDOMEN:abdomen soft, non-tender and normal bowel sounds. Julian perianal reconstruction site is much healed, with minimum clear discharge.  Musculoskeletal:no cyanosis of digits and no clubbing  PSYCH: alert & oriented x 3 with fluent speech NEURO: no focal motor/sensory deficits SKIN: multiple large healing skin ulcers on his arms, thighs, and trunk, no discharge.   LABORATORY DATA:  I have reviewed Julian data as listed Lab Results  Component Value Date   WBC 6.8 07/26/2014    HGB 11.6* 07/26/2014   HCT 35.9* 07/26/2014   MCV 88.3 07/26/2014   PLT 218 07/26/2014    Recent Labs  04/23/14 0545 04/24/14 0420 04/25/14 0505  06/23/14  1236 07/08/14 1121 07/26/14 1217  NA 136* 133* 138  < > 137 133* 134*  K 4.5 3.8 3.9  < > 4.0 4.2 4.2  CL 100 98 102  --   --   --   --   CO2 22 26 25   < > 26 24 22   GLUCOSE 299* 239* 250*  < > 90 177* 291*  BUN 13 14 12   < > 14.6 18.3 12.8  CREATININE 0.89 0.86 0.74  < > 1.0 1.2 1.1  CALCIUM 8.0* 8.0* 7.9*  < > 9.2 8.6 8.9  GFRNONAA >90 >90 >90  --   --   --   --   GFRAA >90 >90 >90  --   --   --   --   PROT  --   --   --   < > 8.8* 8.2 8.1  ALBUMIN  --   --   --   < > 3.8 3.7 3.5  AST  --   --   --   < > 30 22 21   ALT  --   --   --   < > 44 30 32  ALKPHOS  --   --   --   < > 98 103 129  BILITOT  --   --   --   < > 0.22 <0.20 <0.20  < > = values in this interval not displayed. PATHOLOGY REPORT 04/22/2014 Diagnosis 1. Soft tissue, biopsy, right pelvic sidewall; r/o malignancy - FIBROADIPOSE TISSUE, NO EVIDENCE OF MALIGNANCY. 2. Colon, segmental resection for tumor, rectum,sigmoid and anus - RECURRENT INVASIVE POORLY DIFFERENTIATED SQUAMOUS CELL CARCINOMA, INVADING THROUGH Julian MUSCULARIS PROPRIA, INTO PERICOLONIC FATTY TISSUE, EXTENDING TO Julian DEEP INKED MARGIN. - PERINEURAL INVASION AND ANGIOLYMPHATIC PRESENT. - SEVEN OF SEVENTEEN LYMPH NODES, POSITIVE FOR METASTATIC CARCINOMA (7/17) Microscopic Comment 2. Anus Specimen: Anus, rectum, sigmoid Procedure: Segmental resection Tumor site: Distal ends of specimen Specimen integrity: Intact Invasive tumor: Maximum size: At least 2.5 cm Histologic type(s): Invasive squamous cell carcinoma Histologic grade and differentiation: G2-3, moderately to poorly differentiated Microscopic extension of invasive tumor: Invading through Julian muscularis propria into pericolonic fatty tissue involving adjacent skeletal muscle tissue Lymph-Vascular invasion: Present Peri-neural  invasion: Present Resection margins: Deep margin is positive for tumor Treatment effect (neo-adjuvant therapy): Present, minimally Lymph nodes: number examined 17; number positive: 7 Pathologic Staging: ypT2, yN1 (R1) Ancillary studies: N/A  RADIOGRAPHIC STUDIES: I have personally reviewed Julian radiological images as listed and agreed with Julian findings in Julian report. Dg Cystogram Voiding 05/17/2014    IMPRESSION:  1. Mild degradation secondary to patient's inability to void fully.  2. Given this factor, normal appearance of Julian urethra.     PET/CT 01/19/2014 at [facility]  Negative for hypermetabolic lesion or metastatic disease   ASSESSMENT & PLAN:  54 years old male with past medical history of anal squamous cell carcinoma, status post surgical resection and concurrent chemoradiation in 2009, now has local recurrence status post APR in Julian reconstruction.ypT2, yN1 (R1), unfortunately his deep surgical margin was positive, and 7 out of 17 lymph nodes were positive also. No distant metastasis on Julian preop PET scan. His medical history is also complicated by diabetes, coronary artery disease status post stent placement, and HIV on treatment. Performance status 1.  1. Local recurrence of anal squamous cell carcinoma, with positive surgical margines -Per rad/onc, he is not a candidate for re-irradiation  -I discussed that Julian role of chemotherapy is to control  Julian residual disease after surgery. Chemotherapy alone is unlikely going to be curative although. -I offered him systemic chemotherapy with cisplatin and 5-FU regimen for total of 4-6 cycles. He has good PS and organ functions, his HIV is under good control, his recent CD4 is normal at 710. His recent cardiac stress test was negative, and his cardiologist has cleared him for chemotherapy.  -he has had moderate side effects from Julian first cycle chemo: diarrhea, skin rash, mucositis and anorexia and tinnitus. Those are mostly  resolved by now. -His blood counts has recovered well, proceed with second cycle chemotherapy today. 5-FU dose reduction by 50% due to his skin and Julian mucosal side effects.  -Dexamethasone 4 mg once daily from days 3, continue for 7 days to see if prevents skin rash.  2. Diabetes -His blood sugar was close to 300 today, he has missed his answering for week. -I discussed Julian hyperglycemia related to steroids, he knows to check his blood sugar 3-4 times a day, and use insulin based on Julian sliding scale. He will call me if his blood sugar is above 200.   3. HIV  -Well controlled. Continue his treatment . His recent CD4 count was normal.   4. Hypertension and coronary artery disease  -He will continue follow-up with his primary care physician and cardiologist. Continue medications. I'll monitor his blood pressure, blood sugar during his chemotherapy. I discussed that we sometimes use steroids during chemotherapy treatment, which can cause hyperglycemia.   5. Urethra leakage -He now has his Foley removed. -monitoring   Plan --Cycle 2 chemotherapy today. 5-FU dose reduction 15% due to side effects -Dexamethasone 4 mg once daily from days 3-10, hold if significant hyperglycemia -Monitor blood glucoses closely, using 3 as needed -I ask him to call me next week -RTC in 4 weeks before cycle 3   Of note, his family members do not know his HIV status. I ensured him this will be kept confidential.   All questions were answered. Julian patient knows to call Julian clinic with any problems, questions or concerns.   I spent 20 minutes counseling Julian patient face to face. Julian total time spent in Julian appointment was 30 minutes and more than 50% was on counseling.     Julian Merle, MD 07/26/2014 1:29 PM

## 2014-07-26 NOTE — Telephone Encounter (Signed)
Gave avs & calendar for February. °

## 2014-07-26 NOTE — Patient Instructions (Signed)
[facility] Discharge Instructions for Patients Receiving Chemotherapy  Today you received the following chemotherapy agents 5FU  To help prevent nausea and vomiting after your treatment, we encourage you to take your nausea medication as directed   If you develop nausea and vomiting that is not controlled by your nausea medication, call the clinic.   BELOW ARE SYMPTOMS THAT SHOULD BE REPORTED IMMEDIATELY:  *FEVER GREATER THAN 100.5 F  *CHILLS WITH OR WITHOUT FEVER  NAUSEA AND VOMITING THAT IS NOT CONTROLLED WITH YOUR NAUSEA MEDICATION  *UNUSUAL SHORTNESS OF BREATH  *UNUSUAL BRUISING OR BLEEDING  TENDERNESS IN MOUTH AND THROAT WITH OR WITHOUT PRESENCE OF ULCERS  *URINARY PROBLEMS  *BOWEL PROBLEMS  UNUSUAL RASH Items with * indicate a potential emergency and should be followed up as soon as possible.  Feel free to call the clinic you have any questions or concerns. The clinic phone number is [phone].    

## 2014-07-26 NOTE — Telephone Encounter (Signed)
Per staff message and POF I have scheduled appts. Advised scheduler of appts. JMW  

## 2014-07-27 ENCOUNTER — Ambulatory Visit: Payer: Private Health Insurance - Indemnity | Admitting: Nutrition

## 2014-07-27 ENCOUNTER — Ambulatory Visit (HOSPITAL_BASED_OUTPATIENT_CLINIC_OR_DEPARTMENT_OTHER): Payer: Private Health Insurance - Indemnity

## 2014-07-27 DIAGNOSIS — C210 Malignant neoplasm of anus, unspecified: Secondary | ICD-10-CM

## 2014-07-27 DIAGNOSIS — Z5111 Encounter for antineoplastic chemotherapy: Secondary | ICD-10-CM

## 2014-07-27 DIAGNOSIS — C211 Malignant neoplasm of anal canal: Secondary | ICD-10-CM

## 2014-07-27 MED ORDER — POTASSIUM CHLORIDE 2 MEQ/ML IV SOLN
Freq: Once | INTRAVENOUS | Status: AC
  Administered 2014-07-27: 11:00:00 via INTRAVENOUS
  Filled 2014-07-27: qty 10

## 2014-07-27 MED ORDER — SODIUM CHLORIDE 0.9 % IV SOLN
Freq: Once | INTRAVENOUS | Status: AC
  Administered 2014-07-27: 11:00:00 via INTRAVENOUS

## 2014-07-27 MED ORDER — SODIUM CHLORIDE 0.9 % IV SOLN
150.0000 mg | Freq: Once | INTRAVENOUS | Status: AC
  Administered 2014-07-27: 150 mg via INTRAVENOUS
  Filled 2014-07-27: qty 5

## 2014-07-27 MED ORDER — PALONOSETRON HCL INJECTION 0.25 MG/5ML
INTRAVENOUS | Status: AC
  Filled 2014-07-27: qty 5

## 2014-07-27 MED ORDER — PALONOSETRON HCL INJECTION 0.25 MG/5ML
0.2500 mg | Freq: Once | INTRAVENOUS | Status: AC
  Administered 2014-07-27: 0.25 mg via INTRAVENOUS

## 2014-07-27 MED ORDER — DEXAMETHASONE SODIUM PHOSPHATE 20 MG/5ML IJ SOLN
INTRAMUSCULAR | Status: AC
  Filled 2014-07-27: qty 5

## 2014-07-27 MED ORDER — DEXAMETHASONE SODIUM PHOSPHATE 20 MG/5ML IJ SOLN
12.0000 mg | Freq: Once | INTRAMUSCULAR | Status: AC
  Administered 2014-07-27: 12 mg via INTRAVENOUS

## 2014-07-27 MED ORDER — CISPLATIN CHEMO INJECTION 100MG/100ML
100.0000 mg/m2 | Freq: Once | INTRAVENOUS | Status: AC
  Administered 2014-07-27: 211 mg via INTRAVENOUS
  Filled 2014-07-27: qty 211

## 2014-07-27 NOTE — Patient Instructions (Signed)
[facility] Discharge Instructions for Patients Receiving Chemotherapy  Today you received the following chemotherapy agents Cisplatin.  To help prevent nausea and vomiting after your treatment, we encourage you to take your nausea medication as directed.    If you develop nausea and vomiting that is not controlled by your nausea medication, call the clinic.   BELOW ARE SYMPTOMS THAT SHOULD BE REPORTED IMMEDIATELY:  *FEVER GREATER THAN 100.5 F  *CHILLS WITH OR WITHOUT FEVER  NAUSEA AND VOMITING THAT IS NOT CONTROLLED WITH YOUR NAUSEA MEDICATION  *UNUSUAL SHORTNESS OF BREATH  *UNUSUAL BRUISING OR BLEEDING  TENDERNESS IN MOUTH AND THROAT WITH OR WITHOUT PRESENCE OF ULCERS  *URINARY PROBLEMS  *BOWEL PROBLEMS  UNUSUAL RASH Items with * indicate a potential emergency and should be followed up as soon as possible.  Feel free to call the clinic you have any questions or concerns. The clinic phone number is [phone].    

## 2014-07-27 NOTE — Progress Notes (Signed)
300cc output chemo released Output total= 1225cc

## 2014-07-27 NOTE — Progress Notes (Signed)
54 year old male diagnosed with recurrent anal cancer.  Status post chemoradiation therapy.  He is a patient of Dr. Burr Medico.  Past medical history includes HIV positive, mild MI, stents, non-insulin-dependent diabetes, hypertension, hyperlipidemia, postop nausea vomiting, and depression.  Medications include Lipitor, Celexa, Amaryl, Glucophage, multivitamin, Zofran, and Compazine.  Sodium 134 and glucose 291 on February 1.  Height: 5 feet 11 inches. Weight: 192.3 pounds February 1. Usual body weight: 212 pounds October 2015. BMI: 26.83.  Patient reports poor appetite and nausea. He has had a very sore mouth and lips which limited oral intake.  Patient also reports diarrhea. Patient will tolerate oral nutrition supplements and is interested in trying low carbohydrate choices.  Nutrition diagnosis: Unintended weight loss related to inadequate oral intake as evidenced by 9% weight loss over 3 months.  Intervention: Patient educated to consume small frequent meals with high protein foods. Educated patient on carbohydrate controlled diet. Discouraged sweetened soft drinks and simple sugars. Recommended if patient chooses desserts to monitor portion control and consume after mealtimes. Provided patient with oral nutrition supplement samples consisting of Glucerna and boost glucose control.  Also provided coupons. Patient given fact sheets and contact information. Questions answered and teach back method used.  Monitoring, evaluation, goals: Patient will continue adequate oral intake to promote weight maintenance.  Next visit: Monday, February 29, during infusion.  **Disclaimer: This note was dictated with voice recognition software. Similar sounding words can inadvertently be transcribed and this note may contain transcription errors which may not have been corrected upon publication of note.**

## 2014-07-28 ENCOUNTER — Ambulatory Visit: Payer: Private Health Insurance - Indemnity

## 2014-07-31 ENCOUNTER — Ambulatory Visit (HOSPITAL_BASED_OUTPATIENT_CLINIC_OR_DEPARTMENT_OTHER): Payer: Private Health Insurance - Indemnity

## 2014-07-31 DIAGNOSIS — Z452 Encounter for adjustment and management of vascular access device: Secondary | ICD-10-CM

## 2014-07-31 DIAGNOSIS — C210 Malignant neoplasm of anus, unspecified: Secondary | ICD-10-CM

## 2014-07-31 MED ORDER — SODIUM CHLORIDE 0.9 % IJ SOLN
10.0000 mL | INTRAMUSCULAR | Status: DC | PRN
  Administered 2014-07-31: 10 mL
  Filled 2014-07-31: qty 10

## 2014-07-31 MED ORDER — HEPARIN SOD (PORK) LOCK FLUSH 100 UNIT/ML IV SOLN
500.0000 [IU] | Freq: Once | INTRAVENOUS | Status: AC | PRN
  Administered 2014-07-31: 500 [IU]
  Filled 2014-07-31: qty 5

## 2014-08-02 ENCOUNTER — Telehealth: Payer: Self-pay | Admitting: *Deleted

## 2014-08-02 NOTE — Telephone Encounter (Addendum)
VERBAL ORDER AND READ BACK TO CINDEE BACON,NP- PT. TO TRY BENADRYL 25MG  EVERY FOUR TO SIX HOURS AS NEEDED AND PEPCID 20MG  EVERY 12 HOURS AS NEEDED. NOTIFIED PT. HE VOICES UNDERSTANDING.

## 2014-08-02 NOTE — Telephone Encounter (Signed)
@  LOGO@ Provider input needed: POSSIBLE VISIT.  Patient Name: Julian Palmer  MRN: [id number] DOB: Oct 12, 1960  Date: @TODAY @ Telephone: [phone] (home) [phone] (work) CSN: [id number]   Allergies: is allergic to sulfa antibiotics.      Reason for call:RASH  Chief Complaint  Patient presents with  . Rash    "SPOTS" AFTER 5FU INFUSION ON 07/26/14.       Patient last received chemotherapy/ treatment on: 07/26/14.  Patient was last seen in the office on 07/26/14.  Next appt is 08/23/14.      Is patient having fevers greater than 100.5? Yes []    No []    Comments:    Is patient having uncontrolled pain, or new pain? Yes []    No []    Comments:   Is patient having new back pain that changes with position Yes []                                   (worsens or eases when laying down?) No []    Comments:     Is patient able to eat and drink? Yes [x]    No []    Comments:    Is patient able to pass stool without difficulty?  Yes []    No []    Comments:     Is patient having uncontrolled nausea?  Yes []    No [x]    Comments:  Summary PT. HAD "SPOTS" AFTER 06/28/14 5FU INFUSION. THEY "SCABBED" OVER AND WENT AWAY. THE HYDROCORTISONE LOTION HELPED THAT DR.FENG PRESCRIBED. PT.'S SKIN COLOR HAS NOT RETURNED TO NORMAL. AFTER 07/26/14 5FU INFUSION THE "SPOTS" HAVE RETURNED IN NEW AREAS AND ARE "TWICE AS BAD". THEY ARE RED AND FLAT. THE RASH IS ON THE SIDES OF HIS CHEST, UNDERARMS, AND GROIN AREA. IT DOES NOT HURT BUT SOME ITCHING. UNABLE TO USE HYDROCORTISONE LOTION.  Based on the above information advised patient to  AWAIT A RETURNED CALL.   @[facility] @  08/02/2014, 10:24 AM

## 2014-08-02 NOTE — Telephone Encounter (Signed)
Called pt after talking with Dr. Burr Medico & she suggested that pt take oral steroids & use topical steroids also.  Instructed pt & he states he will try to refill meds.  Pt informed to call if symptoms worse.

## 2014-08-05 ENCOUNTER — Other Ambulatory Visit: Payer: Self-pay | Admitting: *Deleted

## 2014-08-05 ENCOUNTER — Telehealth: Payer: Self-pay | Admitting: *Deleted

## 2014-08-05 NOTE — Telephone Encounter (Signed)
THE RASH IS ABOUT THE SAME. HIS SKIN COLOR HAS NOT RETURNED TO NORMAL. PT. WOULD LIKE TO SEE DR.FENG BEFORE 08/23/14 APPOINTMENT AND TREATMENT. PT. IS NOT SURE HE WANTS TO CONTINUE TREATMENT.

## 2014-08-06 ENCOUNTER — Telehealth: Payer: Self-pay | Admitting: Hematology

## 2014-08-06 ENCOUNTER — Telehealth: Payer: Self-pay | Admitting: *Deleted

## 2014-08-06 NOTE — Telephone Encounter (Signed)
Spoke with pt and informed pt that a scheduler will contact pt with new appt date and time.  Pt voiced understanding.

## 2014-08-06 NOTE — Telephone Encounter (Signed)
per pof to sch pt appt-cld & spoke to pt and adv of time & date-pt understood

## 2014-08-11 ENCOUNTER — Ambulatory Visit (HOSPITAL_BASED_OUTPATIENT_CLINIC_OR_DEPARTMENT_OTHER): Payer: Private Health Insurance - Indemnity | Admitting: Hematology

## 2014-08-11 ENCOUNTER — Encounter: Payer: Self-pay | Admitting: *Deleted

## 2014-08-11 ENCOUNTER — Other Ambulatory Visit (HOSPITAL_BASED_OUTPATIENT_CLINIC_OR_DEPARTMENT_OTHER): Payer: Private Health Insurance - Indemnity

## 2014-08-11 VITALS — BP 137/77 | HR 85 | Temp 98.0°F | Resp 18 | Ht 71.0 in | Wt 195.1 lb

## 2014-08-11 DIAGNOSIS — C210 Malignant neoplasm of anus, unspecified: Secondary | ICD-10-CM

## 2014-08-11 DIAGNOSIS — E119 Type 2 diabetes mellitus without complications: Secondary | ICD-10-CM

## 2014-08-11 DIAGNOSIS — B20 Human immunodeficiency virus [HIV] disease: Secondary | ICD-10-CM

## 2014-08-11 DIAGNOSIS — C211 Malignant neoplasm of anal canal: Secondary | ICD-10-CM

## 2014-08-11 DIAGNOSIS — I2510 Atherosclerotic heart disease of native coronary artery without angina pectoris: Secondary | ICD-10-CM

## 2014-08-11 DIAGNOSIS — Z933 Colostomy status: Secondary | ICD-10-CM

## 2014-08-11 DIAGNOSIS — F329 Major depressive disorder, single episode, unspecified: Secondary | ICD-10-CM

## 2014-08-11 DIAGNOSIS — I10 Essential (primary) hypertension: Secondary | ICD-10-CM

## 2014-08-11 LAB — COMPREHENSIVE METABOLIC PANEL (CC13)
ALBUMIN: 3.6 g/dL (ref 3.5–5.0)
ALT: 21 U/L (ref 0–55)
AST: 14 U/L (ref 5–34)
Alkaline Phosphatase: 88 U/L (ref 40–150)
Anion Gap: 10 mEq/L (ref 3–11)
BUN: 16.3 mg/dL (ref 7.0–26.0)
CO2: 24 mEq/L (ref 22–29)
CREATININE: 1.2 mg/dL (ref 0.7–1.3)
Calcium: 9 mg/dL (ref 8.4–10.4)
Chloride: 102 mEq/L (ref 98–109)
EGFR: 68 mL/min/{1.73_m2} — ABNORMAL LOW (ref >90)
GLUCOSE: 298 mg/dL — AB (ref 70–140)
Potassium: 4.3 mEq/L (ref 3.5–5.1)
Sodium: 136 mEq/L (ref 136–145)
Total Bilirubin: <0.2 mg/dL (ref 0.20–1.20)
Total Protein: 7.4 g/dL (ref 6.4–8.3)

## 2014-08-11 LAB — CBC WITH DIFFERENTIAL/PLATELET
BASO%: 0.2 % (ref 0.0–2.0)
Basophils Absolute: 0 10*3/uL (ref 0.0–0.1)
EOS%: 0.7 % (ref 0.0–7.0)
Eosinophils Absolute: 0 10*3/uL (ref 0.0–0.5)
HCT: 33.9 % — ABNORMAL LOW (ref 38.4–49.9)
HGB: 11.7 g/dL — ABNORMAL LOW (ref 13.0–17.1)
LYMPH%: 47.5 % (ref 14.0–49.0)
MCH: 29.5 pg (ref 27.2–33.4)
MCHC: 34.5 g/dL (ref 32.0–36.0)
MCV: 85.4 fL (ref 79.3–98.0)
MONO#: 0.6 10*3/uL (ref 0.1–0.9)
MONO%: 14.2 % — ABNORMAL HIGH (ref 0.0–14.0)
NEUT#: 1.5 10*3/uL (ref 1.5–6.5)
NEUT%: 37.4 % — ABNORMAL LOW (ref 39.0–75.0)
Platelets: 155 10*3/uL (ref 140–400)
RBC: 3.97 10*6/uL — ABNORMAL LOW (ref 4.20–5.82)
RDW: 15.2 % — ABNORMAL HIGH (ref 11.0–14.6)
WBC: 4 10*3/uL (ref 4.0–10.3)
lymph#: 1.9 10*3/uL (ref 0.9–3.3)

## 2014-08-11 LAB — MAGNESIUM (CC13): MAGNESIUM: 1.9 mg/dL (ref 1.5–2.5)

## 2014-08-11 NOTE — Progress Notes (Signed)
Pt feeling down off & on but not suicidal. Discussed support groups but he feels that he lives here & most of the support groups meet here.  Discussed SW/Chaplancy program & encouraged to talk to somebody. Pt has a psychologist & psychiatrist but hasn't been to either in a while.  Asked Manuela Schwartz Coward/Colon Navigator to talk to pt & she did.  Pt very anxious & state he about lost it with her.

## 2014-08-11 NOTE — [facility] Oncology Navigator Note (Unsigned)
Introduced patient to my role as GI Leisure centre manager and provided contact information. He openly discussed the psychological and physical issues he has with his colostomy. Allowed him to express his feelings and he confirms he has more good days than bad days and still has things in his life that brings joy. Provided information on the ostomy support group that meets at the [facility] and he expressed interest in going to this. He agrees to meet with Navigator at his CDDP tx day. Already has contact with the wound/ostomy nurse, Margarita Grizzle and has no difficulty getting supplies.

## 2014-08-11 NOTE — Progress Notes (Signed)
[place] NOTE  Patient Care Team: [name], MD as PCP - General (Infectious Diseases) Robert Bellow, MD (General Surgery) Leighton Ruff, MD as Consulting Physician (General Surgery) Irene Limbo, MD as Consulting Physician (Plastic Surgery) Truitt Merle, MD as Consulting Physician (Hematology) Festus Aloe, MD as Consulting Physician (Urology)  DIAGNOSIS Recurrent anal cancer    Primary cancer of anal canal   02/05/2011 Initial Diagnosis Primary cancer of anal canal, T1N0M0, s/p surgical resection with positive margines. He received definitive dose RT 45 Gy over 5 weeks with 16Gy boosting and concurrent chemo with 5FU, treated at [facility] by Drs.  Geoffreyh and [employer].    11/23/2013 Relapse/Recurrence local recurrence    04/21/2014 Surgery abdominal perianal resection (APR) and VRAM flap closure of perineal wound on 04/21/14, surgical margins were positive for cancer      OTHER RELATED ISSUES: 1. (+) HIV, on therapy with good control  2. CAD, s/p stent placement in 1998  3. Urine leakage after surgery on 04/21/14, has a foley in place now   CURRENT THERAPY: 5-FU 1000mg /m2/day on D1-5, ciplatin 100mg /m2 on D2, every 28 days, started on 06/28/2014  INTERIM HISTORY: Julian Palmer returns for follow up after second cycle chemo. He did develop diffuse skin rash again after the second cycle chemotherapy, although his last severe than last time, no skin ulcers, pain or itchiness. His appetite and energy level was significant down for over 1 week, and has gradually recovered this week. He is quite depressed with the sick feeling after chemotherapy. No fever or chills.  MEDICAL HISTORY:  Past Medical History  Diagnosis Date  . S/p bare metal coronary artery stent     1998  pLAD  . Type 2 diabetes mellitus   . Hypertension   . 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 Hyperlipidemia   . History of carcinoma in situ of anal canal     2010  . ED (erectile dysfunction)   . Sigmoid diverticulosis   . PONV (postoperative nausea and vomiting)   . Wears glasses   . At risk for sleep apnea     STOP-BANG= 4     SENT TO PCP 03-23-2014  . Coronary artery disease CARDIOLOGIST--  DR Rockey Situ --  LAST LOV 04-17-2013    S/P  PCI to mid and distal LAD and BM stent x1 to pLAD  . Anxiety   . Depression   . HIV positive   . Recurrent squamous cell carcinoma of anus     removal mass 01-08-2014  . Primary cancer of anal canal 05/18/2011    SURGICAL HISTORY: Past Surgical History  Procedure Laterality Date  . Septoplasty  1985  . Sphincterotomy  2009  . Cardiovascular stress test  04/ 2010    low risk study/  very small region of mildly reduced perfersion is mildly reversible dLAD territory  . Resection anal mass  02-05-2011  . Excision anal polyp  2009  . Re-excision anal squamous cell carcinoma  2010  . Removal rectal mass  01-08-2014  . Eua w/ bx perianal skin  06-08-2009  . Port-a-cath placement  02-21-2011    REMOVED  2013  . Coronary angioplasty with stent placement  1998   [facility]    BM stent to pLAD and PCI to Mid and Distal LAD/  no other significant cad  . Inguinal hernia repair Bilateral 1988  . Colonoscopy with propofol  05-28-2008  . Mass excision N/A 03/26/2014    Procedure: ANAL EXAM UNDER ANESTHESIA,EXCISIONAL BIOPSY OF ANAL MASS;  Surgeon: Leighton Ruff, MD;  Location: [facility];  Service: General;  Laterality: N/A;  . Laparoscopic assisted abdominal perineal resection N/A 04/22/2014    Procedure: LAPAROSCOPIC  ABDOMINAL PERINEAL RESECTION, ;  Surgeon: Leighton Ruff, MD;  Location: [facility];  Service: General;  Laterality: N/A;  . Urethrotomy Bilateral 04/22/2014    Procedure: CYSTOSCOPY/URETHROTOMY;  Surgeon: Leighton Ruff, MD;  Location: [facility];   Service: General;  Laterality: Bilateral;  . Muscle flap closure N/A 04/22/2014    Procedure: VRAM FLAP;  Surgeon: Irene Limbo, MD;  Location: [facility];  Service: Plastics;  Laterality: N/A;  . Portacath placement Left 06/24/2014    Procedure: INSERTION PORT-A-CATH LEFT SUBCLAVIAN;  Surgeon: Leighton Ruff, MD;  Location: [facility];  Service: General;  Laterality: Left;    SOCIAL HISTORY: History   Social History  . Marital Status: Single    Spouse Name: N/A  . Number of Children: 0  . Years of Education: N/A   Occupational History  .  [place] History Main Topics  . Smoking status: Never Smoker   . Smokeless tobacco: Never Used  . Alcohol Use: No     Comment: RARE  . Drug Use: No  . Sexual Activity: Not on file   Other Topics Concern  . Not on file   Social History Narrative   Full time. Single. Does not regularly exercise.     FAMILY HISTORY: Family History  Problem Relation Age of Onset  . Diabetes type II Mother   . Breast cancer Mother 41     survivor  . Hypertension Mother   . Coronary artery disease Father   Paternal GM had utrine cancer, no other history of malignancy   ALLERGIES:  is allergic to sulfa antibiotics.  MEDICATIONS:  Current Outpatient Prescriptions  Medication Sig Dispense Refill  . Alum & Mag Hydroxide-Simeth (MAGIC MOUTHWASH) SOLN Take 5 mLs by mouth 4 (four) times daily as needed for mouth pain (Nystatin, Lidocaine, Maalox, and Benadryl  ingredients.). 240 mL 0  . aspirin (ASPIR-81) 81 MG EC tablet Take 81 mg by mouth at bedtime.     Marland Kitchen atorvastatin (LIPITOR) 80 MG tablet Take 80 mg by mouth every evening.    . citalopram (CELEXA) 40 MG tablet Take 40 mg by mouth every morning.    Marland Kitchen dexamethasone (DECADRON) 4 MG tablet Take 1 tablet (4 mg total) by mouth 2 (two) times daily with a meal. 7 tablet 2  . DPH-Lido-AlHydr-MgHydr-Simeth (FIRST-MOUTHWASH BLM) SUSP Swish and swallow 5 mLs 4 (four) times daily as needed.  0  .  efavirenz-emtrictabine-tenofovir (ATRIPLA) 600-200-300 MG per tablet Take 1 tablet by mouth at bedtime.     Marland Kitchen glimepiride (AMARYL) 2 MG tablet Take 4 mg by mouth 2 (two) times daily.     Marland Kitchen glucose blood ([employer]S BLOOD GLUCOSE TEST STRIPS) test strip 1 each by Other route 2 (two) times daily.     Marland Kitchen HYDROCORTISONE, TOPICAL, 2 % LOTN Apply 1 application topically 2 (two) times daily. 1 Bottle 1  . hyoscyamine (LEVSIN SL) 0.125 MG SL tablet     . insulin glargine (LANTUS) 100 UNIT/ML injection Inject 0.25 mLs (25 Units total) into the skin at bedtime. (Patient taking differently: Inject 25 Units into the skin at  bedtime as needed (blood sugar.). For Levels above 110) 10 mL 2  . Lancet Devices ([employer]S LANCING DEVICE) MISC 1 each by Other route 2 (two) times daily. Use 1 each once daily. Use as instructed.    . lidocaine-prilocaine (EMLA) cream Apply 1 application topically as needed. Apply to portacath 1 1/2 hours to 2 hours prior to procedures as needed. 30 g 1  . losartan (COZAAR) 100 MG tablet Take 100 mg by mouth every evening.    . metFORMIN (GLUCOPHAGE) 1000 MG tablet Take 1,000 mg by mouth 2 (two) times daily.      . Multiple Vitamins-Minerals (MULTIVITAL) tablet Take 1 tablet by mouth every evening.     . niacin (NIASPAN) 1000 MG CR tablet Take 1,000 mg by mouth at bedtime.    . nitrofurantoin (MACRODANTIN) 50 MG capsule Take 50 mg by mouth at bedtime.     . ondansetron (ZOFRAN) 8 MG tablet Take 1 tablet (8 mg total) by mouth every 8 (eight) hours as needed for nausea or vomiting. 60 tablet 3  . oxyCODONE (OXY IR/ROXICODONE) 5 MG immediate release tablet Take 1-3 tablets (5-15 mg total) by mouth every 4 (four) hours as needed for moderate pain, severe pain or breakthrough pain. 30 tablet 0  . prochlorperazine (COMPAZINE) 10 MG tablet Take 1 tablet (10 mg total) by mouth every 6 (six) hours as needed for nausea or vomiting. 30 tablet 0  . traZODone (DESYREL) 100 MG tablet Take 200 mg by mouth at bedtime.      No current facility-administered medications for this visit.    REVIEW OF SYSTEMS:   Constitutional: Denies fevers, chills or abnormal night sweats Eyes: Denies blurriness of vision, double vision or watery eyes Ears, nose, mouth, throat, and face: Denies mucositis or sore throat Respiratory: Denies cough, dyspnea or wheezes Cardiovascular: Denies palpitation, chest discomfort or lower extremity swelling Gastrointestinal:  Denies nausea, heartburn or change in bowel habits, (+) colostomy bag Skin: (+) Denies abnormal skin rashes Lymphatics: Denies new lymphadenopathy or easy bruising Neurological:Denies numbness, tingling or new weaknesses Behavioral/Psych: Mood is stable, no new changes  All other systems were reviewed with the patient and are negative.  PHYSICAL EXAMINATION: ECOG PERFORMANCE STATUS: 1 - Symptomatic but completely ambulatory  Filed Vitals:   08/11/14 1459  BP: 137/77  Pulse: 85  Temp: 98 F (36.7 C)  Resp: 18   Filed Weights   08/11/14 1459  Weight: 195 lb 1.6 oz (88.497 kg)    GENERAL:alert, no distress and comfortable SKIN: skin color, texture, turgor are normal, healed rashes on her arms, legs, and trunk with skin pigmentation.  EYES: normal, conjunctiva are pink and non-injected, sclera clear OROPHARYNX:no exudate, no erythema and lips, buccal mucosa, and tongue normal  NECK: supple, thyroid normal size, non-tender, without nodularity LYMPH:  no palpable lymphadenopathy in the cervical, axillary or inguinal LUNGS: clear to auscultation and percussion with normal breathing effort HEART: regular rate & rhythm and no murmurs and no lower extremity edema ABDOMEN:abdomen soft, non-tender and normal bowel sounds. The perianal reconstruction site is much healed, with minimum clear discharge.  Musculoskeletal:no cyanosis of digits and no clubbing  PSYCH: alert & oriented x 3 with fluent speech NEURO: no focal motor/sensory deficits SKIN: multiple large  skin erythematous rashon his arms, thighs, and trunk, no ulcers or discharge.   LABORATORY DATA:  I have reviewed the data as listed Lab Results  Component Value Date   WBC 4.0 08/11/2014   HGB 11.7* 08/11/2014  HCT 33.9* 08/11/2014   MCV 85.4 08/11/2014   PLT 155 08/11/2014    Recent Labs  04/23/14 0545 04/24/14 0420 04/25/14 0505  07/08/14 1121 07/26/14 1217 08/11/14 1434  NA 136* 133* 138  < > 133* 134* 136  K 4.5 3.8 3.9  < > 4.2 4.2 4.3  CL 100 98 102  --   --   --   --   CO2 22 26 25   < > 24 22 24   GLUCOSE 299* 239* 250*  < > 177* 291* 298*  BUN 13 14 12   < > 18.3 12.8 16.3  CREATININE 0.89 0.86 0.74  < > 1.2 1.1 1.2  CALCIUM 8.0* 8.0* 7.9*  < > 8.6 8.9 9.0  GFRNONAA >90 >90 >90  --   --   --   --   GFRAA >90 >90 >90  --   --   --   --   PROT  --   --   --   < > 8.2 8.1 7.4  ALBUMIN  --   --   --   < > 3.7 3.5 3.6  AST  --   --   --   < > 22 21 14   ALT  --   --   --   < > 30 32 21  ALKPHOS  --   --   --   < > 103 129 88  BILITOT  --   --   --   < > <0.20 <0.20 <0.20  < > = values in this interval not displayed. PATHOLOGY REPORT 04/22/2014 Diagnosis 1. Soft tissue, biopsy, right pelvic sidewall; r/o malignancy - FIBROADIPOSE TISSUE, NO EVIDENCE OF MALIGNANCY. 2. Colon, segmental resection for tumor, rectum,sigmoid and anus - RECURRENT INVASIVE POORLY DIFFERENTIATED SQUAMOUS CELL CARCINOMA, INVADING THROUGH THE MUSCULARIS PROPRIA, INTO PERICOLONIC FATTY TISSUE, EXTENDING TO THE DEEP INKED MARGIN. - PERINEURAL INVASION AND ANGIOLYMPHATIC PRESENT. - SEVEN OF SEVENTEEN LYMPH NODES, POSITIVE FOR METASTATIC CARCINOMA (7/17) Microscopic Comment 2. Anus Specimen: Anus, rectum, sigmoid Procedure: Segmental resection Tumor site: Distal ends of specimen Specimen integrity: Intact Invasive tumor: Maximum size: At least 2.5 cm Histologic type(s): Invasive squamous cell carcinoma Histologic grade and differentiation: G2-3, moderately to poorly  differentiated Microscopic extension of invasive tumor: Invading through the muscularis propria into pericolonic fatty tissue involving adjacent skeletal muscle tissue Lymph-Vascular invasion: Present Peri-neural invasion: Present Resection margins: Deep margin is positive for tumor Treatment effect (neo-adjuvant therapy): Present, minimally Lymph nodes: number examined 17; number positive: 7 Pathologic Staging: ypT2, yN1 (R1) Ancillary studies: N/A  RADIOGRAPHIC STUDIES: I have personally reviewed the radiological images as listed and agreed with the findings in the report. Dg Cystogram Voiding 05/17/2014    IMPRESSION:  1. Mild degradation secondary to patient's inability to void fully.  2. Given this factor, normal appearance of the urethra.     PET/CT 01/19/2014 at [facility]  Negative for hypermetabolic lesion or metastatic disease   ASSESSMENT & PLAN:  54 years old male with past medical history of anal squamous cell carcinoma, status post surgical resection and concurrent chemoradiation in 2009, now has local recurrence status post APR in the reconstruction.ypT2, yN1 (R1), unfortunately his deep surgical margin was positive, and 7 out of 17 lymph nodes were positive also. No distant metastasis on the preop PET scan. His medical history is also complicated by diabetes, coronary artery disease status post stent placement, and HIV on treatment. Performance status 1.  1. Local recurrence of anal  squamous cell carcinoma, with positive surgical margines -Per rad/onc, he is not a candidate for re-irradiation  -I discussed that the role of chemotherapy is to control the residual disease after surgery. Chemotherapy alone is unlikely going to be curative although. -I offered him systemic chemotherapy with cisplatin and 5-FU regimen for total of 4-6 cycles. He has good PS and organ functions, his HIV is under good control, his recent CD4 is normal at 710. His recent cardiac stress  test was negative, and his cardiologist has cleared him for chemotherapy.  -he has had moderate side effects from the first and second cycle chemo: diarrhea, skin rash, mucositis and anorexia and tinnitus. Those are mostly resolved by now. I encouraged him to continue chemotherapy. He was digital hesitated but agreed to try cycle 3.  2. Diabetes -His blood sugar was close to 300 today, he has missed some insulin injections. He agrees to monitor his sugar closely at home and use insulin.   3. HIV  -Well controlled. Continue his treatment . His recent CD4 count was normal.   4. Hypertension and coronary artery disease  -He will continue follow-up with his primary care physician and cardiologist. Continue medications. I'll monitor his blood pressure, blood sugar during his chemotherapy. I discussed that we sometimes use steroids during chemotherapy treatment, which can cause hyperglycemia.   5. Urethra leakage -He now has his Foley removed, and is able to urinate without difficulty. -monitoring   6. depression -He denies suicidal. He is still quite depressed and frustrated about the side effects from chemotherapy, and colostomy bag. -We give him the contact info about the colostomy bag care nurse.  Plan --Return to clinic on 2/29 to start cycle 3 chemotherapy   Of note, his family members do not know his HIV status. I ensured him this will be kept confidential.   All questions were answered. The patient knows to call the clinic with any problems, questions or concerns.   I spent 20 minutes counseling the patient face to face. The total time spent in the appointment was 30 minutes and more than 50% was on counseling.     Truitt Merle, MD 08/11/2014 3:32 PM

## 2014-08-13 ENCOUNTER — Telehealth: Payer: Self-pay | Admitting: *Deleted

## 2014-08-13 NOTE — Telephone Encounter (Signed)
Per staff message and POF I have scheduled appts. Advised scheduler of appts. JMW  

## 2014-08-15 ENCOUNTER — Encounter: Payer: Self-pay | Admitting: Hematology

## 2014-08-23 ENCOUNTER — Ambulatory Visit (HOSPITAL_BASED_OUTPATIENT_CLINIC_OR_DEPARTMENT_OTHER): Payer: Private Health Insurance - Indemnity

## 2014-08-23 ENCOUNTER — Encounter: Payer: Private Health Insurance - Indemnity | Admitting: Nutrition

## 2014-08-23 ENCOUNTER — Other Ambulatory Visit (HOSPITAL_BASED_OUTPATIENT_CLINIC_OR_DEPARTMENT_OTHER): Payer: Private Health Insurance - Indemnity

## 2014-08-23 ENCOUNTER — Telehealth: Payer: Self-pay | Admitting: *Deleted

## 2014-08-23 ENCOUNTER — Encounter: Payer: Self-pay | Admitting: Hematology

## 2014-08-23 ENCOUNTER — Ambulatory Visit (HOSPITAL_BASED_OUTPATIENT_CLINIC_OR_DEPARTMENT_OTHER): Payer: Private Health Insurance - Indemnity | Admitting: Hematology

## 2014-08-23 ENCOUNTER — Ambulatory Visit: Payer: Private Health Insurance - Indemnity | Admitting: Nutrition

## 2014-08-23 VITALS — BP 148/87 | HR 94 | Temp 98.2°F | Resp 18 | Ht 71.0 in | Wt 196.8 lb

## 2014-08-23 DIAGNOSIS — C211 Malignant neoplasm of anal canal: Secondary | ICD-10-CM

## 2014-08-23 DIAGNOSIS — Z5111 Encounter for antineoplastic chemotherapy: Secondary | ICD-10-CM

## 2014-08-23 DIAGNOSIS — B20 Human immunodeficiency virus [HIV] disease: Secondary | ICD-10-CM

## 2014-08-23 DIAGNOSIS — F329 Major depressive disorder, single episode, unspecified: Secondary | ICD-10-CM

## 2014-08-23 DIAGNOSIS — C210 Malignant neoplasm of anus, unspecified: Secondary | ICD-10-CM

## 2014-08-23 DIAGNOSIS — E119 Type 2 diabetes mellitus without complications: Secondary | ICD-10-CM

## 2014-08-23 LAB — COMPREHENSIVE METABOLIC PANEL (CC13)
ALT: 22 U/L (ref 0–55)
AST: 20 U/L (ref 5–34)
Albumin: 3.7 g/dL (ref 3.5–5.0)
Alkaline Phosphatase: 84 U/L (ref 40–150)
Anion Gap: 7 mEq/L (ref 3–11)
BUN: 15.6 mg/dL (ref 7.0–26.0)
CO2: 24 meq/L (ref 22–29)
CREATININE: 1.1 mg/dL (ref 0.7–1.3)
Calcium: 9.3 mg/dL (ref 8.4–10.4)
Chloride: 104 mEq/L (ref 98–109)
EGFR: 75 mL/min/{1.73_m2} — AB (ref >90)
Glucose: 234 mg/dl — ABNORMAL HIGH (ref 70–140)
Potassium: 4.5 mEq/L (ref 3.5–5.1)
SODIUM: 135 meq/L — AB (ref 136–145)
Total Protein: 7.8 g/dL (ref 6.4–8.3)

## 2014-08-23 LAB — CBC WITH DIFFERENTIAL/PLATELET
BASO%: 0.8 % (ref 0.0–2.0)
Basophils Absolute: 0 10*3/uL (ref 0.0–0.1)
EOS%: 1.5 % (ref 0.0–7.0)
Eosinophils Absolute: 0.1 10*3/uL (ref 0.0–0.5)
HCT: 36.4 % — ABNORMAL LOW (ref 38.4–49.9)
HGB: 11.9 g/dL — ABNORMAL LOW (ref 13.0–17.1)
LYMPH#: 2.2 10*3/uL (ref 0.9–3.3)
LYMPH%: 56.7 % — ABNORMAL HIGH (ref 14.0–49.0)
MCH: 29 pg (ref 27.2–33.4)
MCHC: 32.8 g/dL (ref 32.0–36.0)
MCV: 88.5 fL (ref 79.3–98.0)
MONO#: 0.5 10*3/uL (ref 0.1–0.9)
MONO%: 12.2 % (ref 0.0–14.0)
NEUT#: 1.1 10*3/uL — ABNORMAL LOW (ref 1.5–6.5)
NEUT%: 28.8 % — ABNORMAL LOW (ref 39.0–75.0)
Platelets: 204 10*3/uL (ref 140–400)
RBC: 4.11 10*6/uL — ABNORMAL LOW (ref 4.20–5.82)
RDW: 18 % — AB (ref 11.0–14.6)
WBC: 3.8 10*3/uL — ABNORMAL LOW (ref 4.0–10.3)

## 2014-08-23 MED ORDER — FLUOROURACIL CHEMO INJECTION 5 GM/100ML
750.0000 mg/m2/d | INTRAVENOUS | Status: DC
  Administered 2014-08-23: 7900 mg via INTRAVENOUS
  Filled 2014-08-23: qty 158

## 2014-08-23 MED ORDER — DEXAMETHASONE 4 MG PO TABS: 4.0000 mg | ORAL_TABLET | Freq: Two times a day (BID) | ORAL | Status: DC

## 2014-08-23 MED ORDER — SODIUM CHLORIDE 0.9 % IV SOLN: Freq: Once | INTRAVENOUS | Status: DC

## 2014-08-23 NOTE — Patient Instructions (Signed)
[facility] Discharge Instructions for Patients Receiving Chemotherapy  Today you received the following chemotherapy agents 5FU  To help prevent nausea and vomiting after your treatment, we encourage you to take your nausea medication as directed   If you develop nausea and vomiting that is not controlled by your nausea medication, call the clinic.   BELOW ARE SYMPTOMS THAT SHOULD BE REPORTED IMMEDIATELY:  *FEVER GREATER THAN 100.5 F  *CHILLS WITH OR WITHOUT FEVER  NAUSEA AND VOMITING THAT IS NOT CONTROLLED WITH YOUR NAUSEA MEDICATION  *UNUSUAL SHORTNESS OF BREATH  *UNUSUAL BRUISING OR BLEEDING  TENDERNESS IN MOUTH AND THROAT WITH OR WITHOUT PRESENCE OF ULCERS  *URINARY PROBLEMS  *BOWEL PROBLEMS  UNUSUAL RASH Items with * indicate a potential emergency and should be followed up as soon as possible.  Feel free to call the clinic you have any questions or concerns. The clinic phone number is [phone].    

## 2014-08-23 NOTE — Progress Notes (Signed)
[facility] NOTE  Patient Care Team: [name], MD as PCP - General (Infectious Diseases) Robert Bellow, MD (General Surgery) Leighton Ruff, MD as Consulting Physician (General Surgery) Irene Limbo, MD as Consulting Physician (Plastic Surgery) Truitt Merle, MD as Consulting Physician (Hematology) Festus Aloe, MD as Consulting Physician (Urology)  DIAGNOSIS Recurrent anal cancer    Primary cancer of anal canal   02/05/2011 Initial Diagnosis Primary cancer of anal canal, T1N0M0, s/p surgical resection with positive margines. He received definitive dose RT 45 Gy over 5 weeks with 16Gy boosting and concurrent chemo with 5FU, treated at [facility] by Drs.  Geoffreyh and [employer].    11/23/2013 Relapse/Recurrence local recurrence    04/21/2014 Surgery abdominal perianal resection (APR) and VRAM flap closure of perineal wound on 04/21/14, surgical margins were positive for cancer      OTHER RELATED ISSUES: 1. (+) HIV, on therapy with good control  2. CAD, s/p stent placement in 1998  3. Urine leakage after surgery on 04/21/14, has a foley in place now   CURRENT THERAPY: 5-FU 1000mg /m2/day on D1-5, ciplatin 100mg /m2 on D2, every 28 days, started on 06/28/2014  INTERIM HISTORY: Julian Palmer returns for follow up after third cycle chemo. He feels better lately. He worked for time last week and tolerated well, he has better appetite and energy level lately. He denies any other new symptoms. No diarrhea, nausea or constipation. No fever or chills. His skin rash has much improved, no itchiness or skin breakdown. No significant neuropathy or hearing loss.   MEDICAL HISTORY:  Past Medical History  Diagnosis Date  . S/p bare metal coronary artery stent     1998  pLAD  . Type 2 diabetes mellitus   . Hypertension   . 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 Hyperlipidemia   . History of carcinoma in situ of anal canal     2010  . ED (erectile dysfunction)   . Sigmoid diverticulosis   . PONV (postoperative nausea and vomiting)   . Wears glasses   . At risk for sleep apnea     STOP-BANG= 4     SENT TO PCP 03-23-2014  . Coronary artery disease CARDIOLOGIST--  DR Rockey Situ --  LAST LOV 04-17-2013    S/P  PCI to mid and distal LAD and BM stent x1 to pLAD  . Anxiety   . Depression   . HIV positive   . Recurrent squamous cell carcinoma of anus     removal mass 01-08-2014  . Primary cancer of anal canal 05/18/2011    SURGICAL HISTORY: Past Surgical History  Procedure Laterality Date  . Septoplasty  1985  . Sphincterotomy  2009  . Cardiovascular stress test  04/ 2010    low risk study/  very small region of mildly reduced perfersion is mildly reversible dLAD territory  . Resection anal mass  02-05-2011  . Excision anal polyp  2009  . Re-excision anal squamous cell carcinoma  2010  . Removal rectal mass  01-08-2014  . Eua w/ bx perianal skin  06-08-2009  . Port-a-cath placement  02-21-2011    REMOVED  2013  . Coronary angioplasty with stent placement  1998   [facility]    BM stent to pLAD and PCI to Mid and Distal LAD/  no other  significant cad  . Inguinal hernia repair Bilateral 1988  . Colonoscopy with propofol  05-28-2008  . Mass excision N/A 03/26/2014    Procedure: ANAL EXAM UNDER ANESTHESIA,EXCISIONAL BIOPSY OF ANAL MASS;  Surgeon: Leighton Ruff, MD;  Location: [facility];  Service: General;  Laterality: N/A;  . Laparoscopic assisted abdominal perineal resection N/A 04/22/2014    Procedure: LAPAROSCOPIC  ABDOMINAL PERINEAL RESECTION, ;  Surgeon: Leighton Ruff, MD;  Location: [facility];  Service: General;  Laterality: N/A;  . Urethrotomy Bilateral 04/22/2014    Procedure: CYSTOSCOPY/URETHROTOMY;  Surgeon: Leighton Ruff, MD;  Location: [facility];  Service: General;  Laterality:  Bilateral;  . Muscle flap closure N/A 04/22/2014    Procedure: VRAM FLAP;  Surgeon: Irene Limbo, MD;  Location: [facility];  Service: Plastics;  Laterality: N/A;  . Portacath placement Left 06/24/2014    Procedure: INSERTION PORT-A-CATH LEFT SUBCLAVIAN;  Surgeon: Leighton Ruff, MD;  Location: [facility];  Service: General;  Laterality: Left;    SOCIAL HISTORY: History   Social History  . Marital Status: Single    Spouse Name: N/A  . Number of Children: 0  . Years of Education: N/A   Occupational History  .  [place] History Main Topics  . Smoking status: Never Smoker   . Smokeless tobacco: Never Used  . Alcohol Use: No     Comment: RARE  . Drug Use: No  . Sexual Activity: Not on file   Other Topics Concern  . Not on file   Social History Narrative   Full time. Single. Does not regularly exercise.     FAMILY HISTORY: Family History  Problem Relation Age of Onset  . Diabetes type II Mother   . Breast cancer Mother 77     survivor  . Hypertension Mother   . Coronary artery disease Father   Paternal GM had utrine cancer, no other history of malignancy   ALLERGIES:  is allergic to sulfa antibiotics.  MEDICATIONS:  Current Outpatient Prescriptions  Medication Sig Dispense Refill  . aspirin (ASPIR-81) 81 MG EC tablet Take 81 mg by mouth at bedtime.     Marland Kitchen atorvastatin (LIPITOR) 80 MG tablet Take 80 mg by mouth every evening.    . DPH-Lido-AlHydr-MgHydr-Simeth (FIRST-MOUTHWASH BLM) SUSP Swish and swallow 5 mLs 4 (four) times daily as needed.  0  . efavirenz-emtrictabine-tenofovir (ATRIPLA) 600-200-300 MG per tablet Take 1 tablet by mouth at bedtime.     Marland Kitchen glimepiride (AMARYL) 2 MG tablet Take 4 mg by mouth 2 (two) times daily.     Marland Kitchen glucose blood ([employer]S BLOOD GLUCOSE TEST STRIPS) test strip 1 each by Other route 2 (two) times daily.     . insulin glargine (LANTUS) 100 UNIT/ML injection Inject 0.25 mLs (25 Units total) into the skin at bedtime. (Patient  taking differently: Inject 25 Units into the skin at bedtime as needed (blood sugar.). For Levels above 110) 10 mL 2  . Lancet Devices ([employer]S LANCING DEVICE) MISC 1 each by Other route 2 (two) times daily. Use 1 each once daily. Use as instructed.    . lidocaine-prilocaine (EMLA) cream Apply 1 application topically as needed. Apply to portacath 1 1/2 hours to 2 hours prior to procedures as needed. 30 g 1  . losartan (COZAAR) 100 MG tablet Take 100 mg by mouth every evening.    . metFORMIN (GLUCOPHAGE) 1000 MG tablet Take 1,000 mg by mouth 2 (two) times daily.      . Multiple  Vitamins-Minerals (MULTIVITAL) tablet Take 1 tablet by mouth every evening.     . niacin (NIASPAN) 1000 MG CR tablet Take 1,000 mg by mouth at bedtime.    . ondansetron (ZOFRAN) 8 MG tablet Take 1 tablet (8 mg total) by mouth every 8 (eight) hours as needed for nausea or vomiting. 60 tablet 3  . oxyCODONE (OXY IR/ROXICODONE) 5 MG immediate release tablet Take 1-3 tablets (5-15 mg total) by mouth every 4 (four) hours as needed for moderate pain, severe pain or breakthrough pain. 30 tablet 0  . prochlorperazine (COMPAZINE) 10 MG tablet Take 1 tablet (10 mg total) by mouth every 6 (six) hours as needed for nausea or vomiting. 30 tablet 0  . Alum & Mag Hydroxide-Simeth (MAGIC MOUTHWASH) SOLN Take 5 mLs by mouth 4 (four) times daily as needed for mouth pain (Nystatin, Lidocaine, Maalox, and Benadryl  ingredients.). (Patient not taking: Reported on 08/23/2014) 240 mL 0  . citalopram (CELEXA) 40 MG tablet Take 40 mg by mouth every morning.    Marland Kitchen dexamethasone (DECADRON) 4 MG tablet Take 1 tablet (4 mg total) by mouth 2 (two) times daily with a meal. 7 tablet 1  . HYDROCORTISONE, TOPICAL, 2 % LOTN Apply 1 application topically 2 (two) times daily. (Patient not taking: Reported on 08/23/2014) 1 Bottle 1  . nitrofurantoin (MACRODANTIN) 50 MG capsule Take 50 mg by mouth at bedtime.      No current facility-administered medications for this  visit.    REVIEW OF SYSTEMS:   Constitutional: Denies fevers, chills or abnormal night sweats Eyes: Denies blurriness of vision, double vision or watery eyes Ears, nose, mouth, throat, and face: Denies mucositis or sore throat Respiratory: Denies cough, dyspnea or wheezes Cardiovascular: Denies palpitation, chest discomfort or lower extremity swelling Gastrointestinal:  Denies nausea, heartburn or change in bowel habits, (+) colostomy bag Skin: (+)  abnormal skin rashes Lymphatics: Denies new lymphadenopathy or easy bruising Neurological:Denies numbness, tingling or new weaknesses Behavioral/Psych: Mood is stable, no new changes  All other systems were reviewed with the patient and are negative.  PHYSICAL EXAMINATION: ECOG PERFORMANCE STATUS: 1 - Symptomatic but completely ambulatory  Filed Vitals:   08/23/14 1030  BP: 148/87  Pulse: 94  Temp: 98.2 F (36.8 C)  Resp: 18   Filed Weights   08/23/14 1030  Weight: 196 lb 12.8 oz (89.268 kg)    GENERAL:alert, no distress and comfortable SKIN: skin color, texture, turgor are normal, healed rashes on her arms, legs, and trunk with skin pigmentation.  EYES: normal, conjunctiva are pink and non-injected, sclera clear OROPHARYNX:no exudate, no erythema and lips, buccal mucosa, and tongue normal  NECK: supple, thyroid normal size, non-tender, without nodularity LYMPH:  no palpable lymphadenopathy in the cervical, axillary or inguinal LUNGS: clear to auscultation and percussion with normal breathing effort HEART: regular rate & rhythm and no murmurs and no lower extremity edema ABDOMEN:abdomen soft, non-tender and normal bowel sounds. The perianal reconstruction site is much healed, with minimum clear discharge.  Musculoskeletal:no cyanosis of digits and no clubbing  PSYCH: alert & oriented x 3 with fluent speech NEURO: no focal motor/sensory deficits SKIN: multiple large skin erythematous rashon his arms, thighs, and trunk, no ulcers  or discharge.   LABORATORY DATA:  I have reviewed the data as listed Lab Results  Component Value Date   WBC 3.8* 08/23/2014   HGB 11.9* 08/23/2014   HCT 36.4* 08/23/2014   MCV 88.5 08/23/2014   PLT 204 08/23/2014    Recent Labs  04/23/14 0545 04/24/14 0420 04/25/14 0505  07/26/14 1217 08/11/14 1434 08/23/14 1013  NA 136* 133* 138  < > 134* 136 135*  K 4.5 3.8 3.9  < > 4.2 4.3 4.5  CL 100 98 102  --   --   --   --   CO2 22 26 25   < > 22 24 24   GLUCOSE 299* 239* 250*  < > 291* 298* 234*  BUN 13 14 12   < > 12.8 16.3 15.6  CREATININE 0.89 0.86 0.74  < > 1.1 1.2 1.1  CALCIUM 8.0* 8.0* 7.9*  < > 8.9 9.0 9.3  GFRNONAA >90 >90 >90  --   --   --   --   GFRAA >90 >90 >90  --   --   --   --   PROT  --   --   --   < > 8.1 7.4 7.8  ALBUMIN  --   --   --   < > 3.5 3.6 3.7  AST  --   --   --   < > 21 14 20   ALT  --   --   --   < > 32 21 22  ALKPHOS  --   --   --   < > 129 88 84  BILITOT  --   --   --   < > <0.20 <0.20 <0.20  < > = values in this interval not displayed. PATHOLOGY REPORT 04/22/2014 Diagnosis 1. Soft tissue, biopsy, right pelvic sidewall; r/o malignancy - FIBROADIPOSE TISSUE, NO EVIDENCE OF MALIGNANCY. 2. Colon, segmental resection for tumor, rectum,sigmoid and anus - RECURRENT INVASIVE POORLY DIFFERENTIATED SQUAMOUS CELL CARCINOMA, INVADING THROUGH THE MUSCULARIS PROPRIA, INTO PERICOLONIC FATTY TISSUE, EXTENDING TO THE DEEP INKED MARGIN. - PERINEURAL INVASION AND ANGIOLYMPHATIC PRESENT. - SEVEN OF SEVENTEEN LYMPH NODES, POSITIVE FOR METASTATIC CARCINOMA (7/17) Microscopic Comment 2. Anus Specimen: Anus, rectum, sigmoid Procedure: Segmental resection Tumor site: Distal ends of specimen Specimen integrity: Intact Invasive tumor: Maximum size: At least 2.5 cm Histologic type(s): Invasive squamous cell carcinoma Histologic grade and differentiation: G2-3, moderately to poorly differentiated Microscopic extension of invasive tumor: Invading through the muscularis  propria into pericolonic fatty tissue involving adjacent skeletal muscle tissue Lymph-Vascular invasion: Present Peri-neural invasion: Present Resection margins: Deep margin is positive for tumor Treatment effect (neo-adjuvant therapy): Present, minimally Lymph nodes: number examined 17; number positive: 7 Pathologic Staging: ypT2, yN1 (R1) Ancillary studies: N/A  RADIOGRAPHIC STUDIES: I have personally reviewed the radiological images as listed and agreed with the findings in the report. Dg Cystogram Voiding 05/17/2014    IMPRESSION:  1. Mild degradation secondary to patient's inability to void fully.  2. Given this factor, normal appearance of the urethra.     PET[facility] 01/19/2014 [facility]  Negative for hypermetabolic lesion or metastatic disease   ASSESSMENT & PLAN:  54 years old male with past medical history of anal squamous cell carcinoma, status post surgical resection and concurrent chemoradiation in 2009, now has local recurrence status post APR in the reconstruction.ypT2, yN1 (R1), unfortunately his deep surgical margin was positive, and 7 out of 17 lymph nodes were positive also. No distant metastasis on the preop PET scan. His medical history is also complicated by diabetes, coronary artery disease status post stent placement, and HIV on treatment. Performance status 1.  1. Local recurrence of anal squamous cell carcinoma, with positive surgical margines -Per rad/onc, he is not a candidate for re-irradiation  -I discussed  that the role of chemotherapy is to control the residual disease after surgery. Chemotherapy alone is unlikely going to be curative although. -I offered him systemic chemotherapy with cisplatin and 5-FU regimen for total of 4-6 cycles. He has good PS and organ functions, his HIV is under good control, his recent CD4 is normal at 710. His recent cardiac stress test was negative, and his cardiologist has cleared him for chemotherapy.  -he has had  moderate side effects from the first and second cycle chemo: diarrhea, skin rash, mucositis and anorexia and tinnitus. Those are mostly resolved by now. I encouraged him to continue chemotherapy.  -He is mildly itchy [place] with ANC 1.1 today, clinically doing well. I'll proceed cycle 3 chemotherapy today with 30% 5-FU dose reduction this cycle (15% dose reduction with cycle 2.), And give Neulasta injection on day 6 when he disconnect the 5-FU pump.  2. Diabetes -His blood sugar is not very well controlled, he has missed some insulin injections. He agrees to monitor his sugar closely at home and use insulin.   3. HIV  -Well controlled. Continue his treatment . His recent CD4 count was normal.   4. Hypertension and coronary artery disease  -He will continue follow-up with his primary care physician and cardiologist. Continue medications. I'll monitor his blood pressure, blood sugar during his chemotherapy. I discussed that we sometimes use steroids during chemotherapy treatment, which can cause hyperglycemia.   5. Urethra leakage -He now has his Foley removed, and is able to urinate without difficulty. -monitoring   6. depression -He denies suicidal. He is still quite depressed and frustrated about the side effects from chemotherapy, and colostomy bag. -His mood is quite good today. He is looking forward to making a trip to [place] in July for a country music concert.  Plan -Cycle 3 chemotherapy today, with 30% 5-FU dose reduction, continued cisplatin the same dose. -Neulasta on day 6 with pump disconnection -He will check his CBC with differential on March 14 and March 24 at the local lab. He will call me for the results after blood drawn. -We'll schedule his cycle 4 chemotherapy in 4 weeks.  Of note, his family members do not know his HIV status. I ensured him this will be kept confidential.   All questions were answered. The patient knows to call the clinic with any problems,  questions or concerns.   I spent 25 minutes counseling the patient face to face. The total time spent in the appointment was 30 minutes and more than 50% was on counseling.     Truitt Merle, MD 08/23/2014 12:01 PM

## 2014-08-23 NOTE — Telephone Encounter (Signed)
Per staff message and POF I have scheduled appts. Advised scheduler of appts. JMW  

## 2014-08-23 NOTE — Progress Notes (Signed)
Per Dr. Burr Medico - OK to treat today with ANC 1.1 with dose reduction.  Pt to receive Neulasta on Saturday.

## 2014-08-23 NOTE — Progress Notes (Signed)
Nutrition follow-up completed with patient who is receiving treatment for recurrent anal cancer.  Patient reports he feels much better. Reports his mood has also improved. His appetite is improved. Weight has increased and was documented as 196.8 pounds February 29 up from 192.3 pounds February 1. Colostomy output is satisfactory. Patient monitoring blood sugar every evening and evening and giving Lantus per physician instruction.  Nutrition diagnosis: Unintended weight loss improved.  Intervention: Patient educated to continue small frequent meals utilizing high protein foods to  minimize weight loss. Patient will continue carbohydrate controlled diet for adequate blood sugar control. Recommended patient increase water intake to a minimum of 8-10 - 8 ounce glasses daily. Questions were answered.  Teach back method used.  Monitoring, evaluation, goals: Patient will continue to increase oral intake to promote weight maintenance and minimize weight loss.   Next visit: Tuesday, March 29, during chemotherapy.   **Disclaimer: This note was dictated with voice recognition software. Similar sounding words can inadvertently be transcribed and this note may contain transcription errors which may not have been corrected upon publication of note.**

## 2014-08-24 ENCOUNTER — Encounter: Payer: Self-pay | Admitting: *Deleted

## 2014-08-24 ENCOUNTER — Ambulatory Visit (HOSPITAL_BASED_OUTPATIENT_CLINIC_OR_DEPARTMENT_OTHER): Payer: Private Health Insurance - Indemnity

## 2014-08-24 DIAGNOSIS — Z5111 Encounter for antineoplastic chemotherapy: Secondary | ICD-10-CM

## 2014-08-24 DIAGNOSIS — C210 Malignant neoplasm of anus, unspecified: Secondary | ICD-10-CM

## 2014-08-24 DIAGNOSIS — R112 Nausea with vomiting, unspecified: Secondary | ICD-10-CM

## 2014-08-24 DIAGNOSIS — C211 Malignant neoplasm of anal canal: Secondary | ICD-10-CM

## 2014-08-24 MED ORDER — PROCHLORPERAZINE MALEATE 10 MG PO TABS: 10.0000 mg | ORAL_TABLET | Freq: Four times a day (QID) | ORAL | Status: DC | PRN

## 2014-08-24 MED ORDER — SODIUM CHLORIDE 0.9 % IV SOLN
150.0000 mg | Freq: Once | INTRAVENOUS | Status: AC
  Administered 2014-08-24: 150 mg via INTRAVENOUS
  Filled 2014-08-24: qty 5

## 2014-08-24 MED ORDER — DEXAMETHASONE SODIUM PHOSPHATE 20 MG/5ML IJ SOLN
12.0000 mg | Freq: Once | INTRAMUSCULAR | Status: AC
  Administered 2014-08-24: 12 mg via INTRAVENOUS

## 2014-08-24 MED ORDER — SODIUM CHLORIDE 0.9 % IV SOLN
Freq: Once | INTRAVENOUS | Status: AC
  Administered 2014-08-24: 09:00:00 via INTRAVENOUS

## 2014-08-24 MED ORDER — CISPLATIN CHEMO INJECTION 100MG/100ML
95.0000 mg/m2 | Freq: Once | INTRAVENOUS | Status: AC
  Administered 2014-08-24: 200 mg via INTRAVENOUS
  Filled 2014-08-24: qty 200

## 2014-08-24 MED ORDER — PALONOSETRON HCL INJECTION 0.25 MG/5ML
0.2500 mg | Freq: Once | INTRAVENOUS | Status: AC
  Administered 2014-08-24: 0.25 mg via INTRAVENOUS

## 2014-08-24 MED ORDER — PALONOSETRON HCL INJECTION 0.25 MG/5ML
INTRAVENOUS | Status: AC
  Filled 2014-08-24: qty 5

## 2014-08-24 MED ORDER — DEXAMETHASONE SODIUM PHOSPHATE 20 MG/5ML IJ SOLN
INTRAMUSCULAR | Status: AC
  Filled 2014-08-24: qty 5

## 2014-08-24 MED ORDER — POTASSIUM CHLORIDE 2 MEQ/ML IV SOLN
Freq: Once | INTRAVENOUS | Status: AC
  Administered 2014-08-24: 09:00:00 via INTRAVENOUS
  Filled 2014-08-24: qty 10

## 2014-08-24 NOTE — Patient Instructions (Signed)
[facility] Discharge Instructions for Patients Receiving Chemotherapy  Today you received the following chemotherapy agents cisplatin.    To help prevent nausea and vomiting after your treatment, we encourage you to take your nausea medication as directed.     If you develop nausea and vomiting that is not controlled by your nausea medication, call the clinic.   BELOW ARE SYMPTOMS THAT SHOULD BE REPORTED IMMEDIATELY:  *FEVER GREATER THAN 100.5 F  *CHILLS WITH OR WITHOUT FEVER  NAUSEA AND VOMITING THAT IS NOT CONTROLLED WITH YOUR NAUSEA MEDICATION  *UNUSUAL SHORTNESS OF BREATH  *UNUSUAL BRUISING OR BLEEDING  TENDERNESS IN MOUTH AND THROAT WITH OR WITHOUT PRESENCE OF ULCERS  *URINARY PROBLEMS  *BOWEL PROBLEMS  UNUSUAL RASH Items with * indicate a potential emergency and should be followed up as soon as possible.  Feel free to call the clinic you have any questions or concerns. The clinic phone number is [phone].  

## 2014-08-24 NOTE — [facility] Oncology Navigator Note (Signed)
Met with patient in infusion area today. He reports he is feeling better today, emotionally and physically. Was able to work every day last week and is grateful for this. Discussed the addition of Neulasta to his regimen with the pump D/C on Saturday. Provided Clinical Key handout and revieweded. He is able to verbalize need for injection and most common side effects. Will take Claritin daily X 5 days starting day prior to injection to help minimize bone pain. Confirmed with him that his dexamethasone was renewed yesterday and compazine was renewed today. Denies any other needs at this time.

## 2014-08-28 ENCOUNTER — Ambulatory Visit (HOSPITAL_BASED_OUTPATIENT_CLINIC_OR_DEPARTMENT_OTHER): Payer: Private Health Insurance - Indemnity

## 2014-08-28 DIAGNOSIS — Z5189 Encounter for other specified aftercare: Secondary | ICD-10-CM

## 2014-08-28 DIAGNOSIS — C211 Malignant neoplasm of anal canal: Secondary | ICD-10-CM

## 2014-08-28 DIAGNOSIS — C210 Malignant neoplasm of anus, unspecified: Secondary | ICD-10-CM

## 2014-08-28 MED ORDER — SODIUM CHLORIDE 0.9 % IJ SOLN
10.0000 mL | INTRAMUSCULAR | Status: DC | PRN
  Administered 2014-08-28: 10 mL
  Filled 2014-08-28: qty 10

## 2014-08-28 MED ORDER — HEPARIN SOD (PORK) LOCK FLUSH 100 UNIT/ML IV SOLN
500.0000 [IU] | Freq: Once | INTRAVENOUS | Status: AC | PRN
  Administered 2014-08-28: 500 [IU]
  Filled 2014-08-28: qty 5

## 2014-08-28 MED ORDER — PEGFILGRASTIM INJECTION 6 MG/0.6ML ~~LOC~~
6.0000 mg | PREFILLED_SYRINGE | Freq: Once | SUBCUTANEOUS | Status: AC
  Administered 2014-08-28: 6 mg via SUBCUTANEOUS

## 2014-08-28 NOTE — Patient Instructions (Signed)

## 2014-09-16 ENCOUNTER — Telehealth: Payer: Self-pay | Admitting: *Deleted

## 2014-09-16 NOTE — Telephone Encounter (Signed)
[name] called to ask if he would have to have labs prior to treatment next week.  Advised him that he would.  He has had labs done between treatments at [facility], but these were to follow his mild neutropenia.  Advised him he will need a CBC and CMET on the day of treatment.  He was okay with that.  He reported pain in the vein of his left forearm beginning where the IV catheter was inserted on 08/24/14 and up to his elbow.  He denies any redness, swelling, blisters, or skin breakdown in the area.  He will try Aleve bid.  Advised him to inform the infusion nurse on Monday about the sore vessel, so that can be avoided.  He agreed with plan.

## 2014-09-18 ENCOUNTER — Other Ambulatory Visit: Payer: Self-pay | Admitting: Hematology

## 2014-09-20 ENCOUNTER — Ambulatory Visit (HOSPITAL_BASED_OUTPATIENT_CLINIC_OR_DEPARTMENT_OTHER): Payer: Managed Care, Other (non HMO) | Admitting: Oncology

## 2014-09-20 ENCOUNTER — Ambulatory Visit (HOSPITAL_BASED_OUTPATIENT_CLINIC_OR_DEPARTMENT_OTHER): Payer: Managed Care, Other (non HMO)

## 2014-09-20 ENCOUNTER — Other Ambulatory Visit (HOSPITAL_BASED_OUTPATIENT_CLINIC_OR_DEPARTMENT_OTHER): Payer: Managed Care, Other (non HMO)

## 2014-09-20 ENCOUNTER — Encounter: Payer: Self-pay | Admitting: Oncology

## 2014-09-20 VITALS — BP 131/85 | HR 91 | Temp 97.8°F | Resp 18 | Ht 71.0 in | Wt 201.7 lb

## 2014-09-20 DIAGNOSIS — Z5111 Encounter for antineoplastic chemotherapy: Secondary | ICD-10-CM | POA: Diagnosis not present

## 2014-09-20 DIAGNOSIS — E119 Type 2 diabetes mellitus without complications: Secondary | ICD-10-CM | POA: Diagnosis not present

## 2014-09-20 DIAGNOSIS — C211 Malignant neoplasm of anal canal: Secondary | ICD-10-CM

## 2014-09-20 DIAGNOSIS — C210 Malignant neoplasm of anus, unspecified: Secondary | ICD-10-CM | POA: Diagnosis not present

## 2014-09-20 DIAGNOSIS — B20 Human immunodeficiency virus [HIV] disease: Secondary | ICD-10-CM | POA: Diagnosis not present

## 2014-09-20 DIAGNOSIS — I2510 Atherosclerotic heart disease of native coronary artery without angina pectoris: Secondary | ICD-10-CM | POA: Diagnosis not present

## 2014-09-20 LAB — COMPREHENSIVE METABOLIC PANEL (CC13)
ALBUMIN: 4.1 g/dL (ref 3.5–5.0)
ALK PHOS: 104 U/L (ref 40–150)
ALT: 23 U/L (ref 0–55)
AST: 16 U/L (ref 5–34)
Anion Gap: 10 mEq/L (ref 3–11)
BUN: 24.2 mg/dL (ref 7.0–26.0)
CO2: 21 mEq/L — ABNORMAL LOW (ref 22–29)
Calcium: 9.4 mg/dL (ref 8.4–10.4)
Chloride: 104 mEq/L (ref 98–109)
Creatinine: 1.3 mg/dL (ref 0.7–1.3)
EGFR: 64 mL/min/{1.73_m2} — ABNORMAL LOW (ref >90)
Glucose: 325 mg/dl — ABNORMAL HIGH (ref 70–140)
Potassium: 4.4 mEq/L (ref 3.5–5.1)
SODIUM: 136 meq/L (ref 136–145)
Total Bilirubin: 0.25 mg/dL (ref 0.20–1.20)
Total Protein: 8.4 g/dL — ABNORMAL HIGH (ref 6.4–8.3)

## 2014-09-20 LAB — CBC WITH DIFFERENTIAL/PLATELET
BASO%: 0.3 % (ref 0.0–2.0)
Basophils Absolute: 0 10*3/uL (ref 0.0–0.1)
EOS%: 1.4 % (ref 0.0–7.0)
Eosinophils Absolute: 0.1 10*3/uL (ref 0.0–0.5)
HCT: 33.9 % — ABNORMAL LOW (ref 38.4–49.9)
HGB: 11.5 g/dL — ABNORMAL LOW (ref 13.0–17.1)
LYMPH#: 1.8 10*3/uL (ref 0.9–3.3)
LYMPH%: 28.8 % (ref 14.0–49.0)
MCH: 31.3 pg (ref 27.2–33.4)
MCHC: 33.9 g/dL (ref 32.0–36.0)
MCV: 92.4 fL (ref 79.3–98.0)
MONO#: 0.6 10*3/uL (ref 0.1–0.9)
MONO%: 9.3 % (ref 0.0–14.0)
NEUT#: 3.7 10*3/uL (ref 1.5–6.5)
NEUT%: 60.2 % (ref 39.0–75.0)
PLATELETS: 257 10*3/uL (ref 140–400)
RBC: 3.67 10*6/uL — AB (ref 4.20–5.82)
RDW: 20.4 % — ABNORMAL HIGH (ref 11.0–14.6)
WBC: 6.2 10*3/uL (ref 4.0–10.3)

## 2014-09-20 LAB — MAGNESIUM (CC13): Magnesium: 2.1 mg/dl (ref 1.5–2.5)

## 2014-09-20 MED ORDER — SODIUM CHLORIDE 0.9 % IV SOLN
750.0000 mg/m2/d | INTRAVENOUS | Status: DC
  Administered 2014-09-20: 7900 mg via INTRAVENOUS
  Filled 2014-09-20: qty 158

## 2014-09-20 NOTE — Patient Instructions (Signed)
[facility] Discharge Instructions for Patients Receiving Chemotherapy  Today you received the following chemotherapy agents 5fu To help prevent nausea and vomiting after your treatment, we encourage you to take your nausea medication as directed/prescribed   If you develop nausea and vomiting that is not controlled by your nausea medication, call the clinic.   BELOW ARE SYMPTOMS THAT SHOULD BE REPORTED IMMEDIATELY:  *FEVER GREATER THAN 100.5 F  *CHILLS WITH OR WITHOUT FEVER  NAUSEA AND VOMITING THAT IS NOT CONTROLLED WITH YOUR NAUSEA MEDICATION  *UNUSUAL SHORTNESS OF BREATH  *UNUSUAL BRUISING OR BLEEDING  TENDERNESS IN MOUTH AND THROAT WITH OR WITHOUT PRESENCE OF ULCERS  *URINARY PROBLEMS  *BOWEL PROBLEMS  UNUSUAL RASH Items with * indicate a potential emergency and should be followed up as soon as possible.  Feel free to call the clinic you have any questions or concerns. The clinic phone number is [phone].  Please show the CHEMO ALERT CARD at check-in to the Emergency Department and triage nurse.   

## 2014-09-20 NOTE — Progress Notes (Signed)
[facility] NOTE  Patient Care Team: [name], MD as PCP - General (Infectious Diseases) Robert Bellow, MD (General Surgery) Leighton Ruff, MD as Consulting Physician (General Surgery) Irene Limbo, MD as Consulting Physician (Plastic Surgery) Truitt Merle, MD as Consulting Physician (Hematology) Festus Aloe, MD as Consulting Physician (Urology)  DIAGNOSIS Recurrent anal cancer    Primary cancer of anal canal   02/05/2011 Initial Diagnosis Primary cancer of anal canal, T1N0M0, s/p surgical resection with positive margines. He received definitive dose RT 45 Gy over 5 weeks with 16Gy boosting and concurrent chemo with 5FU, treated at [facility] by Drs.  Geoffreyh and [employer].    11/23/2013 Relapse/Recurrence local recurrence    04/21/2014 Surgery abdominal perianal resection (APR) and VRAM flap closure of perineal wound on 04/21/14, surgical margins were positive for cancer      OTHER RELATED ISSUES: 1. (+) HIV, on therapy with good control  2. CAD, s/p stent placement in 1998  3. Urine leakage after surgery on 04/21/14, has a foley in place now   CURRENT THERAPY: 5-FU 1000mg /m2/day on D1-5, ciplatin 100mg /m2 on D2, every 28 days, started on 06/28/2014  INTERIM HISTORY: Julian Palmer returns for follow up prior to his fourth cycle of chemo. He worked for time last week and tolerated well, he has better appetite and energy level lately. Reports mild nausea without vomiting. He denies any other new symptoms. No diarrhea or constipation. No fever or chills. His skin rash has much improved, no itchiness or skin breakdown. No significant neuropathy or hearing loss.   MEDICAL HISTORY:  Past Medical History  Diagnosis Date  . S/p bare metal coronary artery stent     1998  pLAD  . Type 2 diabetes mellitus   . Hypertension   . 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 Hyperlipidemia   . History of carcinoma in situ of anal canal     2010  . ED (erectile dysfunction)   . Sigmoid diverticulosis   . PONV (postoperative nausea and vomiting)   . Wears glasses   . At risk for sleep apnea     STOP-BANG= 4     SENT TO PCP 03-23-2014  . Coronary artery disease CARDIOLOGIST--  DR Rockey Situ --  LAST LOV 04-17-2013    S/P  PCI to mid and distal LAD and BM stent x1 to pLAD  . Anxiety   . Depression   . HIV positive   . Recurrent squamous cell carcinoma of anus     removal mass 01-08-2014  . Primary cancer of anal canal 05/18/2011    SURGICAL HISTORY: Past Surgical History  Procedure Laterality Date  . Septoplasty  1985  . Sphincterotomy  2009  . Cardiovascular stress test  04/ 2010    low risk study/  very small region of mildly reduced perfersion is mildly reversible dLAD territory  . Resection anal mass  02-05-2011  . Excision anal polyp  2009  . Re-excision anal squamous cell carcinoma  2010  . Removal rectal mass  01-08-2014  . Eua w/ bx perianal skin  06-08-2009  . Port-a-cath placement  02-21-2011    REMOVED  2013  . Coronary angioplasty with stent placement  1998   [facility]    BM stent to pLAD and PCI to Mid and Distal LAD/  no other significant cad  . Inguinal hernia repair Bilateral 1988  . Colonoscopy with propofol  05-28-2008  . Mass excision N/A 03/26/2014    Procedure: ANAL EXAM UNDER ANESTHESIA,EXCISIONAL BIOPSY OF ANAL MASS;  Surgeon: Leighton Ruff, MD;  Location: [facility];  Service: General;  Laterality: N/A;  . Laparoscopic assisted abdominal perineal resection N/A 04/22/2014    Procedure: LAPAROSCOPIC  ABDOMINAL PERINEAL RESECTION, ;  Surgeon: Leighton Ruff, MD;  Location: [facility];  Service: General;  Laterality: N/A;  . Urethrotomy Bilateral 04/22/2014    Procedure: CYSTOSCOPY/URETHROTOMY;  Surgeon: Leighton Ruff, MD;  Location: [facility];  Service:  General;  Laterality: Bilateral;  . Muscle flap closure N/A 04/22/2014    Procedure: VRAM FLAP;  Surgeon: Irene Limbo, MD;  Location: [facility];  Service: Plastics;  Laterality: N/A;  . Portacath placement Left 06/24/2014    Procedure: INSERTION PORT-A-CATH LEFT SUBCLAVIAN;  Surgeon: Leighton Ruff, MD;  Location: [facility];  Service: General;  Laterality: Left;    SOCIAL HISTORY: History   Social History  . Marital Status: Single    Spouse Name: N/A  . Number of Children: 0  . Years of Education: N/A   Occupational History  .  [place] History Main Topics  . Smoking status: Never Smoker   . Smokeless tobacco: Never Used  . Alcohol Use: No     Comment: RARE  . Drug Use: No  . Sexual Activity: Not on file   Other Topics Concern  . Not on file   Social History Narrative   Full time. Single. Does not regularly exercise.     FAMILY HISTORY: Family History  Problem Relation Age of Onset  . Diabetes type II Mother   . Breast cancer Mother 40     survivor  . Hypertension Mother   . Coronary artery disease Father   Paternal GM had utrine cancer, no other history of malignancy   ALLERGIES:  is allergic to sulfa antibiotics.  MEDICATIONS:  Current Outpatient Prescriptions  Medication Sig Dispense Refill  . aspirin (ASPIR-81) 81 MG EC tablet Take 81 mg by mouth at bedtime.     Marland Kitchen atorvastatin (LIPITOR) 80 MG tablet Take 80 mg by mouth every evening.    . citalopram (CELEXA) 40 MG tablet Take 40 mg by mouth every morning.    Marland Kitchen dexamethasone (DECADRON) 4 MG tablet Take 1 tablet (4 mg total) by mouth 2 (two) times daily with a meal. 7 tablet 1  . DPH-Lido-AlHydr-MgHydr-Simeth (FIRST-MOUTHWASH BLM) SUSP Swish and swallow 5 mLs 4 (four) times daily as needed.  0  . efavirenz-emtrictabine-tenofovir (ATRIPLA) 600-200-300 MG per tablet Take 1 tablet by mouth at bedtime.     Marland Kitchen glimepiride (AMARYL) 2 MG tablet Take 4 mg by mouth 2 (two) times daily.     Marland Kitchen glucose  blood ([employer] BLOOD GLUCOSE TEST STRIPS) test strip 1 each by Other route 2 (two) times daily.     . insulin glargine (LANTUS) 100 UNIT/ML injection Inject 0.25 mLs (25 Units total) into the skin at bedtime. (Patient taking differently: Inject 25 Units into the skin at bedtime as needed (blood sugar.). For Levels above 110) 10 mL 2  . Lancet Devices ([employer]S LANCING DEVICE) MISC 1 each by Other route 2 (two) times daily. Use 1 each once daily. Use as instructed.    . lidocaine-prilocaine (EMLA) cream Apply 1 application topically as needed. Apply to portacath 1 1/2 hours to 2 hours prior to procedures as  needed. 30 g 1  . losartan (COZAAR) 100 MG tablet Take 100 mg by mouth every evening.    . metFORMIN (GLUCOPHAGE) 1000 MG tablet Take 1,000 mg by mouth 2 (two) times daily.      . Multiple Vitamins-Minerals (MULTIVITAL) tablet Take 1 tablet by mouth every evening.     . niacin (NIASPAN) 1000 MG CR tablet Take 1,000 mg by mouth at bedtime.    . nitrofurantoin (MACRODANTIN) 50 MG capsule Take 50 mg by mouth at bedtime.     . ondansetron (ZOFRAN) 8 MG tablet Take 1 tablet (8 mg total) by mouth every 8 (eight) hours as needed for nausea or vomiting. 60 tablet 3  . oxyCODONE (OXY IR/ROXICODONE) 5 MG immediate release tablet Take 1-3 tablets (5-15 mg total) by mouth every 4 (four) hours as needed for moderate pain, severe pain or breakthrough pain. 30 tablet 0  . prochlorperazine (COMPAZINE) 10 MG tablet Take 1 tablet (10 mg total) by mouth every 6 (six) hours as needed for nausea or vomiting. 30 tablet 1  . Alum & Mag Hydroxide-Simeth (MAGIC MOUTHWASH) SOLN Take 5 mLs by mouth 4 (four) times daily as needed for mouth pain (Nystatin, Lidocaine, Maalox, and Benadryl  ingredients.). (Patient not taking: Reported on 08/23/2014) 240 mL 0  . HYDROCORTISONE, TOPICAL, 2 % LOTN Apply 1 application topically 2 (two) times daily. (Patient not taking: Reported on 08/23/2014) 1 Bottle 1   No current facility-administered  medications for this visit.    REVIEW OF SYSTEMS:   Constitutional: Denies fevers, chills or abnormal night sweats Eyes: Denies blurriness of vision, double vision or watery eyes Ears, nose, mouth, throat, and face: Denies mucositis or sore throat Respiratory: Denies cough, dyspnea or wheezes Cardiovascular: Denies palpitation, chest discomfort or lower extremity swelling Gastrointestinal:  (+) mild nausea. Denies heartburn or change in bowel habits, (+) colostomy bag Skin: (+)  abnormal skin rashes Lymphatics: Denies new lymphadenopathy or easy bruising Neurological:Denies numbness, tingling or new weaknesses Behavioral/Psych: Mood is stable, no new changes  All other systems were reviewed with the patient and are negative.  PHYSICAL EXAMINATION: ECOG PERFORMANCE STATUS: 1 - Symptomatic but completely ambulatory  Filed Vitals:   09/20/14 0901  BP: 131/85  Pulse: 91  Resp: 18   Filed Weights   09/20/14 0901  Weight: 201 lb 11.2 oz (91.491 kg)    GENERAL:alert, no distress and comfortable SKIN: skin color, texture, turgor are normal, healed rashes on her arms, legs, and trunk with skin pigmentation.  EYES: normal, conjunctiva are pink and non-injected, sclera clear OROPHARYNX:no exudate, no erythema and lips, buccal mucosa, and tongue normal  NECK: supple, thyroid normal size, non-tender, without nodularity LYMPH:  no palpable lymphadenopathy in the cervical, axillary or inguinal LUNGS: clear to auscultation and percussion with normal breathing effort HEART: regular rate & rhythm and no murmurs and no lower extremity edema ABDOMEN:abdomen soft, non-tender and normal bowel sounds. The perianal reconstruction site is much healed, with minimum clear discharge.  Musculoskeletal:no cyanosis of digits and no clubbing  PSYCH: alert & oriented x 3 with fluent speech NEURO: no focal motor/sensory deficits SKIN: multiple large skin erythematous rashon his arms, thighs, and trunk, no  ulcers or discharge.   LABORATORY DATA:  I have reviewed the data as listed Lab Results  Component Value Date   WBC 6.2 09/20/2014   HGB 11.5* 09/20/2014   HCT 33.9* 09/20/2014   MCV 92.4 09/20/2014   PLT 257 09/20/2014    Recent Labs  04/23/14  4098 04/24/14 0420 04/25/14 0505  07/26/14 1217 08/11/14 1434 08/23/14 1013  NA 136* 133* 138  < > 134* 136 135*  K 4.5 3.8 3.9  < > 4.2 4.3 4.5  CL 100 98 102  --   --   --   --   CO2 22 26 25   < > 22 24 24   GLUCOSE 299* 239* 250*  < > 291* 298* 234*  BUN 13 14 12   < > 12.8 16.3 15.6  CREATININE 0.89 0.86 0.74  < > 1.1 1.2 1.1  CALCIUM 8.0* 8.0* 7.9*  < > 8.9 9.0 9.3  GFRNONAA >90 >90 >90  --   --   --   --   GFRAA >90 >90 >90  --   --   --   --   PROT  --   --   --   < > 8.1 7.4 7.8  ALBUMIN  --   --   --   < > 3.5 3.6 3.7  AST  --   --   --   < > 21 14 20   ALT  --   --   --   < > 32 21 22  ALKPHOS  --   --   --   < > 129 88 84  BILITOT  --   --   --   < > <0.20 <0.20 <0.20  < > = values in this interval not displayed. PATHOLOGY REPORT 04/22/2014 Diagnosis 1. Soft tissue, biopsy, right pelvic sidewall; r/o malignancy - FIBROADIPOSE TISSUE, NO EVIDENCE OF MALIGNANCY. 2. Colon, segmental resection for tumor, rectum,sigmoid and anus - RECURRENT INVASIVE POORLY DIFFERENTIATED SQUAMOUS CELL CARCINOMA, INVADING THROUGH THE MUSCULARIS PROPRIA, INTO PERICOLONIC FATTY TISSUE, EXTENDING TO THE DEEP INKED MARGIN. - PERINEURAL INVASION AND ANGIOLYMPHATIC PRESENT. - SEVEN OF SEVENTEEN LYMPH NODES, POSITIVE FOR METASTATIC CARCINOMA (7/17) Microscopic Comment 2. Anus Specimen: Anus, rectum, sigmoid Procedure: Segmental resection Tumor site: Distal ends of specimen Specimen integrity: Intact Invasive tumor: Maximum size: At least 2.5 cm Histologic type(s): Invasive squamous cell carcinoma Histologic grade and differentiation: G2-3, moderately to poorly differentiated Microscopic extension of invasive tumor: Invading through the  muscularis propria into pericolonic fatty tissue involving adjacent skeletal muscle tissue Lymph-Vascular invasion: Present Peri-neural invasion: Present Resection margins: Deep margin is positive for tumor Treatment effect (neo-adjuvant therapy): Present, minimally Lymph nodes: number examined 17; number positive: 7 Pathologic Staging: ypT2, yN1 (R1) Ancillary studies: N/A  RADIOGRAPHIC STUDIES: I have personally reviewed the radiological images as listed and agreed with the findings in the report. Dg Cystogram Voiding 05/17/2014    IMPRESSION:  1. Mild degradation secondary to patient's inability to void fully.  2. Given this factor, normal appearance of the urethra.     PET[facility] 01/19/2014 at [facility]  Negative for hypermetabolic lesion or metastatic disease   ASSESSMENT & PLAN:  54 years old male with past medical history of anal squamous cell carcinoma, status post surgical resection and concurrent chemoradiation in 2009, now has local recurrence status post APR in the reconstruction.ypT2, yN1 (R1), unfortunately his deep surgical margin was positive, and 7 out of 17 lymph nodes were positive also. No distant metastasis on the preop PET scan. His medical history is also complicated by diabetes, coronary artery disease status post stent placement, and HIV on treatment. Performance status 1.  1. Local recurrence of anal squamous cell carcinoma, with positive surgical margines -Per rad/onc, he is not a candidate for re-irradiation  -I discussed that  the role of chemotherapy is to control the residual disease after surgery. Chemotherapy alone is unlikely going to be curative although. -I offered him systemic chemotherapy with cisplatin and 5-FU regimen for total of 4-6 cycles. He has good PS and organ functions, his HIV is under good control, his recent CD4 is normal at 710. His recent cardiac stress test was negative, and his cardiologist has cleared him for chemotherapy.   -he has had moderate side effects from the first 3 cycles of chemo: diarrhea, skin rash, mucositis and anorexia and tinnitus. Those are mostly resolved by now. He developed neutropenia with cycle 3 of his chemotherapy. Neulasta has been added. ANC is adequate to proceed with chemotherapy today. I encouraged him to continue chemotherapy.  -Due to mild neutropenia with cycle 3 of his chemotherapy, there was a 30% 5-FU dose reduction this cycle (15% dose reduction with cycle 2.). He also receives a Neulasta injection on day 6 when he disconnect the 5-FU pump.  2. Diabetes -His blood sugar is not very well controlled, he has missed some insulin injections. He agrees to monitor his sugar closely at home and use insulin.   3. HIV  -Well controlled. Continue his treatment . His recent CD4 count was normal.   4. Hypertension and coronary artery disease  -He will continue follow-up with his primary care physician and cardiologist. Continue medications. I'll monitor his blood pressure, blood sugar during his chemotherapy. I discussed that we sometimes use steroids during chemotherapy treatment, which can cause hyperglycemia.   5. Urethra leakage -He now has his Foley removed, and is able to urinate without difficulty. -monitoring   6. depression -He denies suicidal. He is still quite depressed and frustrated about the side effects from chemotherapy, and colostomy bag. -His mood is quite good today. He is looking forward to making a trip to [place] in July for a country music concert.  Plan -Cycle 4 chemotherapy today, with 30% 5-FU dose reduction, continued cisplatin the same dose. -Neulasta on day 6 with pump disconnection -He will check his CBC with differential on April 11 in April 21 at the local lab. He will call me for the results after blood drawn. -He will have return visit in approximate 4 weeks.  Of note, his family members do not know his HIV status. I ensured him this will be kept  confidential.   All questions were answered. The patient knows to call the clinic with any problems, questions or concerns.   I spent 25 minutes counseling the patient face to face. The total time spent in the appointment was 30 minutes and more than 50% was on counseling.     Julian Bussing, NP 09/20/2014 9:02 AM

## 2014-09-21 ENCOUNTER — Ambulatory Visit: Payer: Managed Care, Other (non HMO) | Admitting: Nutrition

## 2014-09-21 ENCOUNTER — Ambulatory Visit (HOSPITAL_BASED_OUTPATIENT_CLINIC_OR_DEPARTMENT_OTHER): Payer: Managed Care, Other (non HMO)

## 2014-09-21 DIAGNOSIS — C210 Malignant neoplasm of anus, unspecified: Secondary | ICD-10-CM

## 2014-09-21 DIAGNOSIS — C211 Malignant neoplasm of anal canal: Secondary | ICD-10-CM | POA: Diagnosis not present

## 2014-09-21 DIAGNOSIS — Z5111 Encounter for antineoplastic chemotherapy: Secondary | ICD-10-CM

## 2014-09-21 MED ORDER — PROCHLORPERAZINE MALEATE 10 MG PO TABS
ORAL_TABLET | ORAL | Status: AC
  Filled 2014-09-21: qty 1

## 2014-09-21 MED ORDER — SODIUM CHLORIDE 0.9 % IV SOLN
Freq: Once | INTRAVENOUS | Status: AC
  Administered 2014-09-21: 09:00:00 via INTRAVENOUS

## 2014-09-21 MED ORDER — PROCHLORPERAZINE MALEATE 10 MG PO TABS
10.0000 mg | ORAL_TABLET | Freq: Once | ORAL | Status: AC
  Administered 2014-09-21: 10 mg via ORAL

## 2014-09-21 MED ORDER — SODIUM CHLORIDE 0.9 % IV SOLN
95.0000 mg/m2 | Freq: Once | INTRAVENOUS | Status: AC
  Administered 2014-09-21: 200 mg via INTRAVENOUS
  Filled 2014-09-21: qty 200

## 2014-09-21 MED ORDER — POTASSIUM CHLORIDE 2 MEQ/ML IV SOLN
Freq: Once | INTRAVENOUS | Status: AC
  Administered 2014-09-21: 09:00:00 via INTRAVENOUS
  Filled 2014-09-21: qty 10

## 2014-09-21 MED ORDER — SODIUM CHLORIDE 0.9 % IV SOLN
Freq: Once | INTRAVENOUS | Status: AC
  Administered 2014-09-21: 10:00:00 via INTRAVENOUS
  Filled 2014-09-21: qty 5

## 2014-09-21 MED ORDER — PALONOSETRON HCL INJECTION 0.25 MG/5ML
INTRAVENOUS | Status: AC
  Filled 2014-09-21: qty 5

## 2014-09-21 MED ORDER — PALONOSETRON HCL INJECTION 0.25 MG/5ML
0.2500 mg | Freq: Once | INTRAVENOUS | Status: AC
  Administered 2014-09-21: 0.25 mg via INTRAVENOUS

## 2014-09-21 NOTE — Patient Instructions (Signed)
[facility] Discharge Instructions for Patients Receiving Chemotherapy  Today you received the following chemotherapy agents: Cisplatin, 5FU pump infusion  To help prevent nausea and vomiting after your treatment, we encourage you to take your nausea medication as prescribed by your physician.   If you develop nausea and vomiting that is not controlled by your nausea medication, call the clinic.   BELOW ARE SYMPTOMS THAT SHOULD BE REPORTED IMMEDIATELY:  *FEVER GREATER THAN 100.5 F  *CHILLS WITH OR WITHOUT FEVER  NAUSEA AND VOMITING THAT IS NOT CONTROLLED WITH YOUR NAUSEA MEDICATION  *UNUSUAL SHORTNESS OF BREATH  *UNUSUAL BRUISING OR BLEEDING  TENDERNESS IN MOUTH AND THROAT WITH OR WITHOUT PRESENCE OF ULCERS  *URINARY PROBLEMS  *BOWEL PROBLEMS  UNUSUAL RASH Items with * indicate a potential emergency and should be followed up as soon as possible.  Feel free to call the clinic you have any questions or concerns. The clinic phone number is [phone].  Please show the [facility] at check-in to the Emergency Department and triage nurse.

## 2014-09-21 NOTE — Progress Notes (Signed)
1144- pt reports slight nausea, requesting compazine po that he takes at home; per Selena Lesser, NP: may give compazine 10 mg po x 1 dose while here today.

## 2014-09-21 NOTE — Progress Notes (Signed)
Nutrition follow-up completed with patient receiving treatment for recurrent anal cancer. Patient is tearful today and reports he typically cries when he comes in for treatment. Weight has improved and was documented as 201.7 pounds March 28 increased from 196.8 pounds February 29. Colostomy output is satisfactory. Patient reports elevated glucose levels.  He is giving Lantus injections in the evenings.  Patient reports he eats whatever he likes. Patient reports this is his final treatment.  Nutrition diagnosis: Unintended weight loss resolved.  Provided support and encouragement for patient to continue adequate calories and protein for weight maintenance. Encouraged improved compliance on carbohydrate controlled diet, especially around chemotherapy infusions. Encouraged continued increased hydration. Offered referral to chaplain or social worker.  However, patient declined.  Patient will continue to tolerate adequate calories and protein to promote weight maintenance.  Patient has my contact information for questions or concerns.  **Disclaimer: This note was dictated with voice recognition software. Similar sounding words can inadvertently be transcribed and this note may contain transcription errors which may not have been corrected upon publication of note.**

## 2014-09-25 ENCOUNTER — Ambulatory Visit: Payer: Managed Care, Other (non HMO)

## 2014-09-25 ENCOUNTER — Ambulatory Visit (HOSPITAL_BASED_OUTPATIENT_CLINIC_OR_DEPARTMENT_OTHER): Payer: Managed Care, Other (non HMO)

## 2014-09-25 DIAGNOSIS — Z5189 Encounter for other specified aftercare: Secondary | ICD-10-CM

## 2014-09-25 DIAGNOSIS — C211 Malignant neoplasm of anal canal: Secondary | ICD-10-CM

## 2014-09-25 MED ORDER — PEGFILGRASTIM INJECTION 6 MG/0.6ML ~~LOC~~
6.0000 mg | PREFILLED_SYRINGE | Freq: Once | SUBCUTANEOUS | Status: AC
  Administered 2014-09-25: 6 mg via SUBCUTANEOUS

## 2014-09-25 MED ORDER — HEPARIN SOD (PORK) LOCK FLUSH 100 UNIT/ML IV SOLN
500.0000 [IU] | Freq: Once | INTRAVENOUS | Status: AC | PRN
  Administered 2014-09-25: 500 [IU]
  Filled 2014-09-25: qty 5

## 2014-09-25 MED ORDER — SODIUM CHLORIDE 0.9 % IJ SOLN
10.0000 mL | INTRAMUSCULAR | Status: DC | PRN
  Administered 2014-09-25: 10 mL
  Filled 2014-09-25: qty 10

## 2014-09-25 NOTE — Progress Notes (Signed)
Duplicate encounter

## 2014-09-25 NOTE — Progress Notes (Signed)
Pt vomited after his PAC deaccessed.  He denied need for IV anti emetics or IVFs today. He is taking zofran at home as prescribed.  Instructed pt to call if nausea remains unrelieved by nausea medication.  He verbalized understanding.  Pt was tearful, emotional and sat in clinic for about 30 minutes.  RNs provided emotional support and active listening.  Encouraged pt to call [facility] for any questions, concerns or unrelieved symptoms. He verbalized understanding.

## 2014-09-28 ENCOUNTER — Other Ambulatory Visit: Payer: Self-pay | Admitting: Oncology

## 2014-09-28 DIAGNOSIS — C210 Malignant neoplasm of anus, unspecified: Secondary | ICD-10-CM

## 2014-09-29 ENCOUNTER — Other Ambulatory Visit: Payer: Self-pay | Admitting: Cardiovascular Disease

## 2014-09-29 ENCOUNTER — Telehealth: Payer: Self-pay | Admitting: Hematology

## 2014-09-29 NOTE — Telephone Encounter (Signed)
Lft msg for pt confirming labs/ov per 04/06 POF, advised pt to stop by to p/u contrast for his CT scan and msg pt through my chart along with mailing out schedule to pt.... KJ

## 2014-09-29 NOTE — Telephone Encounter (Signed)
S/w Peggy in Radiology moved labs closer to CT and added flush to have labs done through port per Pt's request, mailed out schedule.... KJ pt is aware of D/T

## 2014-09-29 NOTE — Telephone Encounter (Signed)
s.w. pt and advised on r/s lab b4 ct....pt ok and aware

## 2014-10-07 ENCOUNTER — Telehealth: Payer: Self-pay | Admitting: *Deleted

## 2014-10-07 NOTE — Telephone Encounter (Signed)
TC from patient with a question on timing of eating prior to his abdominal/pelvis CT scan on Monday.  Informed patient he could have solid food up to 4 hours prior to Scan. Pt. voiced understanding.  He also states that he understands drinking contrast etc in preparation for exam.

## 2014-10-11 ENCOUNTER — Other Ambulatory Visit: Payer: Managed Care, Other (non HMO)

## 2014-10-11 ENCOUNTER — Encounter (HOSPITAL_COMMUNITY): Payer: Self-pay

## 2014-10-11 ENCOUNTER — Other Ambulatory Visit (HOSPITAL_BASED_OUTPATIENT_CLINIC_OR_DEPARTMENT_OTHER): Payer: Managed Care, Other (non HMO)

## 2014-10-11 ENCOUNTER — Ambulatory Visit (HOSPITAL_COMMUNITY)
Admission: RE | Admit: 2014-10-11 | Discharge: 2014-10-11 | Disposition: A | Discharge status: Home / Self Care | Payer: Managed Care, Other (non HMO) | Source: Ambulatory Visit | Attending: Oncology | Admitting: Oncology

## 2014-10-11 ENCOUNTER — Ambulatory Visit (HOSPITAL_BASED_OUTPATIENT_CLINIC_OR_DEPARTMENT_OTHER): Payer: Managed Care, Other (non HMO)

## 2014-10-11 DIAGNOSIS — C211 Malignant neoplasm of anal canal: Secondary | ICD-10-CM | POA: Diagnosis not present

## 2014-10-11 DIAGNOSIS — Z9221 Personal history of antineoplastic chemotherapy: Secondary | ICD-10-CM | POA: Diagnosis not present

## 2014-10-11 DIAGNOSIS — Z95828 Presence of other vascular implants and grafts: Secondary | ICD-10-CM

## 2014-10-11 DIAGNOSIS — C210 Malignant neoplasm of anus, unspecified: Secondary | ICD-10-CM

## 2014-10-11 LAB — COMPREHENSIVE METABOLIC PANEL (CC13)
ALK PHOS: 125 U/L (ref 40–150)
ALT: 19 U/L (ref 0–55)
AST: 16 U/L (ref 5–34)
Albumin: 3.9 g/dL (ref 3.5–5.0)
Anion Gap: 13 mEq/L — ABNORMAL HIGH (ref 3–11)
BUN: 24.5 mg/dL (ref 7.0–26.0)
CHLORIDE: 104 meq/L (ref 98–109)
CO2: 19 mEq/L — ABNORMAL LOW (ref 22–29)
CREATININE: 1.1 mg/dL (ref 0.7–1.3)
Calcium: 9.3 mg/dL (ref 8.4–10.4)
EGFR: 77 mL/min/{1.73_m2} — ABNORMAL LOW (ref >90)
Glucose: 149 mg/dl — ABNORMAL HIGH (ref 70–140)
POTASSIUM: 4.2 meq/L (ref 3.5–5.1)
Sodium: 137 mEq/L (ref 136–145)
TOTAL PROTEIN: 7.7 g/dL (ref 6.4–8.3)

## 2014-10-11 LAB — CBC WITH DIFFERENTIAL/PLATELET
BASO%: 0.5 % (ref 0.0–2.0)
BASOS ABS: 0 10*3/uL (ref 0.0–0.1)
EOS%: 0.5 % (ref 0.0–7.0)
Eosinophils Absolute: 0 10*3/uL (ref 0.0–0.5)
HEMATOCRIT: 30.4 % — AB (ref 38.4–49.9)
HEMOGLOBIN: 10.2 g/dL — AB (ref 13.0–17.1)
LYMPH%: 27.2 % (ref 14.0–49.0)
MCH: 33.1 pg (ref 27.2–33.4)
MCHC: 33.5 g/dL (ref 32.0–36.0)
MCV: 99 fL — AB (ref 79.3–98.0)
MONO#: 0.8 10*3/uL (ref 0.1–0.9)
MONO%: 9.3 % (ref 0.0–14.0)
NEUT%: 62.5 % (ref 39.0–75.0)
NEUTROS ABS: 5.4 10*3/uL (ref 1.5–6.5)
PLATELETS: 252 10*3/uL (ref 140–400)
RBC: 3.07 10*6/uL — ABNORMAL LOW (ref 4.20–5.82)
RDW: 21.4 % — ABNORMAL HIGH (ref 11.0–14.6)
WBC: 8.7 10*3/uL (ref 4.0–10.3)
lymph#: 2.4 10*3/uL (ref 0.9–3.3)

## 2014-10-11 MED ORDER — IOHEXOL 300 MG/ML  SOLN
100.0000 mL | Freq: Once | INTRAMUSCULAR | Status: AC | PRN
  Administered 2014-10-11: 100 mL via INTRAVENOUS

## 2014-10-11 MED ORDER — IOHEXOL 300 MG/ML  SOLN: 50.0000 mL | Freq: Once | INTRAMUSCULAR | Status: AC | PRN

## 2014-10-11 MED ORDER — HEPARIN SOD (PORK) LOCK FLUSH 100 UNIT/ML IV SOLN
500.0000 [IU] | Freq: Once | INTRAVENOUS | Status: AC
  Administered 2014-10-11: 500 [IU] via INTRAVENOUS
  Filled 2014-10-11: qty 5

## 2014-10-11 MED ORDER — SODIUM CHLORIDE 0.9 % IJ SOLN
10.0000 mL | INTRAMUSCULAR | Status: DC | PRN
  Administered 2014-10-11: 10 mL via INTRAVENOUS
  Filled 2014-10-11: qty 10

## 2014-10-11 NOTE — Patient Instructions (Signed)

## 2014-10-16 NOTE — Op Note (Signed)
PATIENT NAME:  Julian Palmer, OSTLUND MR#:  [id number] DATE OF BIRTH:  07-19-1960  DATE OF PROCEDURE:  01/08/2014  PREOPERATIVE DIAGNOSIS: Rectal mass.   POSTOPERATIVE DIAGNOSIS: Rectal mass.  OPERATIVE PROCEDURE: Excision of rectal mass.   SURGEON: Hervey Ard, MD   ANESTHESIA: Monitored anesthesia care, Exparel 20 mL, 1% Xylocaine with 1:100,000 units epinephrine, 20 mL.   ESTIMATED BLOOD LOSS: Minimal.   CLINICAL NOTE: This 54 year old male noted a rectal mass. Office biopsy was benign. He is brought to the operating room for formal excision.   OPERATIVE NOTE: With the patient in the dorsal lithotomy position, the perineum was prepped with Betadine solution and draped. The above-mentioned local anesthetics were mixed for postoperative analgesia and hemostasis. The mass was palpable at the 8 to 9 o'clock position. A bivalve speculum was placed. The mass was fairly hard and it appeared to extend from the mucosa into the submucosa. With cautery dissection this was excised in pieces. The rectal muscle was not transected. The defect was closed with interrupted 3-0 chromic sutures. A Vaseline gauze pack was placed for final hemostasis. The patient tolerated the procedure well and was taken to the recovery room in stable condition.  ____________________________ Robert Bellow, MD jwb:sb D: 01/08/2014 08:39:02 ET T: 01/08/2014 09:04:57 ET JOB#: [id number]  cc: Robert Bellow, MD, <Dictator> Cheral Marker. Ola Spurr, MD JEFFREY Amedeo Kinsman MD ELECTRONICALLY SIGNED 01/08/2014 12:31

## 2014-10-18 ENCOUNTER — Telehealth: Payer: Self-pay | Admitting: *Deleted

## 2014-10-18 ENCOUNTER — Encounter: Payer: Self-pay | Admitting: Hematology

## 2014-10-18 ENCOUNTER — Ambulatory Visit (HOSPITAL_BASED_OUTPATIENT_CLINIC_OR_DEPARTMENT_OTHER): Payer: Managed Care, Other (non HMO) | Admitting: Hematology

## 2014-10-18 VITALS — BP 146/81 | HR 99 | Temp 98.4°F | Resp 18 | Ht 71.0 in | Wt 205.5 lb

## 2014-10-18 DIAGNOSIS — E119 Type 2 diabetes mellitus without complications: Secondary | ICD-10-CM

## 2014-10-18 DIAGNOSIS — R110 Nausea: Secondary | ICD-10-CM | POA: Diagnosis not present

## 2014-10-18 DIAGNOSIS — R530 Neoplastic (malignant) related fatigue: Secondary | ICD-10-CM

## 2014-10-18 DIAGNOSIS — I2510 Atherosclerotic heart disease of native coronary artery without angina pectoris: Secondary | ICD-10-CM

## 2014-10-18 DIAGNOSIS — B20 Human immunodeficiency virus [HIV] disease: Secondary | ICD-10-CM

## 2014-10-18 DIAGNOSIS — C210 Malignant neoplasm of anus, unspecified: Secondary | ICD-10-CM | POA: Diagnosis not present

## 2014-10-18 DIAGNOSIS — F329 Major depressive disorder, single episode, unspecified: Secondary | ICD-10-CM

## 2014-10-18 NOTE — Telephone Encounter (Signed)
PT. CALLED REQUESTING AN OPEN MRI. THIS NOTE ROUTED TO DR.FENG AND THU BRAY,RN.

## 2014-10-18 NOTE — Telephone Encounter (Signed)
Julian Palmer, could you contact radiology to see if we can schedule him for a open MRI?  Truitt Merle MD 10/18/2014

## 2014-10-18 NOTE — Progress Notes (Signed)
[place] NOTE  Patient Care Team: [name], MD as PCP - General (Infectious Diseases) Robert Bellow, MD (General Surgery) Leighton Ruff, MD as Consulting Physician (General Surgery) Irene Limbo, MD as Consulting Physician (Plastic Surgery) Truitt Merle, MD as Consulting Physician (Hematology) Festus Aloe, MD as Consulting Physician (Urology)  DIAGNOSIS Recurrent anal cancer    Primary cancer of anal canal   02/05/2011 Initial Diagnosis Primary cancer of anal canal, T1N0M0, s/p surgical resection with positive margines. He received definitive dose RT 45 Gy over 5 weeks with 16Gy boosting and concurrent chemo with 5FU, treated at [facility] by Drs.  Geoffreyh and [employer].    11/23/2013 Relapse/Recurrence local recurrence    04/21/2014 Surgery abdominal perianal resection (APR) and VRAM flap closure of perineal wound on 04/21/14, surgical margins were positive for cancer    06/28/2014 - 09/25/2014 Chemotherapy 5-FU 1000mg /m2/day on D1-5, ciplatin 100mg /m2 on D2, every 28 days, s/p 4 cycles    10/11/2014 Progression CT CAP showed new liver lesions and abd/pelc adenopathy, highly suspecious for progressive metastatic disease      OTHER RELATED ISSUES: 1. (+) HIV, on therapy with good control  2. CAD, s/p stent placement in 1998  3. Urine leakage after surgery on 04/21/14, has a foley in place now   CURRENT THERAPY: Observation   INTERIM HISTORY: Julian Palmer returns for follow up. He completed his last cycle chemotherapy 3 weeks ago, he had more fatigue, nausea and diarrhea, it took longer to recover. He feels well lately, and has recovered very well. He has good energy level and decent appetite. No significant skin rash with the last cycle chemotherapy. No significant pain.    MEDICAL HISTORY:  Past Medical History  Diagnosis Date  . S/p bare metal coronary artery stent     1998  pLAD  . Hypertension   . 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 Hyperlipidemia   . History of carcinoma in situ of anal canal     2010  . ED (erectile dysfunction)   . Sigmoid diverticulosis   . PONV (postoperative nausea and vomiting)   . Wears glasses   . At risk for sleep apnea     STOP-BANG= 4     SENT TO PCP 03-23-2014  . Coronary artery disease CARDIOLOGIST--  DR Rockey Situ --  LAST LOV 04-17-2013    S/P  PCI to mid and distal LAD and BM stent x1 to pLAD  . Anxiety   . Depression   . HIV positive   . Recurrent squamous cell carcinoma of anus     removal mass 01-08-2014  . 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 Type 2 diabetes mellitus     SURGICAL HISTORY: Past Surgical History  Procedure Laterality Date  . Septoplasty  1985  . Sphincterotomy  2009  . Cardiovascular stress test  04/ 2010    low risk study/  very small region of mildly reduced perfersion is mildly reversible dLAD territory  . Resection anal mass  02-05-2011  . Excision anal polyp  2009  . Re-excision anal squamous cell carcinoma  2010  . Removal rectal mass  01-08-2014  . Eua w/ bx perianal skin  06-08-2009  . Port-a-cath placement  02-21-2011    REMOVED  2013  . Coronary angioplasty with stent  placement  1998   [facility]    BM stent to pLAD and PCI to Mid and Distal LAD/  no other significant cad  . Inguinal hernia repair Bilateral 1988  . Colonoscopy with propofol  05-28-2008  . Mass excision N/A 03/26/2014    Procedure: ANAL EXAM UNDER ANESTHESIA,EXCISIONAL BIOPSY OF ANAL MASS;  Surgeon: Leighton Ruff, MD;  Location: [facility];  Service: General;  Laterality: N/A;  . Laparoscopic assisted abdominal perineal resection N/A 04/22/2014    Procedure: LAPAROSCOPIC  ABDOMINAL PERINEAL RESECTION, ;  Surgeon: Leighton Ruff, MD;  Location: [facility];  Service: General;  Laterality: N/A;  . Urethrotomy  Bilateral 04/22/2014    Procedure: CYSTOSCOPY/URETHROTOMY;  Surgeon: Leighton Ruff, MD;  Location: [facility];  Service: General;  Laterality: Bilateral;  . Muscle flap closure N/A 04/22/2014    Procedure: VRAM FLAP;  Surgeon: Irene Limbo, MD;  Location: [facility];  Service: Plastics;  Laterality: N/A;  . Portacath placement Left 06/24/2014    Procedure: INSERTION PORT-A-CATH LEFT SUBCLAVIAN;  Surgeon: Leighton Ruff, MD;  Location: [facility];  Service: General;  Laterality: Left;    SOCIAL HISTORY: History   Social History  . Marital Status: Single    Spouse Name: N/A  . Number of Children: 0  . Years of Education: N/A   Occupational History  .  [employer] History Main Topics  . Smoking status: Never Smoker   . Smokeless tobacco: Never Used  . Alcohol Use: No     Comment: RARE  . Drug Use: No  . Sexual Activity: Not on file   Other Topics Concern  . Not on file   Social History Narrative   Full time. Single. Does not regularly exercise.     FAMILY HISTORY: Family History  Problem Relation Age of Onset  . Diabetes type II Mother   . Breast cancer Mother 55     survivor  . Hypertension Mother   . Coronary artery disease Father   Paternal GM had utrine cancer, no other history of malignancy   ALLERGIES:  is allergic to sulfa antibiotics.  MEDICATIONS:  Current Outpatient Prescriptions  Medication Sig Dispense Refill  . aspirin (ASPIR-81) 81 MG EC tablet Take 81 mg by mouth at bedtime.     Marland Kitchen atorvastatin (LIPITOR) 80 MG tablet Take 80 mg by mouth every evening.    . citalopram (CELEXA) 40 MG tablet Take 40 mg by mouth every morning.    . DPH-Lido-AlHydr-MgHydr-Simeth (FIRST-MOUTHWASH BLM) SUSP Swish and swallow 5 mLs 4 (four) times daily as needed.  0  . efavirenz-emtrictabine-tenofovir (ATRIPLA) 600-200-300 MG per tablet Take 1 tablet by mouth at bedtime.     Marland Kitchen glimepiride (AMARYL) 2 MG tablet Take 4 mg by mouth 2 (two) times daily.     Marland Kitchen glucose  blood ([employer]S BLOOD GLUCOSE TEST STRIPS) test strip 1 each by Other route 2 (two) times daily.     . insulin glargine (LANTUS) 100 UNIT/ML injection Inject 0.25 mLs (25 Units total) into the skin at bedtime. (Patient taking differently: Inject 25 Units into the skin at bedtime as needed (blood sugar.). For Levels above 110) 10 mL 2  . Lancet Devices (CVS LANCING DEVICE) MISC 1 each by Other route 2 (two) times daily. Use 1 each once daily. Use as instructed.    . lidocaine-prilocaine (EMLA) cream Apply 1 application topically as needed. Apply to portacath 1 1/2 hours to 2 hours prior to procedures as needed. 30 g 1  .  losartan (COZAAR) 100 MG tablet TAKE 1 TABLET EVERY DAY 90 tablet 3  . metFORMIN (GLUCOPHAGE) 1000 MG tablet Take 1,000 mg by mouth 2 (two) times daily.      . Multiple Vitamins-Minerals (MULTIVITAL) tablet Take 1 tablet by mouth every evening.     . niacin (NIASPAN) 1000 MG CR tablet Take 1,000 mg by mouth at bedtime.    Marland Kitchen oxyCODONE (OXY IR/ROXICODONE) 5 MG immediate release tablet Take 1-3 tablets (5-15 mg total) by mouth every 4 (four) hours as needed for moderate pain, severe pain or breakthrough pain. 30 tablet 0  . ondansetron (ZOFRAN) 8 MG tablet Take 1 tablet (8 mg total) by mouth every 8 (eight) hours as needed for nausea or vomiting. (Patient not taking: Reported on 10/18/2014) 60 tablet 3  . prochlorperazine (COMPAZINE) 10 MG tablet Take 1 tablet (10 mg total) by mouth every 6 (six) hours as needed for nausea or vomiting. (Patient not taking: Reported on 10/18/2014) 30 tablet 1   No current facility-administered medications for this visit.    REVIEW OF SYSTEMS:   Constitutional: Denies fevers, chills or abnormal night sweats Eyes: Denies blurriness of vision, double vision or watery eyes Ears, nose, mouth, throat, and face: Denies mucositis or sore throat Respiratory: Denies cough, dyspnea or wheezes Cardiovascular: Denies palpitation, chest discomfort or lower extremity  swelling Gastrointestinal:  Denies nausea, heartburn or change in bowel habits, (+) colostomy bag Skin: (+)  abnormal skin rashes Lymphatics: Denies new lymphadenopathy or easy bruising Neurological:Denies numbness, tingling or new weaknesses Behavioral/Psych: Mood is stable, no new changes  All other systems were reviewed with the patient and are negative.  PHYSICAL EXAMINATION: ECOG PERFORMANCE STATUS: 1 - Symptomatic but completely ambulatory  Filed Vitals:   10/18/14 0924  BP: 146/81  Pulse: 99  Temp: 98.4 F (36.9 C)  Resp: 18   Filed Weights   10/18/14 0924  Weight: 205 lb 8 oz (93.214 kg)    GENERAL:alert, no distress and comfortable SKIN: skin color, texture, turgor are normal, healed rashes on her arms, legs, and trunk with skin pigmentation.  EYES: normal, conjunctiva are pink and non-injected, sclera clear OROPHARYNX:no exudate, no erythema and lips, buccal mucosa, and tongue normal  NECK: supple, thyroid normal size, non-tender, without nodularity LYMPH:  no palpable lymphadenopathy in the cervical, axillary or inguinal LUNGS: clear to auscultation and percussion with normal breathing effort HEART: regular rate & rhythm and no murmurs and no lower extremity edema ABDOMEN:abdomen soft, non-tender and normal bowel sounds. The perianal reconstruction site is much healed, with minimum clear discharge.  Musculoskeletal:no cyanosis of digits and no clubbing  PSYCH: alert & oriented x 3 with fluent speech NEURO: no focal motor/sensory deficits SKIN: multiple large skin erythematous rashon his arms, thighs, and trunk, no ulcers or discharge.   LABORATORY DATA:  I have reviewed the data as listed Lab Results  Component Value Date   WBC 8.7 10/11/2014   HGB 10.2* 10/11/2014   HCT 30.4* 10/11/2014   MCV 99.0* 10/11/2014   PLT 252 10/11/2014    Recent Labs  04/23/14 0545 04/24/14 0420 04/25/14 0505  08/23/14 1013 09/20/14 0828 10/11/14 1154  NA 136* 133* 138   < > 135* 136 137  K 4.5 3.8 3.9  < > 4.5 4.4 4.2  CL 100 98 102  --   --   --   --   CO2 22 26 25   < > 24 21* 19*  GLUCOSE 299* 239* 250*  < > 234*  325* 149*  BUN 13 14 12   < > 15.6 24.2 24.5  CREATININE 0.89 0.86 0.74  < > 1.1 1.3 1.1  CALCIUM 8.0* 8.0* 7.9*  < > 9.3 9.4 9.3  GFRNONAA >90 >90 >90  --   --   --   --   GFRAA >90 >90 >90  --   --   --   --   PROT  --   --   --   < > 7.8 8.4* 7.7  ALBUMIN  --   --   --   < > 3.7 4.1 3.9  AST  --   --   --   < > 20 16 16   ALT  --   --   --   < > 22 23 19   ALKPHOS  --   --   --   < > 84 104 125  BILITOT  --   --   --   < > <0.20 0.25 <0.20  < > = values in this interval not displayed. PATHOLOGY REPORT 04/22/2014 Diagnosis 1. Soft tissue, biopsy, right pelvic sidewall; r/o malignancy - FIBROADIPOSE TISSUE, NO EVIDENCE OF MALIGNANCY. 2. Colon, segmental resection for tumor, rectum,sigmoid and anus - RECURRENT INVASIVE POORLY DIFFERENTIATED SQUAMOUS CELL CARCINOMA, INVADING THROUGH THE MUSCULARIS PROPRIA, INTO PERICOLONIC FATTY TISSUE, EXTENDING TO THE DEEP INKED MARGIN. - PERINEURAL INVASION AND ANGIOLYMPHATIC PRESENT. - SEVEN OF SEVENTEEN LYMPH NODES, POSITIVE FOR METASTATIC CARCINOMA (7/17) Microscopic Comment 2. Anus Specimen: Anus, rectum, sigmoid Procedure: Segmental resection Tumor site: Distal ends of specimen Specimen integrity: Intact Invasive tumor: Maximum size: At least 2.5 cm Histologic type(s): Invasive squamous cell carcinoma Histologic grade and differentiation: G2-3, moderately to poorly differentiated Microscopic extension of invasive tumor: Invading through the muscularis propria into pericolonic fatty tissue involving adjacent skeletal muscle tissue Lymph-Vascular invasion: Present Peri-neural invasion: Present Resection margins: Deep margin is positive for tumor Treatment effect (neo-adjuvant therapy): Present, minimally Lymph nodes: number examined 17; number positive: 7 Pathologic Staging: ypT2, yN1  (R1) Ancillary studies: N/A  RADIOGRAPHIC STUDIES: I have personally reviewed the radiological images as listed and agreed with the findings in the report.  CT chest/abd/pelvis with contrast 10/11/2014 IMPRESSION: Interval development of new hypodense ill-defined hepatic lesions which, in the context of increased gastrohepatic, retroperitoneal, and pelvic sidewall lymphadenopathy, are highly suspicious for progressive metastatic disease.  No evidence for intrathoracic metastatic disease or acute abnormality.   ASSESSMENT & PLAN:  54 years old male with past medical history of anal squamous cell carcinoma, status post surgical resection and concurrent chemoradiation in 2009, now has local recurrence status post APR in the reconstruction.ypT2, yN1 (R1), unfortunately his deep surgical margin was positive, and 7 out of 17 lymph nodes were positive also. No distant metastasis on the preop PET scan. His medical history is also complicated by diabetes, coronary artery disease status post stent placement, and HIV on treatment. Performance status 1.  1. Local recurrence of anal squamous cell carcinoma, with positive surgical margines -Per rad/onc, he is not a candidate for re-irradiation  -I discussed that the role of chemotherapy is to control the residual disease after surgery. Chemotherapy alone is unlikely going to be curative although. -I offered him systemic chemotherapy with cisplatin and 5-FU regimen for total of 4-6 cycles. He has good PS and organ functions, his HIV is under good control, his recent CD4 is normal at 710. His recent cardiac stress test was negative, and his cardiologist has cleared him for chemotherapy.  -he has  had moderate side effects from chemo: diarrhea, skin rash, mucositis and anorexia and tinnitus. Those are mostly resolved by now.  -I reviewed his restaging CT scan in person. A few centimeter new hypodense liver lesions are suspicious for cancer recurrence and  metastasis. The largest 2.9 cm in the left lobe is likely a hemangioma, stable.  -I recommend to have a liver MRI to further eval to his new liver lesions. He would like to have an open MRI in [place]. We'll arrange -he has a trip to [place] in early June, he is not interested in getting any more chemotherapy before that.  2. Diabetes -His blood sugar is not very well controlled, he has missed some insulin injections. He agrees to monitor his sugar closely at home and use insulin.   3. HIV  -Well controlled. Continue his treatment . His recent CD4 count was normal.   4. Hypertension and coronary artery disease  -He will continue follow-up with his primary care physician and cardiologist. Continue medications. I'll monitor his blood pressure, blood sugar during his chemotherapy. I discussed that we sometimes use steroids during chemotherapy treatment, which can cause hyperglycemia.   5. Urethra leakage -He now has his Foley removed, and is able to urinate without difficulty. -monitoring   6. depression -He denies suicidal. He is still quite depressed and frustrated about the side effects from chemotherapy, and colostomy bag. -His mood is quite good today. He is looking forward to making a trip to [place] in July for a country music concert.  Plan -abdominal liver MRI with and without contrast in [place] -Return to clinic in 3 weeks to discuss MRI results Of note, his family members do not know his HIV status. I ensured him this will be kept confidential.   All questions were answered. The patient knows to call the clinic with any problems, questions or concerns.   I spent 25 minutes counseling the patient face to face. The total time spent in the appointment was 30 minutes and more than 50% was on counseling.     Truitt Merle, MD 10/18/2014 12:17 PM

## 2014-10-18 NOTE — Telephone Encounter (Signed)
Pt called and left message wanting to inform Dr. Burr Medico re:  Pt had open MRI in [place] at  [facility] on [address].                                                                                                                                                        [place]. Pt stated he would like to have upcoming MRI scheduled at this same facility . [place]  Phone     [phone].

## 2014-10-18 NOTE — Telephone Encounter (Signed)
Patient calling to say that [facility] [place] has an open MRI. He would like to have his MRI scheduled here [phone]

## 2014-10-19 ENCOUNTER — Other Ambulatory Visit: Payer: Self-pay | Admitting: *Deleted

## 2014-10-19 ENCOUNTER — Encounter: Payer: Self-pay | Admitting: Hematology

## 2014-10-20 ENCOUNTER — Telehealth: Payer: Self-pay | Admitting: Hematology

## 2014-10-20 NOTE — Telephone Encounter (Signed)
Confirmed appointment for PET scan & MD visit 05/18. Mailed calendar.

## 2014-10-24 ENCOUNTER — Other Ambulatory Visit: Payer: Self-pay | Admitting: Cardiovascular Disease

## 2014-11-01 ENCOUNTER — Telehealth: Payer: Self-pay | Admitting: *Deleted

## 2014-11-01 NOTE — Telephone Encounter (Signed)
°  1. Which medications need to be refilled? Atorvastatin  2. Which pharmacy is medication to be sent to? [facility] on [address]  3. Do they need a 30 day or 90 day supply? Just enough to last unitl apt    4. Would they like a call back once the medication has been sent to the pharmacy? No

## 2014-11-02 ENCOUNTER — Ambulatory Visit
Admission: RE | Admit: 2014-11-02 | Discharge: 2014-11-02 | Disposition: A | Discharge status: Home / Self Care | Payer: Managed Care, Other (non HMO) | Source: Ambulatory Visit | Attending: Hematology | Admitting: Hematology

## 2014-11-02 DIAGNOSIS — K8020 Calculus of gallbladder without cholecystitis without obstruction: Secondary | ICD-10-CM | POA: Insufficient documentation

## 2014-11-02 DIAGNOSIS — C210 Malignant neoplasm of anus, unspecified: Secondary | ICD-10-CM | POA: Diagnosis present

## 2014-11-02 DIAGNOSIS — D1803 Hemangioma of intra-abdominal structures: Secondary | ICD-10-CM | POA: Insufficient documentation

## 2014-11-02 MED ORDER — GADOBENATE DIMEGLUMINE 529 MG/ML IV SOLN
19.0000 mL | Freq: Once | INTRAVENOUS | Status: AC
  Administered 2014-11-02: 19 mL via INTRAVENOUS

## 2014-11-02 MED ORDER — ATORVASTATIN CALCIUM 80 MG PO TABS: 80.0000 mg | ORAL_TABLET | Freq: Every evening | ORAL | Status: DC

## 2014-11-02 MED ORDER — GADOBENATE DIMEGLUMINE 529 MG/ML IV SOLN
20.0000 mL | Freq: Once | INTRAVENOUS | Status: AC | PRN
  Administered 2014-11-02: 20 mL via INTRAVENOUS

## 2014-11-02 NOTE — Telephone Encounter (Signed)
Refill sent for atorvastatin. 

## 2014-11-10 ENCOUNTER — Ambulatory Visit (HOSPITAL_BASED_OUTPATIENT_CLINIC_OR_DEPARTMENT_OTHER): Payer: Managed Care, Other (non HMO) | Admitting: Hematology

## 2014-11-10 ENCOUNTER — Ambulatory Visit (HOSPITAL_BASED_OUTPATIENT_CLINIC_OR_DEPARTMENT_OTHER): Payer: Managed Care, Other (non HMO)

## 2014-11-10 ENCOUNTER — Other Ambulatory Visit (HOSPITAL_BASED_OUTPATIENT_CLINIC_OR_DEPARTMENT_OTHER): Payer: Managed Care, Other (non HMO)

## 2014-11-10 ENCOUNTER — Telehealth: Payer: Self-pay | Admitting: Hematology

## 2014-11-10 ENCOUNTER — Encounter: Payer: Self-pay | Admitting: Hematology

## 2014-11-10 VITALS — BP 138/86 | HR 82 | Temp 98.2°F | Resp 18 | Ht 71.0 in | Wt 206.4 lb

## 2014-11-10 DIAGNOSIS — K769 Liver disease, unspecified: Secondary | ICD-10-CM

## 2014-11-10 DIAGNOSIS — I10 Essential (primary) hypertension: Secondary | ICD-10-CM

## 2014-11-10 DIAGNOSIS — I2510 Atherosclerotic heart disease of native coronary artery without angina pectoris: Secondary | ICD-10-CM

## 2014-11-10 DIAGNOSIS — C211 Malignant neoplasm of anal canal: Secondary | ICD-10-CM

## 2014-11-10 DIAGNOSIS — B20 Human immunodeficiency virus [HIV] disease: Secondary | ICD-10-CM | POA: Diagnosis not present

## 2014-11-10 DIAGNOSIS — R32 Unspecified urinary incontinence: Secondary | ICD-10-CM

## 2014-11-10 DIAGNOSIS — Z95828 Presence of other vascular implants and grafts: Secondary | ICD-10-CM

## 2014-11-10 DIAGNOSIS — F329 Major depressive disorder, single episode, unspecified: Secondary | ICD-10-CM

## 2014-11-10 DIAGNOSIS — C210 Malignant neoplasm of anus, unspecified: Secondary | ICD-10-CM

## 2014-11-10 DIAGNOSIS — E119 Type 2 diabetes mellitus without complications: Secondary | ICD-10-CM

## 2014-11-10 LAB — CBC WITH DIFFERENTIAL/PLATELET
BASO%: 1 % (ref 0.0–2.0)
BASOS ABS: 0 10*3/uL (ref 0.0–0.1)
EOS%: 3.9 % (ref 0.0–7.0)
Eosinophils Absolute: 0.2 10*3/uL (ref 0.0–0.5)
HCT: 33.1 % — ABNORMAL LOW (ref 38.4–49.9)
HGB: 11.4 g/dL — ABNORMAL LOW (ref 13.0–17.1)
LYMPH#: 1.8 10*3/uL (ref 0.9–3.3)
LYMPH%: 37.1 % (ref 14.0–49.0)
MCH: 34.6 pg — ABNORMAL HIGH (ref 27.2–33.4)
MCHC: 34.4 g/dL (ref 32.0–36.0)
MCV: 100.6 fL — ABNORMAL HIGH (ref 79.3–98.0)
MONO#: 0.7 10*3/uL (ref 0.1–0.9)
MONO%: 14.6 % — ABNORMAL HIGH (ref 0.0–14.0)
NEUT#: 2.1 10*3/uL (ref 1.5–6.5)
NEUT%: 43.4 % (ref 39.0–75.0)
Platelets: 229 10*3/uL (ref 140–400)
RBC: 3.29 10*6/uL — AB (ref 4.20–5.82)
RDW: 13.5 % (ref 11.0–14.6)
WBC: 4.8 10*3/uL (ref 4.0–10.3)

## 2014-11-10 LAB — COMPREHENSIVE METABOLIC PANEL (CC13)
ALT: 22 U/L (ref 0–55)
ANION GAP: 12 meq/L — AB (ref 3–11)
AST: 22 U/L (ref 5–34)
Albumin: 3.8 g/dL (ref 3.5–5.0)
Alkaline Phosphatase: 92 U/L (ref 40–150)
BILIRUBIN TOTAL: 0.24 mg/dL (ref 0.20–1.20)
BUN: 20.9 mg/dL (ref 7.0–26.0)
CO2: 23 meq/L (ref 22–29)
CREATININE: 1.1 mg/dL (ref 0.7–1.3)
Calcium: 9.5 mg/dL (ref 8.4–10.4)
Chloride: 103 mEq/L (ref 98–109)
EGFR: 78 mL/min/{1.73_m2} — ABNORMAL LOW (ref >90)
Glucose: 113 mg/dl (ref 70–140)
Potassium: 4.1 mEq/L (ref 3.5–5.1)
SODIUM: 138 meq/L (ref 136–145)
Total Protein: 7.9 g/dL (ref 6.4–8.3)

## 2014-11-10 LAB — MAGNESIUM (CC13): MAGNESIUM: 2.2 mg/dL (ref 1.5–2.5)

## 2014-11-10 MED ORDER — HEPARIN SOD (PORK) LOCK FLUSH 100 UNIT/ML IV SOLN
500.0000 [IU] | Freq: Once | INTRAVENOUS | Status: AC
  Administered 2014-11-10: 500 [IU] via INTRAVENOUS
  Filled 2014-11-10: qty 5

## 2014-11-10 MED ORDER — SODIUM CHLORIDE 0.9 % IJ SOLN
10.0000 mL | INTRAMUSCULAR | Status: DC | PRN
  Administered 2014-11-10: 10 mL via INTRAVENOUS
  Filled 2014-11-10: qty 10

## 2014-11-10 NOTE — Patient Instructions (Signed)

## 2014-11-10 NOTE — Progress Notes (Signed)
[place] NOTE  Patient Care Team: [name], MD as PCP - General (Infectious Diseases) Robert Bellow, MD (General Surgery) Leighton Ruff, MD as Consulting Physician (General Surgery) Irene Limbo, MD as Consulting Physician (Plastic Surgery) Truitt Merle, MD as Consulting Physician (Hematology) Festus Aloe, MD as Consulting Physician (Urology)  DIAGNOSIS Recurrent anal cancer    Primary cancer of anal canal   02/05/2011 Initial Diagnosis Primary cancer of anal canal, T1N0M0, s/p surgical resection with positive margines. He received definitive dose RT 45 Gy over 5 weeks with 16Gy boosting and concurrent chemo with 5FU, treated at [facility] by Drs.  Geoffreyh and [employer].    11/23/2013 Relapse/Recurrence local recurrence    04/21/2014 Surgery abdominal perianal resection (APR) and VRAM flap closure of perineal wound on 04/21/14, surgical margins were positive for cancer    06/28/2014 - 09/25/2014 Chemotherapy 5-FU 1000mg /m2/day on D1-5, ciplatin 100mg /m2 on D2, every 28 days, s/p 4 cycles    10/11/2014 Progression CT CAP showed new liver lesions and abd/pelc adenopathy, highly suspecious for progressive metastatic disease    11/02/2014 Imaging abdomen MRI w wo contrast showed new small lesions in the right hepatic lobe with nonspecific enhancement, and enlarging nodes in the porta hepatis, metastatic disease not excluded      OTHER RELATED ISSUES: 1. (+) HIV, on therapy with good control  2. CAD, s/p stent placement in [place]: Observation   INTERIM HISTORY: Julian Palmer returns for follow up and discuss his MRI findings. He feels well, denies any pain, or other new symptoms. He has good appetite and eating well, has good energy level. He is looking forward to participating a [place] in [place] in 2 weeks.   MEDICAL HISTORY:  Past Medical History  Diagnosis Date  . S/p bare metal coronary artery stent      1998  pLAD  . Hypertension   . 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 Hyperlipidemia   . History of carcinoma in situ of anal canal     2010  . ED (erectile dysfunction)   . Sigmoid diverticulosis   . PONV (postoperative nausea and vomiting)   . Wears glasses   . At risk for sleep apnea     STOP-BANG= 4     SENT TO PCP 03-23-2014  . Coronary artery disease CARDIOLOGIST--  DR Rockey Situ --  LAST LOV 04-17-2013    S/P  PCI to mid and distal LAD and BM stent x1 to pLAD  . Anxiety   . Depression   . HIV positive   . Recurrent squamous cell carcinoma of anus     removal mass 01-08-2014  . 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 Type 2 diabetes mellitus     SURGICAL HISTORY: Past Surgical History  Procedure Laterality Date  . Septoplasty  1985  . Sphincterotomy  2009  . Cardiovascular stress test  04/ 2010    low risk study/  very small region of mildly reduced perfersion is mildly reversible dLAD territory  . Resection anal mass  02-05-2011  . Excision anal polyp  2009  . Re-excision anal squamous cell carcinoma  2010  . Removal rectal mass  01-08-2014  . Eua w/ bx perianal skin  06-08-2009  . Port-a-cath placement  02-21-2011  REMOVED  2013  . Coronary angioplasty with stent placement  1998   [facility]    BM stent to pLAD and PCI to Mid and Distal LAD/  no other significant cad  . Inguinal hernia repair Bilateral 1988  . Colonoscopy with propofol  05-28-2008  . Mass excision N/A 03/26/2014    Procedure: ANAL EXAM UNDER ANESTHESIA,EXCISIONAL BIOPSY OF ANAL MASS;  Surgeon: Leighton Ruff, MD;  Location: [place];  Service: General;  Laterality: N/A;  . Laparoscopic assisted abdominal perineal resection N/A 04/22/2014    Procedure: LAPAROSCOPIC  ABDOMINAL PERINEAL RESECTION, ;  Surgeon: Leighton Ruff, MD;  Location: [facility];  Service: General;  Laterality: N/A;  . Urethrotomy Bilateral 04/22/2014    Procedure: CYSTOSCOPY/URETHROTOMY;  Surgeon: Leighton Ruff, MD;  Location: [facility];  Service: General;  Laterality: Bilateral;  . Muscle flap closure N/A 04/22/2014    Procedure: VRAM FLAP;  Surgeon: Irene Limbo, MD;  Location: [facility];  Service: Plastics;  Laterality: N/A;  . Portacath placement Left 06/24/2014    Procedure: INSERTION PORT-A-CATH LEFT SUBCLAVIAN;  Surgeon: Leighton Ruff, MD;  Location: [facility];  Service: General;  Laterality: Left;    SOCIAL HISTORY: History   Social History  . Marital Status: Single    Spouse Name: N/A  . Number of Children: 0  . Years of Education: N/A   Occupational History  .  [place] History Main Topics  . Smoking status: Never Smoker   . Smokeless tobacco: Never Used  . Alcohol Use: No     Comment: RARE  . Drug Use: No  . Sexual Activity: Not on file   Other Topics Concern  . Not on file   Social History Narrative   Full time. Single. Does not regularly exercise.     FAMILY HISTORY: Family History  Problem Relation Age of Onset  . Diabetes type II Mother   . Breast cancer Mother 56     survivor  . Hypertension Mother   . Coronary artery disease Father   Paternal GM had utrine cancer, no other history of malignancy   ALLERGIES:  is allergic to sulfa antibiotics.  MEDICATIONS:  Current Outpatient Prescriptions  Medication Sig Dispense Refill  . aspirin (ASPIR-81) 81 MG EC tablet Take 81 mg by mouth at bedtime.     Marland Kitchen atorvastatin (LIPITOR) 80 MG tablet Take 1 tablet (80 mg total) by mouth every evening. 30 tablet 3  . citalopram (CELEXA) 40 MG tablet Take 40 mg by mouth every morning.    Marland Kitchen efavirenz-emtrictabine-tenofovir (ATRIPLA) 600-200-300 MG per tablet Take 1 tablet by mouth at bedtime.     . Empagliflozin-Linagliptin 25-5 MG TABS Take 25 mg by mouth daily.    Marland Kitchen glimepiride (AMARYL) 2 MG tablet Take 4 mg by mouth 2  (two) times daily.     Marland Kitchen glucose blood (CVS BLOOD GLUCOSE TEST STRIPS) test strip 1 each by Other route 2 (two) times daily.     Elmore Guise Devices (CVS LANCING DEVICE) MISC 1 each by Other route 2 (two) times daily. Use 1 each once daily. Use as instructed.    Marland Kitchen losartan (COZAAR) 100 MG tablet TAKE 1 TABLET EVERY DAY 90 tablet 3  . metFORMIN (GLUCOPHAGE) 1000 MG tablet Take 1,000 mg by mouth 2 (two) times daily.      . Multiple Vitamins-Minerals (MULTIVITAL) tablet Take 1 tablet by mouth every evening.     . niacin (NIASPAN) 1000 MG CR tablet Take 1,000  mg by mouth at bedtime.    . DPH-Lido-AlHydr-MgHydr-Simeth (FIRST-MOUTHWASH BLM) SUSP Swish and swallow 5 mLs 4 (four) times daily as needed.  0  . lidocaine-prilocaine (EMLA) cream Apply 1 application topically as needed. Apply to portacath 1 1/2 hours to 2 hours prior to procedures as needed. (Patient not taking: Reported on 11/10/2014) 30 g 1  . ondansetron (ZOFRAN) 8 MG tablet Take 1 tablet (8 mg total) by mouth every 8 (eight) hours as needed for nausea or vomiting. (Patient not taking: Reported on 10/18/2014) 60 tablet 3  . oxyCODONE (OXY IR/ROXICODONE) 5 MG immediate release tablet Take 1-3 tablets (5-15 mg total) by mouth every 4 (four) hours as needed for moderate pain, severe pain or breakthrough pain. (Patient not taking: Reported on 11/10/2014) 30 tablet 0  . prochlorperazine (COMPAZINE) 10 MG tablet Take 1 tablet (10 mg total) by mouth every 6 (six) hours as needed for nausea or vomiting. (Patient not taking: Reported on 10/18/2014) 30 tablet 1  . traMADol (ULTRAM) 50 MG tablet Take 1 tablet by mouth every 6 (six) hours as needed.  0   No current facility-administered medications for this visit.   Facility-Administered Medications Ordered in Other Visits  Medication Dose Route Frequency Provider Last Rate Last Dose  . sodium chloride 0.9 % injection 10 mL  10 mL Intravenous PRN Truitt Merle, MD   10 mL at 11/10/14 0830    REVIEW OF SYSTEMS:    Constitutional: Denies fevers, chills or abnormal night sweats Eyes: Denies blurriness of vision, double vision or watery eyes Ears, nose, mouth, throat, and face: Denies mucositis or sore throat Respiratory: Denies cough, dyspnea or wheezes Cardiovascular: Denies palpitation, chest discomfort or lower extremity swelling Gastrointestinal:  Denies nausea, heartburn or change in bowel habits, (+) colostomy bag Skin: Mild skin pigmentation from his previous rash. Lymphatics: Denies new lymphadenopathy or easy bruising Neurological:Denies numbness, tingling or new weaknesses Behavioral/Psych: Mood is stable, no new changes  All other systems were reviewed with the patient and are negative.  PHYSICAL EXAMINATION: ECOG PERFORMANCE STATUS: 1 - Symptomatic but completely ambulatory  Filed Vitals:   11/10/14 0844  BP: 138/86  Pulse: 82  Temp: 98.2 F (36.8 C)  Resp: 18   Filed Weights   11/10/14 0844  Weight: 206 lb 6.4 oz (93.622 kg)    GENERAL:alert, no distress and comfortable SKIN: skin color, texture, turgor are normal, healed rashes on her arms, legs, and trunk with skin pigmentation.  EYES: normal, conjunctiva are pink and non-injected, sclera clear OROPHARYNX:no exudate, no erythema and lips, buccal mucosa, and tongue normal  NECK: supple, thyroid normal size, non-tender, without nodularity LYMPH:  no palpable lymphadenopathy in the cervical, axillary or inguinal LUNGS: clear to auscultation and percussion with normal breathing effort HEART: regular rate & rhythm and no murmurs and no lower extremity edema ABDOMEN:abdomen soft, non-tender and normal bowel sounds. The perianal reconstruction site is much healed, with minimum clear discharge.  Musculoskeletal:no cyanosis of digits and no clubbing  PSYCH: alert & oriented x 3 with fluent speech NEURO: no focal motor/sensory deficits SKIN: A few resolving skin pigmentation from his previous rash  LABORATORY DATA:  I have  reviewed the data as listed Lab Results  Component Value Date   WBC 4.8 11/10/2014   HGB 11.4* 11/10/2014   HCT 33.1* 11/10/2014   MCV 100.6* 11/10/2014   PLT 229 11/10/2014    Recent Labs  04/23/14 0545 04/24/14 0420 04/25/14 0505  08/23/14 1013 09/20/14 0828 10/11/14 1154  NA 136* 133* 138  < > 135* 136 137  K 4.5 3.8 3.9  < > 4.5 4.4 4.2  CL 100 98 102  --   --   --   --   CO2 22 26 25   < > 24 21* 19*  GLUCOSE 299* 239* 250*  < > 234* 325* 149*  BUN 13 14 12   < > 15.6 24.2 24.5  CREATININE 0.89 0.86 0.74  < > 1.1 1.3 1.1  CALCIUM 8.0* 8.0* 7.9*  < > 9.3 9.4 9.3  GFRNONAA >90 >90 >90  --   --   --   --   GFRAA >90 >90 >90  --   --   --   --   PROT  --   --   --   < > 7.8 8.4* 7.7  ALBUMIN  --   --   --   < > 3.7 4.1 3.9  AST  --   --   --   < > 20 16 16   ALT  --   --   --   < > 22 23 19   ALKPHOS  --   --   --   < > 84 104 125  BILITOT  --   --   --   < > <0.20 0.25 <0.20  < > = values in this interval not displayed. PATHOLOGY REPORT 04/22/2014 Diagnosis 1. Soft tissue, biopsy, right pelvic sidewall; r/o malignancy - FIBROADIPOSE TISSUE, NO EVIDENCE OF MALIGNANCY. 2. Colon, segmental resection for tumor, rectum,sigmoid and anus - RECURRENT INVASIVE POORLY DIFFERENTIATED SQUAMOUS CELL CARCINOMA, INVADING THROUGH THE MUSCULARIS PROPRIA, INTO PERICOLONIC FATTY TISSUE, EXTENDING TO THE DEEP INKED MARGIN. - PERINEURAL INVASION AND ANGIOLYMPHATIC PRESENT. - SEVEN OF SEVENTEEN LYMPH NODES, POSITIVE FOR METASTATIC CARCINOMA (7/17) Microscopic Comment 2. Anus Specimen: Anus, rectum, sigmoid Procedure: Segmental resection Tumor site: Distal ends of specimen Specimen integrity: Intact Invasive tumor: Maximum size: At least 2.5 cm Histologic type(s): Invasive squamous cell carcinoma Histologic grade and differentiation: G2-3, moderately to poorly differentiated Microscopic extension of invasive tumor: Invading through the muscularis propria into pericolonic fatty tissue  involving adjacent skeletal muscle tissue Lymph-Vascular invasion: Present Peri-neural invasion: Present Resection margins: Deep margin is positive for tumor Treatment effect (neo-adjuvant therapy): Present, minimally Lymph nodes: number examined 17; number positive: 7 Pathologic Staging: ypT2, yN1 (R1) Ancillary studies: N/A  RADIOGRAPHIC STUDIES: I have personally reviewed the radiological images as listed and agreed with the findings in the report.  CT chest/abd/pelvis with contrast 10/11/2014 IMPRESSION: Interval development of new hypodense ill-defined hepatic lesions which, in the context of increased gastrohepatic, retroperitoneal, and pelvic sidewall lymphadenopathy, are highly suspicious for progressive metastatic disease.  No evidence for intrathoracic metastatic disease or acute abnormality.  Abdomen MRI w wo contrast 11/02/2014  IMPRESSION: 1. 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 2. The new smaller lesions within the right hepatic lobe are confirmed with nonspecific imaging/enhancement characteristics. Given the fact that these are new, and that there are enlarging lymph nodes within the porta hepatis, metastatic disease cannot be excluded. Correlation with tumor markers recommended. If metastatic disease remains uncertain clinically, further evaluation with PET-CT or short-term follow-up MRI could be performed. 3. Cholelithiasis without evidence of biliary dilatation.  ASSESSMENT & PLAN:  54 years old male with past medical history of anal squamous cell carcinoma, status post surgical resection and concurrent chemoradiation in 2009, now has local recurrence status post APR in the reconstruction.ypT2, yN1 (R1), unfortunately his deep surgical  margin was positive, and 7 out of 17 lymph nodes were positive also. No distant metastasis on the preop PET scan. His medical history is also complicated by diabetes, coronary artery disease status post stent  placement, and HIV on treatment. Performance status 1.  1. Local recurrence of anal squamous cell carcinoma, with positive surgical margines, new small liver lesions on scan 10/11/2014 -Per rad/onc, he is not a candidate for re-irradiation  -I discussed that the role of chemotherapy is to control the residual disease after surgery. Chemotherapy alone is unlikely going to be curative although. -He received chemotherapy with cisplatin and 5-FU regimen for total of 4 cycles -he has had moderate side effects from chemo: diarrhea, skin rash, mucositis and anorexia and tinnitus. Those are mostly resolved by now.  -I reviewed his restaging CT scan and MRI in person. A few centimeter new hypodense liver lesions are suspicious for cancer recurrence and metastasis. The largest 2.9 cm in the left lobe is likely a hemangioma, stable. Again, the MRI image was suspicious but not definitive. I recommend observation for now. Patient feels well, prefers to be observed also. -Follow-up in 3 months with a repeat his MRI of abdomen and pelvis  2. Diabetes -His blood sugar is not very well controlled, he has missed some insulin injections. He agrees to monitor his sugar closely at home and use insulin.   3. HIV  -Well controlled. Continue his treatment . His recent CD4 count was normal.   4. Hypertension and coronary artery disease  -He will continue follow-up with his primary care physician and cardiologist. Continue medications. I'll monitor his blood pressure, blood sugar during his chemotherapy. I discussed that we sometimes use steroids during chemotherapy treatment, which can cause hyperglycemia.   5. Urethra leakage -He now has his Foley removed, and is able to urinate without difficulty. -monitoring   6. depression -He denies suicidal. He is still quite depressed and frustrated about the side effects from chemotherapy, and colostomy bag. -His mood is quite good today. He is looking forward to making a  trip to [place] in July for a country music concert.  Plan -Return to clinic in 3 months with repeated MRI of abdomen and pelvis -Port flush in 6 weeks at the [facility]   All questions were answered. The patient knows to call the clinic with any problems, questions or concerns.   I spent 25 minutes counseling the patient face to face. The total time spent in the appointment was 30 minutes and more than 50% was on counseling.     Truitt Merle, MD 11/10/2014 9:12 AM

## 2014-11-10 NOTE — Telephone Encounter (Signed)
Printed sched pt will pick up from office

## 2014-11-15 ENCOUNTER — Telehealth: Payer: Self-pay | Admitting: *Deleted

## 2014-11-15 MED ORDER — HEPARIN SOD (PORK) LOCK FLUSH 100 UNIT/ML IV SOLN: 500.0000 [IU] | Freq: Once | INTRAVENOUS | Status: AC

## 2014-11-15 MED ORDER — SODIUM CHLORIDE 0.9 % IJ SOLN
10.0000 mL | INTRAMUSCULAR | Status: AC | PRN
Start: 2014-11-15 — End: ?

## 2014-11-15 MED ORDER — ALTEPLASE 2 MG IJ SOLR: 2.0000 mg | Freq: Once | INTRAMUSCULAR | Status: AC | PRN

## 2014-11-15 NOTE — Telephone Encounter (Signed)
TC from patient requesting some assistance with scheduling his portacath flushes at [facility]. He lives just 10 minutes from there and he would prefer his flush appts there. However, when [facility] called back (Dr. Ma Hillock 's nurse Judeen Hammans @ [phone]) she told him that he had to see Dr. Ma Hillock first before he could get flush appt. He sees no need to see Dr. Ma Hillock when he sees Dr. Burr Medico.  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  He is requesting assistance from this office to just get flush appts.

## 2014-11-15 NOTE — Addendum Note (Signed)
Addended by: Truitt Merle on: 11/15/2014 04:37 PM   Modules accepted: Orders, SmartSet

## 2014-11-15 NOTE — Telephone Encounter (Signed)
Notified pt of Dr Lewayne Bunting message that port will be scheduled & no need to see MD.

## 2014-11-15 NOTE — Telephone Encounter (Signed)
Please let pt know what I have communicated with [facility] and he will be scheduled for port flush on 6/29 without seeing MD there.  Thanks.  Krista Blue

## 2014-11-25 ENCOUNTER — Telehealth: Payer: Self-pay | Admitting: *Deleted

## 2014-11-25 NOTE — Telephone Encounter (Signed)
Oncology Nurse Navigator Documentation  Oncology Nurse Navigator Flowsheets 11/25/2014  Navigator Encounter Type 3 month;Telephone call--left VM for patient to call and MyChart message  Treatment Phase Post-Treatment

## 2014-12-01 ENCOUNTER — Other Ambulatory Visit: Payer: Self-pay | Admitting: *Deleted

## 2014-12-01 MED ORDER — ATORVASTATIN CALCIUM 80 MG PO TABS: 80.0000 mg | ORAL_TABLET | Freq: Every evening | ORAL | Status: DC

## 2014-12-16 ENCOUNTER — Encounter: Payer: Self-pay | Admitting: Cardiovascular Disease

## 2014-12-16 ENCOUNTER — Ambulatory Visit (INDEPENDENT_AMBULATORY_CARE_PROVIDER_SITE_OTHER): Payer: Managed Care, Other (non HMO) | Admitting: Cardiovascular Disease

## 2014-12-16 VITALS — BP 128/84 | HR 84 | Ht 70.0 in | Wt 211.5 lb

## 2014-12-16 DIAGNOSIS — E785 Hyperlipidemia, unspecified: Secondary | ICD-10-CM

## 2014-12-16 DIAGNOSIS — C211 Malignant neoplasm of anal canal: Secondary | ICD-10-CM | POA: Diagnosis not present

## 2014-12-16 DIAGNOSIS — I2510 Atherosclerotic heart disease of native coronary artery without angina pectoris: Secondary | ICD-10-CM | POA: Diagnosis not present

## 2014-12-16 DIAGNOSIS — I10 Essential (primary) hypertension: Secondary | ICD-10-CM

## 2014-12-16 NOTE — Patient Instructions (Signed)
You are doing well. No medication changes were made.  Please call us if you have new issues that need to be addressed before your next appt.  Your physician wants you to follow-up in: 6 months.  You will receive a reminder letter in the mail two months in advance. If you don't receive a letter, please call our office to schedule the follow-up appointment.   

## 2014-12-16 NOTE — Progress Notes (Signed)
Patient ID: Julian Palmer, male    DOB: 24-Sep-1960, 54 y.o.   MRN: [id number]  HPI Comments: Julian Palmer is a very pleasant 54 year old gentleman with a history of coronary artery disease, stent placed to his proximal LAD in 1998 with angioplasty to the mid and distal LAD, diabetes, hypertension who presents for routine followup of his coronary artery disease    History of rectal cancer, s/p resection on February 05 2011, followed by radiation of more than 30 cycles, chemotherapy for 2 weeks.  Recurrence of his cancer in late 2015  He had surgical evaluation 03/26/2014 that did not show clear margins unfortunately . He is undergone radical surgical resection, rectus flap reconstruction, now with ostomy He is completed several rounds of chemotherapy and radiation. He completed treatment with 5-FU and cisplatin, last chemotherapy April 2016. He reports that he has several spots on his liver, some are new. Currently not undergoing treatment  He has seen psychologist, psychiatry, is on antidepressants . Difficulty sleeping, on medications for sleep  Sugars have been running high Denies having any cardiac issues, no shortness of breath or chest pain concerning for ischemia  EKG on today's visit shows normal sinus rhythm, no significant ST or T-wave changes  Other past medical history Cardiac catheterization report from 1998 shows a totally occluded LAD after the first septal branch. Otherwise no other significant coronary artery disease. Balloon angioplasty was performed of the proximal mid LAD, stent placed to the proximal LAD with a 3.5 x 15 mm Multi-Link stent. Distal LAD was then angioplastied   Stress test in April 2010 was low risk scan, where he achieved 10 METS, with a very small region of mildly reduced perfusion is mildly reversible in the distal LAD territory    Outpatient Encounter Prescriptions as of 12/16/2014  Medication Sig  . aspirin (ASPIR-81) 81 MG EC tablet Take 81 mg by mouth  at bedtime.   Marland Kitchen atorvastatin (LIPITOR) 80 MG tablet Take 1 tablet (80 mg total) by mouth every evening.  . citalopram (CELEXA) 40 MG tablet Take 40 mg by mouth every morning.  Marland Kitchen efavirenz-emtrictabine-tenofovir (ATRIPLA) 600-200-300 MG per tablet Take 1 tablet by mouth at bedtime.   Marland Kitchen glimepiride (AMARYL) 2 MG tablet Take 4 mg by mouth daily with breakfast.   . glucose blood ([employer]S BLOOD GLUCOSE TEST STRIPS) test strip 1 each by Other route 2 (two) times daily.   Elmore Guise Devices ([employer]S LANCING DEVICE) MISC 1 each by Other route daily. Use 1 each once daily. Use as instructed.  Marland Kitchen losartan (COZAAR) 100 MG tablet TAKE 1 TABLET EVERY DAY  . metFORMIN (GLUCOPHAGE) 1000 MG tablet Take 1,000 mg by mouth 2 (two) times daily.    . Multiple Vitamins-Minerals (MULTIVITAL) tablet Take 1 tablet by mouth every evening.   . niacin (NIASPAN) 1000 MG CR tablet Take 1,000 mg by mouth at bedtime.  . Empagliflozin-Linagliptin (GLYXAMBI) 25-5 MG TABS Take 1 tablet by mouth daily.  . [DISCONTINUED] DPH-Lido-AlHydr-MgHydr-Simeth (FIRST-MOUTHWASH BLM) SUSP Swish and swallow 5 mLs 4 (four) times daily as needed.  . [DISCONTINUED] Empagliflozin-Linagliptin 25-5 MG TABS Take 25 mg by mouth daily.  . [DISCONTINUED] lidocaine-prilocaine (EMLA) cream Apply 1 application topically as needed. Apply to portacath 1 1/2 hours to 2 hours prior to procedures as needed. (Patient not taking: Reported on 11/10/2014)  . [DISCONTINUED] ondansetron (ZOFRAN) 8 MG tablet Take 1 tablet (8 mg total) by mouth every 8 (eight) hours as needed for nausea or vomiting. (Patient not taking: Reported  on 12/16/2014)  . [DISCONTINUED] oxyCODONE (OXY IR/ROXICODONE) 5 MG immediate release tablet Take 1-3 tablets (5-15 mg total) by mouth every 4 (four) hours as needed for moderate pain, severe pain or breakthrough pain. (Patient not taking: Reported on 11/10/2014)  . [DISCONTINUED] prochlorperazine (COMPAZINE) 10 MG tablet Take 1 tablet (10 mg total) by mouth  every 6 (six) hours as needed for nausea or vomiting. (Patient not taking: Reported on 10/18/2014)  . [DISCONTINUED] traMADol (ULTRAM) 50 MG tablet Take 1 tablet by mouth every 6 (six) hours as needed.   Facility-Administered Encounter Medications as of 12/16/2014  Medication  . heparin lock flush 100 unit/mL  . sodium chloride 0.9 % injection 10 mL    Review of Systems  Constitutional: Negative.   Respiratory: Negative.   Cardiovascular: Negative.   Gastrointestinal: Negative.   Genitourinary:       Erectile dysfunction  Musculoskeletal: Negative.   Neurological: Negative.   Hematological: Negative.   Psychiatric/Behavioral: Negative.   All other systems reviewed and are negative.   BP 128/84 mmHg  Pulse 84  Ht 5\' 10"  (1.778 m)  Wt 211 lb 8 oz (95.936 kg)  BMI 30.35 kg/m2  Physical Exam  Constitutional: He is oriented to person, place, and time. He appears well-developed and well-nourished.  HENT:  Head: Normocephalic.  Nose: Nose normal.  Mouth/Throat: Oropharynx is clear and moist.  Eyes: Conjunctivae are normal. Pupils are equal, round, and reactive to light.  Neck: Normal range of motion. Neck supple. No JVD present.  Cardiovascular: Normal rate, regular rhythm, S1 normal, S2 normal, normal heart sounds and intact distal pulses.  Exam reveals no gallop and no friction rub.   No murmur heard. Pulmonary/Chest: Effort normal and breath sounds normal. No respiratory distress. He has no wheezes. He has no rales. He exhibits no tenderness.  Abdominal: Soft. Bowel sounds are normal. He exhibits no distension. There is no tenderness.  Musculoskeletal: Normal range of motion. He exhibits no edema or tenderness.  Lymphadenopathy:    He has no cervical adenopathy.  Neurological: He is alert and oriented to person, place, and time. Coordination normal.  Skin: Skin is warm and dry. No rash noted. No erythema.  Psychiatric: He has a normal mood and affect. His behavior is normal.  Judgment and thought content normal.      Assessment and Plan   Nursing note and vitals reviewed.

## 2014-12-16 NOTE — Assessment & Plan Note (Signed)
Recent radical resection, reconstruction, completed chemotherapy and radiation He reports having spots on his liver

## 2014-12-16 NOTE — Assessment & Plan Note (Signed)
Currently with no symptoms of angina. No further workup at this time. Continue current medication regimen. 

## 2014-12-16 NOTE — Assessment & Plan Note (Signed)
Blood pressure is well controlled on today's visit. No changes made to the medications. 

## 2014-12-16 NOTE — Assessment & Plan Note (Signed)
Encouraged him to stay on his current cholesterol medication. Cholesterol close to goal

## 2014-12-22 ENCOUNTER — Inpatient Hospital Stay: Payer: Managed Care, Other (non HMO) | Attending: Internal Medicine

## 2014-12-22 DIAGNOSIS — Z85048 Personal history of other malignant neoplasm of rectum, rectosigmoid junction, and anus: Secondary | ICD-10-CM | POA: Insufficient documentation

## 2014-12-22 DIAGNOSIS — Z452 Encounter for adjustment and management of vascular access device: Secondary | ICD-10-CM | POA: Diagnosis not present

## 2014-12-22 DIAGNOSIS — Z923 Personal history of irradiation: Secondary | ICD-10-CM | POA: Diagnosis not present

## 2014-12-22 DIAGNOSIS — Z9221 Personal history of antineoplastic chemotherapy: Secondary | ICD-10-CM | POA: Diagnosis not present

## 2014-12-22 DIAGNOSIS — C801 Malignant (primary) neoplasm, unspecified: Secondary | ICD-10-CM

## 2014-12-22 MED ORDER — HEPARIN SOD (PORK) LOCK FLUSH 100 UNIT/ML IV SOLN
500.0000 [IU] | Freq: Once | INTRAVENOUS | Status: AC
  Administered 2014-12-22: 500 [IU] via INTRAVENOUS
  Filled 2014-12-22: qty 5

## 2014-12-22 MED ORDER — SODIUM CHLORIDE 0.9 % IJ SOLN
10.0000 mL | INTRAMUSCULAR | Status: DC | PRN
  Administered 2014-12-22: 10 mL via INTRAVENOUS
  Filled 2014-12-22: qty 10

## 2015-01-06 ENCOUNTER — Other Ambulatory Visit: Payer: Self-pay

## 2015-01-06 MED ORDER — NIACIN ER (ANTIHYPERLIPIDEMIC) 1000 MG PO TBCR: 1000.0000 mg | EXTENDED_RELEASE_TABLET | Freq: Every day | ORAL | Status: DC

## 2015-01-06 NOTE — Telephone Encounter (Signed)
Refill sent for Niacin ER 1,000 mg take one tablet at bedtime.

## 2015-01-21 ENCOUNTER — Other Ambulatory Visit: Payer: Self-pay | Admitting: Cardiovascular Disease

## 2015-02-04 ENCOUNTER — Telehealth: Payer: Self-pay | Admitting: Hematology

## 2015-02-04 NOTE — Telephone Encounter (Signed)
Due to bmdc schedule change 8/17 appointments moved to 8/26. Left message for patient and mailed schedule.

## 2015-02-09 ENCOUNTER — Other Ambulatory Visit: Payer: Managed Care, Other (non HMO)

## 2015-02-09 ENCOUNTER — Ambulatory Visit: Payer: Managed Care, Other (non HMO) | Admitting: Hematology

## 2015-02-18 ENCOUNTER — Other Ambulatory Visit (HOSPITAL_BASED_OUTPATIENT_CLINIC_OR_DEPARTMENT_OTHER): Payer: Managed Care, Other (non HMO)

## 2015-02-18 ENCOUNTER — Telehealth: Payer: Self-pay | Admitting: Hematology

## 2015-02-18 ENCOUNTER — Encounter: Payer: Self-pay | Admitting: *Deleted

## 2015-02-18 ENCOUNTER — Encounter: Payer: Self-pay | Admitting: Hematology

## 2015-02-18 ENCOUNTER — Ambulatory Visit (HOSPITAL_BASED_OUTPATIENT_CLINIC_OR_DEPARTMENT_OTHER): Payer: Managed Care, Other (non HMO)

## 2015-02-18 ENCOUNTER — Ambulatory Visit (HOSPITAL_BASED_OUTPATIENT_CLINIC_OR_DEPARTMENT_OTHER): Payer: Managed Care, Other (non HMO) | Admitting: Hematology

## 2015-02-18 VITALS — BP 146/74 | HR 81 | Temp 98.6°F | Resp 18 | Ht 70.0 in | Wt 211.0 lb

## 2015-02-18 DIAGNOSIS — C210 Malignant neoplasm of anus, unspecified: Secondary | ICD-10-CM

## 2015-02-18 DIAGNOSIS — C211 Malignant neoplasm of anal canal: Secondary | ICD-10-CM

## 2015-02-18 DIAGNOSIS — B20 Human immunodeficiency virus [HIV] disease: Secondary | ICD-10-CM

## 2015-02-18 DIAGNOSIS — K769 Liver disease, unspecified: Secondary | ICD-10-CM

## 2015-02-18 DIAGNOSIS — E119 Type 2 diabetes mellitus without complications: Secondary | ICD-10-CM

## 2015-02-18 DIAGNOSIS — F329 Major depressive disorder, single episode, unspecified: Secondary | ICD-10-CM

## 2015-02-18 DIAGNOSIS — Z95828 Presence of other vascular implants and grafts: Secondary | ICD-10-CM

## 2015-02-18 LAB — CBC WITH DIFFERENTIAL/PLATELET
BASO%: 1.1 % (ref 0.0–2.0)
Basophils Absolute: 0.1 10*3/uL (ref 0.0–0.1)
EOS ABS: 0.2 10*3/uL (ref 0.0–0.5)
EOS%: 4.4 % (ref 0.0–7.0)
HCT: 36.8 % — ABNORMAL LOW (ref 38.4–49.9)
HGB: 12.4 g/dL — ABNORMAL LOW (ref 13.0–17.1)
LYMPH%: 32.9 % (ref 14.0–49.0)
MCH: 30.6 pg (ref 27.2–33.4)
MCHC: 33.8 g/dL (ref 32.0–36.0)
MCV: 90.5 fL (ref 79.3–98.0)
MONO#: 0.7 10*3/uL (ref 0.1–0.9)
MONO%: 12.9 % (ref 0.0–14.0)
NEUT#: 2.5 10*3/uL (ref 1.5–6.5)
NEUT%: 48.7 % (ref 39.0–75.0)
PLATELETS: 213 10*3/uL (ref 140–400)
RBC: 4.06 10*6/uL — AB (ref 4.20–5.82)
RDW: 14 % (ref 11.0–14.6)
WBC: 5.1 10*3/uL (ref 4.0–10.3)
lymph#: 1.7 10*3/uL (ref 0.9–3.3)

## 2015-02-18 LAB — COMPREHENSIVE METABOLIC PANEL (CC13)
ALBUMIN: 4 g/dL (ref 3.5–5.0)
ALT: 21 U/L (ref 0–55)
ANION GAP: 9 meq/L (ref 3–11)
AST: 25 U/L (ref 5–34)
Alkaline Phosphatase: 85 U/L (ref 40–150)
BUN: 25.9 mg/dL (ref 7.0–26.0)
CO2: 25 meq/L (ref 22–29)
Calcium: 9.8 mg/dL (ref 8.4–10.4)
Chloride: 106 mEq/L (ref 98–109)
Creatinine: 1.3 mg/dL (ref 0.7–1.3)
EGFR: 64 mL/min/{1.73_m2} — AB (ref >90)
Glucose: 130 mg/dl (ref 70–140)
Potassium: 4.5 mEq/L (ref 3.5–5.1)
Sodium: 140 mEq/L (ref 136–145)
Total Bilirubin: 0.21 mg/dL (ref 0.20–1.20)
Total Protein: 8.1 g/dL (ref 6.4–8.3)

## 2015-02-18 LAB — MAGNESIUM (CC13): MAGNESIUM: 2.3 mg/dL (ref 1.5–2.5)

## 2015-02-18 MED ORDER — SODIUM CHLORIDE 0.9 % IJ SOLN
10.0000 mL | INTRAMUSCULAR | Status: DC | PRN
  Administered 2015-02-18: 10 mL via INTRAVENOUS
  Filled 2015-02-18: qty 10

## 2015-02-18 MED ORDER — HEPARIN SOD (PORK) LOCK FLUSH 100 UNIT/ML IV SOLN
500.0000 [IU] | Freq: Once | INTRAVENOUS | Status: AC
Start: 2015-02-18 — End: 2015-02-18
  Administered 2015-02-18: 500 [IU] via INTRAVENOUS
  Filled 2015-02-18: qty 5

## 2015-02-18 NOTE — Progress Notes (Signed)
Oncology Nurse Navigator Documentation  Oncology Nurse Navigator Flowsheets 02/18/2015  Navigator Encounter Type 6 month  Patient Visit Type Medonc  Treatment Phase S/P tx  Barriers/Navigation Needs No barriers at this time  Support Groups/Services GI;Ostomy support group  Time Spent with Patient 10  Spirits much better and feeling strong. Returned to work and normal routine. The country music event in [place] was "great". Will be seeing ostomy nurse in October to try and train his ostomy-looking forward to this. Tells RN he has planned his funeral. Reports this was very freeing for him-his friend has all details. Supportive listening provided.

## 2015-02-18 NOTE — Telephone Encounter (Signed)
Pt confirmed labs/ov per 08/26 POF, gave pt AVS and Calendar... KJ °

## 2015-02-18 NOTE — Patient Instructions (Signed)

## 2015-02-18 NOTE — Progress Notes (Signed)
Julian Palmer NOTE  Patient Care Team: [name], MD as PCP - General (Infectious Diseases) Robert Bellow, MD (General Surgery) Leighton Ruff, MD as Consulting Physician (General Surgery) Irene Limbo, MD as Consulting Physician (Plastic Surgery) Truitt Merle, MD as Consulting Physician (Hematology) Festus Aloe, MD as Consulting Physician (Urology)  DIAGNOSIS Recurrent anal cancer    Primary cancer of anal canal   02/05/2011 Initial Diagnosis Primary cancer of anal canal, T1N0M0, s/p surgical resection with positive margines. He received definitive dose RT 45 Gy over 5 weeks with 16Gy boosting and concurrent chemo with 5FU, treated at [facility] by Drs.  Geoffreyh and [employer].    11/23/2013 Relapse/Recurrence local recurrence    04/21/2014 Surgery abdominal perianal resection (APR) and VRAM flap closure of perineal wound on 04/21/14, surgical margins were positive for cancer    06/28/2014 - 09/25/2014 Chemotherapy 5-FU 1000mg /m2/day on D1-5, ciplatin 100mg /m2 on D2, every 28 days, s/p 4 cycles    10/11/2014 Progression CT CAP showed new liver lesions and abd/pelc adenopathy, highly suspecious for progressive metastatic disease    11/02/2014 Imaging abdomen MRI w wo contrast showed new small lesions in the right hepatic lobe with nonspecific enhancement, and enlarging nodes in the porta hepatis, metastatic disease not excluded      OTHER RELATED ISSUES: 1. (+) HIV, on therapy with good control  2. CAD, s/p stent placement in [place]: Observation   INTERIM HISTORY: Rishon returns for follow up. He had a great trip to the [place] in [place] last months, and he really enjoyed it. Overall he feels much better, with better appetite and energy level, eating well, and gained some weight. He denies any significant pain, nausea, or other symptoms. He is back to work full-time. He has made appointment with colostomy  clinic nurse to render out to irrigate the colostomy bag. No other complaints.  MEDICAL HISTORY:  Past Medical History  Diagnosis Date  . S/p bare metal coronary artery stent     1998  pLAD  . Hypertension   . 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 Hyperlipidemia   . History of carcinoma in situ of anal canal     2010  . ED (erectile dysfunction)   . Sigmoid diverticulosis   . PONV (postoperative nausea and vomiting)   . Wears glasses   . At risk for sleep apnea     STOP-BANG= 4     SENT TO PCP 03-23-2014  . Coronary artery disease CARDIOLOGIST--  DR Rockey Situ --  LAST LOV 04-17-2013    S/P  PCI to mid and distal LAD and BM stent x1 to pLAD  . Anxiety   . Depression   . HIV positive   . Recurrent squamous cell carcinoma of anus     removal mass 01-08-2014  . 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 Type 2 diabetes mellitus     SURGICAL HISTORY: Past Surgical History  Procedure Laterality Date  . Septoplasty  1985  . Sphincterotomy  2009  . Cardiovascular stress test  04/ 2010    low risk study/  very small region of mildly reduced perfersion is mildly reversible dLAD territory  . Resection anal mass  02-05-2011  . Excision anal polyp  2009  . Re-excision anal squamous  cell carcinoma  2010  . Removal rectal mass  01-08-2014  . Eua w/ bx perianal skin  06-08-2009  . Port-a-cath placement  02-21-2011    REMOVED  2013  . Coronary angioplasty with stent placement  1998   [facility]    BM stent to pLAD and PCI to Mid and Distal LAD/  no other significant cad  . Inguinal hernia repair Bilateral 1988  . Colonoscopy with propofol  05-28-2008  . Mass excision N/A 03/26/2014    Procedure: ANAL EXAM UNDER ANESTHESIA,EXCISIONAL BIOPSY OF ANAL MASS;  Surgeon: Leighton Ruff, MD;  Location: [facility];  Service: General;   Laterality: N/A;  . Laparoscopic assisted abdominal perineal resection N/A 04/22/2014    Procedure: LAPAROSCOPIC  ABDOMINAL PERINEAL RESECTION, ;  Surgeon: Leighton Ruff, MD;  Location: [facility];  Service: General;  Laterality: N/A;  . Urethrotomy Bilateral 04/22/2014    Procedure: CYSTOSCOPY/URETHROTOMY;  Surgeon: Leighton Ruff, MD;  Location: [facility];  Service: General;  Laterality: Bilateral;  . Muscle flap closure N/A 04/22/2014    Procedure: VRAM FLAP;  Surgeon: Irene Limbo, MD;  Location: [facility];  Service: Plastics;  Laterality: N/A;  . Portacath placement Left 06/24/2014    Procedure: INSERTION PORT-A-CATH LEFT SUBCLAVIAN;  Surgeon: Leighton Ruff, MD;  Location: [facility];  Service: General;  Laterality: Left;    SOCIAL HISTORY: Social History   Social History  . Marital Status: Single    Spouse Name: N/A  . Number of Children: 0  . Years of Education: N/A   Occupational History  .  [employer] History Main Topics  . Smoking status: Never Smoker   . Smokeless tobacco: Never Used  . Alcohol Use: No     Comment: RARE  . Drug Use: No  . Sexual Activity: Not on file   Other Topics Concern  . Not on file   Social History Narrative   Full time. Single. Does not regularly exercise.     FAMILY HISTORY: Family History  Problem Relation Age of Onset  . Diabetes type II Mother   . Breast cancer Mother 71     survivor  . Hypertension Mother   . Coronary artery disease Father   Paternal GM had utrine cancer, no other history of malignancy   ALLERGIES:  is allergic to sulfa antibiotics.  MEDICATIONS:  Current Outpatient Prescriptions  Medication Sig Dispense Refill  . aspirin (ASPIR-81) 81 MG EC tablet Take 81 mg by mouth at bedtime.     Marland Kitchen atorvastatin (LIPITOR) 80 MG tablet Take 1 tablet (80 mg total) by mouth every evening. 90 tablet 0  . citalopram (CELEXA) 40 MG tablet Take 40 mg by mouth every morning.    Marland Kitchen efavirenz-emtrictabine-tenofovir  (ATRIPLA) 600-200-300 MG per tablet Take 1 tablet by mouth at bedtime.     . Empagliflozin-Linagliptin (GLYXAMBI) 25-5 MG TABS Take 1 tablet by mouth daily.    Marland Kitchen glimepiride (AMARYL) 2 MG tablet Take 4 mg by mouth daily with breakfast.     . glucose blood ([employer] BLOOD GLUCOSE TEST STRIPS) test strip 1 each by Other route 2 (two) times daily.     Elmore Guise Devices ([employer] LANCING DEVICE) MISC 1 each by Other route daily. Use 1 each once daily. Use as instructed.    Marland Kitchen losartan (COZAAR) 100 MG tablet TAKE 1 TABLET EVERY DAY 90 tablet 3  . metFORMIN (GLUCOPHAGE) 1000 MG tablet Take 1,000 mg by mouth 2 (two) times daily.      Marland Kitchen  Multiple Vitamins-Minerals (MULTIVITAL) tablet Take 1 tablet by mouth every evening.     . niacin (NIASPAN) 1000 MG CR tablet Take 1 tablet (1,000 mg total) by mouth at bedtime. 90 tablet 3   No current facility-administered medications for this visit.   Facility-Administered Medications Ordered in Other Visits  Medication Dose Route Frequency Provider Last Rate Last Dose  . heparin lock flush 100 unit/mL  500 Units Intravenous Once Truitt Merle, MD      . sodium chloride 0.9 % injection 10 mL  10 mL Intravenous PRN Truitt Merle, MD      . sodium chloride 0.9 % injection 10 mL  10 mL Intravenous PRN Truitt Merle, MD   10 mL at 02/18/15 0827    REVIEW OF SYSTEMS:   Constitutional: Denies fevers, chills or abnormal night sweats Eyes: Denies blurriness of vision, double vision or watery eyes Ears, nose, mouth, throat, and face: Denies mucositis or sore throat Respiratory: Denies cough, dyspnea or wheezes Cardiovascular: Denies palpitation, chest discomfort or lower extremity swelling Gastrointestinal:  Denies nausea, heartburn or change in bowel habits, (+) colostomy bag Skin: Mild skin pigmentation from his previous rash. Lymphatics: Denies new lymphadenopathy or easy bruising Neurological:Denies numbness, tingling or new weaknesses Behavioral/Psych: Mood is stable, no new changes  All  other systems were reviewed with the patient and are negative.  PHYSICAL EXAMINATION: ECOG PERFORMANCE STATUS: 0  Filed Vitals:   02/18/15 0853  BP: 146/74  Pulse: 81  Temp: 98.6 F (37 C)  Resp: 18   Filed Weights   02/18/15 0853  Weight: 211 lb (95.709 kg)    GENERAL:alert, no distress and comfortable SKIN: skin color, texture, turgor are normal, healed rashes on her arms, legs, and trunk with skin pigmentation.  EYES: normal, conjunctiva are pink and non-injected, sclera clear OROPHARYNX:no exudate, no erythema and lips, buccal mucosa, and tongue normal  NECK: supple, thyroid normal size, non-tender, without nodularity LYMPH:  no palpable lymphadenopathy in the cervical, axillary or inguinal LUNGS: clear to auscultation and percussion with normal breathing effort HEART: regular rate & rhythm and no murmurs and no lower extremity edema ABDOMEN:abdomen soft, non-tender and normal bowel sounds. (+) Colostomy bag. The perianal reconstruction site is completely healed.  Musculoskeletal:no cyanosis of digits and no clubbing  PSYCH: alert & oriented x 3 with fluent speech NEURO: no focal motor/sensory deficits SKIN: A few resolving skin pigmentation from his previous rash  LABORATORY DATA:  I have reviewed the data as listed Lab Results  Component Value Date   WBC 5.1 02/18/2015   HGB 12.4* 02/18/2015   HCT 36.8* 02/18/2015   MCV 90.5 02/18/2015   PLT 213 02/18/2015    Recent Labs  04/23/14 0545 04/24/14 0420 04/25/14 0505  10/11/14 1154 11/10/14 0809 02/18/15 0812  NA 136* 133* 138  < > 137 138 140  K 4.5 3.8 3.9  < > 4.2 4.1 4.5  CL 100 98 102  --   --   --   --   CO2 22 26 25   < > 19* 23 25  GLUCOSE 299* 239* 250*  < > 149* 113 130  BUN 13 14 12   < > 24.5 20.9 25.9  CREATININE 0.89 0.86 0.74  < > 1.1 1.1 1.3  CALCIUM 8.0* 8.0* 7.9*  < > 9.3 9.5 9.8  GFRNONAA >90 >90 >90  --   --   --   --   GFRAA >90 >90 >90  --   --   --   --  PROT  --   --   --   < > 7.7  7.9 8.1  ALBUMIN  --   --   --   < > 3.9 3.8 4.0  AST  --   --   --   < > 16 22 25   ALT  --   --   --   < > 19 22 21   ALKPHOS  --   --   --   < > 125 92 85  BILITOT  --   --   --   < > <0.20 0.24 0.21  < > = values in this interval not displayed. PATHOLOGY REPORT 04/22/2014 Diagnosis 1. Soft tissue, biopsy, right pelvic sidewall; r/o malignancy - FIBROADIPOSE TISSUE, NO EVIDENCE OF MALIGNANCY. 2. Colon, segmental resection for tumor, rectum,sigmoid and anus - RECURRENT INVASIVE POORLY DIFFERENTIATED SQUAMOUS CELL CARCINOMA, INVADING THROUGH THE MUSCULARIS PROPRIA, INTO PERICOLONIC FATTY TISSUE, EXTENDING TO THE DEEP INKED MARGIN. - PERINEURAL INVASION AND ANGIOLYMPHATIC PRESENT. - SEVEN OF SEVENTEEN LYMPH NODES, POSITIVE FOR METASTATIC CARCINOMA (7/17) Microscopic Comment 2. Anus Specimen: Anus, rectum, sigmoid Procedure: Segmental resection Tumor site: Distal ends of specimen Specimen integrity: Intact Invasive tumor: Maximum size: At least 2.5 cm Histologic type(s): Invasive squamous cell carcinoma Histologic grade and differentiation: G2-3, moderately to poorly differentiated Microscopic extension of invasive tumor: Invading through the muscularis propria into pericolonic fatty tissue involving adjacent skeletal muscle tissue Lymph-Vascular invasion: Present Peri-neural invasion: Present Resection margins: Deep margin is positive for tumor Treatment effect (neo-adjuvant therapy): Present, minimally Lymph nodes: number examined 17; number positive: 7 Pathologic Staging: ypT2, yN1 (R1) Ancillary studies: N/A  RADIOGRAPHIC STUDIES: I have personally reviewed the radiological images as listed and agreed with the findings in the report.  CT chest/abd/pelvis with contrast 10/11/2014 IMPRESSION: Interval development of new hypodense ill-defined hepatic lesions which, in the context of increased gastrohepatic, retroperitoneal, and pelvic sidewall lymphadenopathy, are highly  suspicious for progressive metastatic disease.  No evidence for intrathoracic metastatic disease or acute abnormality.  Abdomen MRI w wo contrast 11/02/2014  IMPRESSION: 1. 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 2. The new smaller lesions within the right hepatic lobe are confirmed with nonspecific imaging/enhancement characteristics. Given the fact that these are new, and that there are enlarging lymph nodes within the porta hepatis, metastatic disease cannot be excluded. Correlation with tumor markers recommended. If metastatic disease remains uncertain clinically, further evaluation with PET-CT or short-term follow-up MRI could be performed. 3. Cholelithiasis without evidence of biliary dilatation.  ASSESSMENT & PLAN:  54 years old male with past medical history of anal squamous cell carcinoma, status post surgical resection and concurrent chemoradiation in 2009, now has local recurrence status post APR in the reconstruction.ypT2, yN1 (R1), unfortunately his deep surgical margin was positive, and 7 out of 17 lymph nodes were positive also. No distant metastasis on the preop PET scan. His medical history is also complicated by diabetes, coronary artery disease status post stent placement, and HIV on treatment. Performance status 1.  1. Local recurrence of anal squamous cell carcinoma, with positive surgical margines, new small liver lesions on scan 10/11/2014 -Per rad/onc, he is not a candidate for re-irradiation  -I discussed that the role of chemotherapy is to control the residual disease after surgery. Chemotherapy alone is unlikely going to be curative although. -He received chemotherapy with cisplatin and 5-FU regimen for total of 4 cycles -he has had moderate side effects from chemo: diarrhea, skin rash, mucositis and anorexia and tinnitus. Those  are mostly resolved by now.  -I reviewed his restaging CT scan and MRI in person. A few centimeter new hypodense liver  lesions are suspicious for cancer recurrence and metastasis. The largest 2.9 cm in the left lobe is likely a hemangioma, stable. Again, the MRI image was suspicious but not definitive. I recommend observation for now. Patient feels well, prefers to be observed also. -He is clinically doing well, I'll obtain a PET scan for restaging in the next few weeks.  2. Diabetes -His blood sugar is not very well controlled, he has recently had on one more oral medication. He agrees to monitor his sugar closely at home and use insulin.   3. HIV  -Well controlled. Continue his treatment . His recent CD4 count was normal.   4. Hypertension and coronary artery disease  -He will continue follow-up with his primary care physician and cardiologist. Continue medications. I'll monitor his blood pressure, blood sugar during his chemotherapy. I discussed that we sometimes use steroids during chemotherapy treatment, which can cause hyperglycemia.   5. Urethra leakage -He now has his Foley removed, and is able to urinate without difficulty. -monitoring   6. depression -He denies suicidal. He is still quite depressed and frustrated about the side effects from chemotherapy, and colostomy bag. -His mood is quite good today. He is looking forward to making a trip to [place] in July for a country music concert.  Plan -PET in the next 2-3 weeks -RTC after the PET scan, or in 3 month if PET negative.    All questions were answered. The patient knows to call the clinic with any problems, questions or concerns.   I spent 25 minutes counseling the patient face to face. The total time spent in the appointment was 30 minutes and more than 50% was on counseling.     Truitt Merle, MD 02/18/2015 9:38 AM

## 2015-02-24 ENCOUNTER — Ambulatory Visit
Admission: RE | Admit: 2015-02-24 | Discharge: 2015-02-24 | Disposition: A | Discharge status: Home / Self Care | Payer: Managed Care, Other (non HMO) | Source: Ambulatory Visit | Attending: Hematology | Admitting: Hematology

## 2015-02-24 DIAGNOSIS — C7951 Secondary malignant neoplasm of bone: Secondary | ICD-10-CM | POA: Diagnosis not present

## 2015-02-24 DIAGNOSIS — C786 Secondary malignant neoplasm of retroperitoneum and peritoneum: Secondary | ICD-10-CM | POA: Insufficient documentation

## 2015-02-24 DIAGNOSIS — C210 Malignant neoplasm of anus, unspecified: Secondary | ICD-10-CM | POA: Insufficient documentation

## 2015-02-24 LAB — GLUCOSE, CAPILLARY: Glucose-Capillary: 173 mg/dL — ABNORMAL HIGH (ref 65–99)

## 2015-02-24 MED ORDER — FLUDEOXYGLUCOSE F - 18 (FDG) INJECTION
13.0500 | Freq: Once | INTRAVENOUS | Status: DC | PRN
  Administered 2015-02-24: 13.05 via INTRAVENOUS
  Filled 2015-02-24: qty 13.05

## 2015-03-01 ENCOUNTER — Telehealth: Payer: Self-pay | Admitting: *Deleted

## 2015-03-01 ENCOUNTER — Other Ambulatory Visit: Payer: Self-pay | Admitting: Hematology

## 2015-03-01 DIAGNOSIS — C211 Malignant neoplasm of anal canal: Secondary | ICD-10-CM

## 2015-03-01 NOTE — Telephone Encounter (Signed)
Received vm from pt asking for results of CT.  Note to Dr Burr Medico.

## 2015-03-01 NOTE — Telephone Encounter (Signed)
I called back and discussed his PET scan findings. I recommend to get a needle biopsy of the presacral lesion to rule out metastasis. He agrees with it, I will set up with IR. I will see him after his biopsy. POF and orders placed.   Truitt Merle  03/01/2015

## 2015-03-01 NOTE — Telephone Encounter (Signed)
PET SCAN WAS DONE ON 02/24/15. PT. HAS AN APPOINTMENT WITH DR.FENG ON 03/04/15.

## 2015-03-02 ENCOUNTER — Telehealth: Payer: Self-pay | Admitting: Hematology

## 2015-03-02 NOTE — Telephone Encounter (Signed)
Called  Patient and he is aware of his follow up

## 2015-03-04 ENCOUNTER — Ambulatory Visit: Payer: Managed Care, Other (non HMO) | Admitting: Hematology

## 2015-03-07 ENCOUNTER — Other Ambulatory Visit: Payer: Self-pay | Admitting: Hematology

## 2015-03-07 ENCOUNTER — Telehealth: Payer: Self-pay | Admitting: *Deleted

## 2015-03-07 DIAGNOSIS — C211 Malignant neoplasm of anal canal: Secondary | ICD-10-CM

## 2015-03-07 NOTE — Telephone Encounter (Signed)
Please let pt know that I will be happy to make the referral. I will also reschedule his appointment with me.   Julian Palmer  03/07/2015

## 2015-03-07 NOTE — Telephone Encounter (Signed)
PT. HAS TALKED TO A NEIGHBOR WHO SPOKE TO DR.Derinda Sis AT [facility]. HE RECOMMENDED DR Marland Kitchen CALVO AT [facility] FOR ANOTHER OPINION ON PT.'S CASE. PT. WOULD LIKE HIS RECORDS SENT TO DR.CALVO AT [facility] FOR HIS OPINION CONCERNING TREATMENT. ALSO PT.'S 03/14/15 APPOINTMENT WITH DR.FENG NEEDS TO BE RESCHEDULED TO ANOTHER DATE SINCE THE BIOPSY HAS BEEN RESCHEDULED TO 03/15/15.

## 2015-03-08 ENCOUNTER — Telehealth: Payer: Self-pay | Admitting: Hematology

## 2015-03-08 NOTE — Telephone Encounter (Signed)
Per 9/12 pof moved 9/19 f/u to 9/27 - no availability 9/23 or 9/26. Spoke with patient he is aware. inbox referral message to HIM re referral to [facility]. Patient aware he will be contacted re appointment at [facility].

## 2015-03-09 ENCOUNTER — Ambulatory Visit: Payer: Managed Care, Other (non HMO)

## 2015-03-10 ENCOUNTER — Telehealth: Payer: Self-pay | Admitting: Hematology

## 2015-03-10 NOTE — Telephone Encounter (Signed)
Pt appt to see Dr. Cloyd Stagers  Is 03/18/15. Chelsea from Dr. Dossie Arbour office will call pt with the time. Medical records faxed. Slides and scans will be fedex'ed

## 2015-03-10 NOTE — Progress Notes (Unsigned)
Isabel ostomy consult note:  Late entry for visit made on 03/09/15 Visit made per Dr. Marcello Moores' request to teach colostomy irrigation Stoma type/location: LLQ colostomy Stomal assessment/size: 1 and 1/4 round, round, moist Peristomal assessment: intact, clear Treatment options for stomal/peristomal skin: thin skin barrier ring Output soft brown stool Ostomy pouching: 2pc., 1 and 3/4 inch pouching system Education provided: Extended session for instruction regarding colostomy irrigation.  System assembled, procedure demonstrated and questions answered. 757ml of warm tap water instilled via cone into stoma after first assessing stoma limb for direction beneath skin surface. Limb ascends toward left hip. Return of all irrigant fluid and a large amount of soft brown stool. Materials cleansed and packaged for patient to take home. Enrolled patient in [place] program: Yes (previously).  Called [employer]  to order colostomy irrigation supplies (sleeves to fit the 1 and 3/4 inch pouching system, closed end pouches). Patient expresses gratitude for support and instruction. [facility] nursing team will not follow routinely, but will remain available to this patient, the surgery and medical teams.  Please re-consult if needed. Thanks, Maudie Flakes, MSN, RN, [place], [place], [place] [phone])  Time spent this encounter = 120 minutes

## 2015-03-11 ENCOUNTER — Ambulatory Visit (HOSPITAL_COMMUNITY)
Admission: RE | Admit: 2015-03-11 | Discharge: 2015-03-11 | Disposition: A | Discharge status: Home / Self Care | Payer: Managed Care, Other (non HMO) | Source: Ambulatory Visit | Attending: Hematology | Admitting: Hematology

## 2015-03-11 ENCOUNTER — Ambulatory Visit (HOSPITAL_COMMUNITY): Admission: RE | Admit: 2015-03-11 | Payer: Managed Care, Other (non HMO) | Source: Ambulatory Visit

## 2015-03-14 ENCOUNTER — Other Ambulatory Visit: Payer: Self-pay | Admitting: Radiology

## 2015-03-14 ENCOUNTER — Ambulatory Visit: Payer: Managed Care, Other (non HMO) | Admitting: Hematology

## 2015-03-15 ENCOUNTER — Ambulatory Visit (HOSPITAL_COMMUNITY)
Admission: RE | Admit: 2015-03-15 | Discharge: 2015-03-15 | Disposition: A | Discharge status: Home / Self Care | Payer: Managed Care, Other (non HMO) | Source: Ambulatory Visit | Attending: Hematology | Admitting: Hematology

## 2015-03-15 ENCOUNTER — Encounter (HOSPITAL_COMMUNITY): Payer: Self-pay

## 2015-03-15 DIAGNOSIS — N529 Male erectile dysfunction, unspecified: Secondary | ICD-10-CM | POA: Diagnosis not present

## 2015-03-15 DIAGNOSIS — C211 Malignant neoplasm of anal canal: Secondary | ICD-10-CM

## 2015-03-15 DIAGNOSIS — F329 Major depressive disorder, single episode, unspecified: Secondary | ICD-10-CM | POA: Insufficient documentation

## 2015-03-15 DIAGNOSIS — Z21 Asymptomatic human immunodeficiency virus [HIV] infection status: Secondary | ICD-10-CM | POA: Diagnosis not present

## 2015-03-15 DIAGNOSIS — Z85048 Personal history of other malignant neoplasm of rectum, rectosigmoid junction, and anus: Secondary | ICD-10-CM | POA: Insufficient documentation

## 2015-03-15 DIAGNOSIS — C7951 Secondary malignant neoplasm of bone: Secondary | ICD-10-CM | POA: Insufficient documentation

## 2015-03-15 DIAGNOSIS — I2510 Atherosclerotic heart disease of native coronary artery without angina pectoris: Secondary | ICD-10-CM | POA: Insufficient documentation

## 2015-03-15 DIAGNOSIS — Z79899 Other long term (current) drug therapy: Secondary | ICD-10-CM | POA: Insufficient documentation

## 2015-03-15 DIAGNOSIS — E785 Hyperlipidemia, unspecified: Secondary | ICD-10-CM | POA: Diagnosis not present

## 2015-03-15 DIAGNOSIS — Z882 Allergy status to sulfonamides status: Secondary | ICD-10-CM | POA: Insufficient documentation

## 2015-03-15 DIAGNOSIS — Z7982 Long term (current) use of aspirin: Secondary | ICD-10-CM | POA: Diagnosis not present

## 2015-03-15 DIAGNOSIS — F419 Anxiety disorder, unspecified: Secondary | ICD-10-CM | POA: Insufficient documentation

## 2015-03-15 DIAGNOSIS — E119 Type 2 diabetes mellitus without complications: Secondary | ICD-10-CM | POA: Diagnosis not present

## 2015-03-15 DIAGNOSIS — I10 Essential (primary) hypertension: Secondary | ICD-10-CM | POA: Insufficient documentation

## 2015-03-15 DIAGNOSIS — M899 Disorder of bone, unspecified: Secondary | ICD-10-CM | POA: Diagnosis present

## 2015-03-15 LAB — CBC
HEMATOCRIT: 38.6 % — AB (ref 39.0–52.0)
HEMOGLOBIN: 12.8 g/dL — AB (ref 13.0–17.0)
MCH: 30 pg (ref 26.0–34.0)
MCHC: 33.2 g/dL (ref 30.0–36.0)
MCV: 90.4 fL (ref 78.0–100.0)
Platelets: 226 10*3/uL (ref 150–400)
RBC: 4.27 MIL/uL (ref 4.22–5.81)
RDW: 13.7 % (ref 11.5–15.5)
WBC: 5.9 10*3/uL (ref 4.0–10.5)

## 2015-03-15 LAB — PROTIME-INR
INR: 1.06 (ref 0.00–1.49)
PROTHROMBIN TIME: 14 s (ref 11.6–15.2)

## 2015-03-15 LAB — GLUCOSE, CAPILLARY: GLUCOSE-CAPILLARY: 153 mg/dL — AB (ref 65–99)

## 2015-03-15 LAB — APTT: APTT: 25 s (ref 24–37)

## 2015-03-15 MED ORDER — FENTANYL CITRATE (PF) 100 MCG/2ML IJ SOLN
INTRAMUSCULAR | Status: AC | PRN
  Administered 2015-03-15: 50 ug via INTRAVENOUS
  Administered 2015-03-15 (×2): 25 ug via INTRAVENOUS

## 2015-03-15 MED ORDER — MIDAZOLAM HCL 2 MG/2ML IJ SOLN
INTRAMUSCULAR | Status: AC | PRN
  Administered 2015-03-15: 2 mg via INTRAVENOUS
  Administered 2015-03-15 (×4): 1 mg via INTRAVENOUS

## 2015-03-15 MED ORDER — FENTANYL CITRATE (PF) 100 MCG/2ML IJ SOLN
INTRAMUSCULAR | Status: AC
  Filled 2015-03-15: qty 4

## 2015-03-15 MED ORDER — MIDAZOLAM HCL 2 MG/2ML IJ SOLN
INTRAMUSCULAR | Status: AC
  Filled 2015-03-15: qty 6

## 2015-03-15 MED ORDER — SODIUM CHLORIDE 0.9 % IV SOLN
INTRAVENOUS | Status: DC
  Administered 2015-03-15: 12:00:00 via INTRAVENOUS

## 2015-03-15 NOTE — Discharge Instructions (Signed)
Needle Biopsy °Care After °These instructions give you information on caring for yourself after your procedure. Your doctor may also give you more specific instructions. Call your doctor if you have any problems or questions after your procedure. °HOME CARE °· Rest for 4 hours after your biopsy, except for getting up to go to the bathroom or as told. °· Keep the places where the needles were put in clean and dry. °· Do not put powder or lotion on the sites. °· Do not shower until 24 hours after the test. Remove all bandages (dressings) before showering. °· Remove all bandages at least once every day. Gently clean the sites with soap and water. Keep putting a new bandage on until the skin is closed. °Finding out the results of your test °Ask your doctor when your test results will be ready. Make sure you follow up and get the test results. °GET HELP RIGHT AWAY IF:  °· You have shortness of breath or trouble breathing. °· You have pain or cramping in your belly (abdomen). °· You feel sick to your stomach (nauseous) or throw up (vomit). °· Any of the places where the needles were put in: °¨ Are puffy (swollen) or red. °¨ Are sore or hot to the touch. °¨ Are draining yellowish-white fluid (pus). °¨ Are bleeding after 10 minutes of pressing down on the site. Have someone keep pressing on any place that is bleeding until you see a doctor. °· You have any unusual pain that will not stop. °· You have a fever. °If you go to the emergency room, tell the nurse that you had a biopsy. Take this paper with you to show the nurse. °MAKE SURE YOU:  °· Understand these instructions. °· Will watch your condition. °· Will get help right away if you are not doing well or get worse. °Document Released: 05/24/2008 Document Revised: 09/03/2011 Document Reviewed: 05/24/2008 °ExitCare® Patient Information ©2015 [employer]. This information is not intended to replace advice given to you by your health care provider. Make sure you discuss  any questions you have with your health care provider. °Conscious Sedation °Sedation is the use of medicines to promote relaxation and relieve discomfort and anxiety. Conscious sedation is a type of sedation. Under conscious sedation you are less alert than normal but are still able to respond to instructions or stimulation. Conscious sedation is used during short medical and dental procedures. It is milder than deep sedation or general anesthesia and allows you to return to your regular activities sooner.  °LET YOUR HEALTH CARE PROVIDER KNOW ABOUT:  °· Any allergies you have. °· All medicines you are taking, including vitamins, herbs, eye drops, creams, and over-the-counter medicines. °· Use of steroids (by mouth or creams). °· Previous problems you or members of your family have had with the use of anesthetics. °· Any blood disorders you have. °· Previous surgeries you have had. °· Medical conditions you have. °· Possibility of pregnancy, if this applies. °· Use of cigarettes, alcohol, or illegal drugs. °RISKS AND COMPLICATIONS °Generally, this is a safe procedure. However, as with any procedure, problems can occur. Possible problems include: °· Oversedation. °· Trouble breathing on your own. You may need to have a breathing tube until you are awake and breathing on your own. °· Allergic reaction to any of the medicines used for the procedure. °BEFORE THE PROCEDURE °· You may have blood tests done. These tests can help show how well your kidneys and liver are working. They can also   show how well your blood clots. °· A physical exam will be done.   °· Only take medicines as directed by your health care provider. You may need to stop taking medicines (such as blood thinners, aspirin, or nonsteroidal anti-inflammatory drugs) before the procedure.   °· Do not eat or drink at least 6 hours before the procedure or as directed by your health care provider. °· Arrange for a responsible adult, family member, or friend to  take you home after the procedure. He or she should stay with you for at least 24 hours after the procedure, until the medicine has worn off. °PROCEDURE  °· An intravenous (IV) catheter will be inserted into one of your veins. Medicine will be able to flow directly into your body through this catheter. You may be given medicine through this tube to help prevent pain and help you relax. °· The medical or dental procedure will be done. °AFTER THE PROCEDURE °· You will stay in a recovery area until the medicine has worn off. Your blood pressure and pulse will be checked.   °·  Depending on the procedure you had, you may be allowed to go home when you can tolerate liquids and your pain is under control. °Document Released: 03/06/2001 Document Revised: 06/16/2013 Document Reviewed: 02/16/2013 °ExitCare® Patient Information ©2015 [employer]. This information is not intended to replace advice given to you by your health care provider. Make sure you discuss any questions you have with your health care provider. ° °Conscious Sedation, Adult, Care After °Refer to this sheet in the next few weeks. These instructions provide you with information on caring for yourself after your procedure. Your health care provider may also give you more specific instructions. Your treatment has been planned according to current medical practices, but problems sometimes occur. Call your health care provider if you have any problems or questions after your procedure. °WHAT TO EXPECT AFTER THE PROCEDURE  °After your procedure: °· You may feel sleepy, clumsy, and have poor balance for several hours. °· Vomiting may occur if you eat too soon after the procedure. °HOME CARE INSTRUCTIONS °· Do not participate in any activities where you could become injured for at least 24 hours. Do not: °¨ Drive. °¨ Swim. °¨ Ride a bicycle. °¨ Operate heavy machinery. °¨ Cook. °¨ Use power tools. °¨ Climb ladders. °¨ Work from a high place. °· Do not make  important decisions or sign legal documents until you are improved. °· If you vomit, drink water, juice, or soup when you can drink without vomiting. Make sure you have Min[place]ares or no nausea before eating solid foods. °· Only take over-the-counter or prescription medicines for pain, discomfort, or fever as directed by your health care provider. °· Make sure you and your family fully understand everything about the medicines given to you, including what side effects may occur. °· You should not drink alcohol, take sleeping pills, or take medicines that cause drowsiness for at least 24 hours. °· If you smoke, do not smoke without supervision. °· If you are feeling better, you may resume normal activities 24 hours after you were sedated. °· Keep all appointments with your health care provider. °SEEK MEDICAL CARE IF: °· Your skin is pale or bluish in color. °· You continue to feel nauseous or vomit. °· Your pain is getting worse and is not helped by medicine. °· You have bleeding or swelling. °· You are still sleepy or feeling clumsy after 24 hours. °SEEK IMMEDIATE MEDICAL CARE IF: °· You develop   a rash. °· You have difficulty breathing. °· You develop any type of allergic problem. °· You have a fever. °MAKE SURE YOU: °· Understand these instructions. °· Will watch your condition. °· Will get help right away if you are not doing well or get worse. °Document Released: 04/01/2013 Document Reviewed: 04/01/2013 °ExitCare® Patient Information ©2015 [employer]. This information is not intended to replace advice given to you by your health care provider. Make sure you discuss any questions you have with your health care provider. ° ° °

## 2015-03-15 NOTE — Procedures (Signed)
Technically successful CT guided biopsy of lytic lesion involving the caudal aspect of the left side of the sacrum.  No immediate post procedural complications.   Ronny Bacon, MD Pager #: [phone]

## 2015-03-15 NOTE — H&P (Signed)
Chief Complaint: Patient was seen in consultation today for sacral mass biopsy at the request of Feng,Yan  Referring Physician(s): Feng,Yan  History of Present Illness: Julian Palmer is a 54 y.o. male  Hx colorectal cancer 2012 [facility] 10/2014 did show liver lesions to be followed Re check PET 02/24/2015 revealed: IMPRESSION: 1. Dominant finding is new hypermetabolic skeletal metastasis in the RIGHT sacrum. 2. Small hypermetabolic retroperitoneal periaortic and periportal lymph nodes consistent with nodal metastasis to retroperitoneum. 3. No abnormal metabolic activity within the liver to suggest metastasis. Lesions on comparison MRI were very small which could limit PET accuracy.  Pt does say that he does note some Rt sided hip pain Now scheduled for Rt sacral mass biopsy  Past Medical History  Diagnosis Date  . S/p bare metal coronary artery stent     1998  pLAD  . Hypertension   . 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 Hyperlipidemia   . History of carcinoma in situ of anal canal     2010  . ED (erectile dysfunction)   . Sigmoid diverticulosis   . PONV (postoperative nausea and vomiting)   . Wears glasses   . At risk for sleep apnea     STOP-BANG= 4     SENT TO PCP 03-23-2014  . Coronary artery disease CARDIOLOGIST--  DR Rockey Situ --  LAST LOV 04-17-2013    S/P  PCI to mid and distal LAD and BM stent x1 to pLAD  . Anxiety   . Depression   . HIV positive   . Recurrent squamous cell carcinoma of anus     removal mass 01-08-2014  . 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 Type 2 diabetes mellitus     Past Surgical History  Procedure Laterality Date  . Septoplasty  1985  . Sphincterotomy  2009  . Cardiovascular stress test  04/ 2010    low risk study/  very small region of mildly reduced  perfersion is mildly reversible dLAD territory  . Resection anal mass  02-05-2011  . Excision anal polyp  2009  . Re-excision anal squamous cell carcinoma  2010  . Removal rectal mass  01-08-2014  . Eua w/ bx perianal skin  06-08-2009  . Port-a-cath placement  02-21-2011    REMOVED  2013  . Coronary angioplasty with stent placement  1998   [facility]    BM stent to pLAD and PCI to Mid and Distal LAD/  no other significant cad  . Inguinal hernia repair Bilateral 1988  . Colonoscopy with propofol  05-28-2008  . Mass excision N/A 03/26/2014    Procedure: ANAL EXAM UNDER ANESTHESIA,EXCISIONAL BIOPSY OF ANAL MASS;  Surgeon: Leighton Ruff, MD;  Location: [facility];  Service: General;  Laterality: N/A;  . Laparoscopic assisted abdominal perineal resection N/A 04/22/2014    Procedure: LAPAROSCOPIC  ABDOMINAL PERINEAL RESECTION, ;  Surgeon: Leighton Ruff, MD;  Location: [facility];  Service: General;  Laterality: N/A;  . Urethrotomy Bilateral 04/22/2014    Procedure: CYSTOSCOPY/URETHROTOMY;  Surgeon: Leighton Ruff, MD;  Location: [facility];  Service: General;  Laterality: Bilateral;  . Muscle flap closure N/A 04/22/2014    Procedure: VRAM FLAP;  Surgeon: Irene Limbo, MD;  Location: [facility];  Service: Plastics;  Laterality: N/A;  . Portacath placement  Left 06/24/2014    Procedure: INSERTION PORT-A-CATH LEFT SUBCLAVIAN;  Surgeon: Leighton Ruff, MD;  Location: [facility];  Service: General;  Laterality: Left;    Allergies: Sulfa antibiotics  Medications: Prior to Admission medications   Medication Sig Start Date End Date Taking? Authorizing Provider  aspirin (ASPIR-81) 81 MG EC tablet Take 81 mg by mouth at bedtime.    Yes Historical Provider, MD  atorvastatin (LIPITOR) 80 MG tablet Take 1 tablet (80 mg total) by mouth every evening. 12/01/14  Yes Minna Merritts, MD  citalopram (CELEXA) 40 MG tablet Take 40 mg by mouth every morning.   Yes Historical Provider, MD    efavirenz-emtrictabine-tenofovir (ATRIPLA) 600-200-300 MG per tablet Take 1 tablet by mouth at bedtime.    Yes Historical Provider, MD  Empagliflozin-Linagliptin (GLYXAMBI) 25-5 MG TABS Take 1 tablet by mouth daily. 12/16/14  Yes Minna Merritts, MD  glimepiride (AMARYL) 2 MG tablet Take 4 mg by mouth daily with breakfast.    Yes Historical Provider, MD  losartan (COZAAR) 100 MG tablet TAKE 1 TABLET EVERY DAY Patient taking differently: TAKE 1 TABLET EVERY DAY at bedtime 09/29/14  Yes Minna Merritts, MD  metFORMIN (GLUCOPHAGE) 1000 MG tablet Take 1,000 mg by mouth 2 (two) times daily.     Yes Historical Provider, MD  Multiple Vitamins-Minerals (MULTIVITAL) tablet Take 1 tablet by mouth every evening.    Yes Historical Provider, MD  niacin (NIASPAN) 1000 MG CR tablet Take 1 tablet (1,000 mg total) by mouth at bedtime. 01/06/15  Yes Minna Merritts, MD  [place]    Historical Provider, MD     Family History  Problem Relation Age of Onset  . Diabetes type II Mother   . Breast cancer Mother     survivor  . Hypertension Mother   . Coronary artery disease Father   . Uterine cancer Paternal Grandmother     Social History   Social History  . Marital Status: Single    Spouse Name: N/A  . Number of Children: 0  . Years of Education: N/A   Occupational History  .  [employer] History Main Topics  . Smoking status: Never Smoker   . Smokeless tobacco: Never Used  . Alcohol Use: No     Comment: RARE  . Drug Use: No  . Sexual Activity: Not Asked   Other Topics Concern  . None   Social History Narrative   Full time. Single. Does not regularly exercise.     Review of Systems: A 12 point ROS discussed and pertinent positives are indicated in the HPI above.  All other systems are negative.  Review of Systems  Constitutional: Negative for activity change, appetite change and unexpected weight change.  Respiratory: Negative for shortness of  breath.   Gastrointestinal: Negative for nausea and abdominal pain.  Musculoskeletal: Negative for back pain.       Rt hip achiness  Neurological: Negative for weakness.  Psychiatric/Behavioral: Negative for behavioral problems and confusion.    Vital Signs: BP 148/84 mmHg  Pulse 97  Temp(Src) 98.5 F (36.9 C) (Oral)  Resp 18  Ht 5\' 10"  (1.778 m)  Wt 206 lb (93.441 kg)  BMI 29.56 kg/m2  SpO2 100%  Physical Exam  Constitutional: He is oriented to person, place, and time. He appears well-nourished.  Cardiovascular: Normal rate, regular rhythm and normal heart sounds.   Pulmonary/Chest: Effort normal and breath sounds normal.  Abdominal: Soft.  Musculoskeletal: Normal  range of motion.  Neurological: He is alert and oriented to person, place, and time.  Skin: Skin is warm and dry.  Psychiatric: He has a normal mood and affect. His behavior is normal. Judgment and thought content normal.  Nursing note and vitals reviewed.   Mallampati Score:  MD Evaluation Airway: WNL Heart: WNL Abdomen: WNL Chest/ Lungs: WNL ASA  Classification: 3 Mallampati/Airway Score: One  Imaging: Nm Pet Image Restage (ps) Whole Body  02/24/2015   CLINICAL DATA:  Subsequent treatment strategy for colorectal carcinoma. Chemotherapy ongoing. Radiation therapy 2012. Indeterminate lesions on MRI 11/02/2014  EXAM: NUCLEAR MEDICINE PET skullbase to thigh  TECHNIQUE: 13.1 mCi F-18 FDG was injected intravenously. Full-ring PET imaging was performed from the skullbase to the thigh radiotracer. CT data was obtained and used for attenuation correction and anatomic localization.  FASTING BLOOD GLUCOSE:  Value:  173 mg/dl  COMPARISON:  MRI 01/27/2009 2016, CT 10/11/2014, PET-CT 01/19/2014  FINDINGS: Head/Neck: No hypermetabolic lymph nodes in the neck.  Chest: No hypermetabolic mediastinal or hilar nodes. No suspicious pulmonary nodules on the CT scan.  Abdomen/Pelvis: No abnormal metabolic activity within the liver to  correspond to the small lesions on comparison MRI.  There are several round irregular periportal lymph nodes which are minimally enlarged. For example 12 mm lymph node on image 163 series 3 has mild metabolic activity. Additionally there is a LEFT periaortic lymph node on image 197, series 3 measuring 11 mm also with metabolic activity (SUV max equal 4.5).  There is a LEFT lower quadrant colostomy with physiologic activity.  Skeleton: There is a new lytic lesion within the RIGHT sacrum with soft tissue expansion measuring approximately 3.7 x 4.8 cm (image 246, series 3) with SUV max equal 8.8. No additional skeletal metastasis.  IMPRESSION: 1. Dominant finding is new hypermetabolic skeletal metastasis in the RIGHT sacrum. 2. Small hypermetabolic retroperitoneal periaortic and periportal lymph nodes consistent with nodal metastasis to retroperitoneum. 3. No abnormal metabolic activity within the liver to suggest metastasis. Lesions on comparison MRI were very small which could limit PET accuracy.   Electronically Signed   By: Suzy Bouchard M.D.   On: 02/24/2015 10:05    Labs:  CBC:  Recent Labs  10/11/14 1154 11/10/14 0809 02/18/15 0812 03/15/15 1130  WBC 8.7 4.8 5.1 5.9  HGB 10.2* 11.4* 12.4* 12.8*  HCT 30.4* 33.1* 36.8* 38.6*  PLT 252 229 213 226    COAGS:  Recent Labs  03/15/15 1130  INR 1.06  APTT 25    BMP:  Recent Labs  04/16/14 0830  04/23/14 0545 04/24/14 0420 04/25/14 0505  09/20/14 0828 10/11/14 1154 11/10/14 0809 02/18/15 0812  NA 135*  < > 136* 133* 138  < > 136 137 138 140  K 4.4  < > 4.5 3.8 3.9  < > 4.4 4.2 4.1 4.5  CL 96  --  100 98 102  --   --   --   --   --   CO2 25  --  22 26 25   < > 21* 19* 23 25  GLUCOSE 177*  --  299* 239* 250*  < > 325* 149* 113 130  BUN 26*  --  13 14 12   < > 24.2 24.5 20.9 25.9  CALCIUM 9.8  --  8.0* 8.0* 7.9*  < > 9.4 9.3 9.5 9.8  CREATININE 0.93  --  0.89 0.86 0.74  < > 1.3 1.1 1.1 1.3  GFRNONAA >90  --  >90 >90 >  90  --    --   --   --   --   GFRAA >90  --  >90 >90 >90  --   --   --   --   --   < > = values in this interval not displayed.  LIVER FUNCTION TESTS:  Recent Labs  09/20/14 0828 10/11/14 1154 11/10/14 0809 02/18/15 0812  BILITOT 0.25 <0.20 0.24 0.21  AST 16 16 22 25   ALT 23 19 22 21   ALKPHOS 104 125 92 85  PROT 8.4* 7.7 7.9 8.1  ALBUMIN 4.1 3.9 3.8 4.0    TUMOR MARKERS: No results for input(s): AFPTM, CEA, CA199, CHROMGRNA in the last 8760 hours.  Assessment and Plan:  Hx colorectal ca 2012 MRI 10/2014 did reveal some liver lesions Follow up PET revealed R sacral lesion and LAN; no uptake in liver Now scheduled for sacral lesion biopsy Risks and Benefits discussed with the patient including, but not limited to bleeding, infection, damage to adjacent structures or low yield requiring additional tests. All of the patient's questions were answered, patient is agreeable to proceed. Consent signed and in chart.   Thank you for this interesting consult.  I greatly enjoyed meeting Julian Palmer and look forward to participating in their care.  A copy of this report was sent to the requesting provider on this date.  Signed: TURPIN,PAMELA A 03/15/2015, 12:51 PM   I spent a total of  30 Minutes   in face to face in clinical consultation, greater than 50% of which was counseling/coordinating care for R sacral lesion bx

## 2015-03-22 ENCOUNTER — Telehealth: Payer: Self-pay | Admitting: Hematology

## 2015-03-22 ENCOUNTER — Encounter: Payer: Self-pay | Admitting: Hematology

## 2015-03-22 ENCOUNTER — Ambulatory Visit (HOSPITAL_BASED_OUTPATIENT_CLINIC_OR_DEPARTMENT_OTHER): Payer: Managed Care, Other (non HMO) | Admitting: Hematology

## 2015-03-22 VITALS — BP 149/79 | HR 86 | Temp 98.2°F | Resp 18 | Ht 70.0 in | Wt 214.5 lb

## 2015-03-22 DIAGNOSIS — Z23 Encounter for immunization: Secondary | ICD-10-CM

## 2015-03-22 DIAGNOSIS — C211 Malignant neoplasm of anal canal: Secondary | ICD-10-CM | POA: Diagnosis not present

## 2015-03-22 MED ORDER — INFLUENZA VAC SPLIT QUAD 0.5 ML IM SUSY
0.5000 mL | PREFILLED_SYRINGE | Freq: Once | INTRAMUSCULAR | Status: AC
  Administered 2015-03-22: 0.5 mL via INTRAMUSCULAR
  Filled 2015-03-22: qty 0.5

## 2015-03-22 MED ORDER — TRAMADOL HCL 50 MG PO TABS: 50.0000 mg | ORAL_TABLET | Freq: Four times a day (QID) | ORAL | Status: DC | PRN

## 2015-03-22 NOTE — Telephone Encounter (Signed)
Gave and printed appt sched and avs fo rpt for OCT and NOV  °

## 2015-03-22 NOTE — Progress Notes (Signed)
[facility] NOTE  Patient Care Team: [name], MD as PCP - General (Infectious Diseases) Robert Bellow, MD (General Surgery) Leighton Ruff, MD as Consulting Physician (General Surgery) Irene Limbo, MD as Consulting Physician (Plastic Surgery) Truitt Merle, MD as Consulting Physician (Hematology) Festus Aloe, MD as Consulting Physician (Urology)  DIAGNOSIS Recurrent anal cancer    Primary cancer of anal canal   02/05/2011 Initial Diagnosis Primary cancer of anal canal, T1N0M0, s/p surgical resection with positive margines. He received definitive dose RT 45 Gy over 5 weeks with 16Gy boosting and concurrent chemo with 5FU, treated at [facility] by Drs.  Geoffreyh and [employer].    11/23/2013 Relapse/Recurrence local recurrence    04/21/2014 Surgery abdominal perianal resection (APR) and VRAM flap closure of perineal wound on 04/21/14, surgical margins were positive for cancer    06/28/2014 - 09/25/2014 Chemotherapy 5-FU 1000mg /m2/day on D1-5, ciplatin 100mg /m2 on D2, every 28 days, s/p 4 cycles    10/11/2014 Progression CT CAP showed new liver lesions and abd/pelc adenopathy, highly suspecious for progressive metastatic disease    11/02/2014 Imaging abdomen MRI w wo contrast showed new small lesions in the right hepatic lobe with nonspecific enhancement, and enlarging nodes in the porta hepatis, metastatic disease not excluded      OTHER RELATED ISSUES: 1. (+) HIV, on therapy with good control  2. CAD, s/p stent placement in [place]: Observation   INTERIM HISTORY: Julian Palmer returns for follow up. He underwent sacral on lesion biopsy, and tolerated the biopsy well. He also went to [facility] and saw surgical oncologist Dr. Cloyd Stagers on 9/22. He did not feel the visit was very informative. He has noticed some mild sacral pain, especially he sits. He has tried Tylenol and ibuprofen, seems not enough. He otherwise feels well, has good energy  level and appetite, no weight loss or other symptoms.   MEDICAL HISTORY:  Past Medical History  Diagnosis Date  . S/p bare metal coronary artery stent     1998  pLAD  . Hypertension   . 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 Hyperlipidemia   . History of carcinoma in situ of anal canal     2010  . ED (erectile dysfunction)   . Sigmoid diverticulosis   . PONV (postoperative nausea and vomiting)   . Wears glasses   . At risk for sleep apnea     STOP-BANG= 4     SENT TO PCP 03-23-2014  . Coronary artery disease CARDIOLOGIST--  DR Rockey Situ --  LAST LOV 04-17-2013    S/P  PCI to mid and distal LAD and BM stent x1 to pLAD  . Anxiety   . Depression   . HIV positive   . Recurrent squamous cell carcinoma of anus     removal mass 01-08-2014  . 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 Type 2 diabetes mellitus     SURGICAL HISTORY: Past Surgical History  Procedure Laterality Date  . Septoplasty  1985  . Sphincterotomy  2009  . Cardiovascular stress test  04/ 2010    low risk study/  very small region of mildly reduced perfersion is mildly reversible dLAD territory  . Resection anal mass  02-05-2011  . Excision anal polyp  2009  . Re-excision anal squamous cell  carcinoma  2010  . Removal rectal mass  01-08-2014  . Eua w/ bx perianal skin  06-08-2009  . Port-a-cath placement  02-21-2011    REMOVED  2013  . Coronary angioplasty with stent placement  1998   [facility]    BM stent to pLAD and PCI to Mid and Distal LAD/  no other significant cad  . Inguinal hernia repair Bilateral 1988  . Colonoscopy with propofol  05-28-2008  . Mass excision N/A 03/26/2014    Procedure: ANAL EXAM UNDER ANESTHESIA,EXCISIONAL BIOPSY OF ANAL MASS;  Surgeon: Leighton Ruff, MD;  Location: [facility];  Service: General;  Laterality: N/A;  .  Laparoscopic assisted abdominal perineal resection N/A 04/22/2014    Procedure: LAPAROSCOPIC  ABDOMINAL PERINEAL RESECTION, ;  Surgeon: Leighton Ruff, MD;  Location: [facility];  Service: General;  Laterality: N/A;  . Urethrotomy Bilateral 04/22/2014    Procedure: CYSTOSCOPY/URETHROTOMY;  Surgeon: Leighton Ruff, MD;  Location: [facility];  Service: General;  Laterality: Bilateral;  . Muscle flap closure N/A 04/22/2014    Procedure: VRAM FLAP;  Surgeon: Irene Limbo, MD;  Location: [facility];  Service: Plastics;  Laterality: N/A;  . Portacath placement Left 06/24/2014    Procedure: INSERTION PORT-A-CATH LEFT SUBCLAVIAN;  Surgeon: Leighton Ruff, MD;  Location: [facility];  Service: General;  Laterality: Left;    SOCIAL HISTORY: Social History   Social History  . Marital Status: Single    Spouse Name: N/A  . Number of Children: 0  . Years of Education: N/A   Occupational History  .  [employer] History Main Topics  . Smoking status: Never Smoker   . Smokeless tobacco: Never Used  . Alcohol Use: No     Comment: RARE  . Drug Use: No  . Sexual Activity: Not on file   Other Topics Concern  . Not on file   Social History Narrative   Full time. Single. Does not regularly exercise.     FAMILY HISTORY: Family History  Problem Relation Age of Onset  . Diabetes type II Mother   . Breast cancer Mother 36     survivor  . Hypertension Mother   . Coronary artery disease Father   Paternal GM had utrine cancer, no other history of malignancy   ALLERGIES:  is allergic to sulfa antibiotics.  MEDICATIONS:  Current Outpatient Prescriptions  Medication Sig Dispense Refill  . aspirin (ASPIR-81) 81 MG EC tablet Take 81 mg by mouth at bedtime.     Marland Kitchen atorvastatin (LIPITOR) 80 MG tablet Take 1 tablet (80 mg total) by mouth every evening. 90 tablet 0  . citalopram (CELEXA) 40 MG tablet Take 40 mg by mouth every morning.    Marland Kitchen efavirenz-emtrictabine-tenofovir (ATRIPLA) 600-200-300 MG per  tablet Take 1 tablet by mouth at bedtime.     . Empagliflozin-Linagliptin (GLYXAMBI) 25-5 MG TABS Take 1 tablet by mouth daily.    Marland Kitchen glimepiride (AMARYL) 2 MG tablet Take 4 mg by mouth daily with breakfast.     . losartan (COZAAR) 100 MG tablet TAKE 1 TABLET EVERY DAY (Patient taking differently: TAKE 1 TABLET EVERY DAY at bedtime) 90 tablet 3  . metFORMIN (GLUCOPHAGE) 1000 MG tablet Take 1,000 mg by mouth 2 (two) times daily.      . Multiple Vitamins-Minerals (MULTIVITAL) tablet Take 1 tablet by mouth every evening.     . niacin (NIASPAN) 1000 MG CR tablet Take 1 tablet (1,000 mg total) by mouth at bedtime. 90 tablet 3  .  PRESCRIPTION MEDICATION CHEMO [facility]     No current facility-administered medications for this visit.   Facility-Administered Medications Ordered in Other Visits  Medication Dose Route Frequency Provider Last Rate Last Dose  . heparin lock flush 100 unit/mL  500 Units Intravenous Once Truitt Merle, MD      . sodium chloride 0.9 % injection 10 mL  10 mL Intravenous PRN Truitt Merle, MD        REVIEW OF SYSTEMS:   Constitutional: Denies fevers, chills or abnormal night sweats Eyes: Denies blurriness of vision, double vision or watery eyes Ears, nose, mouth, throat, and face: Denies mucositis or sore throat Respiratory: Denies cough, dyspnea or wheezes Cardiovascular: Denies palpitation, chest discomfort or lower extremity swelling Gastrointestinal:  Denies nausea, heartburn or change in bowel habits, (+) colostomy bag Skin: Mild skin pigmentation from his previous rash. Lymphatics: Denies new lymphadenopathy or easy bruising Neurological:Denies numbness, tingling or new weaknesses Behavioral/Psych: Mood is stable, no new changes  All other systems were reviewed with the patient and are negative.  PHYSICAL EXAMINATION: ECOG PERFORMANCE STATUS: 0  Filed Vitals:   03/22/15 1217  BP: 149/79  Pulse: 86  Temp: 98.2 F (36.8 C)  Resp: 18   Filed Weights   03/22/15 1217   Weight: 214 lb 8 oz (97.297 kg)    GENERAL:alert, no distress and comfortable SKIN: skin color, texture, turgor are normal, healed rashes on her arms, legs, and trunk with skin pigmentation.  EYES: normal, conjunctiva are pink and non-injected, sclera clear OROPHARYNX:no exudate, no erythema and lips, buccal mucosa, and tongue normal  NECK: supple, thyroid normal size, non-tender, without nodularity LYMPH:  no palpable lymphadenopathy in the cervical, axillary or inguinal LUNGS: clear to auscultation and percussion with normal breathing effort HEART: regular rate & rhythm and no murmurs and no lower extremity edema ABDOMEN:abdomen soft, non-tender and normal bowel sounds. (+) Colostomy bag. The perianal reconstruction site is completely healed.  Musculoskeletal:no cyanosis of digits and no clubbing  PSYCH: alert & oriented x 3 with fluent speech NEURO: no focal motor/sensory deficits SKIN: A few resolving skin pigmentation from his previous rash  LABORATORY DATA:  I have reviewed the data as listed Lab Results  Component Value Date   WBC 5.9 03/15/2015   HGB 12.8* 03/15/2015   HCT 38.6* 03/15/2015   MCV 90.4 03/15/2015   PLT 226 03/15/2015    Recent Labs  04/23/14 0545 04/24/14 0420 04/25/14 0505  10/11/14 1154 11/10/14 0809 02/18/15 0812  NA 136* 133* 138  < > 137 138 140  K 4.5 3.8 3.9  < > 4.2 4.1 4.5  CL 100 98 102  --   --   --   --   CO2 22 26 25   < > 19* 23 25  GLUCOSE 299* 239* 250*  < > 149* 113 130  BUN 13 14 12   < > 24.5 20.9 25.9  CREATININE 0.89 0.86 0.74  < > 1.1 1.1 1.3  CALCIUM 8.0* 8.0* 7.9*  < > 9.3 9.5 9.8  GFRNONAA >90 >90 >90  --   --   --   --   GFRAA >90 >90 >90  --   --   --   --   PROT  --   --   --   < > 7.7 7.9 8.1  ALBUMIN  --   --   --   < > 3.9 3.8 4.0  AST  --   --   --   < >  16 22 25   ALT  --   --   --   < > 19 22 21   ALKPHOS  --   --   --   < > 125 92 85  BILITOT  --   --   --   < > <0.20 0.24 0.21  < > = values in this interval  not displayed.   PATHOLOGY REPORT 04/22/2014 Diagnosis 1. Soft tissue, biopsy, right pelvic sidewall; r/o malignancy - FIBROADIPOSE TISSUE, NO EVIDENCE OF MALIGNANCY. 2. Colon, segmental resection for tumor, rectum,sigmoid and anus - RECURRENT INVASIVE POORLY DIFFERENTIATED SQUAMOUS CELL CARCINOMA, INVADING THROUGH THE MUSCULARIS PROPRIA, INTO PERICOLONIC FATTY TISSUE, EXTENDING TO THE DEEP INKED MARGIN. - PERINEURAL INVASION AND ANGIOLYMPHATIC PRESENT. - SEVEN OF SEVENTEEN LYMPH NODES, POSITIVE FOR METASTATIC CARCINOMA (7/17) Microscopic Comment 2. Anus Specimen: Anus, rectum, sigmoid Procedure: Segmental resection Tumor site: Distal ends of specimen Specimen integrity: Intact Invasive tumor: Maximum size: At least 2.5 cm Histologic type(s): Invasive squamous cell carcinoma Histologic grade and differentiation: G2-3, moderately to poorly differentiated Microscopic extension of invasive tumor: Invading through the muscularis propria into pericolonic fatty tissue involving adjacent skeletal muscle tissue Lymph-Vascular invasion: Present Peri-neural invasion: Present Resection margins: Deep margin is positive for tumor Treatment effect (neo-adjuvant therapy): Present, minimally Lymph nodes: number examined 17; number positive: 7 Pathologic Staging: ypT2, yN1 (R1) Ancillary studies: N/A  Diagnosis 03/15/2015 Bone, biopsy, caudal left side of the sacrum - METASTATIC SQUAMOUS CELL CARCINOMA, SEE COMMENT. Microscopic Comment Biopsies demonstrate extensive involvement by metastatic focally keratinizing, well to moderately differentiated squamous cell carcinoma. The previous anorectal resection demonstrating invasive squamous cell carcinoma is noted ([id number]). (CRR:ecj 03/16/2015)  RADIOGRAPHIC STUDIES: I have personally reviewed the radiological images as listed and agreed with the findings in the report.  CT chest/abd/pelvis with contrast 10/11/2014 IMPRESSION: Interval  development of new hypodense ill-defined hepatic lesions which, in the context of increased gastrohepatic, retroperitoneal, and pelvic sidewall lymphadenopathy, are highly suspicious for progressive metastatic disease.  No evidence for intrathoracic metastatic disease or acute abnormality.  Abdomen MRI w wo contrast 11/02/2014  IMPRESSION: 1. 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 2. The new smaller lesions within the right hepatic lobe are confirmed with nonspecific imaging/enhancement characteristics. Given the fact that these are new, and that there are enlarging lymph nodes within the porta hepatis, metastatic disease cannot be excluded. Correlation with tumor markers recommended. If metastatic disease remains uncertain clinically, further evaluation with PET-CT or short-term follow-up MRI could be performed. 3. Cholelithiasis without evidence of biliary dilatation.  Pet 02/24/2015  IMPRESSION: 1. Dominant finding is new hypermetabolic skeletal metastasis in the RIGHT sacrum. 2. Small hypermetabolic retroperitoneal periaortic and periportal lymph nodes consistent with nodal metastasis to retroperitoneum. 3. No abnormal metabolic activity within the liver to suggest metastasis. Lesions on comparison MRI were very small which could limit PET accuracy.  ASSESSMENT & PLAN:  54 years old male with past medical history of anal squamous cell carcinoma, status post surgical resection and concurrent chemoradiation in 2009, now has local recurrence status post APR in the reconstruction.ypT2, yN1 (R1), unfortunately his deep surgical margin was positive, and 7 out of 17 lymph nodes were positive also. No distant metastasis on the preop PET scan. His medical history is also complicated by diabetes, coronary artery disease status post stent placement, and HIV on treatment. Performance status 1.  1. Local recurrence of anal squamous cell carcinoma, with positive surgical  margines, new small liver lesions on scan 10/11/2014, and biopsy proven sacral  bone mets  -His recent PET scan from 02/24/2015 showed hypermetabolic skeletal metastasis in the right sacrum, and a few small hypermetabolic retroperitoneal lymph nodes, liver tissue was non-hypermetabolic, no other distant metastasis. -I discussed his sacral bone biopsy results, unfortunately he confirmed metastasis. -Patient does not want more chemotherapy. I discussed the clinical trial of immunotherapy new follow him at in metastatic and no cancer, the data was present in ASCO 2016. It is small study, a total of 37 patient's, showed partial response rate about 20% and overall disease control rate about 80%. -If his insurance company can approve involvement, I recommend him to try Nivo. Nivo is approved for lung, renal cancer and melanoma, but not for anal cancer yet -Potential side effects from Nivo, especially autoimmune-related hypothyroidism, interstitial pneumonitis, cardiomyopathy, colitis, etc. were discussed with patient, and he agrees to proceed. -I'll tentatively start him in 3 weeks.  2. Diabetes -His blood sugar is not very well controlled, he has recently had on one more oral medication. He agrees to monitor his sugar closely at home and use insulin.   3. HIV  -Well controlled. Continue his treatment . His recent CD4 count was normal.   4. Hypertension and coronary artery disease  -He will continue follow-up with his primary care physician and cardiologist. Continue medications. I'll monitor his blood pressure, blood sugar during his chemotherapy. I discussed that we sometimes use steroids during chemotherapy treatment, which can cause hyperglycemia.   5. Sacral pain -Secondary to his sacral bone metastasis -I given a prescription of tramadol today.  6. depression -He denies suicidal. He is still quite depressed and frustrated about the side effects from chemotherapy, and colostomy bag. -His mood is  quite good today. He is looking forward to making a trip to [place] in July for a country music concert.  Plan -Start Nivo in 3 weeks if his insurance approve it -I will see him back in 3 weeks, lab CBC, CMP and TSH    All questions were answered. The patient knows to call the clinic with any problems, questions or concerns.   I spent 25 minutes counseling the patient face to face. The total time spent in the appointment was 30 minutes and more than 50% was on counseling.     Truitt Merle, MD 03/22/2015 8:10 AM

## 2015-03-23 ENCOUNTER — Telehealth: Payer: Self-pay | Admitting: *Deleted

## 2015-03-23 NOTE — Telephone Encounter (Signed)
Received note from after hours call center that patient's pharmacy did not get tramadol.  Called patient, he took script that Dr. Burr Medico gave him and pharmacy filled tramadol.

## 2015-03-24 DIAGNOSIS — Z85048 Personal history of other malignant neoplasm of rectum, rectosigmoid junction, and anus: Secondary | ICD-10-CM | POA: Insufficient documentation

## 2015-03-24 NOTE — Addendum Note (Signed)
Addended by: Truitt Merle on: 03/24/2015 05:54 PM   Modules accepted: Orders

## 2015-03-30 ENCOUNTER — Encounter: Payer: Self-pay | Admitting: Hematology

## 2015-03-30 NOTE — Progress Notes (Signed)
I placed app with [employer] for opdivo on desk of nurse.

## 2015-04-04 ENCOUNTER — Encounter: Payer: Self-pay | Admitting: Hematology

## 2015-04-04 NOTE — Progress Notes (Signed)
Called Julian Palmer to discuss Opdivo assistance with [employer]. Patient agreed to have the Patient Authorization and Agreement form e-mail. Advised patient to sign and  e-mail the form back along with proof of income. Patient agreed.

## 2015-04-05 ENCOUNTER — Encounter: Payer: Self-pay | Admitting: Hematology

## 2015-04-05 NOTE — Progress Notes (Signed)
Faxed completed application to [employer] for assistance with Opdivo, due to off label drug. Sent message to billing to advise pt. Has enrolled in the program and will follow-up once the pt. Has been approve with further instruction.

## 2015-04-15 ENCOUNTER — Other Ambulatory Visit (HOSPITAL_BASED_OUTPATIENT_CLINIC_OR_DEPARTMENT_OTHER): Payer: Managed Care, Other (non HMO)

## 2015-04-15 ENCOUNTER — Ambulatory Visit (HOSPITAL_BASED_OUTPATIENT_CLINIC_OR_DEPARTMENT_OTHER): Payer: Managed Care, Other (non HMO) | Admitting: Nurse Practitioner

## 2015-04-15 ENCOUNTER — Ambulatory Visit (HOSPITAL_BASED_OUTPATIENT_CLINIC_OR_DEPARTMENT_OTHER): Payer: Managed Care, Other (non HMO)

## 2015-04-15 VITALS — BP 131/74 | HR 86 | Temp 98.5°F | Resp 18 | Ht 70.0 in | Wt 208.3 lb

## 2015-04-15 DIAGNOSIS — C211 Malignant neoplasm of anal canal: Secondary | ICD-10-CM

## 2015-04-15 DIAGNOSIS — C7951 Secondary malignant neoplasm of bone: Secondary | ICD-10-CM | POA: Diagnosis not present

## 2015-04-15 DIAGNOSIS — Z5112 Encounter for antineoplastic immunotherapy: Secondary | ICD-10-CM

## 2015-04-15 DIAGNOSIS — Z85048 Personal history of other malignant neoplasm of rectum, rectosigmoid junction, and anus: Secondary | ICD-10-CM

## 2015-04-15 DIAGNOSIS — K769 Liver disease, unspecified: Secondary | ICD-10-CM

## 2015-04-15 DIAGNOSIS — Z79899 Other long term (current) drug therapy: Secondary | ICD-10-CM | POA: Diagnosis not present

## 2015-04-15 LAB — COMPREHENSIVE METABOLIC PANEL (CC13)
ALBUMIN: 3.9 g/dL (ref 3.5–5.0)
ALT: 38 U/L (ref 0–55)
AST: 44 U/L — AB (ref 5–34)
Alkaline Phosphatase: 125 U/L (ref 40–150)
Anion Gap: 10 mEq/L (ref 3–11)
BUN: 24.5 mg/dL (ref 7.0–26.0)
CALCIUM: 10.1 mg/dL (ref 8.4–10.4)
CHLORIDE: 105 meq/L (ref 98–109)
CO2: 25 mEq/L (ref 22–29)
CREATININE: 1.3 mg/dL (ref 0.7–1.3)
EGFR: 64 mL/min/{1.73_m2} — ABNORMAL LOW (ref >90)
Glucose: 87 mg/dl (ref 70–140)
Potassium: 4.4 mEq/L (ref 3.5–5.1)
Sodium: 140 mEq/L (ref 136–145)
Total Bilirubin: <0.3 mg/dL (ref 0.20–1.20)
Total Protein: 8.6 g/dL — ABNORMAL HIGH (ref 6.4–8.3)

## 2015-04-15 LAB — CBC WITH DIFFERENTIAL/PLATELET
BASO%: 0.8 % (ref 0.0–2.0)
Basophils Absolute: 0 10*3/uL (ref 0.0–0.1)
EOS%: 3.1 % (ref 0.0–7.0)
Eosinophils Absolute: 0.2 10*3/uL (ref 0.0–0.5)
HEMATOCRIT: 38.5 % (ref 38.4–49.9)
HEMOGLOBIN: 13.1 g/dL (ref 13.0–17.1)
LYMPH#: 1.7 10*3/uL (ref 0.9–3.3)
LYMPH%: 29.3 % (ref 14.0–49.0)
MCH: 30.5 pg (ref 27.2–33.4)
MCHC: 34 g/dL (ref 32.0–36.0)
MCV: 89.5 fL (ref 79.3–98.0)
MONO#: 0.8 10*3/uL (ref 0.1–0.9)
MONO%: 14.1 % — ABNORMAL HIGH (ref 0.0–14.0)
NEUT#: 3 10*3/uL (ref 1.5–6.5)
NEUT%: 52.7 % (ref 39.0–75.0)
Platelets: 262 10*3/uL (ref 140–400)
RBC: 4.3 10*6/uL (ref 4.20–5.82)
RDW: 14.5 % (ref 11.0–14.6)
WBC: 5.7 10*3/uL (ref 4.0–10.3)

## 2015-04-15 LAB — TSH CHCC: TSH: 1.339 m[IU]/L (ref 0.320–4.118)

## 2015-04-15 MED ORDER — HEPARIN SOD (PORK) LOCK FLUSH 100 UNIT/ML IV SOLN
500.0000 [IU] | Freq: Once | INTRAVENOUS | Status: AC | PRN
  Administered 2015-04-15: 500 [IU]
  Filled 2015-04-15: qty 5

## 2015-04-15 MED ORDER — SODIUM CHLORIDE 0.9 % IV SOLN
240.0000 mg | Freq: Once | INTRAVENOUS | Status: AC
  Administered 2015-04-15: 240 mg via INTRAVENOUS
  Filled 2015-04-15: qty 24

## 2015-04-15 MED ORDER — SODIUM CHLORIDE 0.9 % IV SOLN
Freq: Once | INTRAVENOUS | Status: AC
  Administered 2015-04-15: 12:00:00 via INTRAVENOUS

## 2015-04-15 MED ORDER — SODIUM CHLORIDE 0.9 % IJ SOLN
10.0000 mL | INTRAMUSCULAR | Status: DC | PRN
  Administered 2015-04-15: 10 mL
  Filled 2015-04-15: qty 10

## 2015-04-15 NOTE — Progress Notes (Signed)
  [facility] OFFICE PROGRESS NOTE   DIAGNOSIS Recurrent anal cancer    Primary cancer of anal canal   02/05/2011 Initial Diagnosis Primary cancer of anal canal, T1N0M0, s/p surgical resection with positive margines. He received definitive dose RT 45 Gy over 5 weeks with 16Gy boosting and concurrent chemo with 5FU, treated at [facility] by Drs. Geoffreyh and [employer].    11/23/2013 Relapse/Recurrence local recurrence    04/21/2014 Surgery abdominal perianal resection (APR) and VRAM flap closure of perineal wound on 04/21/14, surgical margins were positive for cancer    06/28/2014 - 09/25/2014 Chemotherapy 5-FU 1000mg /m2/day on D1-5, ciplatin 100mg /m2 on D2, every 28 days, s/p 4 cycles    10/11/2014 Progression CT CAP showed new liver lesions and abd/pelc adenopathy, highly suspicious for progressive metastatic disease    11/02/2014 Imaging abdomen MRI w wo contrast showed new small lesions in the right hepatic lobe with nonspecific enhancement, and enlarging nodes in the porta hepatis, metastatic disease not excluded      OTHER RELATED ISSUES: 1. (+) HIV, on therapy with good control  2. CAD, s/p stent placement in [place]:  Nivolumab every 2 weeks 1st cycle 04/15/2015        INTERVAL HISTORY:   Julian Palmer returns as scheduled. He continues to have pain at the sacrum. Tramadol does not relieve the pain. He describes the pain as "nagging and constant". He has Percocet at home and plans to try this. He denies nausea/vomiting. No diarrhea. No rash. He continues to have a good appetite.  Objective:  Vital signs in last 24 hours:  Blood pressure 131/74, pulse 86, temperature 98.5 F (36.9 C), temperature source Oral, resp. rate 18, height 5\' 10"  (1.778 m), weight 208 lb 4.8 oz (94.484 kg), SpO2 99 %.    HEENT: No thrush or ulcers. Resp: Lungs clear bilaterally. Cardio: Regular rate and rhythm. GI: Abdomen soft and  nontender. No hepatomegaly. Vascular: No leg edema. Neuro: Alert and oriented.  Skin: No rash. Port-A-Cath without erythema.    Lab Results:  Lab Results  Component Value Date   WBC 5.7 04/15/2015   HGB 13.1 04/15/2015   HCT 38.5 04/15/2015   MCV 89.5 04/15/2015   PLT 262 04/15/2015   NEUTROABS 3.0 04/15/2015    Imaging:  No results found.  Medications: I have reviewed the patient's current medications.  Assessment/Plan: 1. Local recurrence of anal squamous cell carcinoma, with positive surgical margines, new small liver lesions on scan 10/11/2014, and biopsy proven sacral bone metastasis 03/15/2015  2. Diabetes 3. HIV 4. Hypertension and coronary artery disease 5. Sacral pain secondary to #1 6. Depression   Disposition: Julian Palmer appears stable. Plan to proceed with cycle 1 nivolumab today as scheduled. We again reviewed potential toxicities. He will return for a follow-up visit and cycle 2 nivolumab in 2 weeks. He will contact the office in the interim with any problems.  He will try Percocet for the sacral pain. He will contact the office if not effective.  Plan reviewed with Dr. Burr Medico.  Ned Card ANP/GNP-BC   04/15/2015  11:05 AM

## 2015-04-15 NOTE — Patient Instructions (Signed)
[facility] Discharge Instructions for Patients Receiving Chemotherapy  Today you received the following chemotherapy agents; Nivolumab (Opdivo)  To help prevent nausea and vomiting after your treatment, we encourage you to take your nausea medication as directed.    If you develop nausea and vomiting that is not controlled by your nausea medication, call the clinic.   BELOW ARE SYMPTOMS THAT SHOULD BE REPORTED IMMEDIATELY:  *FEVER GREATER THAN 100.5 F  *CHILLS WITH OR WITHOUT FEVER  NAUSEA AND VOMITING THAT IS NOT CONTROLLED WITH YOUR NAUSEA MEDICATION  *UNUSUAL SHORTNESS OF BREATH  *UNUSUAL BRUISING OR BLEEDING  TENDERNESS IN MOUTH AND THROAT WITH OR WITHOUT PRESENCE OF ULCERS  *URINARY PROBLEMS  *BOWEL PROBLEMS  UNUSUAL RASH Items with * indicate a potential emergency and should be followed up as soon as possible.  Feel free to call the clinic you have any questions or concerns. The clinic phone number is [phone].  Please show the [facility] at check-in to the Emergency Department and triage nurse.  Nivolumab injection What is this medicine? NIVOLUMAB (nye VOL ue mab) is a monoclonal antibody. It is used to treat melanoma, lung cancer, kidney cancer, and Hodgkin lymphoma. This medicine may be used for other purposes; ask your health care provider or pharmacist if you have questions. What should I tell my health care provider before I take this medicine? They need to know if you have any of these conditions: -diabetes -immune system problems -kidney disease -liver disease -lung disease -organ transplant -stomach or intestine problems -thyroid disease -an unusual or allergic reaction to nivolumab, other medicines, foods, dyes, or preservatives -pregnant or trying to get pregnant -breast-feeding How should I use this medicine? This medicine is for infusion into a vein. It is given by a health care professional in a hospital or clinic  setting. A special MedGuide will be given to you before each treatment. Be sure to read this information carefully each time. Talk to your pediatrician regarding the use of this medicine in children. Special care may be needed. Overdosage: If you think you have taken too much of this medicine contact a poison control center or emergency room at once. NOTE: This medicine is only for you. Do not share this medicine with others. What if I miss a dose? It is important not to miss your dose. Call your doctor or health care professional if you are unable to keep an appointment. What may interact with this medicine? Interactions have not been studied. Give your health care provider a list of all the medicines, herbs, non-prescription drugs, or dietary supplements you use. Also tell them if you smoke, drink alcohol, or use illegal drugs. Some items may interact with your medicine. This list may not describe all possible interactions. Give your health care provider a list of all the medicines, herbs, non-prescription drugs, or dietary supplements you use. Also tell them if you smoke, drink alcohol, or use illegal drugs. Some items may interact with your medicine. What should I watch for while using this medicine? This drug may make you feel generally unwell. Continue your course of treatment even though you feel ill unless your doctor tells you to stop. You may need blood work done while you are taking this medicine. Do not become pregnant while taking this medicine or for 5 months after stopping it. Women should inform their doctor if they wish to become pregnant or think they might be pregnant. There is a potential for serious side effects to  an unborn child. Talk to your health care professional or pharmacist for more information. Do not breast-feed an infant while taking this medicine. What side effects may I notice from receiving this medicine? Side effects that you should report to your doctor or health  care professional as soon as possible: -allergic reactions like skin rash, itching or hives, swelling of the face, lips, or tongue -black, tarry stools -blood in the urine -bloody or watery diarrhea -changes in vision -change in sex drive -changes in emotions or moods -chest pain -confusion -cough -decreased appetite -diarrhea -facial flushing -feeling faint or lightheaded -fever, chills -hair loss -hallucination, loss of contact with reality -headache -irritable -joint pain -loss of memory -muscle pain -muscle weakness -seizures -shortness of breath -signs and symptoms of high blood sugar such as dizziness; dry mouth; dry skin; fruity breath; nausea; stomach pain; increased hunger or thirst; increased urination -signs and symptoms of kidney injury like trouble passing urine or change in the amount of urine -signs and symptoms of liver injury like dark yellow or brown urine; general ill feeling or flu-like symptoms; light-colored stools; loss of appetite; nausea; right upper belly pain; unusually weak or tired; yellowing of the eyes or skin -stiff neck -swelling of the ankles, feet, hands -weight gain Side effects that usually do not require medical attention (report to your doctor or health care professional if they continue or are bothersome): -bone pain -constipation -tiredness -vomiting This list may not describe all possible side effects. Call your doctor for medical advice about side effects. You may report side effects to FDA at 1-800-FDA-1088. Where should I keep my medicine? This drug is given in a hospital or clinic and will not be stored at home. NOTE: This sheet is a summary. It may not cover all possible information. If you have questions about this medicine, talk to your doctor, pharmacist, or health care provider.    2016, [employer]. (2014-11-10 10:03:42)

## 2015-04-18 ENCOUNTER — Telehealth: Payer: Self-pay | Admitting: Hematology

## 2015-04-18 NOTE — Telephone Encounter (Signed)
I spoke with Dr. Sondra Come about re-radiaition. He will consider and will see him, I left a message for pt about his radiation.   Truitt Merle  04/18/2015

## 2015-04-19 ENCOUNTER — Telehealth: Payer: Self-pay | Admitting: *Deleted

## 2015-04-19 NOTE — Telephone Encounter (Signed)
Records requested from [facility]  Patient had treatment there 2013

## 2015-04-25 ENCOUNTER — Encounter: Payer: Self-pay | Admitting: Radiation Oncology

## 2015-04-25 NOTE — Progress Notes (Addendum)
GI Location of Tumor / Histology: recurrent anal cancer  Julian Palmer Wonders presented with a tumor discovered on his PET scan from 02/24/15.   Biopsies revealed:   03/15/15  Diagnosis Bone, biopsy, caudal left side of the sacrum - METASTATIC SQUAMOUS CELL CARCINOMA, SEE COMMENT.  Past/Anticipated interventions by surgeon, if any: 2009 - sphincterectomy, 2012 - resection of anal mass, 2015 - removal rectal mass  Past/Anticipated interventions by medical oncology, if any: 02/05/2011 - concurrent chemo with 5FU, 06/28/2014 - 09/25/2014 5-FU 1000mg /m2/day on D1-5, ciplatin 100mg /m2 on D2, every 28 days, s/p 4 cycles, Nivo to be tentatively started two weeks ago (04/15/15).  Next one scheduled for this Friday.     Weight changes, if any: no  Bowel/Bladder complaints, if any: denies bladder issues, has a colostomy.  Nausea / Vomiting, if any: no  Pain issues, if any:  Yes - constant pain in sacrum area.  Rating at a 7/10 today.  Takes tramadol bid.  Reports he has trouble sleeping due to the pain.  Any blood per rectum:   no  SAFETY ISSUES:  Prior radiation? Yes - definitive dose RT 45 Gy over 5 weeks with 16Gy boosting 01/2011  Pacemaker/ICD? no  Possible current pregnancy? no  Is the patient on methotrexate? No  BP 115/81 mmHg  Pulse 96  Temp(Src) 98.9 F (37.2 C) (Oral)  Resp 16  Ht 5\' 10"  (1.778 m)  Wt 208 lb (94.348 kg)  BMI 29.84 kg/m2  SpO2 97%

## 2015-04-27 ENCOUNTER — Ambulatory Visit
Admission: RE | Admit: 2015-04-27 | Discharge: 2015-04-27 | Disposition: A | Discharge status: Home / Self Care | Payer: Managed Care, Other (non HMO) | Source: Ambulatory Visit | Attending: Radiation Oncology | Admitting: Radiation Oncology

## 2015-04-27 ENCOUNTER — Encounter: Payer: Self-pay | Admitting: Radiation Oncology

## 2015-04-27 VITALS — BP 115/81 | HR 96 | Temp 98.9°F | Resp 16 | Ht 70.0 in | Wt 208.0 lb

## 2015-04-27 DIAGNOSIS — C7951 Secondary malignant neoplasm of bone: Secondary | ICD-10-CM | POA: Diagnosis present

## 2015-04-27 DIAGNOSIS — C211 Malignant neoplasm of anal canal: Secondary | ICD-10-CM | POA: Insufficient documentation

## 2015-04-27 DIAGNOSIS — Z923 Personal history of irradiation: Secondary | ICD-10-CM | POA: Diagnosis not present

## 2015-04-27 DIAGNOSIS — C210 Malignant neoplasm of anus, unspecified: Secondary | ICD-10-CM

## 2015-04-27 HISTORY — DX: Reserved for concepts with insufficient information to code with codable children: IMO0002

## 2015-04-27 NOTE — Progress Notes (Signed)
Radiation Oncology         [phone] ________________________________  Name: Julian Palmer MRN: [id number]  Date: 04/27/2015  DOB: September 19, 1960  Follow-Up Visit Note  CC: [name], MD  Truitt Merle, MD    ICD-9-CM ICD-10-CM   1. Primary cancer of anal canal ([place]) 154.2 C21.1     Diagnosis:   Recurrent anal carcinoma, osseous metastasis with pain  Previous Radiation Therapy: Yes, definitive dose RT 45 Gy over 5 weeks with 16 Gy boost 01/2011; [facility]) Chemotherapy: 02/05/2011 - concurrent chemo with 5FU, 06/28/2014 - 09/25/2014 5-FU 1000mg /m2/day on D1-5, ciplatin 100mg /m2 on D2, every 28 days, s/p 4 cycles, Nivo to be tentatively started two weeks ago (04/15/15). Next one scheduled for this Friday, 11/4.  Narrative:  The patient is here as courtesy of Dr. Burr Medico. The patient presented with a tumor discovered on his PET scan from 02/24/15. Bone biopsy of the sacrum performed on 03/15/15 revealed metastatic squamous cell carcinoma.   Constant pain in sacrum area began bothering him about 2 months ago. Rating at a 7/10 today. He takes "anything he can get his hands on" to manage the pain. States that the pain is more on the left side than the right. Taking tramadol bid and ibuprofen. He has not yet taken oxycodone to avoid constipation. Reports he has trouble sleeping due to the pain. Denies numbness or weakness in legs. Reports numbness in feet is unchanged. Denies bladder issues, has a colostomy in place                            ALLERGIES:  is allergic to ace inhibitors and sulfa antibiotics.  Meds: Current Outpatient Prescriptions  Medication Sig Dispense Refill  . aspirin (ASPIR-81) 81 MG EC tablet Take 81 mg by mouth at bedtime.     Marland Kitchen atorvastatin (LIPITOR) 80 MG tablet Take 1 tablet (80 mg total) by mouth every evening. 90 tablet 0  . citalopram (CELEXA) 40 MG tablet Take 40 mg by mouth every morning.    Marland Kitchen efavirenz-emtrictabine-tenofovir (ATRIPLA) 600-200-300  MG per tablet Take 1 tablet by mouth at bedtime.     . Empagliflozin-Linagliptin (GLYXAMBI) 25-5 MG TABS Take 1 tablet by mouth daily.    Marland Kitchen glimepiride (AMARYL) 2 MG tablet Take 4 mg by mouth daily with breakfast.     . losartan (COZAAR) 100 MG tablet TAKE 1 TABLET EVERY DAY (Patient taking differently: TAKE 1 TABLET EVERY DAY at bedtime) 90 tablet 3  . metFORMIN (GLUCOPHAGE) 1000 MG tablet Take 1,000 mg by mouth 2 (two) times daily.      . Multiple Vitamins-Minerals (MULTIVITAL) tablet Take 1 tablet by mouth every evening.     . niacin (NIASPAN) 1000 MG CR tablet Take 1 tablet (1,000 mg total) by mouth at bedtime. 90 tablet 3  . oxyCODONE-acetaminophen (PERCOCET/ROXICET) 5-325 MG tablet TAKE 1 TO 2 TABLETS BY MOUTH EVERY 4 HOURS AS NEEDED FOR PAIN . MAX 9/DAY  0  . traMADol (ULTRAM) 50 MG tablet Take 1 tablet (50 mg total) by mouth every 6 (six) hours as needed. 60 tablet 2  . lidocaine-prilocaine (EMLA) cream Apply topically as needed.     No current facility-administered medications for this encounter.   Facility-Administered Medications Ordered in Other Encounters  Medication Dose Route Frequency Provider Last Rate Last Dose  . heparin lock flush 100 unit/mL  500 Units Intravenous Once Truitt Merle, MD      .  sodium chloride 0.9 % injection 10 mL  10 mL Intravenous PRN Truitt Merle, MD        Physical Findings: The patient is in no acute distress. Patient is alert and oriented.  height is 5\' 10"  (1.778 m) and weight is 208 lb (94.348 kg). His oral temperature is 98.9 F (37.2 C). His blood pressure is 115/81 and his pulse is 96. His respiration is 16 and oxygen saturation is 97%. .  No significant changes. Lungs are clear to auscultation bilaterally. Heart has regular rate and rhythm. No palpable cervical, supraclavicular, or axillary adenopathy. Motor exam 5/5 in proximal and distal muscle groups of lower extremities.   Lab Findings: Lab Results  Component Value Date   WBC 5.7 04/15/2015    HGB 13.1 04/15/2015   HCT 38.5 04/15/2015   MCV 89.5 04/15/2015   PLT 262 04/15/2015    Radiographic Findings: No results found.  Impression:  Recurrent anal carcinoma primarily involving bone of the right sacrum. Given the patient's intensity of pain and lack of reasonable options he would be a candidate for palliative radiation therapy. We will review his treatment at [place] to see how much of this was covered with his previous radiation fields. We will discuss with the [facility] team to see if he would be a candidate SRS/SBRT treatment to this area.   Plan:  The patient will call us to make an appointment for simulation and treatment planning if he decides to proceed with radiation therapy.   -----------------------------------  Blair Promise, PhD, MD  This document serves as a record of services personally performed by Gery Pray, MD. It was created on his behalf by Arlyce Harman, a trained medical scribe. The creation of this record is based on the scribe's personal observations and the provider's statements to them. This document has been checked and approved by the attending provider.

## 2015-04-27 NOTE — Progress Notes (Signed)
Please see the Nurse Progress Note in the MD Initial Consult Encounter for this patient. 

## 2015-04-29 ENCOUNTER — Other Ambulatory Visit (HOSPITAL_BASED_OUTPATIENT_CLINIC_OR_DEPARTMENT_OTHER): Payer: Managed Care, Other (non HMO)

## 2015-04-29 ENCOUNTER — Ambulatory Visit (HOSPITAL_BASED_OUTPATIENT_CLINIC_OR_DEPARTMENT_OTHER): Payer: Managed Care, Other (non HMO) | Admitting: Hematology

## 2015-04-29 ENCOUNTER — Ambulatory Visit (HOSPITAL_BASED_OUTPATIENT_CLINIC_OR_DEPARTMENT_OTHER): Payer: Managed Care, Other (non HMO)

## 2015-04-29 ENCOUNTER — Encounter: Payer: Self-pay | Admitting: Hematology

## 2015-04-29 VITALS — BP 137/89 | HR 79 | Temp 98.2°F | Resp 18 | Ht 70.0 in | Wt 212.1 lb

## 2015-04-29 DIAGNOSIS — C211 Malignant neoplasm of anal canal: Secondary | ICD-10-CM | POA: Diagnosis not present

## 2015-04-29 DIAGNOSIS — I10 Essential (primary) hypertension: Secondary | ICD-10-CM

## 2015-04-29 DIAGNOSIS — Z5112 Encounter for antineoplastic immunotherapy: Secondary | ICD-10-CM

## 2015-04-29 DIAGNOSIS — F329 Major depressive disorder, single episode, unspecified: Secondary | ICD-10-CM

## 2015-04-29 DIAGNOSIS — R5383 Other fatigue: Secondary | ICD-10-CM | POA: Diagnosis not present

## 2015-04-29 DIAGNOSIS — B20 Human immunodeficiency virus [HIV] disease: Secondary | ICD-10-CM | POA: Diagnosis not present

## 2015-04-29 DIAGNOSIS — C7951 Secondary malignant neoplasm of bone: Secondary | ICD-10-CM | POA: Diagnosis not present

## 2015-04-29 DIAGNOSIS — Z85048 Personal history of other malignant neoplasm of rectum, rectosigmoid junction, and anus: Secondary | ICD-10-CM

## 2015-04-29 DIAGNOSIS — E119 Type 2 diabetes mellitus without complications: Secondary | ICD-10-CM

## 2015-04-29 LAB — CBC WITH DIFFERENTIAL/PLATELET
BASO%: 1.2 % (ref 0.0–2.0)
Basophils Absolute: 0.1 10*3/uL (ref 0.0–0.1)
EOS%: 5.1 % (ref 0.0–7.0)
Eosinophils Absolute: 0.3 10*3/uL (ref 0.0–0.5)
HCT: 36.7 % — ABNORMAL LOW (ref 38.4–49.9)
HEMOGLOBIN: 12.4 g/dL — AB (ref 13.0–17.1)
LYMPH%: 28.4 % (ref 14.0–49.0)
MCH: 30.3 pg (ref 27.2–33.4)
MCHC: 33.7 g/dL (ref 32.0–36.0)
MCV: 90 fL (ref 79.3–98.0)
MONO#: 0.9 10*3/uL (ref 0.1–0.9)
MONO%: 17 % — AB (ref 0.0–14.0)
NEUT%: 48.3 % (ref 39.0–75.0)
NEUTROS ABS: 2.5 10*3/uL (ref 1.5–6.5)
Platelets: 263 10*3/uL (ref 140–400)
RBC: 4.07 10*6/uL — AB (ref 4.20–5.82)
RDW: 14.3 % (ref 11.0–14.6)
WBC: 5.1 10*3/uL (ref 4.0–10.3)
lymph#: 1.5 10*3/uL (ref 0.9–3.3)

## 2015-04-29 LAB — COMPREHENSIVE METABOLIC PANEL (CC13)
ALBUMIN: 3.8 g/dL (ref 3.5–5.0)
ALK PHOS: 135 U/L (ref 40–150)
ALT: 51 U/L (ref 0–55)
AST: 53 U/L — AB (ref 5–34)
Anion Gap: 9 mEq/L (ref 3–11)
BUN: 24.1 mg/dL (ref 7.0–26.0)
CO2: 24 meq/L (ref 22–29)
Calcium: 9.5 mg/dL (ref 8.4–10.4)
Chloride: 105 mEq/L (ref 98–109)
Creatinine: 1.3 mg/dL (ref 0.7–1.3)
EGFR: 63 mL/min/{1.73_m2} — AB (ref >90)
GLUCOSE: 123 mg/dL (ref 70–140)
POTASSIUM: 4.4 meq/L (ref 3.5–5.1)
SODIUM: 139 meq/L (ref 136–145)
TOTAL PROTEIN: 8.1 g/dL (ref 6.4–8.3)
Total Bilirubin: <0.3 mg/dL (ref 0.20–1.20)

## 2015-04-29 MED ORDER — SODIUM CHLORIDE 0.9 % IJ SOLN
10.0000 mL | INTRAMUSCULAR | Status: DC | PRN
  Administered 2015-04-29: 10 mL
  Filled 2015-04-29: qty 10

## 2015-04-29 MED ORDER — HEPARIN SOD (PORK) LOCK FLUSH 100 UNIT/ML IV SOLN
500.0000 [IU] | Freq: Once | INTRAVENOUS | Status: AC | PRN
  Administered 2015-04-29: 500 [IU]
  Filled 2015-04-29: qty 5

## 2015-04-29 MED ORDER — NIVOLUMAB CHEMO INJECTION 100 MG/10ML
240.0000 mg | Freq: Once | INTRAVENOUS | Status: AC
  Administered 2015-04-29: 240 mg via INTRAVENOUS
  Filled 2015-04-29: qty 20

## 2015-04-29 MED ORDER — SODIUM CHLORIDE 0.9 % IV SOLN
Freq: Once | INTRAVENOUS | Status: AC
  Administered 2015-04-29: 15:00:00 via INTRAVENOUS

## 2015-04-29 NOTE — Progress Notes (Signed)
[place] NOTE  Patient Care Team: [name], MD as PCP - General (Infectious Diseases) Robert Bellow, MD (General Surgery) Leighton Ruff, MD as Consulting Physician (General Surgery) Irene Limbo, MD as Consulting Physician (Plastic Surgery) Truitt Merle, MD as Consulting Physician (Hematology) Festus Aloe, MD as Consulting Physician (Urology)  DIAGNOSIS Recurrent anal cancer    Primary cancer of anal canal ([place])   02/05/2011 Initial Diagnosis Primary cancer of anal canal, T1N0M0, s/p surgical resection with positive margines. He received definitive dose RT 45 Gy over 5 weeks with 16Gy boosting and concurrent chemo with 5FU, treated at [facility] by Drs.  Geoffreyh and [employer].    11/23/2013 Relapse/Recurrence local recurrence    04/21/2014 Surgery abdominal perianal resection (APR) and VRAM flap closure of perineal wound on 04/21/14, surgical margins were positive for cancer    06/28/2014 - 09/25/2014 Chemotherapy 5-FU 1000mg /m2/day on D1-5, ciplatin 100mg /m2 on D2, every 28 days, s/p 4 cycles    10/11/2014 Progression CT CAP showed new liver lesions and abd/pelc adenopathy, highly suspecious for progressive metastatic disease    11/02/2014 Imaging abdomen MRI w wo contrast showed new small lesions in the right hepatic lobe with nonspecific enhancement, and enlarging nodes in the porta hepatis, metastatic disease not excluded    02/24/2015 Imaging PET scan showed hypermetabolic bone mets in right sacrum, and small hypermetabolic retroperitoneal lymph nodes, no other abnormal metabolic activity within the liver or other place.   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   04/15/2015 -  Chemotherapy Nivolumab 240 mg every 2 weeks     OTHER RELATED ISSUES: 1. (+) HIV, on therapy with good control  2. CAD, s/p stent placement in [place]: Nivolumab 240mg  iv every 2 weeks, started  on 04/15/2015  INTERIM HISTORY: Connell returns for follow up. He started Nivo 2 weeks ago, and tolerated the first infusion well. He did have mild to moderate fatigue the day after the infusion, slight skin itchiness, no rash, no other noticeable side effects. His sacral pain is around 6-7/10, she does not like to take Vicodin, he takes tramadol and ibuprofen as needed 1-2 times a day. I referred him to radiation oncology Dr. Sondra Come, who did offer him radiation, he has not decided if he wants to pursue.    MEDICAL HISTORY:  Past Medical History  Diagnosis Date  . S/p bare metal coronary artery stent     1998  pLAD  . Hypertension   . 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 Hyperlipidemia   . History of carcinoma in situ of anal canal     2010  . ED (erectile dysfunction)   . Sigmoid diverticulosis   . PONV (postoperative nausea and vomiting)   . Wears glasses   . At risk for sleep apnea     STOP-BANG= 4     SENT TO PCP 03-23-2014  . Coronary artery disease CARDIOLOGIST--  DR Rockey Situ --  LAST LOV 04-17-2013    S/P  PCI to mid and distal LAD and BM stent x1 to pLAD  . Anxiety   . Depression   . HIV positive ([place])   . 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 Primary cancer of anal  canal ([place]) 05/18/2011  . Type 2 diabetes mellitus ([place])   . Radiation 01/2011    definitive dose RT 45 Gy over 5 weeks with 16Gy boosting    SURGICAL HISTORY: Past Surgical History  Procedure Laterality Date  . Septoplasty  1985  . Sphincterotomy  2009  . Cardiovascular stress test  04/ 2010    low risk study/  very small region of mildly reduced perfersion is mildly reversible dLAD territory  . Resection anal mass  02-05-2011  . Excision anal polyp  2009  . Re-excision anal squamous cell carcinoma  2010  . Removal  rectal mass  01-08-2014  . Eua w/ bx perianal skin  06-08-2009  . Port-a-cath placement  02-21-2011    REMOVED  2013  . Coronary angioplasty with stent placement  1998   [facility]    BM stent to pLAD and PCI to Mid and Distal LAD/  no other significant cad  . Inguinal hernia repair Bilateral 1988  . Colonoscopy with propofol  05-28-2008  . Mass excision N/A 03/26/2014    Procedure: ANAL EXAM UNDER ANESTHESIA,EXCISIONAL BIOPSY OF ANAL MASS;  Surgeon: Leighton Ruff, MD;  Location: [facility];  Service: General;  Laterality: N/A;  . Laparoscopic assisted abdominal perineal resection N/A 04/22/2014    Procedure: LAPAROSCOPIC  ABDOMINAL PERINEAL RESECTION, ;  Surgeon: Leighton Ruff, MD;  Location: [facility];  Service: General;  Laterality: N/A;  . Urethrotomy Bilateral 04/22/2014    Procedure: CYSTOSCOPY/URETHROTOMY;  Surgeon: Leighton Ruff, MD;  Location: [facility];  Service: General;  Laterality: Bilateral;  . Muscle flap closure N/A 04/22/2014    Procedure: VRAM FLAP;  Surgeon: Irene Limbo, MD;  Location: [facility];  Service: Plastics;  Laterality: N/A;  . Portacath placement Left 06/24/2014    Procedure: INSERTION PORT-A-CATH LEFT SUBCLAVIAN;  Surgeon: Leighton Ruff, MD;  Location: [facility];  Service: General;  Laterality: Left;    SOCIAL HISTORY: Social History   Social History  . Marital Status: Single    Spouse Name: N/A  . Number of Children: 0  . Years of Education: N/A   Occupational History  .  [employer] History Main Topics  . Smoking status: Never Smoker   . Smokeless tobacco: Never Used  . Alcohol Use: No     Comment: RARE  . Drug Use: No  . Sexual Activity: Not on file   Other Topics Concern  . Not on file   Social History Narrative   Full time. Single. Does not regularly exercise.     FAMILY HISTORY: Family History  Problem Relation Age of Onset  . Diabetes type II Mother   . Breast cancer Mother 6     survivor  . Hypertension  Mother   . Coronary artery disease Father   Paternal GM had utrine cancer, no other history of malignancy   ALLERGIES:  is allergic to ace inhibitors and sulfa antibiotics.  MEDICATIONS:  Current Outpatient Prescriptions  Medication Sig Dispense Refill  . aspirin (ASPIR-81) 81 MG EC tablet Take 81 mg by mouth at bedtime.     Marland Kitchen atorvastatin (LIPITOR) 80 MG tablet Take 1 tablet (80 mg total) by mouth every evening. 90 tablet 0  . citalopram (CELEXA) 40 MG tablet Take 40 mg by mouth every morning.    Marland Kitchen efavirenz-emtrictabine-tenofovir (ATRIPLA) 600-200-300 MG per tablet Take 1 tablet by mouth at bedtime.     . Empagliflozin-Linagliptin (GLYXAMBI) 25-5 MG TABS Take 1 tablet by mouth daily.    Marland Kitchen  glimepiride (AMARYL) 2 MG tablet Take 4 mg by mouth daily with breakfast.     . lidocaine-prilocaine (EMLA) cream Apply topically as needed.    Marland Kitchen losartan (COZAAR) 100 MG tablet TAKE 1 TABLET EVERY DAY (Patient taking differently: TAKE 1 TABLET EVERY DAY at bedtime) 90 tablet 3  . metFORMIN (GLUCOPHAGE) 1000 MG tablet Take 1,000 mg by mouth 2 (two) times daily.      . Multiple Vitamins-Minerals (MULTIVITAL) tablet Take 1 tablet by mouth every evening.     . niacin (NIASPAN) 1000 MG CR tablet Take 1 tablet (1,000 mg total) by mouth at bedtime. 90 tablet 3  . oxyCODONE-acetaminophen (PERCOCET/ROXICET) 5-325 MG tablet TAKE 1 TO 2 TABLETS BY MOUTH EVERY 4 HOURS AS NEEDED FOR PAIN . MAX 9/DAY  0  . traMADol (ULTRAM) 50 MG tablet Take 1 tablet (50 mg total) by mouth every 6 (six) hours as needed. 60 tablet 2   No current facility-administered medications for this visit.   Facility-Administered Medications Ordered in Other Visits  Medication Dose Route Frequency Provider Last Rate Last Dose  . heparin lock flush 100 unit/mL  500 Units Intravenous Once Truitt Merle, MD      . heparin lock flush 100 unit/mL  500 Units Intracatheter Once PRN Truitt Merle, MD      . nivolumab (OPDIVO) 240 mg in sodium chloride 0.9 % 100  mL chemo infusion  240 mg Intravenous Once Truitt Merle, MD 124 mL/hr at 04/29/15 1546 240 mg at 04/29/15 1546  . sodium chloride 0.9 % injection 10 mL  10 mL Intravenous PRN Truitt Merle, MD      . sodium chloride 0.9 % injection 10 mL  10 mL Intracatheter PRN Truitt Merle, MD        REVIEW OF SYSTEMS:   Constitutional: Denies fevers, chills or abnormal night sweats Eyes: Denies blurriness of vision, double vision or watery eyes Ears, nose, mouth, throat, and face: Denies mucositis or sore throat Respiratory: Denies cough, dyspnea or wheezes Cardiovascular: Denies palpitation, chest discomfort or lower extremity swelling Gastrointestinal:  Denies nausea, heartburn or change in bowel habits, (+) colostomy bag Skin: Mild skin pigmentation from his previous rash. Lymphatics: Denies new lymphadenopathy or easy bruising Neurological:Denies numbness, tingling or new weaknesses Behavioral/Psych: Mood is stable, no new changes  All other systems were reviewed with the patient and are negative.  PHYSICAL EXAMINATION: ECOG PERFORMANCE STATUS: 0  Filed Vitals:   04/29/15 1500  BP: 137/89  Pulse: 79  Temp: 98.2 F (36.8 C)  Resp: 18   Filed Weights   04/29/15 1500  Weight: 212 lb 1.6 oz (96.208 kg)    GENERAL:alert, no distress and comfortable SKIN: skin color, texture, turgor are normal, healed rashes on her arms, legs, and trunk with skin pigmentation.  EYES: normal, conjunctiva are pink and non-injected, sclera clear OROPHARYNX:no exudate, no erythema and lips, buccal mucosa, and tongue normal  NECK: supple, thyroid normal size, non-tender, without nodularity LYMPH:  no palpable lymphadenopathy in the cervical, axillary or inguinal LUNGS: clear to auscultation and percussion with normal breathing effort HEART: regular rate & rhythm and no murmurs and no lower extremity edema ABDOMEN:abdomen soft, non-tender and normal bowel sounds. (+) Colostomy bag. The perianal reconstruction site is  completely healed.  Musculoskeletal:no cyanosis of digits and no clubbing  PSYCH: alert & oriented x 3 with fluent speech NEURO: no focal motor/sensory deficits SKIN: A few skin pigmentation from his previous rash  LABORATORY DATA:  I have reviewed the data  as listed CBC Latest Ref Rng 04/29/2015 04/15/2015 03/15/2015  WBC 4.0 - 10.3 10e3/uL 5.1 5.7 5.9  Hemoglobin 13.0 - 17.1 g/dL 12.4(L) 13.1 12.8(L)  Hematocrit 38.4 - 49.9 % 36.7(L) 38.5 38.6(L)  Platelets 140 - 400 10e3/uL 263 262 226     Recent Labs  02/18/15 0812 04/15/15 0944 04/29/15 1348  NA 140 140 139  K 4.5 4.4 4.4  CO2 25 25 24   GLUCOSE 130 87 123  BUN 25.9 24.5 24.1  CREATININE 1.3 1.3 1.3  CALCIUM 9.8 10.1 9.5  PROT 8.1 8.6* 8.1  ALBUMIN 4.0 3.9 3.8  AST 25 44* 53*  ALT 21 38 51  ALKPHOS 85 125 135  BILITOT 0.21 <0.30 <0.30     PATHOLOGY REPORT 04/22/2014 Diagnosis 1. Soft tissue, biopsy, right pelvic sidewall; r/o malignancy - FIBROADIPOSE TISSUE, NO EVIDENCE OF MALIGNANCY. 2. Colon, segmental resection for tumor, rectum,sigmoid and anus - RECURRENT INVASIVE POORLY DIFFERENTIATED SQUAMOUS CELL CARCINOMA, INVADING THROUGH THE MUSCULARIS PROPRIA, INTO PERICOLONIC FATTY TISSUE, EXTENDING TO THE DEEP INKED MARGIN. - PERINEURAL INVASION AND ANGIOLYMPHATIC PRESENT. - SEVEN OF SEVENTEEN LYMPH NODES, POSITIVE FOR METASTATIC CARCINOMA (7/17) Microscopic Comment 2. Anus Specimen: Anus, rectum, sigmoid Procedure: Segmental resection Tumor site: Distal ends of specimen Specimen integrity: Intact Invasive tumor: Maximum size: At least 2.5 cm Histologic type(s): Invasive squamous cell carcinoma Histologic grade and differentiation: G2-3, moderately to poorly differentiated Microscopic extension of invasive tumor: Invading through the muscularis propria into pericolonic fatty tissue involving adjacent skeletal muscle tissue Lymph-Vascular invasion: Present Peri-neural invasion: Present Resection margins: Deep  margin is positive for tumor Treatment effect (neo-adjuvant therapy): Present, minimally Lymph nodes: number examined 17; number positive: 7 Pathologic Staging: ypT2, yN1 (R1) Ancillary studies: N/A  Diagnosis 03/15/2015 Bone, biopsy, caudal left side of the sacrum - METASTATIC SQUAMOUS CELL CARCINOMA, SEE COMMENT. Microscopic Comment Biopsies demonstrate extensive involvement by metastatic focally keratinizing, well to moderately differentiated squamous cell carcinoma. The previous anorectal resection demonstrating invasive squamous cell carcinoma is noted ([id number]). (CRR:ecj 03/16/2015)  RADIOGRAPHIC STUDIES: I have personally reviewed the radiological images as listed and agreed with the findings in the report.  CT chest/abd/pelvis with contrast 10/11/2014 IMPRESSION: Interval development of new hypodense ill-defined hepatic lesions which, in the context of increased gastrohepatic, retroperitoneal, and pelvic sidewall lymphadenopathy, are highly suspicious for progressive metastatic disease.  No evidence for intrathoracic metastatic disease or acute abnormality.  Abdomen MRI w wo contrast 11/02/2014  IMPRESSION: 1. 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 2. The new smaller lesions within the right hepatic lobe are confirmed with nonspecific imaging/enhancement characteristics. Given the fact that these are new, and that there are enlarging lymph nodes within the porta hepatis, metastatic disease cannot be excluded. Correlation with tumor markers recommended. If metastatic disease remains uncertain clinically, further evaluation with PET-CT or short-term follow-up MRI could be performed. 3. Cholelithiasis without evidence of biliary dilatation.  Pet 02/24/2015  IMPRESSION: 1. Dominant finding is new hypermetabolic skeletal metastasis in the RIGHT sacrum. 2. Small hypermetabolic retroperitoneal periaortic and periportal lymph nodes consistent with nodal  metastasis to retroperitoneum. 3. No abnormal metabolic activity within the liver to suggest metastasis. Lesions on comparison MRI were very small which could limit PET accuracy.  ASSESSMENT & PLAN:  54 years old male with past medical history of anal squamous cell carcinoma, status post surgical resection and concurrent chemoradiation in 2009, now has local recurrence status post APR in the reconstruction.ypT2, yN1 (R1), unfortunately his deep surgical margin was positive, and 7 out of  17 lymph nodes were positive also. No distant metastasis on the preop PET scan. His medical history is also complicated by diabetes, coronary artery disease status post stent placement, and HIV on treatment. Performance status 1.  1. Local recurrence of anal squamous cell carcinoma, with positive surgical margines, biopsy proven sacral bone mets on 03/15/2015 -His recent PET scan from 02/24/2015 showed hypermetabolic skeletal metastasis in the right sacrum, and a few small hypermetabolic retroperitoneal lymph nodes, liver tissue was non-hypermetabolic, no other distant metastasis. -I discussed his sacral bone biopsy results, unfortunately he confirmed metastasis. -Patient does not want more chemotherapy. I discussed the clinical trial of immunotherapy new follow him at in metastatic and no cancer, the data was present in ASCO 2016. It is small study, a total of 37 patient's, showed partial response rate about 20% and overall disease control rate about 80%. -I have started him on the Nivo, which was offered by [facility], and our cancer center waves his infusion fee.  -Lab reviewed, adequate for treatment, his TSH is normal, he is tolerating well. We'll proceed with cycle 2 today.  2. Diabetes -controlled recently  -continue metformin and glimepiride, follow up with PCP   3. HIV  -Well controlled. Continue his treatment . His recent CD4 count was normal.   4. Hypertension and coronary artery disease  -He will continue  follow-up with his primary care physician and cardiologist. Continue medications.   5. Sacral pain -Secondary to his sacral bone metastasis -Continue tramadol and ibuprofen as needed  -He still debating about radiation. If his pain is not improved in a month, he probably will get palliative radiation  -We discussed that if he is responding to [facility], his pain will get better, however it may take a few months of treatment to see the effect.  6. depression -improved since he started treatment lately.   Plan -cycle 2 Nivo today and continue every 2 weeks -I'll see him back in 2 weeks, to reevaluate his pain    All questions were answered. The patient knows to call the clinic with any problems, questions or concerns.   I spent 25 minutes counseling the patient face to face. The total time spent in the appointment was 30 minutes and more than 50% was on counseling.     Truitt Merle, MD 04/29/2015 4:42 PM

## 2015-04-29 NOTE — Patient Instructions (Signed)
[facility] Discharge Instructions for Patients Receiving Chemotherapy  Today you received the following chemotherapy agents:  Nivolumab.  To help prevent nausea and vomiting after your treatment, we encourage you to take your nausea medication as directed.   If you develop nausea and vomiting that is not controlled by your nausea medication, call the clinic.   BELOW ARE SYMPTOMS THAT SHOULD BE REPORTED IMMEDIATELY:  *FEVER GREATER THAN 100.5 F  *CHILLS WITH OR WITHOUT FEVER  NAUSEA AND VOMITING THAT IS NOT CONTROLLED WITH YOUR NAUSEA MEDICATION  *UNUSUAL SHORTNESS OF BREATH  *UNUSUAL BRUISING OR BLEEDING  TENDERNESS IN MOUTH AND THROAT WITH OR WITHOUT PRESENCE OF ULCERS  *URINARY PROBLEMS  *BOWEL PROBLEMS  UNUSUAL RASH Items with * indicate a potential emergency and should be followed up as soon as possible.  Feel free to call the clinic you have any questions or concerns. The clinic phone number is [phone].  Please show the CHEMO ALERT CARD at check-in to the Emergency Department and triage nurse.   

## 2015-05-13 ENCOUNTER — Telehealth: Payer: Self-pay | Admitting: *Deleted

## 2015-05-13 ENCOUNTER — Telehealth: Payer: Self-pay | Admitting: Hematology

## 2015-05-13 ENCOUNTER — Other Ambulatory Visit (HOSPITAL_BASED_OUTPATIENT_CLINIC_OR_DEPARTMENT_OTHER): Payer: Managed Care, Other (non HMO)

## 2015-05-13 ENCOUNTER — Encounter: Payer: Self-pay | Admitting: Hematology

## 2015-05-13 ENCOUNTER — Ambulatory Visit (HOSPITAL_BASED_OUTPATIENT_CLINIC_OR_DEPARTMENT_OTHER): Payer: Managed Care, Other (non HMO) | Admitting: Hematology

## 2015-05-13 ENCOUNTER — Ambulatory Visit (HOSPITAL_BASED_OUTPATIENT_CLINIC_OR_DEPARTMENT_OTHER): Payer: Managed Care, Other (non HMO)

## 2015-05-13 VITALS — BP 139/79 | HR 83 | Temp 97.8°F | Resp 20 | Ht 70.0 in | Wt 213.5 lb

## 2015-05-13 DIAGNOSIS — C211 Malignant neoplasm of anal canal: Secondary | ICD-10-CM

## 2015-05-13 DIAGNOSIS — R5383 Other fatigue: Secondary | ICD-10-CM

## 2015-05-13 DIAGNOSIS — Z5112 Encounter for antineoplastic immunotherapy: Secondary | ICD-10-CM | POA: Diagnosis not present

## 2015-05-13 DIAGNOSIS — B20 Human immunodeficiency virus [HIV] disease: Secondary | ICD-10-CM | POA: Diagnosis not present

## 2015-05-13 DIAGNOSIS — C7951 Secondary malignant neoplasm of bone: Secondary | ICD-10-CM

## 2015-05-13 DIAGNOSIS — G893 Neoplasm related pain (acute) (chronic): Secondary | ICD-10-CM

## 2015-05-13 DIAGNOSIS — F329 Major depressive disorder, single episode, unspecified: Secondary | ICD-10-CM

## 2015-05-13 DIAGNOSIS — Z85048 Personal history of other malignant neoplasm of rectum, rectosigmoid junction, and anus: Secondary | ICD-10-CM

## 2015-05-13 LAB — COMPREHENSIVE METABOLIC PANEL (CC13)
ALT: 27 U/L (ref 0–55)
AST: 32 U/L (ref 5–34)
Albumin: 3.8 g/dL (ref 3.5–5.0)
Alkaline Phosphatase: 114 U/L (ref 40–150)
Anion Gap: 11 mEq/L (ref 3–11)
BUN: 25.9 mg/dL (ref 7.0–26.0)
CHLORIDE: 104 meq/L (ref 98–109)
CO2: 24 mEq/L (ref 22–29)
Calcium: 9.1 mg/dL (ref 8.4–10.4)
Creatinine: 1.3 mg/dL (ref 0.7–1.3)
EGFR: 61 mL/min/{1.73_m2} — ABNORMAL LOW (ref >90)
GLUCOSE: 107 mg/dL (ref 70–140)
POTASSIUM: 4 meq/L (ref 3.5–5.1)
SODIUM: 138 meq/L (ref 136–145)
Total Bilirubin: <0.3 mg/dL (ref 0.20–1.20)
Total Protein: 8.1 g/dL (ref 6.4–8.3)

## 2015-05-13 LAB — CBC WITH DIFFERENTIAL/PLATELET
BASO%: 0.9 % (ref 0.0–2.0)
BASOS ABS: 0 10*3/uL (ref 0.0–0.1)
EOS ABS: 0.2 10*3/uL (ref 0.0–0.5)
EOS%: 4.5 % (ref 0.0–7.0)
HCT: 36.1 % — ABNORMAL LOW (ref 38.4–49.9)
HGB: 12.1 g/dL — ABNORMAL LOW (ref 13.0–17.1)
LYMPH#: 1.4 10*3/uL (ref 0.9–3.3)
LYMPH%: 25.6 % (ref 14.0–49.0)
MCH: 30.2 pg (ref 27.2–33.4)
MCHC: 33.5 g/dL (ref 32.0–36.0)
MCV: 90.2 fL (ref 79.3–98.0)
MONO#: 0.9 10*3/uL (ref 0.1–0.9)
MONO%: 16 % — ABNORMAL HIGH (ref 0.0–14.0)
NEUT#: 2.9 10*3/uL (ref 1.5–6.5)
NEUT%: 53 % (ref 39.0–75.0)
Platelets: 251 10*3/uL (ref 140–400)
RBC: 4 10*6/uL — AB (ref 4.20–5.82)
RDW: 14.2 % (ref 11.0–14.6)
WBC: 5.5 10*3/uL (ref 4.0–10.3)

## 2015-05-13 LAB — TSH CHCC: TSH: 1.622 m(IU)/L (ref 0.320–4.118)

## 2015-05-13 MED ORDER — SODIUM CHLORIDE 0.9 % IV SOLN
Freq: Once | INTRAVENOUS | Status: AC
  Administered 2015-05-13: 15:00:00 via INTRAVENOUS

## 2015-05-13 MED ORDER — HEPARIN SOD (PORK) LOCK FLUSH 100 UNIT/ML IV SOLN
500.0000 [IU] | Freq: Once | INTRAVENOUS | Status: AC | PRN
  Administered 2015-05-13: 500 [IU]
  Filled 2015-05-13: qty 5

## 2015-05-13 MED ORDER — SODIUM CHLORIDE 0.9 % IV SOLN
240.0000 mg | Freq: Once | INTRAVENOUS | Status: AC
  Administered 2015-05-13: 240 mg via INTRAVENOUS
  Filled 2015-05-13: qty 24

## 2015-05-13 MED ORDER — OXYCODONE-ACETAMINOPHEN 5-325 MG PO TABS: 1.0000 | ORAL_TABLET | Freq: Four times a day (QID) | ORAL | Status: DC | PRN

## 2015-05-13 MED ORDER — SODIUM CHLORIDE 0.9 % IJ SOLN
10.0000 mL | INTRAMUSCULAR | Status: DC | PRN
  Administered 2015-05-13: 10 mL
  Filled 2015-05-13: qty 10

## 2015-05-13 NOTE — Telephone Encounter (Signed)
Gave and printed appt sched and avs fo rpt for DEC...emailed MW tyo add tx

## 2015-05-13 NOTE — Progress Notes (Signed)
[place] NOTE  Patient Care Team: [name], MD as PCP - General (Infectious Diseases) Robert Bellow, MD (General Surgery) Leighton Ruff, MD as Consulting Physician (General Surgery) Irene Limbo, MD as Consulting Physician (Plastic Surgery) Truitt Merle, MD as Consulting Physician (Hematology) Festus Aloe, MD as Consulting Physician (Urology)  DIAGNOSIS Recurrent anal cancer    Primary cancer of anal canal ([place])   02/05/2011 Initial Diagnosis Primary cancer of anal canal, T1N0M0, s/p surgical resection with positive margines. He received definitive dose RT 45 Gy over 5 weeks with 16Gy boosting and concurrent chemo with 5FU, treated at [facility] by Drs.  Geoffreyh and [employer].    11/23/2013 Relapse/Recurrence local recurrence    04/21/2014 Surgery abdominal perianal resection (APR) and VRAM flap closure of perineal wound on 04/21/14, surgical margins were positive for cancer    06/28/2014 - 09/25/2014 Chemotherapy 5-FU 1000mg /m2/day on D1-5, ciplatin 100mg /m2 on D2, every 28 days, s/p 4 cycles    10/11/2014 Progression CT CAP showed new liver lesions and abd/pelc adenopathy, highly suspecious for progressive metastatic disease    11/02/2014 Imaging abdomen MRI w wo contrast showed new small lesions in the right hepatic lobe with nonspecific enhancement, and enlarging nodes in the porta hepatis, metastatic disease not excluded    02/24/2015 Imaging PET scan showed hypermetabolic bone mets in right sacrum, and small hypermetabolic retroperitoneal lymph nodes, no other abnormal metabolic activity within the liver or other place.   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   04/15/2015 -  Chemotherapy Nivolumab 240 mg every 2 weeks     OTHER RELATED ISSUES: 1. (+) HIV, on therapy with good control  2. CAD, s/p stent placement in [place]: Nivolumab 240mg  iv every 2 weeks, started  on 04/15/2015  INTERIM HISTORY: Julian Palmer returns for follow up and 3rd dose of nivolumab. He tolerated the first to your dictation very well, moderate fatigue for a few days, no other noticeable side effects.. He did have skin itchiness on Flomax a few days ago, no skin rash. His pain at the tailbone is still moderate, and he couldn't sit comfortably. He takes oxycodone as needed. Times a day. He denies any shooting pain into the legs, or weakness of his legs. He has not heard back from Dr. Sondra Come regarding his radiation.    MEDICAL HISTORY:  Past Medical History  Diagnosis Date  . S/p bare metal coronary artery stent     1998  pLAD  . Hypertension   . 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 Hyperlipidemia   . History of carcinoma in situ of anal canal     2010  . ED (erectile dysfunction)   . Sigmoid diverticulosis   . PONV (postoperative nausea and vomiting)   . Wears glasses   . At risk for sleep apnea     STOP-BANG= 4     SENT TO PCP 03-23-2014  . Coronary artery disease CARDIOLOGIST--  DR Rockey Situ --  LAST LOV 04-17-2013    S/P  PCI to mid and distal LAD and BM stent x1 to pLAD  . Anxiety   . Depression   . HIV positive ([place])   . 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  Primary cancer of anal canal ([place]) 05/18/2011  . Type 2 diabetes mellitus ([place])   . Radiation 01/2011    definitive dose RT 45 Gy over 5 weeks with 16Gy boosting    SURGICAL HISTORY: Past Surgical History  Procedure Laterality Date  . Septoplasty  1985  . Sphincterotomy  2009  . Cardiovascular stress test  04/ 2010    low risk study/  very small region of mildly reduced perfersion is mildly reversible dLAD territory  . Resection anal mass  02-05-2011  . Excision anal polyp  2009  . Re-excision anal squamous cell carcinoma   2010  . Removal rectal mass  01-08-2014  . Eua w/ bx perianal skin  06-08-2009  . Port-a-cath placement  02-21-2011    REMOVED  2013  . Coronary angioplasty with stent placement  1998   [facility]    BM stent to pLAD and PCI to Mid and Distal LAD/  no other significant cad  . Inguinal hernia repair Bilateral 1988  . Colonoscopy with propofol  05-28-2008  . Mass excision N/A 03/26/2014    Procedure: ANAL EXAM UNDER ANESTHESIA,EXCISIONAL BIOPSY OF ANAL MASS;  Surgeon: Leighton Ruff, MD;  Location: [facility];  Service: General;  Laterality: N/A;  . Laparoscopic assisted abdominal perineal resection N/A 04/22/2014    Procedure: LAPAROSCOPIC  ABDOMINAL PERINEAL RESECTION, ;  Surgeon: Leighton Ruff, MD;  Location: [facility];  Service: General;  Laterality: N/A;  . Urethrotomy Bilateral 04/22/2014    Procedure: CYSTOSCOPY/URETHROTOMY;  Surgeon: Leighton Ruff, MD;  Location: [facility];  Service: General;  Laterality: Bilateral;  . Muscle flap closure N/A 04/22/2014    Procedure: VRAM FLAP;  Surgeon: Irene Limbo, MD;  Location: [facility];  Service: Plastics;  Laterality: N/A;  . Portacath placement Left 06/24/2014    Procedure: INSERTION PORT-A-CATH LEFT SUBCLAVIAN;  Surgeon: Leighton Ruff, MD;  Location: [facility];  Service: General;  Laterality: Left;    SOCIAL HISTORY: Social History   Social History  . Marital Status: Single    Spouse Name: N/A  . Number of Children: 0  . Years of Education: N/A   Occupational History  .  [place] History Main Topics  . Smoking status: Never Smoker   . Smokeless tobacco: Never Used  . Alcohol Use: No     Comment: RARE  . Drug Use: No  . Sexual Activity: Not on file   Other Topics Concern  . Not on file   Social History Narrative   Full time. Single. Does not regularly exercise.     FAMILY HISTORY: Family History  Problem Relation Age of Onset  . Diabetes type II Mother   . Breast cancer Mother 23     survivor  .  Hypertension Mother   . Coronary artery disease Father   Paternal GM had utrine cancer, no other history of malignancy   ALLERGIES:  is allergic to ace inhibitors and sulfa antibiotics.  MEDICATIONS:  Current Outpatient Prescriptions  Medication Sig Dispense Refill  . aspirin (ASPIR-81) 81 MG EC tablet Take 81 mg by mouth at bedtime.     Marland Kitchen atorvastatin (LIPITOR) 80 MG tablet Take 1 tablet (80 mg total) by mouth every evening. 90 tablet 0  . citalopram (CELEXA) 40 MG tablet Take 40 mg by mouth every morning.    Marland Kitchen efavirenz-emtrictabine-tenofovir (ATRIPLA) 600-200-300 MG per tablet Take 1 tablet by mouth at bedtime.     . Empagliflozin-Linagliptin (GLYXAMBI) 25-5 MG TABS Take 1 tablet  by mouth daily.    Marland Kitchen glimepiride (AMARYL) 2 MG tablet Take 4 mg by mouth daily with breakfast.     . lidocaine-prilocaine (EMLA) cream Apply topically as needed.    Marland Kitchen losartan (COZAAR) 100 MG tablet TAKE 1 TABLET EVERY DAY (Patient taking differently: TAKE 1 TABLET EVERY DAY at bedtime) 90 tablet 3  . metFORMIN (GLUCOPHAGE) 1000 MG tablet Take 1,000 mg by mouth 2 (two) times daily.      . Multiple Vitamins-Minerals (MULTIVITAL) tablet Take 1 tablet by mouth every evening.     . niacin (NIASPAN) 1000 MG CR tablet Take 1 tablet (1,000 mg total) by mouth at bedtime. 90 tablet 3  . oxyCODONE-acetaminophen (PERCOCET/ROXICET) 5-325 MG tablet TAKE 1 TO 2 TABLETS BY MOUTH EVERY 4 HOURS AS NEEDED FOR PAIN . MAX 9/DAY  0  . traMADol (ULTRAM) 50 MG tablet Take 1 tablet (50 mg total) by mouth every 6 (six) hours as needed. 60 tablet 2   No current facility-administered medications for this visit.   Facility-Administered Medications Ordered in Other Visits  Medication Dose Route Frequency Provider Last Rate Last Dose  . heparin lock flush 100 unit/mL  500 Units Intravenous Once Truitt Merle, MD      . sodium chloride 0.9 % injection 10 mL  10 mL Intravenous PRN Truitt Merle, MD        REVIEW OF SYSTEMS:   Constitutional:  Denies fevers, chills or abnormal night sweats Eyes: Denies blurriness of vision, double vision or watery eyes Ears, nose, mouth, throat, and face: Denies mucositis or sore throat Respiratory: Denies cough, dyspnea or wheezes Cardiovascular: Denies palpitation, chest discomfort or lower extremity swelling Gastrointestinal:  Denies nausea, heartburn or change in bowel habits, (+) colostomy bag Skin: Mild skin pigmentation from his previous rash. Lymphatics: Denies new lymphadenopathy or easy bruising Neurological:Denies numbness, tingling or new weaknesses Behavioral/Psych: Mood is stable, no new changes  All other systems were reviewed with the patient and are negative.  PHYSICAL EXAMINATION: ECOG PERFORMANCE STATUS: 0  Filed Vitals:   05/13/15 1431  BP: 139/79  Pulse: 83  Temp: 97.8 F (36.6 C)  Resp: 20   Filed Weights   05/13/15 1431  Weight: 213 lb 8 oz (96.843 kg)    GENERAL:alert, no distress and comfortable SKIN: skin color, texture, turgor are normal, healed rashes on her arms, legs, and trunk with skin pigmentation.  EYES: normal, conjunctiva are pink and non-injected, sclera clear OROPHARYNX:no exudate, no erythema and lips, buccal mucosa, and tongue normal  NECK: supple, thyroid normal size, non-tender, without nodularity LYMPH:  no palpable lymphadenopathy in the cervical, axillary or inguinal LUNGS: clear to auscultation and percussion with normal breathing effort HEART: regular rate & rhythm and no murmurs and no lower extremity edema ABDOMEN:abdomen soft, non-tender and normal bowel sounds. (+) Colostomy bag. The perianal reconstruction site is completely healed.  Musculoskeletal:no cyanosis of digits and no clubbing  PSYCH: alert & oriented x 3 with fluent speech NEURO: no focal motor/sensory deficits SKIN: A few skin pigmentation from his previous rash  LABORATORY DATA:  I have reviewed the data as listed CBC Latest Ref Rng 04/29/2015 04/15/2015 03/15/2015   WBC 4.0 - 10.3 10e3/uL 5.1 5.7 5.9  Hemoglobin 13.0 - 17.1 g/dL 12.4(L) 13.1 12.8(L)  Hematocrit 38.4 - 49.9 % 36.7(L) 38.5 38.6(L)  Platelets 140 - 400 10e3/uL 263 262 226     Recent Labs  02/18/15 0812 04/15/15 0944 04/29/15 1348  NA 140 140 139  K 4.5 4.4  4.4  CO2 25 25 24   GLUCOSE 130 87 123  BUN 25.9 24.5 24.1  CREATININE 1.3 1.3 1.3  CALCIUM 9.8 10.1 9.5  PROT 8.1 8.6* 8.1  ALBUMIN 4.0 3.9 3.8  AST 25 44* 53*  ALT 21 38 51  ALKPHOS 85 125 135  BILITOT 0.21 <0.30 <0.30     PATHOLOGY REPORT 04/22/2014 Diagnosis 1. Soft tissue, biopsy, right pelvic sidewall; r/o malignancy - FIBROADIPOSE TISSUE, NO EVIDENCE OF MALIGNANCY. 2. Colon, segmental resection for tumor, rectum,sigmoid and anus - RECURRENT INVASIVE POORLY DIFFERENTIATED SQUAMOUS CELL CARCINOMA, INVADING THROUGH THE MUSCULARIS PROPRIA, INTO PERICOLONIC FATTY TISSUE, EXTENDING TO THE DEEP INKED MARGIN. - PERINEURAL INVASION AND ANGIOLYMPHATIC PRESENT. - SEVEN OF SEVENTEEN LYMPH NODES, POSITIVE FOR METASTATIC CARCINOMA (7/17) Microscopic Comment 2. Anus Specimen: Anus, rectum, sigmoid Procedure: Segmental resection Tumor site: Distal ends of specimen Specimen integrity: Intact Invasive tumor: Maximum size: At least 2.5 cm Histologic type(s): Invasive squamous cell carcinoma Histologic grade and differentiation: G2-3, moderately to poorly differentiated Microscopic extension of invasive tumor: Invading through the muscularis propria into pericolonic fatty tissue involving adjacent skeletal muscle tissue Lymph-Vascular invasion: Present Peri-neural invasion: Present Resection margins: Deep margin is positive for tumor Treatment effect (neo-adjuvant therapy): Present, minimally Lymph nodes: number examined 17; number positive: 7 Pathologic Staging: ypT2, yN1 (R1) Ancillary studies: N/A  Diagnosis 03/15/2015 Bone, biopsy, caudal left side of the sacrum - METASTATIC SQUAMOUS CELL CARCINOMA, SEE  COMMENT. Microscopic Comment Biopsies demonstrate extensive involvement by metastatic focally keratinizing, well to moderately differentiated squamous cell carcinoma. The previous anorectal resection demonstrating invasive squamous cell carcinoma is noted [id number]). (CRR:ecj 03/16/2015)  RADIOGRAPHIC STUDIES: I have personally reviewed the radiological images as listed and agreed with the findings in the report.  CT chest/abd/pelvis with contrast 10/11/2014 IMPRESSION: Interval development of new hypodense ill-defined hepatic lesions which, in the context of increased gastrohepatic, retroperitoneal, and pelvic sidewall lymphadenopathy, are highly suspicious for progressive metastatic disease.  No evidence for intrathoracic metastatic disease or acute abnormality.  Abdomen MRI w wo contrast 11/02/2014  IMPRESSION: 1. 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 2. The new smaller lesions within the right hepatic lobe are confirmed with nonspecific imaging/enhancement characteristics. Given the fact that these are new, and that there are enlarging lymph nodes within the porta hepatis, metastatic disease cannot be excluded. Correlation with tumor markers recommended. If metastatic disease remains uncertain clinically, further evaluation with PET-CT or short-term follow-up MRI could be performed. 3. Cholelithiasis without evidence of biliary dilatation.  Pet 02/24/2015  IMPRESSION: 1. Dominant finding is new hypermetabolic skeletal metastasis in the RIGHT sacrum. 2. Small hypermetabolic retroperitoneal periaortic and periportal lymph nodes consistent with nodal metastasis to retroperitoneum. 3. No abnormal metabolic activity within the liver to suggest metastasis. Lesions on comparison MRI were very small which could limit PET accuracy.  ASSESSMENT & PLAN:  54 years old male with past medical history of anal squamous cell carcinoma, status post surgical resection  and concurrent chemoradiation in 2009, now has local recurrence status post APR in the reconstruction.ypT2, yN1 (R1), unfortunately his deep surgical margin was positive, and 7 out of 17 lymph nodes were positive also. No distant metastasis on the preop PET scan. His medical history is also complicated by diabetes, coronary artery disease status post stent placement, and HIV on treatment. Performance status 1.  1. Local recurrence of anal squamous cell carcinoma, with positive surgical margines, biopsy proven sacral bone mets on 03/15/2015 -His recent PET scan from 02/24/2015 showed hypermetabolic skeletal metastasis in  the right sacrum, and a few small hypermetabolic retroperitoneal lymph nodes, liver tissue was non-hypermetabolic, no other distant metastasis. -I discussed his sacral bone biopsy results, unfortunately he confirmed metastasis. -Patient does not want more chemotherapy. I discussed the clinical trial of immunotherapy new follow him at in metastatic and no cancer, the data was present in ASCO 2016. It is small study, a total of 37 patient's, showed partial response rate about 20% and overall disease control rate about 80%. -I have started him on the Nivo, which was offered by [facility], and our cancer center waves his infusion fee.  -Lab reviewed, adequate for treatment, his TSH is normal, he is tolerating well. We'll proceed with cycle 2 today.  2. Diabetes -controlled recently 3-continue metformin and glimepiride, follow up with PCP   3. HIV  -Well controlled. Continue his treatment . His recent CD4 count was normal.   4. Hypertension and coronary artery disease  -He will continue follow-up with his primary care physician and cardiologist. Continue medications.   5. Sacral pain -Secondary to his sacral bone metastasis -moderate, slightly worse lately, he is taking percocet. -I refilled his Percocet today. -He is agreeable with palliative radiation now. I sent a message to Dr. Sondra Come.    6. depression -improved since he started treatment lately.   Plan -cycle 3 Nivo today and continue every 2 weeks -I sent a message to Dr. Sondra Come to see if he can start him on palliative RT  -I'll see him back in 2 weeks   All questions were answered. The patient knows to call the clinic with any problems, questions or concerns.   I spent 25 minutes counseling the patient face to face. The total time spent in the appointment was 30 minutes and more than 50% was on counseling.     Truitt Merle, MD 05/13/2015 8:24 AM

## 2015-05-13 NOTE — Telephone Encounter (Signed)
Per staff message and POF I have scheduled appts. Advised scheduler of appts. JMW  

## 2015-05-13 NOTE — Patient Instructions (Signed)
Nivolumab injection What is this medicine? NIVOLUMAB (nye VOL ue mab) is a monoclonal antibody. It is used to treat melanoma, lung cancer, kidney cancer, and Hodgkin lymphoma. This medicine may be used for other purposes; ask your health care provider or pharmacist if you have questions. What should I tell my health care provider before I take this medicine? They need to know if you have any of these conditions: -diabetes -immune system problems -kidney disease -liver disease -lung disease -organ transplant -stomach or intestine problems -thyroid disease -an unusual or allergic reaction to nivolumab, other medicines, foods, dyes, or preservatives -pregnant or trying to get pregnant -breast-feeding How should I use this medicine? This medicine is for infusion into a vein. It is given by a health care professional in a hospital or clinic setting. A special MedGuide will be given to you before each treatment. Be sure to read this information carefully each time. Talk to your pediatrician regarding the use of this medicine in children. Special care may be needed. Overdosage: If you think you have taken too much of this medicine contact a poison control center or emergency room at once. NOTE: This medicine is only for you. Do not share this medicine with others. What if I miss a dose? It is important not to miss your dose. Call your doctor or health care professional if you are unable to keep an appointment. What may interact with this medicine? Interactions have not been studied. Give your health care provider a list of all the medicines, herbs, non-prescription drugs, or dietary supplements you use. Also tell them if you smoke, drink alcohol, or use illegal drugs. Some items may interact with your medicine. This list may not describe all possible interactions. Give your health care provider a list of all the medicines, herbs, non-prescription drugs, or dietary supplements you use. Also tell  them if you smoke, drink alcohol, or use illegal drugs. Some items may interact with your medicine. What should I watch for while using this medicine? This drug may make you feel generally unwell. Continue your course of treatment even though you feel ill unless your doctor tells you to stop. You may need blood work done while you are taking this medicine. Do not become pregnant while taking this medicine or for 5 months after stopping it. Women should inform their doctor if they wish to become pregnant or think they might be pregnant. There is a potential for serious side effects to an unborn child. Talk to your health care professional or pharmacist for more information. Do not breast-feed an infant while taking this medicine. What side effects may I notice from receiving this medicine? Side effects that you should report to your doctor or health care professional as soon as possible: -allergic reactions like skin rash, itching or hives, swelling of the face, lips, or tongue -black, tarry stools -blood in the urine -bloody or watery diarrhea -changes in vision -change in sex drive -changes in emotions or moods -chest pain -confusion -cough -decreased appetite -diarrhea -facial flushing -feeling faint or lightheaded -fever, chills -hair loss -hallucination, loss of contact with reality -headache -irritable -joint pain -loss of memory -muscle pain -muscle weakness -seizures -shortness of breath -signs and symptoms of high blood sugar such as dizziness; dry mouth; dry skin; fruity breath; nausea; stomach pain; increased hunger or thirst; increased urination -signs and symptoms of kidney injury like trouble passing urine or change in the amount of urine -signs and symptoms of liver injury like dark yellow or   brown urine; general ill feeling or flu-like symptoms; light-colored stools; loss of appetite; nausea; right upper belly pain; unusually weak or tired; yellowing of the eyes or  skin -stiff neck -swelling of the ankles, feet, hands -weight gain Side effects that usually do not require medical attention (report to your doctor or health care professional if they continue or are bothersome): -bone pain -constipation -tiredness -vomiting This list may not describe all possible side effects. Call your doctor for medical advice about side effects. You may report side effects to FDA at 1-800-FDA-1088. Where should I keep my medicine? This drug is given in a hospital or clinic and will not be stored at home. NOTE: This sheet is a summary. It may not cover all possible information. If you have questions about this medicine, talk to your doctor, pharmacist, or health care provider.    2016, Elsevier/Gold Standard. (2014-11-10 10:03:42)  

## 2015-05-27 ENCOUNTER — Telehealth: Payer: Self-pay | Admitting: Hematology

## 2015-05-27 ENCOUNTER — Ambulatory Visit (HOSPITAL_BASED_OUTPATIENT_CLINIC_OR_DEPARTMENT_OTHER): Payer: Managed Care, Other (non HMO) | Admitting: Hematology

## 2015-05-27 ENCOUNTER — Ambulatory Visit (HOSPITAL_BASED_OUTPATIENT_CLINIC_OR_DEPARTMENT_OTHER): Payer: Managed Care, Other (non HMO)

## 2015-05-27 ENCOUNTER — Telehealth: Payer: Self-pay | Admitting: *Deleted

## 2015-05-27 ENCOUNTER — Other Ambulatory Visit (HOSPITAL_BASED_OUTPATIENT_CLINIC_OR_DEPARTMENT_OTHER): Payer: Managed Care, Other (non HMO)

## 2015-05-27 ENCOUNTER — Encounter: Payer: Self-pay | Admitting: Hematology

## 2015-05-27 ENCOUNTER — Ambulatory Visit: Payer: Managed Care, Other (non HMO)

## 2015-05-27 VITALS — BP 136/82 | HR 88 | Temp 97.4°F | Resp 18 | Ht 70.0 in | Wt 212.0 lb

## 2015-05-27 DIAGNOSIS — B20 Human immunodeficiency virus [HIV] disease: Secondary | ICD-10-CM | POA: Diagnosis not present

## 2015-05-27 DIAGNOSIS — Z21 Asymptomatic human immunodeficiency virus [HIV] infection status: Secondary | ICD-10-CM

## 2015-05-27 DIAGNOSIS — C7951 Secondary malignant neoplasm of bone: Secondary | ICD-10-CM | POA: Diagnosis not present

## 2015-05-27 DIAGNOSIS — Z5112 Encounter for antineoplastic immunotherapy: Secondary | ICD-10-CM

## 2015-05-27 DIAGNOSIS — E119 Type 2 diabetes mellitus without complications: Secondary | ICD-10-CM

## 2015-05-27 DIAGNOSIS — C211 Malignant neoplasm of anal canal: Secondary | ICD-10-CM

## 2015-05-27 DIAGNOSIS — G893 Neoplasm related pain (acute) (chronic): Secondary | ICD-10-CM

## 2015-05-27 DIAGNOSIS — Z452 Encounter for adjustment and management of vascular access device: Secondary | ICD-10-CM

## 2015-05-27 DIAGNOSIS — F329 Major depressive disorder, single episode, unspecified: Secondary | ICD-10-CM

## 2015-05-27 DIAGNOSIS — F32A Depression, unspecified: Secondary | ICD-10-CM

## 2015-05-27 DIAGNOSIS — Z95828 Presence of other vascular implants and grafts: Secondary | ICD-10-CM

## 2015-05-27 DIAGNOSIS — Z85048 Personal history of other malignant neoplasm of rectum, rectosigmoid junction, and anus: Secondary | ICD-10-CM

## 2015-05-27 LAB — CBC WITH DIFFERENTIAL/PLATELET
BASO%: 0.8 % (ref 0.0–2.0)
Basophils Absolute: 0 10*3/uL (ref 0.0–0.1)
EOS ABS: 0.2 10*3/uL (ref 0.0–0.5)
EOS%: 3.1 % (ref 0.0–7.0)
HCT: 36.5 % — ABNORMAL LOW (ref 38.4–49.9)
HGB: 12.2 g/dL — ABNORMAL LOW (ref 13.0–17.1)
LYMPH%: 24.4 % (ref 14.0–49.0)
MCH: 30.2 pg (ref 27.2–33.4)
MCHC: 33.6 g/dL (ref 32.0–36.0)
MCV: 90 fL (ref 79.3–98.0)
MONO#: 0.9 10*3/uL (ref 0.1–0.9)
MONO%: 16.1 % — ABNORMAL HIGH (ref 0.0–14.0)
NEUT#: 3 10*3/uL (ref 1.5–6.5)
NEUT%: 55.6 % (ref 39.0–75.0)
PLATELETS: 242 10*3/uL (ref 140–400)
RBC: 4.06 10*6/uL — ABNORMAL LOW (ref 4.20–5.82)
RDW: 14.3 % (ref 11.0–14.6)
WBC: 5.5 10*3/uL (ref 4.0–10.3)
lymph#: 1.3 10*3/uL (ref 0.9–3.3)

## 2015-05-27 LAB — COMPREHENSIVE METABOLIC PANEL
ALBUMIN: 3.8 g/dL (ref 3.5–5.0)
ALK PHOS: 121 U/L (ref 40–150)
ALT: 25 U/L (ref 0–55)
AST: 24 U/L (ref 5–34)
Anion Gap: 12 mEq/L — ABNORMAL HIGH (ref 3–11)
BUN: 24.2 mg/dL (ref 7.0–26.0)
CALCIUM: 9.3 mg/dL (ref 8.4–10.4)
CO2: 24 mEq/L (ref 22–29)
Chloride: 101 mEq/L (ref 98–109)
Creatinine: 1.5 mg/dL — ABNORMAL HIGH (ref 0.7–1.3)
EGFR: 50 mL/min/{1.73_m2} — AB (ref >90)
Glucose: 143 mg/dl — ABNORMAL HIGH (ref 70–140)
POTASSIUM: 4 meq/L (ref 3.5–5.1)
Sodium: 137 mEq/L (ref 136–145)
TOTAL PROTEIN: 8.3 g/dL (ref 6.4–8.3)

## 2015-05-27 MED ORDER — SODIUM CHLORIDE 0.9 % IJ SOLN
10.0000 mL | INTRAMUSCULAR | Status: DC | PRN
  Administered 2015-05-27: 10 mL
  Filled 2015-05-27: qty 10

## 2015-05-27 MED ORDER — SODIUM CHLORIDE 0.9 % IV SOLN
240.0000 mg | Freq: Once | INTRAVENOUS | Status: AC
  Administered 2015-05-27: 240 mg via INTRAVENOUS
  Filled 2015-05-27: qty 8

## 2015-05-27 MED ORDER — SODIUM CHLORIDE 0.9 % IJ SOLN
10.0000 mL | INTRAMUSCULAR | Status: DC | PRN
  Administered 2015-05-27: 10 mL via INTRAVENOUS
  Filled 2015-05-27: qty 10

## 2015-05-27 MED ORDER — ALTEPLASE 2 MG IJ SOLR
2.0000 mg | Freq: Once | INTRAMUSCULAR | Status: AC | PRN
  Administered 2015-05-27: 2 mg
  Filled 2015-05-27: qty 2

## 2015-05-27 MED ORDER — HEPARIN SOD (PORK) LOCK FLUSH 100 UNIT/ML IV SOLN
500.0000 [IU] | Freq: Once | INTRAVENOUS | Status: AC | PRN
  Administered 2015-05-27: 500 [IU]
  Filled 2015-05-27: qty 5

## 2015-05-27 MED ORDER — SODIUM CHLORIDE 0.9 % IV SOLN
Freq: Once | INTRAVENOUS | Status: AC
  Administered 2015-05-27: 16:00:00 via INTRAVENOUS

## 2015-05-27 NOTE — Progress Notes (Signed)
[place] NOTE  Patient Care Team: [name], MD as PCP - General (Infectious Diseases) Robert Bellow, MD (General Surgery) Leighton Ruff, MD as Consulting Physician (General Surgery) Irene Limbo, MD as Consulting Physician (Plastic Surgery) Truitt Merle, MD as Consulting Physician (Hematology) Festus Aloe, MD as Consulting Physician (Urology)  DIAGNOSIS Recurrent anal cancer    Primary cancer of anal canal ([place])   02/05/2011 Initial Diagnosis Primary cancer of anal canal, T1N0M0, s/p surgical resection with positive margines. He received definitive dose RT 45 Gy over 5 weeks with 16Gy boosting and concurrent chemo with 5FU, treated at [facility] by Drs.  Geoffreyh and [employer].    11/23/2013 Relapse/Recurrence local recurrence    04/21/2014 Surgery abdominal perianal resection (APR) and VRAM flap closure of perineal wound on 04/21/14, surgical margins were positive for cancer    06/28/2014 - 09/25/2014 Chemotherapy 5-FU 1000mg /m2/day on D1-5, ciplatin 100mg /m2 on D2, every 28 days, s/p 4 cycles    10/11/2014 Progression CT CAP showed new liver lesions and abd/pelc adenopathy, highly suspecious for progressive metastatic disease    11/02/2014 Imaging abdomen MRI w wo contrast showed new small lesions in the right hepatic lobe with nonspecific enhancement, and enlarging nodes in the porta hepatis, metastatic disease not excluded    02/24/2015 Imaging PET scan showed hypermetabolic bone mets in right sacrum, and small hypermetabolic retroperitoneal lymph nodes, no other abnormal metabolic activity within the liver or other place.   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   04/15/2015 -  Chemotherapy Nivolumab 240 mg every 2 weeks     OTHER RELATED ISSUES: 1. (+) HIV, on therapy with good control  2. CAD, s/p stent placement in [place]: Nivolumab 240mg  iv every 2 weeks, started  on 04/15/2015  INTERIM HISTORY: Dazon returns for follow up and 4th dose of nivolumab. He has been tolerating the treatment very well. Only noticeable side effects is of fatigue today Treatment. He recovers very well quickly. No skin rash or other side effects. He recently noticed his sacral pain has improved, he is taking less pain medication. He is able to sit for longer time. He looks more comfortable than before. He has no other complaints. His weight is stable, appetite and energy level are decent.  MEDICAL HISTORY:  Past Medical History  Diagnosis Date  . S/p bare metal coronary artery stent     1998  pLAD  . Hypertension   . 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 Hyperlipidemia   . History of carcinoma in situ of anal canal     2010  . ED (erectile dysfunction)   . Sigmoid diverticulosis   . PONV (postoperative nausea and vomiting)   . Wears glasses   . At risk for sleep apnea     STOP-BANG= 4     SENT TO PCP 03-23-2014  . Coronary artery disease CARDIOLOGIST--  DR Rockey Situ --  LAST LOV 04-17-2013    S/P  PCI to mid and distal LAD and BM stent x1 to pLAD  . Anxiety   . Depression   . HIV positive ([place])   . 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 Primary cancer of anal canal ([place]) 05/18/2011  .  Type 2 diabetes mellitus ([place])   . Radiation 01/2011    definitive dose RT 45 Gy over 5 weeks with 16Gy boosting    SURGICAL HISTORY: Past Surgical History  Procedure Laterality Date  . Septoplasty  1985  . Sphincterotomy  2009  . Cardiovascular stress test  04/ 2010    low risk study/  very small region of mildly reduced perfersion is mildly reversible dLAD territory  . Resection anal mass  02-05-2011  . Excision anal polyp  2009  . Re-excision anal squamous cell carcinoma  2010  . Removal  rectal mass  01-08-2014  . Eua w/ bx perianal skin  06-08-2009  . Port-a-cath placement  02-21-2011    REMOVED  2013  . Coronary angioplasty with stent placement  1998   [facility]    BM stent to pLAD and PCI to Mid and Distal LAD/  no other significant cad  . Inguinal hernia repair Bilateral 1988  . Colonoscopy with propofol  05-28-2008  . Mass excision N/A 03/26/2014    Procedure: ANAL EXAM UNDER ANESTHESIA,EXCISIONAL BIOPSY OF ANAL MASS;  Surgeon: Leighton Ruff, MD;  Location: [place];  Service: General;  Laterality: N/A;  . Laparoscopic assisted abdominal perineal resection N/A 04/22/2014    Procedure: LAPAROSCOPIC  ABDOMINAL PERINEAL RESECTION, ;  Surgeon: Leighton Ruff, MD;  Location: [facility];  Service: General;  Laterality: N/A;  . Urethrotomy Bilateral 04/22/2014    Procedure: CYSTOSCOPY/URETHROTOMY;  Surgeon: Leighton Ruff, MD;  Location: [facility];  Service: General;  Laterality: Bilateral;  . Muscle flap closure N/A 04/22/2014    Procedure: VRAM FLAP;  Surgeon: Irene Limbo, MD;  Location: [facility];  Service: Plastics;  Laterality: N/A;  . Portacath placement Left 06/24/2014    Procedure: INSERTION PORT-A-CATH LEFT SUBCLAVIAN;  Surgeon: Leighton Ruff, MD;  Location: [facility];  Service: General;  Laterality: Left;    SOCIAL HISTORY: Social History   Social History  . Marital Status: Single    Spouse Name: N/A  . Number of Children: 0  . Years of Education: N/A   Occupational History  .  [place] History Main Topics  . Smoking status: Never Smoker   . Smokeless tobacco: Never Used  . Alcohol Use: No     Comment: RARE  . Drug Use: No  . Sexual Activity: Not on file   Other Topics Concern  . Not on file   Social History Narrative   Full time. Single. Does not regularly exercise.     FAMILY HISTORY: Family History  Problem Relation Age of Onset  . Diabetes type II Mother   . Breast cancer Mother 2     survivor  . Hypertension  Mother   . Coronary artery disease Father   Paternal GM had utrine cancer, no other history of malignancy   ALLERGIES:  is allergic to ace inhibitors and sulfa antibiotics.  MEDICATIONS:  Current Outpatient Prescriptions  Medication Sig Dispense Refill  . aspirin (ASPIR-81) 81 MG EC tablet Take 81 mg by mouth at bedtime.     Marland Kitchen atorvastatin (LIPITOR) 80 MG tablet Take 1 tablet (80 mg total) by mouth every evening. 90 tablet 0  . citalopram (CELEXA) 40 MG tablet Take 40 mg by mouth every morning.    Marland Kitchen efavirenz-emtrictabine-tenofovir (ATRIPLA) 600-200-300 MG per tablet Take 1 tablet by mouth at bedtime.     . Empagliflozin-Linagliptin (GLYXAMBI) 25-5 MG TABS Take 1 tablet by mouth daily.    Marland Kitchen glimepiride (AMARYL)  2 MG tablet Take 4 mg by mouth daily with breakfast.     . lidocaine-prilocaine (EMLA) cream Apply topically as needed.    Marland Kitchen losartan (COZAAR) 100 MG tablet TAKE 1 TABLET EVERY DAY (Patient taking differently: TAKE 1 TABLET EVERY DAY at bedtime) 90 tablet 3  . metFORMIN (GLUCOPHAGE) 1000 MG tablet Take 1,000 mg by mouth 2 (two) times daily.      . Multiple Vitamins-Minerals (MULTIVITAL) tablet Take 1 tablet by mouth every evening.     . niacin (NIASPAN) 1000 MG CR tablet Take 1 tablet (1,000 mg total) by mouth at bedtime. 90 tablet 3  . oxyCODONE-acetaminophen (PERCOCET/ROXICET) 5-325 MG tablet Take 1 tablet by mouth every 6 (six) hours as needed for moderate pain or severe pain. 30 tablet 0  . traMADol (ULTRAM) 50 MG tablet Take 1 tablet (50 mg total) by mouth every 6 (six) hours as needed. (Patient not taking: Reported on 05/13/2015) 60 tablet 2   No current facility-administered medications for this visit.   Facility-Administered Medications Ordered in Other Visits  Medication Dose Route Frequency Provider Last Rate Last Dose  . heparin lock flush 100 unit/mL  500 Units Intravenous Once Truitt Merle, MD      . sodium chloride 0.9 % injection 10 mL  10 mL Intravenous PRN Truitt Merle, MD         REVIEW OF SYSTEMS:   Constitutional: Denies fevers, chills or abnormal night sweats Eyes: Denies blurriness of vision, double vision or watery eyes Ears, nose, mouth, throat, and face: Denies mucositis or sore throat Respiratory: Denies cough, dyspnea or wheezes Cardiovascular: Denies palpitation, chest discomfort or lower extremity swelling Gastrointestinal:  Denies nausea, heartburn or change in bowel habits, (+) colostomy bag Skin: Mild skin pigmentation from his previous rash. Lymphatics: Denies new lymphadenopathy or easy bruising Neurological:Denies numbness, tingling or new weaknesses Behavioral/Psych: Mood is stable, no new changes  All other systems were reviewed with the patient and are negative.  PHYSICAL EXAMINATION: ECOG PERFORMANCE STATUS: 0  Filed Vitals:   05/27/15 1423  BP: 136/82  Pulse: 88  Temp: 97.4 F (36.3 C)  Resp: 18   Filed Weights   05/27/15 1423  Weight: 212 lb (96.163 kg)    GENERAL:alert, no distress and comfortable SKIN: skin color, texture, turgor are normal, healed rashes on her arms, legs, and trunk with skin pigmentation.  EYES: normal, conjunctiva are pink and non-injected, sclera clear OROPHARYNX:no exudate, no erythema and lips, buccal mucosa, and tongue normal  NECK: supple, thyroid normal size, non-tender, without nodularity LYMPH:  no palpable lymphadenopathy in the cervical, axillary or inguinal LUNGS: clear to auscultation and percussion with normal breathing effort HEART: regular rate & rhythm and no murmurs and no lower extremity edema ABDOMEN:abdomen soft, non-tender and normal bowel sounds. (+) Colostomy bag. The perianal reconstruction site is completely healed.  Musculoskeletal:no cyanosis of digits and no clubbing  PSYCH: alert & oriented x 3 with fluent speech NEURO: no focal motor/sensory deficits SKIN: A few skin pigmentation from his previous rash  LABORATORY DATA:  I have reviewed the data as listed CBC  Latest Ref Rng 05/27/2015 05/13/2015 04/29/2015  WBC 4.0 - 10.3 10e3/uL 5.5 5.5 5.1  Hemoglobin 13.0 - 17.1 g/dL 12.2(L) 12.1(L) 12.4(L)  Hematocrit 38.4 - 49.9 % 36.5(L) 36.1(L) 36.7(L)  Platelets 140 - 400 10e3/uL 242 251 263     Recent Labs  04/29/15 1348 05/13/15 1336 05/27/15 1318  NA 139 138 137  K 4.4 4.0 4.0  CO2 24  24 24  GLUCOSE 123 107 143*  BUN 24.1 25.9 24.2  CREATININE 1.3 1.3 1.5*  CALCIUM 9.5 9.1 9.3  PROT 8.1 8.1 8.3  ALBUMIN 3.8 3.8 3.8  AST 53* 32 24  ALT 51 27 25  ALKPHOS 135 114 121  BILITOT <0.30 <0.30 <0.30     PATHOLOGY REPORT 04/22/2014 Diagnosis 1. Soft tissue, biopsy, right pelvic sidewall; r/o malignancy - FIBROADIPOSE TISSUE, NO EVIDENCE OF MALIGNANCY. 2. Colon, segmental resection for tumor, rectum,sigmoid and anus - RECURRENT INVASIVE POORLY DIFFERENTIATED SQUAMOUS CELL CARCINOMA, INVADING THROUGH THE MUSCULARIS PROPRIA, INTO PERICOLONIC FATTY TISSUE, EXTENDING TO THE DEEP INKED MARGIN. - PERINEURAL INVASION AND ANGIOLYMPHATIC PRESENT. - SEVEN OF SEVENTEEN LYMPH NODES, POSITIVE FOR METASTATIC CARCINOMA (7/17) Microscopic Comment 2. Anus Specimen: Anus, rectum, sigmoid Procedure: Segmental resection Tumor site: Distal ends of specimen Specimen integrity: Intact Invasive tumor: Maximum size: At least 2.5 cm Histologic type(s): Invasive squamous cell carcinoma Histologic grade and differentiation: G2-3, moderately to poorly differentiated Microscopic extension of invasive tumor: Invading through the muscularis propria into pericolonic fatty tissue involving adjacent skeletal muscle tissue Lymph-Vascular invasion: Present Peri-neural invasion: Present Resection margins: Deep margin is positive for tumor Treatment effect (neo-adjuvant therapy): Present, minimally Lymph nodes: number examined 17; number positive: 7 Pathologic Staging: ypT2, yN1 (R1) Ancillary studies: N/A  Diagnosis 03/15/2015 Bone, biopsy, caudal left side of the  sacrum - METASTATIC SQUAMOUS CELL CARCINOMA, SEE COMMENT. Microscopic Comment Biopsies demonstrate extensive involvement by metastatic focally keratinizing, well to moderately differentiated squamous cell carcinoma. The previous anorectal resection demonstrating invasive squamous cell carcinoma is noted [id number]). (CRR:ecj 03/16/2015)  RADIOGRAPHIC STUDIES: I have personally reviewed the radiological images as listed and agreed with the findings in the report.  CT chest/abd/pelvis with contrast 10/11/2014 IMPRESSION: Interval development of new hypodense ill-defined hepatic lesions which, in the context of increased gastrohepatic, retroperitoneal, and pelvic sidewall lymphadenopathy, are highly suspicious for progressive metastatic disease.  No evidence for intrathoracic metastatic disease or acute abnormality.  Abdomen MRI w wo contrast 11/02/2014  IMPRESSION: 1. 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 2. The new smaller lesions within the right hepatic lobe are confirmed with nonspecific imaging/enhancement characteristics. Given the fact that these are new, and that there are enlarging lymph nodes within the porta hepatis, metastatic disease cannot be excluded. Correlation with tumor markers recommended. If metastatic disease remains uncertain clinically, further evaluation with PET-CT or short-term follow-up MRI could be performed. 3. Cholelithiasis without evidence of biliary dilatation.  Pet 02/24/2015  IMPRESSION: 1. Dominant finding is new hypermetabolic skeletal metastasis in the RIGHT sacrum. 2. Small hypermetabolic retroperitoneal periaortic and periportal lymph nodes consistent with nodal metastasis to retroperitoneum. 3. No abnormal metabolic activity within the liver to suggest metastasis. Lesions on comparison MRI were very small which could limit PET accuracy.  ASSESSMENT & PLAN:  54 years old male with past medical history of anal  squamous cell carcinoma, status post surgical resection and concurrent chemoradiation in 2009, now has local recurrence status post APR in the reconstruction.ypT2, yN1 (R1), unfortunately his deep surgical margin was positive, and 7 out of 17 lymph nodes were positive also. No distant metastasis on the preop PET scan. His medical history is also complicated by diabetes, coronary artery disease status post stent placement, and HIV on treatment. Performance status 1.  1. Local recurrence of anal squamous cell carcinoma, with positive surgical margines, biopsy proven sacral bone mets on 03/15/2015 -His recent PET scan from 02/24/2015 showed hypermetabolic skeletal metastasis in the right sacrum, and  a few small hypermetabolic retroperitoneal lymph nodes, liver tissue was non-hypermetabolic, no other distant metastasis. -I discussed his sacral bone biopsy results, unfortunately he confirmed metastasis. -Patient does not want more chemotherapy. I discussed the clinical trial of immunotherapy new follow him at in metastatic and no cancer, the data was present in ASCO 2016. It is small study, a total of 37 patient's, showed partial response rate about 20% and overall disease control rate about 80%. -I have started him on the Nivo, which was offered by [facility], and our cancer center waves his infusion fee.  -Lab reviewed, adequate for treatment, his TSH is normal, he is tolerating well. We'll proceed with cycle 4 today. -his sacral pain seems improved lately, which is a good clinical sign that he may be responding to treatment.  2. Diabetes -controlled recently  -continue metformin and glimepiride, follow up with PCP   3. HIV  -Well controlled. Continue his treatment . His recent CD4 count was normal.   4. Hypertension and coronary artery disease  -He will continue follow-up with his primary care physician and cardiologist. Continue medications.   5. Sacral pain -Secondary to his sacral bone  metastasis -moderate, improved lately, he is taking percocet. -if his pain continued to improve, he may not need palliative radiation  6. depression -improved since he started treatment lately.   Plan -cycle 4 Nivo today and continue every 2 weeks -I'll see him back in 6 weeks -repeat PET scan in early Jan    All questions were answered. The patient knows to call the clinic with any problems, questions or concerns.   I spent 25 minutes counseling the patient face to face. The total time spent in the appointment was 30 minutes and more than 50% was on counseling.     Truitt Merle, MD 05/27/2015 8:29 AM

## 2015-05-27 NOTE — Progress Notes (Signed)
Upon arrival to infusion room, pt informed myself that PAC not drawing blood.  Many maneuvers attempted to obtain blood return but no response.  Cathflo instilled at 1610.  Will continue to monitor.

## 2015-05-27 NOTE — Telephone Encounter (Signed)
Per staff message and POF I have scheduled appts. Advised scheduler of appts. JMW  

## 2015-05-27 NOTE — Progress Notes (Signed)
Cathflo aspirated with swift blood return.  PAC saline/hep locked and deaccessed.

## 2015-05-27 NOTE — Progress Notes (Signed)
PAC accessed for lab draw, approx 5cc of blood return noted for waste. Blood return was hard to obtain after the 5cc, giving resistance. Multiple maneuvers attempted with no luck. Pt is having labs drawn peripherally, called infusion to inform them of blood return.

## 2015-05-27 NOTE — Patient Instructions (Signed)

## 2015-05-27 NOTE — Telephone Encounter (Signed)
per pof to sch pt appt-gave pt copy of avs-sent MW email to sch trmt-pt to get updated copy of avs °

## 2015-05-27 NOTE — Patient Instructions (Signed)
[facility] Discharge Instructions for Patients Receiving Chemotherapy  Today you received the following chemotherapy agents Opdivo.  To help prevent nausea and vomiting after your treatment, we encourage you to take your nausea medication as directed.   If you develop nausea and vomiting that is not controlled by your nausea medication, call the clinic.   BELOW ARE SYMPTOMS THAT SHOULD BE REPORTED IMMEDIATELY:  *FEVER GREATER THAN 100.5 F  *CHILLS WITH OR WITHOUT FEVER  NAUSEA AND VOMITING THAT IS NOT CONTROLLED WITH YOUR NAUSEA MEDICATION  *UNUSUAL SHORTNESS OF BREATH  *UNUSUAL BRUISING OR BLEEDING  TENDERNESS IN MOUTH AND THROAT WITH OR WITHOUT PRESENCE OF ULCERS  *URINARY PROBLEMS  *BOWEL PROBLEMS  UNUSUAL RASH Items with * indicate a potential emergency and should be followed up as soon as possible.  Feel free to call the clinic you have any questions or concerns. The clinic phone number is [phone].  Please show the CHEMO ALERT CARD at check-in to the Emergency Department and triage nurse.    

## 2015-06-10 ENCOUNTER — Other Ambulatory Visit: Payer: Managed Care, Other (non HMO)

## 2015-06-10 ENCOUNTER — Encounter: Payer: Self-pay | Admitting: *Deleted

## 2015-06-10 ENCOUNTER — Ambulatory Visit: Payer: Managed Care, Other (non HMO)

## 2015-06-10 ENCOUNTER — Ambulatory Visit: Payer: Managed Care, Other (non HMO) | Admitting: Nurse Practitioner

## 2015-06-10 ENCOUNTER — Ambulatory Visit (HOSPITAL_BASED_OUTPATIENT_CLINIC_OR_DEPARTMENT_OTHER): Payer: Managed Care, Other (non HMO)

## 2015-06-10 ENCOUNTER — Other Ambulatory Visit (HOSPITAL_BASED_OUTPATIENT_CLINIC_OR_DEPARTMENT_OTHER): Payer: Managed Care, Other (non HMO)

## 2015-06-10 VITALS — BP 123/74 | HR 88 | Temp 98.4°F | Resp 18

## 2015-06-10 DIAGNOSIS — R5383 Other fatigue: Secondary | ICD-10-CM | POA: Diagnosis not present

## 2015-06-10 DIAGNOSIS — Z5112 Encounter for antineoplastic immunotherapy: Secondary | ICD-10-CM

## 2015-06-10 DIAGNOSIS — C7951 Secondary malignant neoplasm of bone: Secondary | ICD-10-CM

## 2015-06-10 DIAGNOSIS — Z95828 Presence of other vascular implants and grafts: Secondary | ICD-10-CM

## 2015-06-10 DIAGNOSIS — C211 Malignant neoplasm of anal canal: Secondary | ICD-10-CM

## 2015-06-10 DIAGNOSIS — Z85048 Personal history of other malignant neoplasm of rectum, rectosigmoid junction, and anus: Secondary | ICD-10-CM

## 2015-06-10 LAB — COMPREHENSIVE METABOLIC PANEL
ALK PHOS: 120 U/L (ref 40–150)
ALT: 27 U/L (ref 0–55)
ANION GAP: 11 meq/L (ref 3–11)
AST: 26 U/L (ref 5–34)
Albumin: 3.7 g/dL (ref 3.5–5.0)
BUN: 24 mg/dL (ref 7.0–26.0)
CO2: 24 meq/L (ref 22–29)
Calcium: 8.9 mg/dL (ref 8.4–10.4)
Chloride: 103 mEq/L (ref 98–109)
Creatinine: 1.3 mg/dL (ref 0.7–1.3)
EGFR: 60 mL/min/{1.73_m2} — AB (ref >90)
Glucose: 153 mg/dl — ABNORMAL HIGH (ref 70–140)
POTASSIUM: 4.1 meq/L (ref 3.5–5.1)
Sodium: 138 mEq/L (ref 136–145)
TOTAL PROTEIN: 8.1 g/dL (ref 6.4–8.3)

## 2015-06-10 LAB — CBC WITH DIFFERENTIAL/PLATELET
BASO%: 1 % (ref 0.0–2.0)
BASOS ABS: 0.1 10*3/uL (ref 0.0–0.1)
EOS ABS: 0.2 10*3/uL (ref 0.0–0.5)
EOS%: 3.8 % (ref 0.0–7.0)
HCT: 36.4 % — ABNORMAL LOW (ref 38.4–49.9)
HEMOGLOBIN: 12 g/dL — AB (ref 13.0–17.1)
LYMPH%: 27.2 % (ref 14.0–49.0)
MCH: 29.6 pg (ref 27.2–33.4)
MCHC: 32.8 g/dL (ref 32.0–36.0)
MCV: 90.1 fL (ref 79.3–98.0)
MONO#: 0.8 10*3/uL (ref 0.1–0.9)
MONO%: 13.3 % (ref 0.0–14.0)
NEUT#: 3.4 10*3/uL (ref 1.5–6.5)
NEUT%: 54.7 % (ref 39.0–75.0)
Platelets: 253 10*3/uL (ref 140–400)
RBC: 4.04 10*6/uL — ABNORMAL LOW (ref 4.20–5.82)
RDW: 14.3 % (ref 11.0–14.6)
WBC: 6.2 10*3/uL (ref 4.0–10.3)
lymph#: 1.7 10*3/uL (ref 0.9–3.3)

## 2015-06-10 LAB — TSH: TSH: 1.6 m[IU]/L (ref 0.320–4.118)

## 2015-06-10 MED ORDER — HEPARIN SOD (PORK) LOCK FLUSH 100 UNIT/ML IV SOLN
500.0000 [IU] | Freq: Once | INTRAVENOUS | Status: AC | PRN
  Administered 2015-06-10: 500 [IU]
  Filled 2015-06-10: qty 5

## 2015-06-10 MED ORDER — SODIUM CHLORIDE 0.9 % IJ SOLN
10.0000 mL | INTRAMUSCULAR | Status: DC | PRN
  Administered 2015-06-10: 10 mL
  Filled 2015-06-10: qty 10

## 2015-06-10 MED ORDER — SODIUM CHLORIDE 0.9 % IV SOLN
Freq: Once | INTRAVENOUS | Status: AC
  Administered 2015-06-10: 15:00:00 via INTRAVENOUS

## 2015-06-10 MED ORDER — SODIUM CHLORIDE 0.9 % IV SOLN
240.0000 mg | Freq: Once | INTRAVENOUS | Status: AC
  Administered 2015-06-10: 240 mg via INTRAVENOUS
  Filled 2015-06-10: qty 8

## 2015-06-10 MED ORDER — SODIUM CHLORIDE 0.9 % IJ SOLN
10.0000 mL | INTRAMUSCULAR | Status: DC | PRN
  Administered 2015-06-10: 10 mL via INTRAVENOUS
  Filled 2015-06-10: qty 10

## 2015-06-10 NOTE — Progress Notes (Signed)
  Oncology Nurse Navigator Documentation    Navigator Encounter Type: Treatment;6 month (06/10/15 1602) Patient Visit Type: Medonc (06/10/15 1602) Treatment Phase: Treatment (06/10/15 1602) Barriers/Navigation Needs: No barriers at this time (06/10/15 1602)   Interventions: None required (06/10/15 1602)-Has his colostomy trained now--irrigates at 7pm each night and is good for the day. Working is much easier now.            Time Spent with Patient: 5(06/10/15 1602)

## 2015-06-10 NOTE — Patient Instructions (Signed)
[facility] Discharge Instructions for Patients Receiving Chemotherapy  Today you received the following chemotherapy agents:  Nivolumab.  To help prevent nausea and vomiting after your treatment, we encourage you to take your nausea medication as directed.   If you develop nausea and vomiting that is not controlled by your nausea medication, call the clinic.   BELOW ARE SYMPTOMS THAT SHOULD BE REPORTED IMMEDIATELY:  *FEVER GREATER THAN 100.5 F  *CHILLS WITH OR WITHOUT FEVER  NAUSEA AND VOMITING THAT IS NOT CONTROLLED WITH YOUR NAUSEA MEDICATION  *UNUSUAL SHORTNESS OF BREATH  *UNUSUAL BRUISING OR BLEEDING  TENDERNESS IN MOUTH AND THROAT WITH OR WITHOUT PRESENCE OF ULCERS  *URINARY PROBLEMS  *BOWEL PROBLEMS  UNUSUAL RASH Items with * indicate a potential emergency and should be followed up as soon as possible.  Feel free to call the clinic you have any questions or concerns. The clinic phone number is [phone].  Please show the CHEMO ALERT CARD at check-in to the Emergency Department and triage nurse.   

## 2015-06-10 NOTE — Patient Instructions (Signed)

## 2015-06-22 ENCOUNTER — Ambulatory Visit: Payer: Managed Care, Other (non HMO) | Admitting: Cardiovascular Disease

## 2015-06-22 ENCOUNTER — Telehealth: Payer: Self-pay | Admitting: *Deleted

## 2015-06-22 NOTE — Telephone Encounter (Signed)
Pt c/o medication issue:  1. Name of Medication: 1000 mg NIASPAN    2. How are you currently taking this medication (dosage and times per day)? 1 x at night   3. Are you having a reaction (difficulty breathing--STAT)? No   4. What is your medication issue? Just would like to come off it thinks it is not doing anything. Would like to just cut down on pills. Fighting cancer and would like to just make it easier on him.

## 2015-06-22 NOTE — Telephone Encounter (Signed)
S/w pt who states he would like to d/c niacin as he doesn't feel he needs this medication.  Pt has cancer diagnosis, is currently undergoing treatment and would like to reduce number of pills he takes. No recent lipid labs in the system. Will forward to MD to advise.

## 2015-06-24 ENCOUNTER — Ambulatory Visit: Payer: Managed Care, Other (non HMO)

## 2015-06-24 ENCOUNTER — Other Ambulatory Visit: Payer: Self-pay

## 2015-06-24 ENCOUNTER — Ambulatory Visit: Payer: Managed Care, Other (non HMO) | Admitting: Nurse Practitioner

## 2015-06-24 ENCOUNTER — Other Ambulatory Visit (HOSPITAL_BASED_OUTPATIENT_CLINIC_OR_DEPARTMENT_OTHER): Payer: Managed Care, Other (non HMO)

## 2015-06-24 ENCOUNTER — Ambulatory Visit (HOSPITAL_BASED_OUTPATIENT_CLINIC_OR_DEPARTMENT_OTHER): Payer: Managed Care, Other (non HMO)

## 2015-06-24 ENCOUNTER — Other Ambulatory Visit: Payer: Managed Care, Other (non HMO)

## 2015-06-24 DIAGNOSIS — C211 Malignant neoplasm of anal canal: Secondary | ICD-10-CM

## 2015-06-24 DIAGNOSIS — Z5112 Encounter for antineoplastic immunotherapy: Secondary | ICD-10-CM | POA: Diagnosis not present

## 2015-06-24 DIAGNOSIS — Z95828 Presence of other vascular implants and grafts: Secondary | ICD-10-CM

## 2015-06-24 DIAGNOSIS — C7951 Secondary malignant neoplasm of bone: Secondary | ICD-10-CM | POA: Diagnosis not present

## 2015-06-24 DIAGNOSIS — Z85048 Personal history of other malignant neoplasm of rectum, rectosigmoid junction, and anus: Secondary | ICD-10-CM

## 2015-06-24 LAB — COMPREHENSIVE METABOLIC PANEL
ALT: 24 U/L (ref 0–55)
AST: 25 U/L (ref 5–34)
Albumin: 3.8 g/dL (ref 3.5–5.0)
Alkaline Phosphatase: 124 U/L (ref 40–150)
Anion Gap: 12 mEq/L — ABNORMAL HIGH (ref 3–11)
BUN: 22.2 mg/dL (ref 7.0–26.0)
CHLORIDE: 106 meq/L (ref 98–109)
CO2: 21 meq/L — AB (ref 22–29)
Calcium: 9.1 mg/dL (ref 8.4–10.4)
Creatinine: 1.3 mg/dL (ref 0.7–1.3)
EGFR: 61 mL/min/{1.73_m2} — ABNORMAL LOW (ref >90)
GLUCOSE: 133 mg/dL (ref 70–140)
POTASSIUM: 4.1 meq/L (ref 3.5–5.1)
SODIUM: 139 meq/L (ref 136–145)
Total Bilirubin: <0.3 mg/dL (ref 0.20–1.20)
Total Protein: 8.2 g/dL (ref 6.4–8.3)

## 2015-06-24 LAB — CBC WITH DIFFERENTIAL/PLATELET
BASO%: 0.9 % (ref 0.0–2.0)
BASOS ABS: 0.1 10*3/uL (ref 0.0–0.1)
EOS%: 2.9 % (ref 0.0–7.0)
Eosinophils Absolute: 0.2 10*3/uL (ref 0.0–0.5)
HCT: 37.6 % — ABNORMAL LOW (ref 38.4–49.9)
HGB: 12.4 g/dL — ABNORMAL LOW (ref 13.0–17.1)
LYMPH%: 27.9 % (ref 14.0–49.0)
MCH: 29.7 pg (ref 27.2–33.4)
MCHC: 32.9 g/dL (ref 32.0–36.0)
MCV: 90.2 fL (ref 79.3–98.0)
MONO#: 0.8 10*3/uL (ref 0.1–0.9)
MONO%: 14.2 % — AB (ref 0.0–14.0)
NEUT#: 3.2 10*3/uL (ref 1.5–6.5)
NEUT%: 54.1 % (ref 39.0–75.0)
Platelets: 240 10*3/uL (ref 140–400)
RBC: 4.17 10*6/uL — ABNORMAL LOW (ref 4.20–5.82)
RDW: 14 % (ref 11.0–14.6)
WBC: 6 10*3/uL (ref 4.0–10.3)
lymph#: 1.7 10*3/uL (ref 0.9–3.3)

## 2015-06-24 MED ORDER — SODIUM CHLORIDE 0.9 % IJ SOLN
10.0000 mL | INTRAMUSCULAR | Status: DC | PRN
  Filled 2015-06-24: qty 10

## 2015-06-24 MED ORDER — HEPARIN SOD (PORK) LOCK FLUSH 100 UNIT/ML IV SOLN
500.0000 [IU] | Freq: Once | INTRAVENOUS | Status: DC | PRN
Start: 2015-06-24 — End: 2015-10-16
  Filled 2015-06-24: qty 5

## 2015-06-24 MED ORDER — SODIUM CHLORIDE 0.9 % IV SOLN
Freq: Once | INTRAVENOUS | Status: AC
  Administered 2015-06-24: 15:00:00 via INTRAVENOUS

## 2015-06-24 MED ORDER — SODIUM CHLORIDE 0.9 % IJ SOLN
10.0000 mL | INTRAMUSCULAR | Status: DC | PRN
  Administered 2015-06-24: 10 mL via INTRAVENOUS
  Filled 2015-06-24: qty 10

## 2015-06-24 MED ORDER — NIVOLUMAB CHEMO INJECTION 100 MG/10ML
240.0000 mg | Freq: Once | INTRAVENOUS | Status: AC
  Administered 2015-06-24: 240 mg via INTRAVENOUS
  Filled 2015-06-24: qty 24

## 2015-06-24 NOTE — Patient Instructions (Signed)
[facility] Discharge Instructions for Patients Receiving Chemotherapy  Today you received the following chemotherapy agents:  Nivolumab.  To help prevent nausea and vomiting after your treatment, we encourage you to take your nausea medication as directed.   If you develop nausea and vomiting that is not controlled by your nausea medication, call the clinic.   BELOW ARE SYMPTOMS THAT SHOULD BE REPORTED IMMEDIATELY:  *FEVER GREATER THAN 100.5 F  *CHILLS WITH OR WITHOUT FEVER  NAUSEA AND VOMITING THAT IS NOT CONTROLLED WITH YOUR NAUSEA MEDICATION  *UNUSUAL SHORTNESS OF BREATH  *UNUSUAL BRUISING OR BLEEDING  TENDERNESS IN MOUTH AND THROAT WITH OR WITHOUT PRESENCE OF ULCERS  *URINARY PROBLEMS  *BOWEL PROBLEMS  UNUSUAL RASH Items with * indicate a potential emergency and should be followed up as soon as possible.  Feel free to call the clinic you have any questions or concerns. The clinic phone number is [phone].  Please show the CHEMO ALERT CARD at check-in to the Emergency Department and triage nurse.   

## 2015-06-24 NOTE — Patient Instructions (Signed)

## 2015-06-24 NOTE — Addendum Note (Signed)
Addended by: Lucile Crater on: 06/24/2015 04:24 PM   Modules accepted: Orders

## 2015-06-28 NOTE — Telephone Encounter (Signed)
Spoke w/ pt. Advised him of Dr. Donivan Scull recommendation.  He is appreciative of the call and glad that he doesn't have to take such a big pill anymore.

## 2015-06-28 NOTE — Telephone Encounter (Signed)
Okay to stop Niaspan

## 2015-06-29 ENCOUNTER — Encounter: Payer: Self-pay | Admitting: *Deleted

## 2015-07-06 ENCOUNTER — Ambulatory Visit
Admission: RE | Admit: 2015-07-06 | Discharge: 2015-07-06 | Disposition: A | Discharge status: Home / Self Care | Payer: Managed Care, Other (non HMO) | Source: Ambulatory Visit | Attending: Hematology | Admitting: Hematology

## 2015-07-06 DIAGNOSIS — C211 Malignant neoplasm of anal canal: Secondary | ICD-10-CM

## 2015-07-07 ENCOUNTER — Ambulatory Visit
Admission: RE | Admit: 2015-07-07 | Discharge: 2015-07-07 | Disposition: A | Discharge status: Home / Self Care | Payer: Managed Care, Other (non HMO) | Source: Ambulatory Visit | Attending: Hematology | Admitting: Hematology

## 2015-07-07 DIAGNOSIS — R911 Solitary pulmonary nodule: Secondary | ICD-10-CM | POA: Insufficient documentation

## 2015-07-07 DIAGNOSIS — C211 Malignant neoplasm of anal canal: Secondary | ICD-10-CM | POA: Insufficient documentation

## 2015-07-07 DIAGNOSIS — I2510 Atherosclerotic heart disease of native coronary artery without angina pectoris: Secondary | ICD-10-CM | POA: Insufficient documentation

## 2015-07-07 DIAGNOSIS — I700 Atherosclerosis of aorta: Secondary | ICD-10-CM | POA: Insufficient documentation

## 2015-07-07 DIAGNOSIS — C772 Secondary and unspecified malignant neoplasm of intra-abdominal lymph nodes: Secondary | ICD-10-CM | POA: Insufficient documentation

## 2015-07-07 DIAGNOSIS — C7951 Secondary malignant neoplasm of bone: Secondary | ICD-10-CM | POA: Diagnosis not present

## 2015-07-07 DIAGNOSIS — K8020 Calculus of gallbladder without cholecystitis without obstruction: Secondary | ICD-10-CM | POA: Diagnosis not present

## 2015-07-07 LAB — GLUCOSE, CAPILLARY: GLUCOSE-CAPILLARY: 98 mg/dL (ref 65–99)

## 2015-07-07 MED ORDER — FLUDEOXYGLUCOSE F - 18 (FDG) INJECTION
13.2900 | Freq: Once | INTRAVENOUS | Status: AC | PRN
  Administered 2015-07-07: 13.29 via INTRAVENOUS

## 2015-07-08 ENCOUNTER — Ambulatory Visit (HOSPITAL_BASED_OUTPATIENT_CLINIC_OR_DEPARTMENT_OTHER): Payer: Managed Care, Other (non HMO)

## 2015-07-08 ENCOUNTER — Encounter: Payer: Self-pay | Admitting: Hematology

## 2015-07-08 ENCOUNTER — Ambulatory Visit (HOSPITAL_BASED_OUTPATIENT_CLINIC_OR_DEPARTMENT_OTHER): Payer: Managed Care, Other (non HMO) | Admitting: Hematology

## 2015-07-08 ENCOUNTER — Other Ambulatory Visit (HOSPITAL_BASED_OUTPATIENT_CLINIC_OR_DEPARTMENT_OTHER): Payer: Managed Care, Other (non HMO)

## 2015-07-08 ENCOUNTER — Telehealth: Payer: Self-pay | Admitting: Hematology

## 2015-07-08 VITALS — BP 120/86 | HR 79 | Temp 98.5°F | Resp 18 | Ht 70.0 in | Wt 208.5 lb

## 2015-07-08 DIAGNOSIS — C211 Malignant neoplasm of anal canal: Secondary | ICD-10-CM

## 2015-07-08 DIAGNOSIS — C7951 Secondary malignant neoplasm of bone: Secondary | ICD-10-CM | POA: Diagnosis not present

## 2015-07-08 DIAGNOSIS — Z79899 Other long term (current) drug therapy: Secondary | ICD-10-CM | POA: Diagnosis not present

## 2015-07-08 DIAGNOSIS — Z85048 Personal history of other malignant neoplasm of rectum, rectosigmoid junction, and anus: Secondary | ICD-10-CM

## 2015-07-08 DIAGNOSIS — E119 Type 2 diabetes mellitus without complications: Secondary | ICD-10-CM

## 2015-07-08 DIAGNOSIS — B20 Human immunodeficiency virus [HIV] disease: Secondary | ICD-10-CM | POA: Diagnosis not present

## 2015-07-08 DIAGNOSIS — Z5112 Encounter for antineoplastic immunotherapy: Secondary | ICD-10-CM

## 2015-07-08 DIAGNOSIS — G893 Neoplasm related pain (acute) (chronic): Secondary | ICD-10-CM

## 2015-07-08 DIAGNOSIS — Z95828 Presence of other vascular implants and grafts: Secondary | ICD-10-CM

## 2015-07-08 LAB — CBC WITH DIFFERENTIAL/PLATELET
BASO%: 1 % (ref 0.0–2.0)
Basophils Absolute: 0.1 10*3/uL (ref 0.0–0.1)
EOS%: 3 % (ref 0.0–7.0)
Eosinophils Absolute: 0.2 10*3/uL (ref 0.0–0.5)
HEMATOCRIT: 37.8 % — AB (ref 38.4–49.9)
HGB: 12.5 g/dL — ABNORMAL LOW (ref 13.0–17.1)
LYMPH#: 1.6 10*3/uL (ref 0.9–3.3)
LYMPH%: 29.7 % (ref 14.0–49.0)
MCH: 29.3 pg (ref 27.2–33.4)
MCHC: 33.2 g/dL (ref 32.0–36.0)
MCV: 88.4 fL (ref 79.3–98.0)
MONO#: 0.8 10*3/uL (ref 0.1–0.9)
MONO%: 15.5 % — ABNORMAL HIGH (ref 0.0–14.0)
NEUT%: 50.8 % (ref 39.0–75.0)
NEUTROS ABS: 2.7 10*3/uL (ref 1.5–6.5)
PLATELETS: 220 10*3/uL (ref 140–400)
RBC: 4.27 10*6/uL (ref 4.20–5.82)
RDW: 13.7 % (ref 11.0–14.6)
WBC: 5.3 10*3/uL (ref 4.0–10.3)

## 2015-07-08 LAB — COMPREHENSIVE METABOLIC PANEL
ALK PHOS: 130 U/L (ref 40–150)
ALT: 26 U/L (ref 0–55)
ANION GAP: 10 meq/L (ref 3–11)
AST: 27 U/L (ref 5–34)
Albumin: 4 g/dL (ref 3.5–5.0)
BUN: 26 mg/dL (ref 7.0–26.0)
CO2: 23 meq/L (ref 22–29)
CREATININE: 1.2 mg/dL (ref 0.7–1.3)
Calcium: 9.6 mg/dL (ref 8.4–10.4)
Chloride: 105 mEq/L (ref 98–109)
EGFR: 66 mL/min/{1.73_m2} — ABNORMAL LOW (ref >90)
GLUCOSE: 77 mg/dL (ref 70–140)
Potassium: 4.2 mEq/L (ref 3.5–5.1)
Sodium: 138 mEq/L (ref 136–145)
TOTAL PROTEIN: 8.6 g/dL — AB (ref 6.4–8.3)

## 2015-07-08 LAB — TSH: TSH: 1.899 m[IU]/L (ref 0.320–4.118)

## 2015-07-08 MED ORDER — SODIUM CHLORIDE 0.9 % IJ SOLN
10.0000 mL | INTRAMUSCULAR | Status: DC | PRN
  Administered 2015-07-08 (×2): 10 mL via INTRAVENOUS
  Filled 2015-07-08: qty 10

## 2015-07-08 MED ORDER — HEPARIN SOD (PORK) LOCK FLUSH 100 UNIT/ML IV SOLN
500.0000 [IU] | Freq: Once | INTRAVENOUS | Status: AC
Start: 2015-07-08 — End: 2015-07-08
  Administered 2015-07-08: 500 [IU] via INTRAVENOUS
  Filled 2015-07-08: qty 5

## 2015-07-08 MED ORDER — SODIUM CHLORIDE 0.9 % IV SOLN
240.0000 mg | Freq: Once | INTRAVENOUS | Status: AC
  Administered 2015-07-08: 240 mg via INTRAVENOUS
  Filled 2015-07-08: qty 20

## 2015-07-08 MED ORDER — SODIUM CHLORIDE 0.9 % IJ SOLN
10.0000 mL | INTRAMUSCULAR | Status: DC | PRN
  Filled 2015-07-08: qty 10

## 2015-07-08 MED ORDER — HEPARIN SOD (PORK) LOCK FLUSH 100 UNIT/ML IV SOLN
500.0000 [IU] | Freq: Once | INTRAVENOUS | Status: AC | PRN
  Administered 2015-07-08: 500 [IU]
  Filled 2015-07-08: qty 5

## 2015-07-08 MED ORDER — SODIUM CHLORIDE 0.9 % IV SOLN
Freq: Once | INTRAVENOUS | Status: AC
  Administered 2015-07-08: 11:00:00 via INTRAVENOUS

## 2015-07-08 NOTE — Progress Notes (Signed)
[facility] NOTE  Patient Care Team: [name], MD as PCP - General (Infectious Diseases) Minna Merritts, MD as Consulting Physician (Cardiology) Robert Bellow, MD (General Surgery) Leighton Ruff, MD as Consulting Physician (General Surgery) Irene Limbo, MD as Consulting Physician (Plastic Surgery) Truitt Merle, MD as Consulting Physician (Hematology) Festus Aloe, MD as Consulting Physician (Urology) Gery Pray, MD as Consulting Physician (Radiation Oncology)  DIAGNOSIS Recurrent anal cancer    Primary cancer of anal canal ([place])   02/05/2011 Initial Diagnosis Primary cancer of anal canal, T1N0M0, s/p surgical resection with positive margines. He received definitive dose RT 45 Gy over 5 weeks with 16Gy boosting and concurrent chemo with 5FU, treated at [facility] by Drs.  Geoffreyh and [employer].    11/23/2013 Relapse/Recurrence local recurrence    04/21/2014 Surgery abdominal perianal resection (APR) and VRAM flap closure of perineal wound on 04/21/14, surgical margins were positive for cancer    06/28/2014 - 09/25/2014 Chemotherapy 5-FU 1000mg /m2/day on D1-5, ciplatin 100mg /m2 on D2, every 28 days, s/p 4 cycles    10/11/2014 Progression CT CAP showed new liver lesions and abd/pelc adenopathy, highly suspecious for progressive metastatic disease    11/02/2014 Imaging abdomen MRI w wo contrast showed new small lesions in the right hepatic lobe with nonspecific enhancement, and enlarging nodes in the porta hepatis, metastatic disease not excluded    02/24/2015 Imaging PET scan showed hypermetabolic bone mets in right sacrum, and small hypermetabolic retroperitoneal lymph nodes, no other abnormal metabolic activity within the liver or other place.   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   04/15/2015 -  Chemotherapy Nivolumab 240 mg every 2 weeks     OTHER RELATED ISSUES: 1. (+) HIV, on therapy  with good control  2. CAD, s/p stent placement in [place]: Nivolumab 240mg  iv every 2 weeks, started on 04/15/2015  INTERIM HISTORY: Julian Palmer returns for follow up and nivo treatment. He is clinically doing well. His sacral pain has almost resolved, his not taking pain medication anymore. He works full-time, tolerating routine activities without any difficulty. He denies any other pain, abdominal discomfort or other symptoms. He has been tolerating the role well, mild fatigue, no other noticeable side effects.  MEDICAL HISTORY:  Past Medical History  Diagnosis Date  . S/p bare metal coronary artery stent     1998  pLAD  . Hypertension   . 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 Hyperlipidemia   . History of carcinoma in situ of anal canal     2010  . ED (erectile dysfunction)   . Sigmoid diverticulosis   . PONV (postoperative nausea and vomiting)   . Wears glasses   . At risk for sleep apnea     STOP-BANG= 4     SENT TO PCP 03-23-2014  . Coronary artery disease CARDIOLOGIST--  DR Rockey Situ --  LAST LOV 04-17-2013    S/P  PCI to mid and distal LAD and BM stent x1 to pLAD  . Anxiety   . Depression   . HIV positive ([place])   . 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 Primary cancer of anal canal ([place]) 05/18/2011  . Type 2 diabetes mellitus ([place])   .  Radiation 01/2011    definitive dose RT 45 Gy over 5 weeks with 16Gy boosting    SURGICAL HISTORY: Past Surgical History  Procedure Laterality Date  . Septoplasty  1985  . Sphincterotomy  2009  . Cardiovascular stress test  04/ 2010    low risk study/  very small region of mildly reduced perfersion is mildly reversible dLAD territory  . Resection anal mass  02-05-2011  . Excision anal polyp  2009  . Re-excision anal squamous cell  carcinoma  2010  . Removal rectal mass  01-08-2014  . Eua w/ bx perianal skin  06-08-2009  . Port-a-cath placement  02-21-2011    REMOVED  2013  . Coronary angioplasty with stent placement  1998   [facility]    BM stent to pLAD and PCI to Mid and Distal LAD/  no other significant cad  . Inguinal hernia repair Bilateral 1988  . Colonoscopy with propofol  05-28-2008  . Mass excision N/A 03/26/2014    Procedure: ANAL EXAM UNDER ANESTHESIA,EXCISIONAL BIOPSY OF ANAL MASS;  Surgeon: Leighton Ruff, MD;  Location: [facility];  Service: General;  Laterality: N/A;  . Laparoscopic assisted abdominal perineal resection N/A 04/22/2014    Procedure: LAPAROSCOPIC  ABDOMINAL PERINEAL RESECTION, ;  Surgeon: Leighton Ruff, MD;  Location: [facility];  Service: General;  Laterality: N/A;  . Urethrotomy Bilateral 04/22/2014    Procedure: CYSTOSCOPY/URETHROTOMY;  Surgeon: Leighton Ruff, MD;  Location: [facility];  Service: General;  Laterality: Bilateral;  . Muscle flap closure N/A 04/22/2014    Procedure: VRAM FLAP;  Surgeon: Irene Limbo, MD;  Location: [facility];  Service: Plastics;  Laterality: N/A;  . Portacath placement Left 06/24/2014    Procedure: INSERTION PORT-A-CATH LEFT SUBCLAVIAN;  Surgeon: Leighton Ruff, MD;  Location: [facility];  Service: General;  Laterality: Left;    SOCIAL HISTORY: Social History   Social History  . Marital Status: Single    Spouse Name: N/A  . Number of Children: 0  . Years of Education: N/A   Occupational History  .  [place] History Main Topics  . Smoking status: Never Smoker   . Smokeless tobacco: Never Used  . Alcohol Use: No     Comment: RARE  . Drug Use: No  . Sexual Activity: Not on file   Other Topics Concern  . Not on file   Social History Narrative   Full time. Single. Does not regularly exercise.     FAMILY HISTORY: Family History  Problem Relation Age of Onset  . Diabetes type II Mother   . Breast cancer Mother 45      survivor  . Hypertension Mother   . Coronary artery disease Father   Paternal GM had utrine cancer, no other history of malignancy   ALLERGIES:  is allergic to ace inhibitors and sulfa antibiotics.  MEDICATIONS:  Current Outpatient Prescriptions  Medication Sig Dispense Refill  . aspirin (ASPIR-81) 81 MG EC tablet Take 81 mg by mouth at bedtime.     Marland Kitchen atorvastatin (LIPITOR) 80 MG tablet Take 1 tablet (80 mg total) by mouth every evening. 90 tablet 0  . citalopram (CELEXA) 40 MG tablet Take 40 mg by mouth every morning.    Marland Kitchen efavirenz-emtrictabine-tenofovir (ATRIPLA) 600-200-300 MG per tablet Take 1 tablet by mouth at bedtime.     . Empagliflozin-Linagliptin (GLYXAMBI) 25-5 MG TABS Take 1 tablet by mouth daily.    Marland Kitchen glimepiride (AMARYL) 2 MG tablet Take 4 mg by mouth  daily with breakfast.     . lidocaine-prilocaine (EMLA) cream Apply topically as needed.    Marland Kitchen losartan (COZAAR) 100 MG tablet TAKE 1 TABLET EVERY DAY (Patient taking differently: TAKE 1 TABLET EVERY DAY at bedtime) 90 tablet 3  . metFORMIN (GLUCOPHAGE) 1000 MG tablet Take 1,000 mg by mouth 2 (two) times daily.      . Multiple Vitamins-Minerals (MULTIVITAL) tablet Take 1 tablet by mouth every evening.     Marland Kitchen oxyCODONE-acetaminophen (PERCOCET/ROXICET) 5-325 MG tablet Take 1 tablet by mouth every 6 (six) hours as needed for moderate pain or severe pain. 30 tablet 0   No current facility-administered medications for this visit.   Facility-Administered Medications Ordered in Other Visits  Medication Dose Route Frequency Provider Last Rate Last Dose  . heparin lock flush 100 unit/mL  500 Units Intravenous Once Truitt Merle, MD      . heparin lock flush 100 unit/mL  500 Units Intracatheter Once PRN Truitt Merle, MD      . sodium chloride 0.9 % injection 10 mL  10 mL Intravenous PRN Truitt Merle, MD      . sodium chloride 0.9 % injection 10 mL  10 mL Intracatheter PRN Truitt Merle, MD        REVIEW OF SYSTEMS:   Constitutional: Denies fevers,  chills or abnormal night sweats Eyes: Denies blurriness of vision, double vision or watery eyes Ears, nose, mouth, throat, and face: Denies mucositis or sore throat Respiratory: Denies cough, dyspnea or wheezes Cardiovascular: Denies palpitation, chest discomfort or lower extremity swelling Gastrointestinal:  Denies nausea, heartburn or change in bowel habits, (+) colostomy bag Skin: Mild skin pigmentation from his previous rash. Lymphatics: Denies new lymphadenopathy or easy bruising Neurological:Denies numbness, tingling or new weaknesses Behavioral/Psych: Mood is stable, no new changes  All other systems were reviewed with the patient and are negative.  PHYSICAL EXAMINATION: ECOG PERFORMANCE STATUS: 0  Filed Vitals:   07/08/15 0954  BP: 120/86  Pulse: 79  Temp: 98.5 F (36.9 C)  Resp: 18   Filed Weights   07/08/15 0954  Weight: 208 lb 8 oz (94.575 kg)    GENERAL:alert, no distress and comfortable SKIN: skin color, texture, turgor are normal, healed rashes on her arms, legs, and trunk with skin pigmentation.  EYES: normal, conjunctiva are pink and non-injected, sclera clear OROPHARYNX:no exudate, no erythema and lips, buccal mucosa, and tongue normal  NECK: supple, thyroid normal size, non-tender, without nodularity LYMPH:  no palpable lymphadenopathy in the cervical, axillary or inguinal LUNGS: clear to auscultation and percussion with normal breathing effort HEART: regular rate & rhythm and no murmurs and no lower extremity edema ABDOMEN:abdomen soft, non-tender and normal bowel sounds. (+) Colostomy bag. The perianal reconstruction site is completely healed.  Musculoskeletal:no cyanosis of digits and no clubbing  PSYCH: alert & oriented x 3 with fluent speech NEURO: no focal motor/sensory deficits SKIN: A few skin pigmentation from his previous rash  LABORATORY DATA:  I have reviewed the data as listed CBC Latest Ref Rng 07/08/2015 06/24/2015 06/10/2015  WBC 4.0 - 10.3  10e3/uL 5.3 6.0 6.2  Hemoglobin 13.0 - 17.1 g/dL 12.5(L) 12.4(L) 12.0(L)  Hematocrit 38.4 - 49.9 % 37.8(L) 37.6(L) 36.4(L)  Platelets 140 - 400 10e3/uL 220 240 253     Recent Labs  06/10/15 1411 06/24/15 1417 07/08/15 0851  NA 138 139 138  K 4.1 4.1 4.2  CO2 24 21* 23  GLUCOSE 153* 133 77  BUN 24.0 22.2 26.0  CREATININE 1.3 1.3  1.2  CALCIUM 8.9 9.1 9.6  PROT 8.1 8.2 8.6*  ALBUMIN 3.7 3.8 4.0  AST 26 25 27   ALT 27 24 26   ALKPHOS 120 124 130  BILITOT <0.30 <0.30 <0.30     PATHOLOGY REPORT 04/22/2014 Diagnosis 1. Soft tissue, biopsy, right pelvic sidewall; r/o malignancy - FIBROADIPOSE TISSUE, NO EVIDENCE OF MALIGNANCY. 2. Colon, segmental resection for tumor, rectum,sigmoid and anus - RECURRENT INVASIVE POORLY DIFFERENTIATED SQUAMOUS CELL CARCINOMA, INVADING THROUGH THE MUSCULARIS PROPRIA, INTO PERICOLONIC FATTY TISSUE, EXTENDING TO THE DEEP INKED MARGIN. - PERINEURAL INVASION AND ANGIOLYMPHATIC PRESENT. - SEVEN OF SEVENTEEN LYMPH NODES, POSITIVE FOR METASTATIC CARCINOMA (7/17) Microscopic Comment 2. Anus Specimen: Anus, rectum, sigmoid Procedure: Segmental resection Tumor site: Distal ends of specimen Specimen integrity: Intact Invasive tumor: Maximum size: At least 2.5 cm Histologic type(s): Invasive squamous cell carcinoma Histologic grade and differentiation: G2-3, moderately to poorly differentiated Microscopic extension of invasive tumor: Invading through the muscularis propria into pericolonic fatty tissue involving adjacent skeletal muscle tissue Lymph-Vascular invasion: Present Peri-neural invasion: Present Resection margins: Deep margin is positive for tumor Treatment effect (neo-adjuvant therapy): Present, minimally Lymph nodes: number examined 17; number positive: 7 Pathologic Staging: ypT2, yN1 (R1) Ancillary studies: N/A  Diagnosis 03/15/2015 Bone, biopsy, caudal left side of the sacrum - METASTATIC SQUAMOUS CELL CARCINOMA, SEE  COMMENT. Microscopic Comment Biopsies demonstrate extensive involvement by metastatic focally keratinizing, well to moderately differentiated squamous cell carcinoma. The previous anorectal resection demonstrating invasive squamous cell carcinoma is noted [id number]). (CRR:ecj 03/16/2015)  RADIOGRAPHIC STUDIES: I have personally reviewed the radiological images as listed and agreed with the findings in the report.  Pet 02/24/2015  IMPRESSION: 1. Dominant finding is new hypermetabolic skeletal metastasis in the RIGHT sacrum. 2. Small hypermetabolic retroperitoneal periaortic and periportal lymph nodes consistent with nodal metastasis to retroperitoneum. 3. No abnormal metabolic activity within the liver to suggest metastasis. Lesions on comparison MRI were very small which could limit PET accuracy.  PET 07/07/2015 IMPRESSION: 1. Compared with the previous study there has been interval progression of lytic metastasis involving the right side of sacrum. This now crosses the sacroiliac joint and involves the posterior right iliac bone. There is now a pathologic fracture involving the right sacral wing. 2. Improvement an retroperitoneal lymph node metastasis. 3. Small nonspecific nodule in the left upper lobe measures 7 mm and appears new from previous exam. 4. Aortic atherosclerosis and multi vessel coronary artery calcification 5. Gallstones.  ASSESSMENT & PLAN:  55 years old male with past medical history of anal squamous cell carcinoma, status post surgical resection and concurrent chemoradiation in 2009, now has local recurrence status post APR and  reconstruction.ypT2, yN1 (R1), unfortunately his deep surgical margin was positive, and 7 out of 17 lymph nodes were positive also. No distant metastasis on the preop PET scan. His medical history is also complicated by diabetes, coronary artery disease status post stent placement, and HIV on treatment.   1. Local recurrence of anal  squamous cell carcinoma, with positive surgical margines, biopsy proven sacral bone mets on 03/15/2015 -His recent PET scan from 02/24/2015 showed hypermetabolic skeletal metastasis in the right sacrum, and a few small hypermetabolic retroperitoneal lymph nodes, liver tissue was non-hypermetabolic, no other distant metastasis. -I discussed his sacral bone biopsy results, unfortunately he confirmed metastasis. -Patient does not want more chemotherapy. I discussed the clinical trial of immunotherapy new follow him at in metastatic and no cancer, the data was present in [facility] 2016. It is small study, a total of 37 patient's,  showed partial response rate about 20% and overall disease control rate about 80%. -He is tolerating Nivo very well, with excellent clinical response, his sacral pain has resolved now -I discussed the restaging PET scan from 07/07/2015 and reviewed the image in person with patient. The scan showed mixed response. The sacral bone metastases has increased metabolic activities, but the retroperitoneal lymph nodes has decreased. The prior scan was done one month before he started treatment, so the worsening sacral bone metastases could be related to that or possible pseudo-progression from [facility]. Giving his clinically doing very well, pain resolved, I recommend him to continue Nivo therapy, and repeat another PET in 2-3 months. -Lab result reviewed with him, unremarkable, we'll continue treatment.  2. Diabetes -controlled recently  -continue metformin and glimepiride, follow up with PCP   3. HIV  -Well controlled. Continue his treatment . His recent CD4 count was normal.   4. Hypertension and coronary artery disease  -He will continue follow-up with his primary care physician and cardiologist. Continue medications.   5. Sacral pain -Secondary to his sacral bone metastasis -resolved now after he started Nivo   6. depression -improved since he started treatment lately.   Plan -  Nivo today and continue every 2 weeks -I'll see him back in 4 weeks -repeat PET scan in 2-3 months    All questions were answered. The patient knows to call the clinic with any problems, questions or concerns.   I spent 25 minutes counseling the patient face to face. The total time spent in the appointment was 30 minutes and more than 50% was on counseling.     Truitt Merle, MD 07/08/2015

## 2015-07-08 NOTE — Patient Instructions (Signed)
[facility] Discharge Instructions for Patients Receiving Chemotherapy  Today you received the following: Nivolumab   To help prevent nausea and vomiting after your treatment, we encourage you to take your nausea medication as directed.   If you develop nausea and vomiting that is not controlled by your nausea medication, call the clinic.   BELOW ARE SYMPTOMS THAT SHOULD BE REPORTED IMMEDIATELY:  *FEVER GREATER THAN 100.5 F  *CHILLS WITH OR WITHOUT FEVER  NAUSEA AND VOMITING THAT IS NOT CONTROLLED WITH YOUR NAUSEA MEDICATION  *UNUSUAL SHORTNESS OF BREATH  *UNUSUAL BRUISING OR BLEEDING  TENDERNESS IN MOUTH AND THROAT WITH OR WITHOUT PRESENCE OF ULCERS  *URINARY PROBLEMS  *BOWEL PROBLEMS  UNUSUAL RASH Items with * indicate a potential emergency and should be followed up as soon as possible.  Feel free to call the clinic you have any questions or concerns. The clinic phone number is [phone].  Please show the CHEMO ALERT CARD at check-in to the Emergency Department and triage nurse.   

## 2015-07-08 NOTE — Telephone Encounter (Signed)
Gave patient avs report and appointments for Jan/Feb.

## 2015-07-18 ENCOUNTER — Encounter: Payer: Self-pay | Admitting: Hematology

## 2015-07-18 ENCOUNTER — Telehealth: Payer: Self-pay | Admitting: *Deleted

## 2015-07-18 NOTE — Telephone Encounter (Signed)
Pt called requesting an excuse letter from Dr. Burr Medico to defer his jury duty service in Feb, 2017.   Spoke with pt and asked pt to fax jury summons form to the office for Dr. Burr Medico to review. Pt's   Phone    [phone].

## 2015-07-19 NOTE — Telephone Encounter (Signed)
Patient called asking if jury duty letter can be mailed.  Asked when he needs letter as mail goes to central mail room before mailed which adds time.  With this call he will pick letter up when here 07-21-2015.  Letter currently at front entrance welcome registration desk.

## 2015-07-21 ENCOUNTER — Other Ambulatory Visit: Payer: Self-pay | Admitting: Hematology

## 2015-07-21 ENCOUNTER — Other Ambulatory Visit (HOSPITAL_BASED_OUTPATIENT_CLINIC_OR_DEPARTMENT_OTHER): Payer: Managed Care, Other (non HMO)

## 2015-07-21 ENCOUNTER — Ambulatory Visit: Payer: Managed Care, Other (non HMO)

## 2015-07-21 ENCOUNTER — Ambulatory Visit (HOSPITAL_BASED_OUTPATIENT_CLINIC_OR_DEPARTMENT_OTHER): Payer: Managed Care, Other (non HMO)

## 2015-07-21 ENCOUNTER — Telehealth: Payer: Self-pay | Admitting: Hematology

## 2015-07-21 VITALS — BP 131/73 | HR 70 | Temp 98.3°F | Resp 16

## 2015-07-21 DIAGNOSIS — C7951 Secondary malignant neoplasm of bone: Secondary | ICD-10-CM | POA: Diagnosis not present

## 2015-07-21 DIAGNOSIS — C211 Malignant neoplasm of anal canal: Secondary | ICD-10-CM

## 2015-07-21 DIAGNOSIS — Z5112 Encounter for antineoplastic immunotherapy: Secondary | ICD-10-CM | POA: Diagnosis not present

## 2015-07-21 DIAGNOSIS — Z95828 Presence of other vascular implants and grafts: Secondary | ICD-10-CM

## 2015-07-21 DIAGNOSIS — Z85048 Personal history of other malignant neoplasm of rectum, rectosigmoid junction, and anus: Secondary | ICD-10-CM

## 2015-07-21 LAB — COMPREHENSIVE METABOLIC PANEL
ALT: 26 U/L (ref 0–55)
AST: 28 U/L (ref 5–34)
Albumin: 3.6 g/dL (ref 3.5–5.0)
Alkaline Phosphatase: 121 U/L (ref 40–150)
Anion Gap: 9 mEq/L (ref 3–11)
BUN: 21.9 mg/dL (ref 7.0–26.0)
CHLORIDE: 103 meq/L (ref 98–109)
CO2: 26 mEq/L (ref 22–29)
Calcium: 9.4 mg/dL (ref 8.4–10.4)
Creatinine: 1.3 mg/dL (ref 0.7–1.3)
EGFR: 60 mL/min/{1.73_m2} — AB (ref >90)
GLUCOSE: 96 mg/dL (ref 70–140)
POTASSIUM: 4.2 meq/L (ref 3.5–5.1)
SODIUM: 138 meq/L (ref 136–145)
Total Bilirubin: <0.3 mg/dL (ref 0.20–1.20)
Total Protein: 7.7 g/dL (ref 6.4–8.3)

## 2015-07-21 LAB — CBC WITH DIFFERENTIAL/PLATELET
BASO%: 1.2 % (ref 0.0–2.0)
BASOS ABS: 0.1 10*3/uL (ref 0.0–0.1)
EOS%: 6.1 % (ref 0.0–7.0)
Eosinophils Absolute: 0.3 10*3/uL (ref 0.0–0.5)
HEMATOCRIT: 33.5 % — AB (ref 38.4–49.9)
HGB: 11.4 g/dL — ABNORMAL LOW (ref 13.0–17.1)
LYMPH%: 35.2 % (ref 14.0–49.0)
MCH: 29.9 pg (ref 27.2–33.4)
MCHC: 34 g/dL (ref 32.0–36.0)
MCV: 87.9 fL (ref 79.3–98.0)
MONO#: 0.5 10*3/uL (ref 0.1–0.9)
MONO%: 11.9 % (ref 0.0–14.0)
NEUT#: 1.9 10*3/uL (ref 1.5–6.5)
NEUT%: 45.6 % (ref 39.0–75.0)
PLATELETS: 209 10*3/uL (ref 140–400)
RBC: 3.81 10*6/uL — AB (ref 4.20–5.82)
RDW: 13.4 % (ref 11.0–14.6)
WBC: 4.1 10*3/uL (ref 4.0–10.3)
lymph#: 1.5 10*3/uL (ref 0.9–3.3)

## 2015-07-21 MED ORDER — SODIUM CHLORIDE 0.9% FLUSH
10.0000 mL | INTRAVENOUS | Status: DC | PRN
  Administered 2015-07-21: 10 mL via INTRAVENOUS
  Filled 2015-07-21: qty 10

## 2015-07-21 MED ORDER — SODIUM CHLORIDE 0.9 % IJ SOLN
10.0000 mL | INTRAMUSCULAR | Status: DC | PRN
  Administered 2015-07-21: 10 mL
  Filled 2015-07-21: qty 10

## 2015-07-21 MED ORDER — NIVOLUMAB CHEMO INJECTION 100 MG/10ML
240.0000 mg | Freq: Once | INTRAVENOUS | Status: AC
  Administered 2015-07-21: 240 mg via INTRAVENOUS
  Filled 2015-07-21: qty 20

## 2015-07-21 MED ORDER — SODIUM CHLORIDE 0.9 % IV SOLN
Freq: Once | INTRAVENOUS | Status: AC
  Administered 2015-07-21: 15:00:00 via INTRAVENOUS

## 2015-07-21 MED ORDER — HEPARIN SOD (PORK) LOCK FLUSH 100 UNIT/ML IV SOLN
500.0000 [IU] | Freq: Once | INTRAVENOUS | Status: AC | PRN
  Administered 2015-07-21: 500 [IU]
  Filled 2015-07-21: qty 5

## 2015-07-21 NOTE — Telephone Encounter (Signed)
Added flush appts for 2/9 + 2/23.

## 2015-07-21 NOTE — Patient Instructions (Signed)

## 2015-07-21 NOTE — Patient Instructions (Signed)
[facility] Discharge Instructions for Patients Receiving Chemotherapy  Today you received the following chemotherapy agents nivolumab   To help prevent nausea and vomiting after your treatment, we encourage you to take your nausea medication as directed   If you develop nausea and vomiting that is not controlled by your nausea medication, call the clinic.   BELOW ARE SYMPTOMS THAT SHOULD BE REPORTED IMMEDIATELY:  *FEVER GREATER THAN 100.5 F  *CHILLS WITH OR WITHOUT FEVER  NAUSEA AND VOMITING THAT IS NOT CONTROLLED WITH YOUR NAUSEA MEDICATION  *UNUSUAL SHORTNESS OF BREATH  *UNUSUAL BRUISING OR BLEEDING  TENDERNESS IN MOUTH AND THROAT WITH OR WITHOUT PRESENCE OF ULCERS  *URINARY PROBLEMS  *BOWEL PROBLEMS  UNUSUAL RASH Items with * indicate a potential emergency and should be followed up as soon as possible.  Feel free to call the clinic you have any questions or concerns. The clinic phone number is [phone].  

## 2015-08-04 ENCOUNTER — Telehealth: Payer: Self-pay | Admitting: *Deleted

## 2015-08-04 ENCOUNTER — Telehealth: Payer: Self-pay | Admitting: Hematology

## 2015-08-04 ENCOUNTER — Ambulatory Visit (HOSPITAL_BASED_OUTPATIENT_CLINIC_OR_DEPARTMENT_OTHER): Payer: Managed Care, Other (non HMO)

## 2015-08-04 ENCOUNTER — Ambulatory Visit (HOSPITAL_BASED_OUTPATIENT_CLINIC_OR_DEPARTMENT_OTHER): Payer: Managed Care, Other (non HMO) | Admitting: Hematology

## 2015-08-04 ENCOUNTER — Encounter: Payer: Self-pay | Admitting: Hematology

## 2015-08-04 ENCOUNTER — Other Ambulatory Visit (HOSPITAL_BASED_OUTPATIENT_CLINIC_OR_DEPARTMENT_OTHER): Payer: Managed Care, Other (non HMO)

## 2015-08-04 ENCOUNTER — Ambulatory Visit: Payer: Managed Care, Other (non HMO)

## 2015-08-04 VITALS — BP 139/79 | HR 91 | Temp 97.9°F | Resp 18 | Ht 70.0 in | Wt 211.4 lb

## 2015-08-04 DIAGNOSIS — Z95828 Presence of other vascular implants and grafts: Secondary | ICD-10-CM

## 2015-08-04 DIAGNOSIS — E1165 Type 2 diabetes mellitus with hyperglycemia: Secondary | ICD-10-CM

## 2015-08-04 DIAGNOSIS — C211 Malignant neoplasm of anal canal: Secondary | ICD-10-CM

## 2015-08-04 DIAGNOSIS — Z85048 Personal history of other malignant neoplasm of rectum, rectosigmoid junction, and anus: Secondary | ICD-10-CM

## 2015-08-04 DIAGNOSIS — I2510 Atherosclerotic heart disease of native coronary artery without angina pectoris: Secondary | ICD-10-CM

## 2015-08-04 DIAGNOSIS — C7951 Secondary malignant neoplasm of bone: Secondary | ICD-10-CM

## 2015-08-04 DIAGNOSIS — Z794 Long term (current) use of insulin: Secondary | ICD-10-CM

## 2015-08-04 DIAGNOSIS — IMO0001 Reserved for inherently not codable concepts without codable children: Secondary | ICD-10-CM

## 2015-08-04 DIAGNOSIS — Z79899 Other long term (current) drug therapy: Secondary | ICD-10-CM | POA: Diagnosis not present

## 2015-08-04 DIAGNOSIS — Z5112 Encounter for antineoplastic immunotherapy: Secondary | ICD-10-CM

## 2015-08-04 DIAGNOSIS — F329 Major depressive disorder, single episode, unspecified: Secondary | ICD-10-CM

## 2015-08-04 DIAGNOSIS — F32A Depression, unspecified: Secondary | ICD-10-CM

## 2015-08-04 LAB — CBC WITH DIFFERENTIAL/PLATELET
BASO%: 0.8 % (ref 0.0–2.0)
Basophils Absolute: 0.1 10*3/uL (ref 0.0–0.1)
EOS%: 3.2 % (ref 0.0–7.0)
Eosinophils Absolute: 0.2 10*3/uL (ref 0.0–0.5)
HEMATOCRIT: 36.2 % — AB (ref 38.4–49.9)
HEMOGLOBIN: 12 g/dL — AB (ref 13.0–17.1)
LYMPH#: 0.8 10*3/uL — AB (ref 0.9–3.3)
LYMPH%: 10.7 % — ABNORMAL LOW (ref 14.0–49.0)
MCH: 29.5 pg (ref 27.2–33.4)
MCHC: 33.3 g/dL (ref 32.0–36.0)
MCV: 88.6 fL (ref 79.3–98.0)
MONO#: 0.8 10*3/uL (ref 0.1–0.9)
MONO%: 11.2 % (ref 0.0–14.0)
NEUT%: 74.1 % (ref 39.0–75.0)
NEUTROS ABS: 5.5 10*3/uL (ref 1.5–6.5)
PLATELETS: 241 10*3/uL (ref 140–400)
RBC: 4.08 10*6/uL — ABNORMAL LOW (ref 4.20–5.82)
RDW: 13.9 % (ref 11.0–14.6)
WBC: 7.4 10*3/uL (ref 4.0–10.3)

## 2015-08-04 LAB — COMPREHENSIVE METABOLIC PANEL
ALT: 33 U/L (ref 0–55)
ANION GAP: 13 meq/L — AB (ref 3–11)
AST: 33 U/L (ref 5–34)
Albumin: 3.6 g/dL (ref 3.5–5.0)
Alkaline Phosphatase: 138 U/L (ref 40–150)
BUN: 28.6 mg/dL — AB (ref 7.0–26.0)
CALCIUM: 9.2 mg/dL (ref 8.4–10.4)
CHLORIDE: 103 meq/L (ref 98–109)
CO2: 20 mEq/L — ABNORMAL LOW (ref 22–29)
CREATININE: 1.4 mg/dL — AB (ref 0.7–1.3)
EGFR: 57 mL/min/{1.73_m2} — ABNORMAL LOW (ref >90)
Glucose: 211 mg/dl — ABNORMAL HIGH (ref 70–140)
Potassium: 4.2 mEq/L (ref 3.5–5.1)
Sodium: 137 mEq/L (ref 136–145)
TOTAL PROTEIN: 8.2 g/dL (ref 6.4–8.3)

## 2015-08-04 LAB — TSH: TSH: 1.423 m[IU]/L (ref 0.320–4.118)

## 2015-08-04 MED ORDER — SODIUM CHLORIDE 0.9 % IV SOLN
240.0000 mg | Freq: Once | INTRAVENOUS | Status: AC
  Administered 2015-08-04: 240 mg via INTRAVENOUS
  Filled 2015-08-04: qty 20

## 2015-08-04 MED ORDER — SODIUM CHLORIDE 0.9% FLUSH
10.0000 mL | INTRAVENOUS | Status: DC | PRN
  Administered 2015-08-04: 10 mL via INTRAVENOUS
  Filled 2015-08-04: qty 10

## 2015-08-04 MED ORDER — SODIUM CHLORIDE 0.9 % IJ SOLN
10.0000 mL | INTRAMUSCULAR | Status: DC | PRN
  Administered 2015-08-04: 10 mL
  Filled 2015-08-04: qty 10

## 2015-08-04 MED ORDER — HEPARIN SOD (PORK) LOCK FLUSH 100 UNIT/ML IV SOLN
500.0000 [IU] | Freq: Once | INTRAVENOUS | Status: AC | PRN
  Administered 2015-08-04: 500 [IU]
  Filled 2015-08-04: qty 5

## 2015-08-04 MED ORDER — SODIUM CHLORIDE 0.9 % IV SOLN
Freq: Once | INTRAVENOUS | Status: AC
  Administered 2015-08-04: 15:00:00 via INTRAVENOUS

## 2015-08-04 NOTE — Patient Instructions (Signed)
[facility] Discharge Instructions for Patients Receiving Chemotherapy  Today you received the following chemotherapy agents Nivolumab.  To help prevent nausea and vomiting after your treatment, we encourage you to take your nausea medication as prescribed.   If you develop nausea and vomiting that is not controlled by your nausea medication, call the clinic.   BELOW ARE SYMPTOMS THAT SHOULD BE REPORTED IMMEDIATELY:  *FEVER GREATER THAN 100.5 F  *CHILLS WITH OR WITHOUT FEVER  NAUSEA AND VOMITING THAT IS NOT CONTROLLED WITH YOUR NAUSEA MEDICATION  *UNUSUAL SHORTNESS OF BREATH  *UNUSUAL BRUISING OR BLEEDING  TENDERNESS IN MOUTH AND THROAT WITH OR WITHOUT PRESENCE OF ULCERS  *URINARY PROBLEMS  *BOWEL PROBLEMS  UNUSUAL RASH Items with * indicate a potential emergency and should be followed up as soon as possible.  Feel free to call the clinic you have any questions or concerns. The clinic phone number is [phone].  Please show the CHEMO ALERT CARD at check-in to the Emergency Department and triage nurse.   

## 2015-08-04 NOTE — Telephone Encounter (Signed)
per pof to sch pt appt-sent MW email to sch pt trmt-pt to get updated copy b4 leaving °

## 2015-08-04 NOTE — Patient Instructions (Signed)

## 2015-08-04 NOTE — Telephone Encounter (Signed)
Per staff message and POF I have scheduled appts. Advised scheduler of appts. JMW  

## 2015-08-04 NOTE — Progress Notes (Signed)
[place] NOTE  Patient Care Team: [name], MD as PCP - General (Infectious Diseases) Minna Merritts, MD as Consulting Physician (Cardiology) Robert Bellow, MD (General Surgery) Leighton Ruff, MD as Consulting Physician (General Surgery) Irene Limbo, MD as Consulting Physician (Plastic Surgery) Truitt Merle, MD as Consulting Physician (Hematology) Festus Aloe, MD as Consulting Physician (Urology) Gery Pray, MD as Consulting Physician (Radiation Oncology)  DIAGNOSIS Recurrent anal cancer    Primary cancer of anal canal ([place])   02/05/2011 Initial Diagnosis Primary cancer of anal canal, T1N0M0, s/p surgical resection with positive margines. He received definitive dose RT 45 Gy over 5 weeks with 16Gy boosting and concurrent chemo with 5FU, treated at [facility] by Drs.  Geoffreyh and [employer].    11/23/2013 Relapse/Recurrence local recurrence    04/21/2014 Surgery abdominal perianal resection (APR) and VRAM flap closure of perineal wound on 04/21/14, surgical margins were positive for cancer    06/28/2014 - 09/25/2014 Chemotherapy 5-FU 1000mg /m2/day on D1-5, ciplatin 100mg /m2 on D2, every 28 days, s/p 4 cycles    10/11/2014 Progression CT CAP showed new liver lesions and abd/pelc adenopathy, highly suspecious for progressive metastatic disease    11/02/2014 Imaging abdomen MRI w wo contrast showed new small lesions in the right hepatic lobe with nonspecific enhancement, and enlarging nodes in the porta hepatis, metastatic disease not excluded    02/24/2015 Imaging PET scan showed hypermetabolic bone mets in right sacrum, and small hypermetabolic retroperitoneal lymph nodes, no other abnormal metabolic activity within the liver or other place.   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   04/15/2015 -  Chemotherapy Nivolumab 240 mg every 2 weeks     OTHER RELATED ISSUES: 1. (+) HIV, on therapy  with good control  2. CAD, s/p stent placement in [place]: Nivolumab 240mg  iv every 2 weeks, started on 04/15/2015  INTERIM HISTORY: Julian Palmer returns for follow up and nivo treatment. He is doing well overall. He noticed mild right paraspinal low back pain a week ago, possible pulled muscle, he has been taking aleve and the pain has improved. His sacral pain has near resolved, further new complaints. He has good appetite, and energy level, works full-time.  MEDICAL HISTORY:  Past Medical History  Diagnosis Date  . S/p bare metal coronary artery stent     1998  pLAD  . Hypertension   . 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 Hyperlipidemia   . History of carcinoma in situ of anal canal     2010  . ED (erectile dysfunction)   . Sigmoid diverticulosis   . PONV (postoperative nausea and vomiting)   . Wears glasses   . At risk for sleep apnea     STOP-BANG= 4     SENT TO PCP 03-23-2014  . Coronary artery disease CARDIOLOGIST--  DR Rockey Situ --  LAST LOV 04-17-2013    S/P  PCI to mid and distal LAD and BM stent x1 to pLAD  . Anxiety   . Depression   . HIV positive ([place])   . 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 Primary cancer of anal canal ([place]) 05/18/2011  . Type 2 diabetes mellitus ([place])   . Radiation 01/2011  definitive dose RT 45 Gy over 5 weeks with 16Gy boosting    SURGICAL HISTORY: Past Surgical History  Procedure Laterality Date  . Septoplasty  1985  . Sphincterotomy  2009  . Cardiovascular stress test  04/ 2010    low risk study/  very small region of mildly reduced perfersion is mildly reversible dLAD territory  . Resection anal mass  02-05-2011  . Excision anal polyp  2009  . Re-excision anal squamous cell carcinoma  2010  . Removal rectal mass  01-08-2014  . Eua  w/ bx perianal skin  06-08-2009  . Port-a-cath placement  02-21-2011    REMOVED  2013  . Coronary angioplasty with stent placement  1998   [facility]    BM stent to pLAD and PCI to Mid and Distal LAD/  no other significant cad  . Inguinal hernia repair Bilateral 1988  . Colonoscopy with propofol  05-28-2008  . Mass excision N/A 03/26/2014    Procedure: ANAL EXAM UNDER ANESTHESIA,EXCISIONAL BIOPSY OF ANAL MASS;  Surgeon: Leighton Ruff, MD;  Location: [facility];  Service: General;  Laterality: N/A;  . Laparoscopic assisted abdominal perineal resection N/A 04/22/2014    Procedure: LAPAROSCOPIC  ABDOMINAL PERINEAL RESECTION, ;  Surgeon: Leighton Ruff, MD;  Location: [facility];  Service: General;  Laterality: N/A;  . Urethrotomy Bilateral 04/22/2014    Procedure: CYSTOSCOPY/URETHROTOMY;  Surgeon: Leighton Ruff, MD;  Location: [facility];  Service: General;  Laterality: Bilateral;  . Muscle flap closure N/A 04/22/2014    Procedure: VRAM FLAP;  Surgeon: Irene Limbo, MD;  Location: [facility];  Service: Plastics;  Laterality: N/A;  . Portacath placement Left 06/24/2014    Procedure: INSERTION PORT-A-CATH LEFT SUBCLAVIAN;  Surgeon: Leighton Ruff, MD;  Location: [facility];  Service: General;  Laterality: Left;    SOCIAL HISTORY: Social History   Social History  . Marital Status: Single    Spouse Name: N/A  . Number of Children: 0  . Years of Education: N/A   Occupational History  .  [place] History Main Topics  . Smoking status: Never Smoker   . Smokeless tobacco: Never Used  . Alcohol Use: No     Comment: RARE  . Drug Use: No  . Sexual Activity: Not on file   Other Topics Concern  . Not on file   Social History Narrative   Full time. Single. Does not regularly exercise.     FAMILY HISTORY: Family History  Problem Relation Age of Onset  . Diabetes type II Mother   . Breast cancer Mother 54     survivor  . Hypertension Mother   . Coronary artery disease  Father   Paternal GM had utrine cancer, no other history of malignancy   ALLERGIES:  is allergic to ace inhibitors and sulfa antibiotics.  MEDICATIONS:  Current Outpatient Prescriptions  Medication Sig Dispense Refill  . aspirin (ASPIR-81) 81 MG EC tablet Take 81 mg by mouth at bedtime.     Marland Kitchen atorvastatin (LIPITOR) 80 MG tablet Take 1 tablet (80 mg total) by mouth every evening. 90 tablet 0  . citalopram (CELEXA) 40 MG tablet Take 40 mg by mouth every morning.    Marland Kitchen efavirenz-emtrictabine-tenofovir (ATRIPLA) 600-200-300 MG per tablet Take 1 tablet by mouth at bedtime.     . Empagliflozin-Linagliptin (GLYXAMBI) 25-5 MG TABS Take 1 tablet by mouth daily.    Marland Kitchen glimepiride (AMARYL) 2 MG tablet Take 4 mg by mouth daily with breakfast.     .  lidocaine-prilocaine (EMLA) cream Apply topically as needed.    Marland Kitchen losartan (COZAAR) 100 MG tablet TAKE 1 TABLET EVERY DAY (Patient taking differently: TAKE 1 TABLET EVERY DAY at bedtime) 90 tablet 3  . metFORMIN (GLUCOPHAGE) 1000 MG tablet Take 1,000 mg by mouth 2 (two) times daily.      . Multiple Vitamins-Minerals (MULTIVITAL) tablet Take 1 tablet by mouth every evening.     Marland Kitchen oxyCODONE-acetaminophen (PERCOCET/ROXICET) 5-325 MG tablet Take 1 tablet by mouth every 6 (six) hours as needed for moderate pain or severe pain. 30 tablet 0   No current facility-administered medications for this visit.   Facility-Administered Medications Ordered in Other Visits  Medication Dose Route Frequency Provider Last Rate Last Dose  . 0.9 %  sodium chloride infusion   Intravenous Once Truitt Merle, MD      . heparin lock flush 100 unit/mL  500 Units Intravenous Once Truitt Merle, MD      . heparin lock flush 100 unit/mL  500 Units Intracatheter Once PRN Truitt Merle, MD      . heparin lock flush 100 unit/mL  500 Units Intracatheter Once PRN Truitt Merle, MD      . nivolumab (OPDIVO) 240 mg in sodium chloride 0.9 % 100 mL chemo infusion  240 mg Intravenous Once Truitt Merle, MD      . sodium  chloride 0.9 % injection 10 mL  10 mL Intravenous PRN Truitt Merle, MD      . sodium chloride 0.9 % injection 10 mL  10 mL Intracatheter PRN Truitt Merle, MD      . sodium chloride 0.9 % injection 10 mL  10 mL Intracatheter PRN Truitt Merle, MD        REVIEW OF SYSTEMS:   Constitutional: Denies fevers, chills or abnormal night sweats Eyes: Denies blurriness of vision, double vision or watery eyes Ears, nose, mouth, throat, and face: Denies mucositis or sore throat Respiratory: Denies cough, dyspnea or wheezes Cardiovascular: Denies palpitation, chest discomfort or lower extremity swelling Gastrointestinal:  Denies nausea, heartburn or change in bowel habits, (+) colostomy bag Skin: Mild skin pigmentation from his previous rash. Lymphatics: Denies new lymphadenopathy or easy bruising Neurological:Denies numbness, tingling or new weaknesses Behavioral/Psych: Mood is stable, no new changes  All other systems were reviewed with the patient and are negative.  PHYSICAL EXAMINATION: ECOG PERFORMANCE STATUS: 0  Filed Vitals:   08/04/15 1415  BP: 139/79  Pulse: 91  Temp: 97.9 F (36.6 C)  Resp: 18   Filed Weights   08/04/15 1415  Weight: 211 lb 6.4 oz (95.89 kg)    GENERAL:alert, no distress and comfortable SKIN: skin color, texture, turgor are normal, healed rashes on her arms, legs, and trunk with skin pigmentation.  EYES: normal, conjunctiva are pink and non-injected, sclera clear OROPHARYNX:no exudate, no erythema and lips, buccal mucosa, and tongue normal  NECK: supple, thyroid normal size, non-tender, without nodularity LYMPH:  no palpable lymphadenopathy in the cervical, axillary or inguinal LUNGS: clear to auscultation and percussion with normal breathing effort HEART: regular rate & rhythm and no murmurs and no lower extremity edema ABDOMEN:abdomen soft, non-tender and normal bowel sounds. (+) Colostomy bag. The perianal reconstruction site is completely healed.  Musculoskeletal:no  cyanosis of digits and no clubbing  PSYCH: alert & oriented x 3 with fluent speech NEURO: no focal motor/sensory deficits SKIN: A few skin pigmentation from his previous rash  LABORATORY DATA:  I have reviewed the data as listed CBC Latest Ref Rng 08/04/2015 07/21/2015  07/08/2015  WBC 4.0 - 10.3 10e3/uL 7.4 4.1 5.3  Hemoglobin 13.0 - 17.1 g/dL 12.0(L) 11.4(L) 12.5(L)  Hematocrit 38.4 - 49.9 % 36.2(L) 33.5(L) 37.8(L)  Platelets 140 - 400 10e3/uL 241 209 220     Recent Labs  07/08/15 0851 07/21/15 1409 08/04/15 1338  NA 138 138 137  K 4.2 4.2 4.2  CO2 23 26 20*  GLUCOSE 77 96 211*  BUN 26.0 21.9 28.6*  CREATININE 1.2 1.3 1.4*  CALCIUM 9.6 9.4 9.2  PROT 8.6* 7.7 8.2  ALBUMIN 4.0 3.6 3.6  AST 27 28 33  ALT 26 26 33  ALKPHOS 130 121 138  BILITOT <0.30 <0.30 <0.30     PATHOLOGY REPORT 04/22/2014 Diagnosis 1. Soft tissue, biopsy, right pelvic sidewall; r/o malignancy - FIBROADIPOSE TISSUE, NO EVIDENCE OF MALIGNANCY. 2. Colon, segmental resection for tumor, rectum,sigmoid and anus - RECURRENT INVASIVE POORLY DIFFERENTIATED SQUAMOUS CELL CARCINOMA, INVADING THROUGH THE MUSCULARIS PROPRIA, INTO PERICOLONIC FATTY TISSUE, EXTENDING TO THE DEEP INKED MARGIN. - PERINEURAL INVASION AND ANGIOLYMPHATIC PRESENT. - SEVEN OF SEVENTEEN LYMPH NODES, POSITIVE FOR METASTATIC CARCINOMA (7/17) Microscopic Comment 2. Anus Specimen: Anus, rectum, sigmoid Procedure: Segmental resection Tumor site: Distal ends of specimen Specimen integrity: Intact Invasive tumor: Maximum size: At least 2.5 cm Histologic type(s): Invasive squamous cell carcinoma Histologic grade and differentiation: G2-3, moderately to poorly differentiated Microscopic extension of invasive tumor: Invading through the muscularis propria into pericolonic fatty tissue involving adjacent skeletal muscle tissue Lymph-Vascular invasion: Present Peri-neural invasion: Present Resection margins: Deep margin is positive for  tumor Treatment effect (neo-adjuvant therapy): Present, minimally Lymph nodes: number examined 17; number positive: 7 Pathologic Staging: ypT2, yN1 (R1) Ancillary studies: N/A  Diagnosis 03/15/2015 Bone, biopsy, caudal left side of the sacrum - METASTATIC SQUAMOUS CELL CARCINOMA, SEE COMMENT. Microscopic Comment Biopsies demonstrate extensive involvement by metastatic focally keratinizing, well to moderately differentiated squamous cell carcinoma. The previous anorectal resection demonstrating invasive squamous cell carcinoma is noted [id number]). (CRR:ecj 03/16/2015)  RADIOGRAPHIC STUDIES: I have personally reviewed the radiological images as listed and agreed with the findings in the report.  Pet 02/24/2015  IMPRESSION: 1. Dominant finding is new hypermetabolic skeletal metastasis in the RIGHT sacrum. 2. Small hypermetabolic retroperitoneal periaortic and periportal lymph nodes consistent with nodal metastasis to retroperitoneum. 3. No abnormal metabolic activity within the liver to suggest metastasis. Lesions on comparison MRI were very small which could limit PET accuracy.  PET 07/07/2015 IMPRESSION: 1. Compared with the previous study there has been interval progression of lytic metastasis involving the right side of sacrum. This now crosses the sacroiliac joint and involves the posterior right iliac bone. There is now a pathologic fracture involving the right sacral wing. 2. Improvement an retroperitoneal lymph node metastasis. 3. Small nonspecific nodule in the left upper lobe measures 7 mm and appears new from previous exam. 4. Aortic atherosclerosis and multi vessel coronary artery calcification 5. Gallstones.  ASSESSMENT & PLAN:  55 years old male with past medical history of anal squamous cell carcinoma, status post surgical resection and concurrent chemoradiation in 2009, now has local recurrence status post APR and  reconstruction.ypT2, yN1 (R1), unfortunately his  deep surgical margin was positive, and 7 out of 17 lymph nodes were positive also. No distant metastasis on the preop PET scan. His medical history is also complicated by diabetes, coronary artery disease status post stent placement, and HIV on treatment.   1. Local recurrence of anal squamous cell carcinoma, with positive surgical margines, biopsy proven sacral bone mets on 03/15/2015 -  His recent PET scan from 02/24/2015 showed hypermetabolic skeletal metastasis in the right sacrum, and a few small hypermetabolic retroperitoneal lymph nodes, liver tissue was non-hypermetabolic, no other distant metastasis. -I discussed his sacral bone biopsy results, unfortunately he confirmed metastasis. -Patient does not want more chemotherapy. I discussed the clinical trial of immunotherapy new follow him at in metastatic and no cancer, the data was present in ASCO 2016. It is small study, a total of 37 patient's, showed partial response rate about 20% and overall disease control rate about 80%. -He is tolerating Nivo very well, with excellent clinical response, his sacral pain has resolved now -I discussed the restaging PET scan from 07/07/2015 and reviewed the image in person with patient. The scan showed mixed response. The sacral bone metastases has increased metabolic activities, but the retroperitoneal lymph nodes has decreased. The prior scan was done one month before he started treatment, so the worsening sacral bone metastases could be related to that or possible pseudo-progression from [facility]. Giving his clinically doing very well, pain resolved, I recommend him to continue Nivo therapy, and repeat another PET in early April. -Lab result reviewed with him, unremarkable, we'll continue treatment. his TSH has been normal since he started treatment.   2. Diabetes -controlled recently  -continue metformin and glimepiride, follow up with his endocrinologist   3. HIV  -Well controlled. Continue his treatment .  His recent CD4 count was normal.   4. Hypertension and coronary artery disease  -He will continue follow-up with his primary care physician and cardiologist. Continue medications.   5. Sacral  and right low back pain -His sacral pain is secondary to his sacral bone metastasis, and has nearly resolved since he started treatment.  -I encouraged him to use heating pad and Aleve as needed for his right low back pain, possible muscular pain   6. depression -improved since he started treatment lately.   Plan - Nivo today and continue every 2 weeks -I'll see him back in 4 weeks -repeat PET scan in early April    All questions were answered. The patient knows to call the clinic with any problems, questions or concerns.   I spent 20 minutes counseling the patient face to face. The total time spent in the appointment was 25 minutes and more than 50% was on counseling.     Truitt Merle, MD 08/04/2015

## 2015-08-04 NOTE — Progress Notes (Signed)
Pt saw Dr. Burr Medico prior to chemo today.  OK to treat with all lab results today as per Dr. Burr Medico.

## 2015-08-08 ENCOUNTER — Encounter: Payer: Self-pay | Admitting: Cardiovascular Disease

## 2015-08-08 ENCOUNTER — Ambulatory Visit (INDEPENDENT_AMBULATORY_CARE_PROVIDER_SITE_OTHER): Payer: Managed Care, Other (non HMO) | Admitting: Cardiovascular Disease

## 2015-08-08 VITALS — BP 120/66 | HR 86 | Ht 72.0 in | Wt 210.2 lb

## 2015-08-08 DIAGNOSIS — I2510 Atherosclerotic heart disease of native coronary artery without angina pectoris: Secondary | ICD-10-CM | POA: Diagnosis not present

## 2015-08-08 DIAGNOSIS — Z794 Long term (current) use of insulin: Secondary | ICD-10-CM

## 2015-08-08 DIAGNOSIS — E785 Hyperlipidemia, unspecified: Secondary | ICD-10-CM | POA: Diagnosis not present

## 2015-08-08 DIAGNOSIS — C211 Malignant neoplasm of anal canal: Secondary | ICD-10-CM

## 2015-08-08 DIAGNOSIS — I10 Essential (primary) hypertension: Secondary | ICD-10-CM | POA: Diagnosis not present

## 2015-08-08 DIAGNOSIS — E1165 Type 2 diabetes mellitus with hyperglycemia: Secondary | ICD-10-CM

## 2015-08-08 DIAGNOSIS — IMO0001 Reserved for inherently not codable concepts without codable children: Secondary | ICD-10-CM

## 2015-08-08 MED ORDER — ATORVASTATIN CALCIUM 80 MG PO TABS: 80.0000 mg | ORAL_TABLET | Freq: Every evening | ORAL | Status: AC

## 2015-08-08 MED ORDER — LOSARTAN POTASSIUM 100 MG PO TABS: 100.0000 mg | ORAL_TABLET | Freq: Every day | ORAL | Status: DC

## 2015-08-08 NOTE — Patient Instructions (Signed)
You are doing well. No medication changes were made.  Please have lfts and lipids drawn, fasting  Please call us if you have new issues that need to be addressed before your next appt.  Your physician wants you to follow-up in: 6 months.  You will receive a reminder letter in the mail two months in advance. If you don't receive a letter, please call our office to schedule the follow-up appointment.

## 2015-08-09 NOTE — Progress Notes (Signed)
Patient ID: Julian Palmer, male    DOB: May 17, 1961, 55 y.o.   MRN: [id number]  HPI Comments: Mr. Lipe is a very pleasant 55 year old gentleman with a history of coronary artery disease, stent placed to his proximal LAD in 1998 with angioplasty to the mid and distal LAD, diabetes, hypertension, history of HIV, rectal cancer, status post chemotherapy and radiation,  who presents for routine followup of his coronary artery disease    History of rectal cancer, s/p resection on February 05 2011, followed by radiation of more than 30 cycles, chemotherapy for 2 weeks.  Recurrence of his cancer in late 2015 surgical evaluation 03/26/2014 that did not show clear margins unfortunately . He is undergone radical surgical resection, rectus flap reconstruction, now with ostomy  He completed treatment with 5-FU and cisplatin, last chemotherapy April 2016, and radiation  several spots on his liver, some are new.  Currently on suppressive chemotherapy  Denies any chest pain or shortness of breath concerning for angina. Reports his sugars continue to run high No regular exercise  seen by psychologist, psychiatry, is on antidepressants . Difficulty sleeping, on medications for sleep   EKG on today's visit shows normal sinus rhythm, rate 86 bpm, no significant ST or T-wave changes  Other past medical history Cardiac catheterization report from 1998 shows a totally occluded LAD after the first septal branch. Otherwise no other significant coronary artery disease. Balloon angioplasty was performed of the proximal mid LAD, stent placed to the proximal LAD with a 3.5 x 15 mm Multi-Link stent. Distal LAD was then angioplastied   Stress test in April 2010 was low risk scan, where he achieved 10 METS, with a very small region of mildly reduced perfusion is mildly reversible in the distal LAD territory   Allergies  Allergen Reactions  . Ace Inhibitors Other (See Comments)  . Sulfa Antibiotics Hives    Current  Outpatient Prescriptions on File Prior to Visit  Medication Sig Dispense Refill  . aspirin (ASPIR-81) 81 MG EC tablet Take 81 mg by mouth at bedtime.     . citalopram (CELEXA) 40 MG tablet Take 40 mg by mouth every morning.    Marland Kitchen efavirenz-emtrictabine-tenofovir (ATRIPLA) 600-200-300 MG per tablet Take 1 tablet by mouth at bedtime.     . Empagliflozin-Linagliptin (GLYXAMBI) 25-5 MG TABS Take 1 tablet by mouth daily.    Marland Kitchen glimepiride (AMARYL) 2 MG tablet Take 4 mg by mouth daily with breakfast.     . lidocaine-prilocaine (EMLA) cream Apply topically as needed.    . metFORMIN (GLUCOPHAGE) 1000 MG tablet Take 1,000 mg by mouth 2 (two) times daily.      . Multiple Vitamins-Minerals (MULTIVITAL) tablet Take 1 tablet by mouth every evening.     Marland Kitchen oxyCODONE-acetaminophen (PERCOCET/ROXICET) 5-325 MG tablet Take 1 tablet by mouth every 6 (six) hours as needed for moderate pain or severe pain. 30 tablet 0   Current Facility-Administered Medications on File Prior to Visit  Medication Dose Route Frequency Provider Last Rate Last Dose  . heparin lock flush 100 unit/mL  500 Units Intravenous Once Truitt Merle, MD      . heparin lock flush 100 unit/mL  500 Units Intracatheter Once PRN Truitt Merle, MD      . sodium chloride 0.9 % injection 10 mL  10 mL Intravenous PRN Truitt Merle, MD      . sodium chloride 0.9 % injection 10 mL  10 mL Intracatheter PRN Truitt Merle, MD  Past Medical History  Diagnosis Date  . S/p bare metal coronary artery stent     1998  pLAD  . Hypertension   . 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 Hyperlipidemia   . History of carcinoma in situ of anal canal     2010  . ED (erectile dysfunction)   . Sigmoid diverticulosis   . PONV (postoperative nausea and vomiting)   . Wears glasses   . At risk for sleep apnea     STOP-BANG= 4     SENT  TO PCP 03-23-2014  . Coronary artery disease CARDIOLOGIST--  DR Rockey Situ --  LAST LOV 04-17-2013    S/P  PCI to mid and distal LAD and BM stent x1 to pLAD  . Anxiety   . Depression   . HIV positive ([place])   . Type 2 diabetes mellitus ([place])   . Radiation 01/2011    definitive dose RT 45 Gy over 5 weeks with 16Gy boosting  . 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 Primary cancer of anal canal ([place]) 05/18/2011    Past Surgical History  Procedure Laterality Date  . Septoplasty  1985  . Sphincterotomy  2009  . Cardiovascular stress test  04/ 2010    low risk study/  very small region of mildly reduced perfersion is mildly reversible dLAD territory  . Resection anal mass  02-05-2011  . Excision anal polyp  2009  . Re-excision anal squamous cell carcinoma  2010  . Removal rectal mass  01-08-2014  . Eua w/ bx perianal skin  06-08-2009  . Port-a-cath placement  02-21-2011    REMOVED  2013  . Coronary angioplasty with stent placement  1998   [facility]    BM stent to pLAD and PCI to Mid and Distal LAD/  no other significant cad  . Inguinal hernia repair Bilateral 1988  . Colonoscopy with propofol  05-28-2008  . Mass excision N/A 03/26/2014    Procedure: ANAL EXAM UNDER ANESTHESIA,EXCISIONAL BIOPSY OF ANAL MASS;  Surgeon: Leighton Ruff, MD;  Location: [facility];  Service: General;  Laterality: N/A;  . Laparoscopic assisted abdominal perineal resection N/A 04/22/2014    Procedure: LAPAROSCOPIC  ABDOMINAL PERINEAL RESECTION, ;  Surgeon: Leighton Ruff, MD;  Location: [facility];  Service: General;  Laterality: N/A;  . Urethrotomy Bilateral 04/22/2014    Procedure: CYSTOSCOPY/URETHROTOMY;  Surgeon: Leighton Ruff, MD;  Location: [facility];  Service: General;  Laterality: Bilateral;  . Muscle flap closure N/A 04/22/2014    Procedure: VRAM FLAP;  Surgeon: Irene Limbo, MD;  Location: [facility];  Service: Plastics;  Laterality: N/A;  . Portacath placement  Left 06/24/2014    Procedure: INSERTION PORT-A-CATH LEFT SUBCLAVIAN;  Surgeon: Leighton Ruff, MD;  Location: [facility];  Service: General;  Laterality: Left;    Social History  reports that he has never smoked. He has never used smokeless tobacco. He reports that he does not drink alcohol or use illicit drugs.  Family History family history includes Breast cancer in his mother; Coronary artery disease in his father; Diabetes type II in his mother; Hypertension in his mother; Uterine cancer in his paternal grandmother.   Review of Systems  Constitutional: Negative.   Respiratory: Negative.   Cardiovascular: Negative.   Gastrointestinal: Negative.  Genitourinary:       Erectile dysfunction  Musculoskeletal: Negative.   Neurological: Negative.   Hematological: Negative.   Psychiatric/Behavioral: Negative.   All other systems reviewed and are negative.   BP 120/66 mmHg  Pulse 86  Ht 6' (1.829 m)  Wt 210 lb 4 oz (95.369 kg)  BMI 28.51 kg/m2  Physical Exam  Constitutional: He is oriented to person, place, and time. He appears well-developed and well-nourished.  HENT:  Head: Normocephalic.  Nose: Nose normal.  Mouth/Throat: Oropharynx is clear and moist.  Eyes: Conjunctivae are normal. Pupils are equal, round, and reactive to light.  Neck: Normal range of motion. Neck supple. No JVD present.  Cardiovascular: Normal rate, regular rhythm, S1 normal, S2 normal, normal heart sounds and intact distal pulses.  Exam reveals no gallop and no friction rub.   No murmur heard. Pulmonary/Chest: Effort normal and breath sounds normal. No respiratory distress. He has no wheezes. He has no rales. He exhibits no tenderness.  Abdominal: Soft. Bowel sounds are normal. He exhibits no distension. There is no tenderness.  Musculoskeletal: Normal range of motion. He exhibits no edema or tenderness.  Lymphadenopathy:    He has no cervical adenopathy.  Neurological: He is alert and oriented to person,  place, and time. Coordination normal.  Skin: Skin is warm and dry. No rash noted. No erythema.  Psychiatric: He has a normal mood and affect. His behavior is normal. Judgment and thought content normal.      Assessment and Plan   Nursing note and vitals reviewed.

## 2015-08-09 NOTE — Assessment & Plan Note (Signed)
We have provided an order form to have fasting lab work drawn

## 2015-08-09 NOTE — Assessment & Plan Note (Signed)
Currently with no symptoms of angina. No further workup at this time. Continue current medication regimen. 

## 2015-08-09 NOTE — Assessment & Plan Note (Signed)
Continues to stay on aggressive regiment, followed by oncology,  Scan showing sacral fracture, continued detection of cancer

## 2015-08-09 NOTE — Assessment & Plan Note (Signed)
Blood pressure is well controlled on today's visit. No changes made to the medications. 

## 2015-08-09 NOTE — Assessment & Plan Note (Signed)
We have encouraged continued exercise, careful diet management in an effort to lose weight. 

## 2015-08-11 ENCOUNTER — Telehealth: Payer: Self-pay | Admitting: *Deleted

## 2015-08-11 ENCOUNTER — Other Ambulatory Visit: Payer: Self-pay | Admitting: *Deleted

## 2015-08-11 MED ORDER — DEXAMETHASONE 4 MG PO TABS: 4.0000 mg | ORAL_TABLET | Freq: Two times a day (BID) | ORAL | Status: DC

## 2015-08-11 MED ORDER — OXYCODONE-ACETAMINOPHEN 5-325 MG PO TABS: 1.0000 | ORAL_TABLET | Freq: Three times a day (TID) | ORAL | Status: DC | PRN

## 2015-08-11 NOTE — Telephone Encounter (Signed)
Called patient.  Let him know that percocet script is ready for pick up and he said he will come this afternoon.  Also let him know that decadron has been sent to [facility] in [place].  Reviewed directions with him, reminded him that he needs to take this with food and monitor his blood sugars closely as the decadron will increase his blood sugar.  Also let him know that we will add appointment to see Dr. Burr Medico on 08/18/15 and Dr. Burr Medico will request referrral with Dr. Sondra Come.  He appreciated all of this.

## 2015-08-11 NOTE — Telephone Encounter (Signed)
Patient called.  He has had a severe increase in pain since he saw Dr. Burr Medico on 08/04/15.  He has back pain that he rates a 7 or 8 out of 10.  He also is having difficulty walking. He is wobbling.  He has no bowel or urinary sx.   He is due a scan in April. Discussed with Dr. Burr Medico.  He has two options.  He can go to the [facility] ED and be evaluated and possibly have his scan sooner or Dr. Burr Medico can give his decadron and percocet and refer him to Dr. Sondra Come for evaluation. Discussed with patient.  He would prefer to try the second option.  His pharmacy is [facility] in [place].  He knows he will need to come here to pick up the script for percocet.  He will be glad to see Dr. Sondra Come.  He is also wondering if he can see Dr. Burr Medico on February 23rd when he comes for his next infusion.  Will check with Dr. Burr Medico

## 2015-08-12 ENCOUNTER — Telehealth: Payer: Self-pay | Admitting: Hematology

## 2015-08-12 NOTE — Telephone Encounter (Signed)
Spoke with pt to confirm appt for 2/22 per 2/16 pof

## 2015-08-15 ENCOUNTER — Telehealth: Payer: Self-pay | Admitting: Hematology

## 2015-08-15 ENCOUNTER — Other Ambulatory Visit: Payer: Self-pay | Admitting: *Deleted

## 2015-08-15 NOTE — Telephone Encounter (Signed)
Spoke with pt to inform him of changes to his 2/23 appt times per 2/20 pof

## 2015-08-16 ENCOUNTER — Other Ambulatory Visit: Payer: Self-pay | Admitting: Cardiovascular Disease

## 2015-08-16 NOTE — Progress Notes (Signed)
Histology and Location of Primary Cancer:Recurrent anal cancer   Location(s) of Symptomatic Metastases: PET scan from 07/07/15 showed  "Lesion now extends across the sacroiliac joint to involve the posterior right iliac bone. This measures 8.3 cm and has an SUV max equal to 17.3.   Past/Anticipated chemotherapy by medical oncology, if any: 06/28/2014 - 09/25/2014 Chemotherapy 5-FU 1078m/m2/day on D1-5, ciplatin 1046mm2 on D2, every 28 days, s/p 4 cycles        04/15/2015 -  Chemotherapy Nivolumab 240 mg every 2 weeks       Pain on a scale of 0-10 is: 8 - takes Percocet 1-2 times per day.   If Spine Met(s), symptoms, if any, include:  Bowel/Bladder retention or incontinence (please describe): denies bowel/bladder issues.  He has a colostomy.   Numbness or weakness in extremities (please describe): reports weakness in his feet from diabetes.  Current Decadron regimen, if applicable: 4 mg BID  Ambulatory status? Walker? Wheelchair?: ambulatory   SAFETY ISSUES:  Prior radiation? 01/2011 definitive dose RT 45 Gy over 5 weeks with 16Gy boosting  Pacemaker/ICD? no  Possible current pregnancy? no  Is the patient on methotrexate? no  Current Complaints / other details:  Reports pain worsened 2 weeks ago.  The pain is across his lower back/sacral area.  He reports it is Ok when he is sitting and is worse with standing.  He said he "walks like a duck" due to keeping his back stiff.  He is taking percocet but is concerned about constipation.  BP 135/91 mmHg  Pulse 78  Temp(Src) 98.3 F (36.8 C) (Oral)  Resp 16  Ht 6' (1.829 m)  Wt 209 lb 8 oz (95.029 kg)  BMI 28.41 kg/m2

## 2015-08-17 ENCOUNTER — Ambulatory Visit
Admission: RE | Admit: 2015-08-17 | Discharge: 2015-08-17 | Disposition: A | Discharge status: Home / Self Care | Payer: Managed Care, Other (non HMO) | Source: Ambulatory Visit | Attending: Radiation Oncology | Admitting: Radiation Oncology

## 2015-08-17 ENCOUNTER — Ambulatory Visit: Payer: Managed Care, Other (non HMO) | Admitting: Hematology

## 2015-08-17 ENCOUNTER — Encounter: Payer: Self-pay | Admitting: Radiation Oncology

## 2015-08-17 ENCOUNTER — Encounter: Payer: Self-pay | Admitting: Cardiovascular Disease

## 2015-08-17 VITALS — BP 135/91 | HR 78 | Temp 98.3°F | Resp 16 | Ht 72.0 in | Wt 209.5 lb

## 2015-08-17 DIAGNOSIS — C211 Malignant neoplasm of anal canal: Secondary | ICD-10-CM | POA: Diagnosis not present

## 2015-08-17 DIAGNOSIS — C210 Malignant neoplasm of anus, unspecified: Secondary | ICD-10-CM

## 2015-08-17 DIAGNOSIS — Z923 Personal history of irradiation: Secondary | ICD-10-CM | POA: Diagnosis not present

## 2015-08-17 DIAGNOSIS — C7951 Secondary malignant neoplasm of bone: Secondary | ICD-10-CM | POA: Diagnosis present

## 2015-08-17 LAB — HEPATIC FUNCTION PANEL
ALBUMIN: 4.3 g/dL (ref 3.5–5.5)
ALK PHOS: 149 IU/L — AB (ref 39–117)
ALT: 32 IU/L (ref 0–44)
AST: 36 IU/L (ref 0–40)
Bilirubin Total: 0.3 mg/dL (ref 0.0–1.2)
Bilirubin, Direct: 0.1 mg/dL (ref 0.00–0.40)
Total Protein: 7.6 g/dL (ref 6.0–8.5)

## 2015-08-17 LAB — LIPID PANEL W/O CHOL/HDL RATIO
Cholesterol, Total: 148 mg/dL (ref 100–199)
HDL: 47 mg/dL (ref >39)
LDL Calculated: 53 mg/dL (ref 0–99)
Triglycerides: 241 mg/dL — ABNORMAL HIGH (ref 0–149)
VLDL Cholesterol Cal: 48 mg/dL — ABNORMAL HIGH (ref 5–40)

## 2015-08-17 NOTE — Addendum Note (Signed)
Encounter addended by: Jacqulyn Liner, RN on: 08/17/2015 12:18 PM<BR>     Documentation filed: Charges VN

## 2015-08-17 NOTE — Progress Notes (Signed)
Please see the Nurse Progress Note in the MD Initial Consult Encounter for this patient. 

## 2015-08-17 NOTE — Progress Notes (Signed)
Radiation Oncology         [phone] ________________________________  Reevaluation note  Name: Julian Palmer MRN: [id number]  Date: 08/17/2015  DOB: 02/06/1961  [id number], DAVID, MD  Truitt Merle, MD   REFERRING PHYSICIAN: Truitt Merle, MD  DIAGNOSIS: Recurrent anal carcinoma  HISTORY OF PRESENT ILLNESS::Julian Palmer is a 55 y.o. male who presents with recurrent anal cancer. He had a PET scan 07/07/2015, revealing a lesion that now extends across the sacroiliac joint to involve the posterior right iliac bone that measured 8.3 cm and has an SUV max equal to 17.3. He received chemotherapy 06/28/2014-09/25/2014 and is currently taking Nivolumab that was started 04/15/2015. His pain was doing better, but the past two weeks his pain has got worse. He is taking Oxycodone but it is causing constipation which is difficult with the colostomy. When he sits his pain is okay, when he walks he has pain and feels like he is "walking like an old man". He denies numbness or weakness in legs. He has difficulty turning on his side when he is lying down.  PREVIOUS RADIATION THERAPY: Yes. He received a definitive dose of radiation therapy of 45 Gy over 5 weeks with 16 Gy boosting completed 01/2011 under the direction of Dr. Baruch Gouty at [facility]:  has a past medical history of S/p bare metal coronary artery stent; Hypertension; History of rectal cancer; Hyperlipidemia; History of carcinoma in situ of anal canal; ED (erectile dysfunction); Sigmoid diverticulosis; PONV (postoperative nausea and vomiting); Wears glasses; At risk for sleep apnea; Coronary artery disease (CARDIOLOGIST--  DR Rockey Situ --  LAST LOV 04-17-2013); Anxiety; Depression; HIV positive ([place]); Type 2 diabetes mellitus ([place]); Radiation (01/2011); Recurrent squamous cell carcinoma of anus ([place]); and Primary cancer of anal canal ([place]) (05/18/2011).    PAST SURGICAL HISTORY: Past Surgical History  Procedure  Laterality Date  . Septoplasty  1985  . Sphincterotomy  2009  . Cardiovascular stress test  04/ 2010    low risk study/  very small region of mildly reduced perfersion is mildly reversible dLAD territory  . Resection anal mass  02-05-2011  . Excision anal polyp  2009  . Re-excision anal squamous cell carcinoma  2010  . Removal rectal mass  01-08-2014  . Eua w/ bx perianal skin  06-08-2009  . Port-a-cath placement  02-21-2011    REMOVED  2013  . Coronary angioplasty with stent placement  1998   [facility]    BM stent to pLAD and PCI to Mid and Distal LAD/  no other significant cad  . Inguinal hernia repair Bilateral 1988  . Colonoscopy with propofol  05-28-2008  . Mass excision N/A 03/26/2014    Procedure: ANAL EXAM UNDER ANESTHESIA,EXCISIONAL BIOPSY OF ANAL MASS;  Surgeon: Leighton Ruff, MD;  Location: [facility];  Service: General;  Laterality: N/A;  . Laparoscopic assisted abdominal perineal resection N/A 04/22/2014    Procedure: LAPAROSCOPIC  ABDOMINAL PERINEAL RESECTION, ;  Surgeon: Leighton Ruff, MD;  Location[facility];  Service: General;  Laterality: N/A;  . Urethrotomy Bilateral 04/22/2014    Procedure: CYSTOSCOPY/URETHROTOMY;  Surgeon: Leighton Ruff, MD;  Location[facility];  Service: General;  Laterality: Bilateral;  . Muscle flap closure N/A 04/22/2014    Procedure: VRAM FLAP;  Surgeon: Irene Limbo, MD;  Location: [facility];  Service: Plastics;  Laterality: N/A;  . Portacath placement Left 06/24/2014    Procedure: INSERTION PORT-A-CATH LEFT SUBCLAVIAN;  Surgeon: Leighton Ruff, MD;  Location:  [facility];  Service: General;  Laterality: Left;    FAMILY HISTORY: family history includes Breast cancer in his mother; Coronary artery disease in his father; Diabetes type II in his mother; Hypertension in his mother; Uterine cancer in his paternal grandmother.  SOCIAL HISTORY:  reports that he has never smoked. He has never used smokeless tobacco. He reports that he does not drink  alcohol or use illicit drugs.  ALLERGIES: Sulfa antibiotics  MEDICATIONS:  Current Outpatient Prescriptions  Medication Sig Dispense Refill  . aspirin (ASPIR-81) 81 MG EC tablet Take 81 mg by mouth at bedtime.     Marland Kitchen atorvastatin (LIPITOR) 80 MG tablet Take 1 tablet (80 mg total) by mouth every evening. 90 tablet 4  . dexamethasone (DECADRON) 4 MG tablet Take 1 tablet (4 mg total) by mouth 2 (two) times daily with a meal. Take with food. Monitor blood sugars 14 tablet 0  . efavirenz-emtrictabine-tenofovir (ATRIPLA) 600-200-300 MG per tablet Take 1 tablet by mouth at bedtime.     . Empagliflozin-Linagliptin (GLYXAMBI) 25-5 MG TABS Take 1 tablet by mouth daily.    Marland Kitchen glimepiride (AMARYL) 2 MG tablet Take 4 mg by mouth daily with breakfast.     . losartan (COZAAR) 100 MG tablet Take 1 tablet (100 mg total) by mouth daily. 90 tablet 4  . metFORMIN (GLUCOPHAGE) 1000 MG tablet Take 1,000 mg by mouth 2 (two) times daily.      . Multiple Vitamins-Minerals (MULTIVITAL) tablet Take 1 tablet by mouth every evening.     Marland Kitchen oxyCODONE-acetaminophen (PERCOCET/ROXICET) 5-325 MG tablet Take 1 tablet by mouth every 8 (eight) hours as needed for moderate pain or severe pain. 30 tablet 0  . citalopram (CELEXA) 40 MG tablet Take 40 mg by mouth every morning. Reported on 08/17/2015    . lidocaine-prilocaine (EMLA) cream Apply topically as needed. Reported on 08/17/2015     No current facility-administered medications for this encounter.   Facility-Administered Medications Ordered in Other Encounters  Medication Dose Route Frequency Provider Last Rate Last Dose  . heparin lock flush 100 unit/mL  500 Units Intravenous Once Truitt Merle, MD      . heparin lock flush 100 unit/mL  500 Units Intracatheter Once PRN Truitt Merle, MD      . sodium chloride 0.9 % injection 10 mL  10 mL Intravenous PRN Truitt Merle, MD      . sodium chloride 0.9 % injection 10 mL  10 mL Intracatheter PRN Truitt Merle, MD        REVIEW OF SYSTEMS:  A 15  point review of systems is documented in the electronic medical record. This was obtained by the nursing staff. However, I reviewed this with the patient to discuss relevant findings and make appropriate changes.  Pertinent items are noted in HPI.   PHYSICAL EXAM:  height is 6' (1.829 m) and weight is 209 lb 8 oz (95.029 kg). His oral temperature is 98.3 F (36.8 C). His blood pressure is 135/91 and his pulse is 78. His respiration is 16.  General: Alert and oriented, in no acute distress HEENT: Head is normocephalic. Extraocular movements are intact. Oropharynx is clear. Neck: Neck is supple, no palpable cervical or supraclavicular lymphadenopathy. Heart: Regular in rate and rhythm with no murmurs, rubs, or gallops. Chest: Clear to auscultation bilaterally, with no rhonchi, wheezes, or rales. Musculoskeletal: symmetric strength and muscle tone throughout. He has pain in his right sacral area when he raises his right knee.  Psychiatric: Judgment and insight  are intact. Affect is appropriate.  ECOG = 2  LABORATORY DATA:  Lab Results  Component Value Date   WBC 7.4 08/04/2015   HGB 12.0* 08/04/2015   HCT 36.2* 08/04/2015   MCV 88.6 08/04/2015   PLT 241 08/04/2015   NEUTROABS 5.5 08/04/2015   Lab Results  Component Value Date   NA 137 08/04/2015   K 4.2 08/04/2015   CL 102 04/25/2014   CO2 20* 08/04/2015   GLUCOSE 211* 08/04/2015   CREATININE 1.4* 08/04/2015   CALCIUM 9.2 08/04/2015      RADIOGRAPHY: No results found.  IMPRESSION: Mr. Fluegel is a 55 yo male with recurrent anal cancer. He is a good candidate for external radiation. Unfortunately the patient's pain is worsening with his current therapy limiting his options. He understands that with retreatment using radiation therapy is it higher risk for complications, but given his level of pain  is willing to accept this risk. I would recommend IMRT given previous radiation therapy to this area. This will help limit dose to normal  surrounding critical structures.  PLAN: We talked about IMRT and it's side effects. We talked about it's use for palliation for the pain he has been having. This procedure has been fully reviewed with the patient and written informed consent has been obtained. His planning appointment is scheduled 08/23/2015 at 9 am.    ------------------------------------------------  Blair Promise, PhD, MD    This document serves as a record of services personally performed by Gery Pray, MD. It was created on his behalf by Lendon Collar, a trained medical scribe. The creation of this record is based on the scribe's personal observations and the provider's statements to them. This document has been checked and approved by the attending provider.

## 2015-08-18 ENCOUNTER — Ambulatory Visit (HOSPITAL_BASED_OUTPATIENT_CLINIC_OR_DEPARTMENT_OTHER): Payer: Managed Care, Other (non HMO) | Admitting: Hematology

## 2015-08-18 ENCOUNTER — Encounter: Payer: Self-pay | Admitting: Hematology

## 2015-08-18 ENCOUNTER — Other Ambulatory Visit (HOSPITAL_BASED_OUTPATIENT_CLINIC_OR_DEPARTMENT_OTHER): Payer: Managed Care, Other (non HMO)

## 2015-08-18 ENCOUNTER — Ambulatory Visit: Payer: Managed Care, Other (non HMO)

## 2015-08-18 ENCOUNTER — Telehealth: Payer: Self-pay | Admitting: Hematology

## 2015-08-18 ENCOUNTER — Ambulatory Visit (HOSPITAL_BASED_OUTPATIENT_CLINIC_OR_DEPARTMENT_OTHER): Payer: Managed Care, Other (non HMO)

## 2015-08-18 VITALS — BP 131/71 | HR 95 | Temp 98.0°F | Resp 16 | Ht 72.0 in | Wt 207.8 lb

## 2015-08-18 DIAGNOSIS — F329 Major depressive disorder, single episode, unspecified: Secondary | ICD-10-CM | POA: Diagnosis not present

## 2015-08-18 DIAGNOSIS — F32A Depression, unspecified: Secondary | ICD-10-CM

## 2015-08-18 DIAGNOSIS — IMO0001 Reserved for inherently not codable concepts without codable children: Secondary | ICD-10-CM

## 2015-08-18 DIAGNOSIS — C7951 Secondary malignant neoplasm of bone: Secondary | ICD-10-CM

## 2015-08-18 DIAGNOSIS — C211 Malignant neoplasm of anal canal: Secondary | ICD-10-CM

## 2015-08-18 DIAGNOSIS — I2510 Atherosclerotic heart disease of native coronary artery without angina pectoris: Secondary | ICD-10-CM | POA: Diagnosis not present

## 2015-08-18 DIAGNOSIS — Z5112 Encounter for antineoplastic immunotherapy: Secondary | ICD-10-CM | POA: Diagnosis not present

## 2015-08-18 DIAGNOSIS — E1165 Type 2 diabetes mellitus with hyperglycemia: Secondary | ICD-10-CM

## 2015-08-18 DIAGNOSIS — Z85048 Personal history of other malignant neoplasm of rectum, rectosigmoid junction, and anus: Secondary | ICD-10-CM

## 2015-08-18 DIAGNOSIS — Z794 Long term (current) use of insulin: Secondary | ICD-10-CM

## 2015-08-18 LAB — COMPREHENSIVE METABOLIC PANEL
ALT: 36 U/L (ref 0–55)
AST: 29 U/L (ref 5–34)
Albumin: 3.7 g/dL (ref 3.5–5.0)
Alkaline Phosphatase: 152 U/L — ABNORMAL HIGH (ref 40–150)
Anion Gap: 12 mEq/L — ABNORMAL HIGH (ref 3–11)
BUN: 30.8 mg/dL — AB (ref 7.0–26.0)
CALCIUM: 9.2 mg/dL (ref 8.4–10.4)
CHLORIDE: 101 meq/L (ref 98–109)
CO2: 21 meq/L — AB (ref 22–29)
Creatinine: 1.5 mg/dL — ABNORMAL HIGH (ref 0.7–1.3)
EGFR: 54 mL/min/{1.73_m2} — ABNORMAL LOW (ref >90)
Glucose: 200 mg/dl — ABNORMAL HIGH (ref 70–140)
POTASSIUM: 4.4 meq/L (ref 3.5–5.1)
SODIUM: 134 meq/L — AB (ref 136–145)
Total Bilirubin: <0.3 mg/dL (ref 0.20–1.20)
Total Protein: 8.4 g/dL — ABNORMAL HIGH (ref 6.4–8.3)

## 2015-08-18 LAB — CBC WITH DIFFERENTIAL/PLATELET
BASO%: 0.8 % (ref 0.0–2.0)
BASOS ABS: 0.1 10*3/uL (ref 0.0–0.1)
EOS%: 0.4 % (ref 0.0–7.0)
Eosinophils Absolute: 0 10*3/uL (ref 0.0–0.5)
HEMATOCRIT: 37.5 % — AB (ref 38.4–49.9)
HGB: 12.5 g/dL — ABNORMAL LOW (ref 13.0–17.1)
LYMPH%: 13.5 % — AB (ref 14.0–49.0)
MCH: 29.3 pg (ref 27.2–33.4)
MCHC: 33.4 g/dL (ref 32.0–36.0)
MCV: 87.8 fL (ref 79.3–98.0)
MONO#: 0.6 10*3/uL (ref 0.1–0.9)
MONO%: 7.3 % (ref 0.0–14.0)
NEUT#: 6.2 10*3/uL (ref 1.5–6.5)
NEUT%: 78 % — AB (ref 39.0–75.0)
Platelets: 257 10*3/uL (ref 140–400)
RBC: 4.27 10*6/uL (ref 4.20–5.82)
RDW: 14.5 % (ref 11.0–14.6)
WBC: 7.9 10*3/uL (ref 4.0–10.3)
lymph#: 1.1 10*3/uL (ref 0.9–3.3)

## 2015-08-18 MED ORDER — SODIUM CHLORIDE 0.9% FLUSH
10.0000 mL | INTRAVENOUS | Status: DC | PRN
Start: 2015-08-18 — End: 2015-08-18
  Administered 2015-08-18: 10 mL via INTRAVENOUS
  Filled 2015-08-18: qty 10

## 2015-08-18 MED ORDER — SODIUM CHLORIDE 0.9 % IV SOLN
Freq: Once | INTRAVENOUS | Status: AC
  Administered 2015-08-18: 14:00:00 via INTRAVENOUS

## 2015-08-18 MED ORDER — HYDROCODONE-ACETAMINOPHEN 5-325 MG PO TABS: 1.0000 | ORAL_TABLET | Freq: Three times a day (TID) | ORAL | Status: DC | PRN

## 2015-08-18 MED ORDER — SODIUM CHLORIDE 0.9 % IV SOLN
240.0000 mg | Freq: Once | INTRAVENOUS | Status: AC
  Administered 2015-08-18: 240 mg via INTRAVENOUS
  Filled 2015-08-18: qty 20

## 2015-08-18 MED ORDER — SODIUM CHLORIDE 0.9 % IJ SOLN
10.0000 mL | INTRAMUSCULAR | Status: DC | PRN
  Administered 2015-08-18: 10 mL
  Filled 2015-08-18: qty 10

## 2015-08-18 MED ORDER — HEPARIN SOD (PORK) LOCK FLUSH 100 UNIT/ML IV SOLN
500.0000 [IU] | Freq: Once | INTRAVENOUS | Status: DC
  Filled 2015-08-18: qty 5

## 2015-08-18 MED ORDER — HEPARIN SOD (PORK) LOCK FLUSH 100 UNIT/ML IV SOLN
500.0000 [IU] | Freq: Once | INTRAVENOUS | Status: AC | PRN
  Administered 2015-08-18: 500 [IU]
  Filled 2015-08-18: qty 5

## 2015-08-18 NOTE — Patient Instructions (Signed)

## 2015-08-18 NOTE — Telephone Encounter (Signed)
per pof to ch pt appt-pt to get updated copy b4 leaving trmt room

## 2015-08-18 NOTE — Progress Notes (Signed)
Julian Palmer NOTE  Patient Care Team: [name], MD as PCP - General (Infectious Diseases) Minna Merritts, MD as Consulting Physician (Cardiology) Robert Bellow, MD (General Surgery) Leighton Ruff, MD as Consulting Physician (General Surgery) Irene Limbo, MD as Consulting Physician (Plastic Surgery) Truitt Merle, MD as Consulting Physician (Hematology) Festus Aloe, MD as Consulting Physician (Urology) Gery Pray, MD as Consulting Physician (Radiation Oncology)  DIAGNOSIS Recurrent anal cancer    Primary cancer of anal canal ([place])   02/05/2011 Initial Diagnosis Primary cancer of anal canal, T1N0M0, s/p surgical resection with positive margines. He received definitive dose RT 45 Gy over 5 weeks with 16Gy boosting and concurrent chemo with 5FU, treated at [facility] by Drs.  Geoffreyh and [employer].    11/23/2013 Relapse/Recurrence local recurrence    04/21/2014 Surgery abdominal perianal resection (APR) and VRAM flap closure of perineal wound on 04/21/14, surgical margins were positive for cancer    06/28/2014 - 09/25/2014 Chemotherapy 5-FU 1000mg /m2/day on D1-5, ciplatin 100mg /m2 on D2, every 28 days, s/p 4 cycles    10/11/2014 Progression CT CAP showed new liver lesions and abd/pelc adenopathy, highly suspecious for progressive metastatic disease    11/02/2014 Imaging abdomen MRI w wo contrast showed new small lesions in the right hepatic lobe with nonspecific enhancement, and enlarging nodes in the porta hepatis, metastatic disease not excluded    02/24/2015 Imaging PET scan showed hypermetabolic bone mets in right sacrum, and small hypermetabolic retroperitoneal lymph nodes, no other abnormal metabolic activity within the liver or other place.   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   04/15/2015 -  Chemotherapy Nivolumab 240 mg every 2 weeks     OTHER RELATED ISSUES: 1. (+) HIV, on therapy  with good control  2. CAD, s/p stent placement in [place]: Nivolumab 240mg  iv every 2 weeks, started on 04/15/2015  INTERIM HISTORY: Metthew returns for follow up and nivo treatment. He developed worsening sacral pain a week ago, it radiates to his right side some time. The pain is quite severe when he walks, but much less when he sits in a comfortable position. I called in dexa for him and he took for a week but did not feel much improvement with dexamethasone. He still has the Percocet I prescribed for him before and takes a few tabs a day. He is concerned about constipation from narcotics.    MEDICAL HISTORY:  Past Medical History  Diagnosis Date  . S/p bare metal coronary artery stent     1998  pLAD  . Hypertension   . 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 Hyperlipidemia   . History of carcinoma in situ of anal canal     2010  . ED (erectile dysfunction)   . Sigmoid diverticulosis   . PONV (postoperative nausea and vomiting)   . Wears glasses   . At risk for sleep apnea     STOP-BANG= 4     SENT TO PCP 03-23-2014  . Coronary artery disease CARDIOLOGIST--  DR Rockey Situ --  LAST LOV 04-17-2013    S/P  PCI to mid and distal LAD and BM stent x1 to pLAD  . Anxiety   . Depression   . HIV positive ([place])   . Type 2 diabetes  mellitus ([place])   . Radiation 01/2011    definitive dose RT 45 Gy over 5 weeks with 16Gy boosting  . 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 Primary cancer of anal canal ([place]) 05/18/2011    SURGICAL HISTORY: Past Surgical History  Procedure Laterality Date  . Septoplasty  1985  . Sphincterotomy  2009  . Cardiovascular stress test  04/ 2010    low risk study/  very small region of mildly reduced perfersion is mildly reversible dLAD territory  . Resection anal  mass  02-05-2011  . Excision anal polyp  2009  . Re-excision anal squamous cell carcinoma  2010  . Removal rectal mass  01-08-2014  . Eua w/ bx perianal skin  06-08-2009  . Port-a-cath placement  02-21-2011    REMOVED  2013  . Coronary angioplasty with stent placement  1998   [facility]    BM stent to pLAD and PCI to Mid and Distal LAD/  no other significant cad  . Inguinal hernia repair Bilateral 1988  . Colonoscopy with propofol  05-28-2008  . Mass excision N/A 03/26/2014    Procedure: ANAL EXAM UNDER ANESTHESIA,EXCISIONAL BIOPSY OF ANAL MASS;  Surgeon: Leighton Ruff, MD;  Location: [facility];  Service: General;  Laterality: N/A;  . Laparoscopic assisted abdominal perineal resection N/A 04/22/2014    Procedure: LAPAROSCOPIC  ABDOMINAL PERINEAL RESECTION, ;  Surgeon: Leighton Ruff, MD;  Location: [facility];  Service: General;  Laterality: N/A;  . Urethrotomy Bilateral 04/22/2014    Procedure: CYSTOSCOPY/URETHROTOMY;  Surgeon: Leighton Ruff, MD;  Location: [facility];  Service: General;  Laterality: Bilateral;  . Muscle flap closure N/A 04/22/2014    Procedure: VRAM FLAP;  Surgeon: Irene Limbo, MD;  Location: [facility];  Service: Plastics;  Laterality: N/A;  . Portacath placement Left 06/24/2014    Procedure: INSERTION PORT-A-CATH LEFT SUBCLAVIAN;  Surgeon: Leighton Ruff, MD;  Location: [facility];  Service: General;  Laterality: Left;    SOCIAL HISTORY: Social History   Social History  . Marital Status: Single    Spouse Name: N/A  . Number of Children: 0  . Years of Education: N/A   Occupational History  .  [place] History Main Topics  . Smoking status: Never Smoker   . Smokeless tobacco: Never Used  . Alcohol Use: No     Comment: RARE  . Drug Use: No  . Sexual Activity: Not on file   Other Topics Concern  . Not on file   Social History Narrative   Full time. Single. Does not regularly exercise.     FAMILY HISTORY: Family History  Problem  Relation Age of Onset  . Diabetes type II Mother   . Breast cancer Mother 64     survivor  . Hypertension Mother   . Coronary artery disease Father   Paternal GM had utrine cancer, no other history of malignancy   ALLERGIES:  is allergic to sulfa antibiotics.  MEDICATIONS:  Current Outpatient Prescriptions  Medication Sig Dispense Refill  . aspirin (ASPIR-81) 81 MG EC tablet Take 81 mg by mouth at bedtime.     Marland Kitchen atorvastatin (LIPITOR) 80 MG tablet Take 1 tablet (80 mg total) by mouth every evening. 90 tablet 4  . citalopram (CELEXA) 40 MG tablet Take 40 mg by mouth every morning. Reported on 08/17/2015    . dexamethasone (DECADRON) 4 MG tablet Take 1 tablet (4 mg total) by mouth  2 (two) times daily with a meal. Take with food. Monitor blood sugars 14 tablet 0  . efavirenz-emtrictabine-tenofovir (ATRIPLA) 600-200-300 MG per tablet Take 1 tablet by mouth at bedtime.     . Empagliflozin-Linagliptin (GLYXAMBI) 25-5 MG TABS Take 1 tablet by mouth daily.    Marland Kitchen glimepiride (AMARYL) 2 MG tablet Take 4 mg by mouth daily with breakfast.     . lidocaine-prilocaine (EMLA) cream Apply topically as needed. Reported on 08/17/2015    . losartan (COZAAR) 100 MG tablet Take 1 tablet (100 mg total) by mouth daily. 90 tablet 4  . metFORMIN (GLUCOPHAGE) 1000 MG tablet Take 1,000 mg by mouth 2 (two) times daily.      . Multiple Vitamins-Minerals (MULTIVITAL) tablet Take 1 tablet by mouth every evening.     Marland Kitchen oxyCODONE-acetaminophen (PERCOCET/ROXICET) 5-325 MG tablet Take 1 tablet by mouth every 8 (eight) hours as needed for moderate pain or severe pain. 30 tablet 0  . HYDROcodone-acetaminophen (NORCO/VICODIN) 5-325 MG tablet Take 1 tablet by mouth every 8 (eight) hours as needed for moderate pain. 60 tablet 0   No current facility-administered medications for this visit.   Facility-Administered Medications Ordered in Other Visits  Medication Dose Route Frequency Provider Last Rate Last Dose  . heparin lock  flush 100 unit/mL  500 Units Intravenous Once Truitt Merle, MD      . heparin lock flush 100 unit/mL  500 Units Intracatheter Once PRN Truitt Merle, MD      . sodium chloride 0.9 % injection 10 mL  10 mL Intravenous PRN Truitt Merle, MD      . sodium chloride 0.9 % injection 10 mL  10 mL Intracatheter PRN Truitt Merle, MD      . sodium chloride 0.9 % injection 10 mL  10 mL Intracatheter PRN Truitt Merle, MD   10 mL at 08/18/15 1533    REVIEW OF SYSTEMS:   Constitutional: Denies fevers, chills or abnormal night sweats Eyes: Denies blurriness of vision, double vision or watery eyes Ears, nose, mouth, throat, and face: Denies mucositis or sore throat Respiratory: Denies cough, dyspnea or wheezes Cardiovascular: Denies palpitation, chest discomfort or lower extremity swelling Gastrointestinal:  Denies nausea, heartburn or change in bowel habits, (+) colostomy bag Skin: Mild skin pigmentation from his previous rash. Lymphatics: Denies new lymphadenopathy or easy bruising Neurological:Denies numbness, tingling or new weaknesses Behavioral/Psych: Mood is stable, no new changes  All other systems were reviewed with the patient and are negative.  PHYSICAL EXAMINATION: ECOG PERFORMANCE STATUS: 0  Filed Vitals:   08/18/15 1315  BP: 131/71  Pulse: 95  Temp: 98 F (36.7 C)  Resp: 16   Filed Weights   08/18/15 1315  Weight: 207 lb 12.8 oz (94.257 kg)    GENERAL:alert, no distress, sits in a wheelchair comfortably SKIN: skin color, texture, turgor are normal, healed rashes on her arms, legs, and trunk with skin pigmentation.  EYES: normal, conjunctiva are pink and non-injected, sclera clear OROPHARYNX:no exudate, no erythema and lips, buccal mucosa, and tongue normal  NECK: supple, thyroid normal size, non-tender, without nodularity LYMPH:  no palpable lymphadenopathy in the cervical, axillary or inguinal LUNGS: clear to auscultation and percussion with normal breathing effort HEART: regular rate & rhythm and  no murmurs and no lower extremity edema ABDOMEN:abdomen soft, non-tender and normal bowel sounds. (+) Colostomy bag. The perianal reconstruction site is completely healed.  Musculoskeletal:no cyanosis of digits and no clubbing  PSYCH: alert & oriented x 3 with fluent speech NEURO:  no focal motor/sensory deficits SKIN: A few skin pigmentation from his previous rash  LABORATORY DATA:  I have reviewed the data as listed CBC Latest Ref Rng 08/18/2015 08/04/2015 07/21/2015  WBC 4.0 - 10.3 10e3/uL 7.9 7.4 4.1  Hemoglobin 13.0 - 17.1 g/dL 12.5(L) 12.0(L) 11.4(L)  Hematocrit 38.4 - 49.9 % 37.5(L) 36.2(L) 33.5(L)  Platelets 140 - 400 10e3/uL 257 241 209     Recent Labs  07/21/15 1409 08/04/15 1338 08/16/15 0707 08/18/15 1136  NA 138 137  --  134*  K 4.2 4.2  --  4.4  CO2 26 20*  --  21*  GLUCOSE 96 211*  --  200*  BUN 21.9 28.6*  --  30.8*  CREATININE 1.3 1.4*  --  1.5*  CALCIUM 9.4 9.2  --  9.2  PROT 7.7 8.2 7.6 8.4*  ALBUMIN 3.6 3.6 4.3 3.7  AST 28 33 36 29  ALT 26 33 32 36  ALKPHOS 121 138 149* 152*  BILITOT <0.30 <0.30 0.3 <0.30  BILIDIR  --   --  0.10  --      PATHOLOGY REPORT 04/22/2014 Diagnosis 1. Soft tissue, biopsy, right pelvic sidewall; r/o malignancy - FIBROADIPOSE TISSUE, NO EVIDENCE OF MALIGNANCY. 2. Colon, segmental resection for tumor, rectum,sigmoid and anus - RECURRENT INVASIVE POORLY DIFFERENTIATED SQUAMOUS CELL CARCINOMA, INVADING THROUGH THE MUSCULARIS PROPRIA, INTO PERICOLONIC FATTY TISSUE, EXTENDING TO THE DEEP INKED MARGIN. - PERINEURAL INVASION AND ANGIOLYMPHATIC PRESENT. - SEVEN OF SEVENTEEN LYMPH NODES, POSITIVE FOR METASTATIC CARCINOMA (7/17) Microscopic Comment 2. Anus Specimen: Anus, rectum, sigmoid Procedure: Segmental resection Tumor site: Distal ends of specimen Specimen integrity: Intact Invasive tumor: Maximum size: At least 2.5 cm Histologic type(s): Invasive squamous cell carcinoma Histologic grade and differentiation: G2-3, moderately  to poorly differentiated Microscopic extension of invasive tumor: Invading through the muscularis propria into pericolonic fatty tissue involving adjacent skeletal muscle tissue Lymph-Vascular invasion: Present Peri-neural invasion: Present Resection margins: Deep margin is positive for tumor Treatment effect (neo-adjuvant therapy): Present, minimally Lymph nodes: number examined 17; number positive: 7 Pathologic Staging: ypT2, yN1 (R1) Ancillary studies: N/A  Diagnosis 03/15/2015 Bone, biopsy, caudal left side of the sacrum - METASTATIC SQUAMOUS CELL CARCINOMA, SEE COMMENT. Microscopic Comment Biopsies demonstrate extensive involvement by metastatic focally keratinizing, well to moderately differentiated squamous cell carcinoma. The previous anorectal resection demonstrating invasive squamous cell carcinoma is noted [id number]). (CRR:ecj 03/16/2015)  RADIOGRAPHIC STUDIES: I have personally reviewed the radiological images as listed and agreed with the findings in the report.  Pet 02/24/2015  IMPRESSION: 1. Dominant finding is new hypermetabolic skeletal metastasis in the RIGHT sacrum. 2. Small hypermetabolic retroperitoneal periaortic and periportal lymph nodes consistent with nodal metastasis to retroperitoneum. 3. No abnormal metabolic activity within the liver to suggest metastasis. Lesions on comparison MRI were very small which could limit PET accuracy.  PET 07/07/2015 IMPRESSION: 1. Compared with the previous study there has been interval progression of lytic metastasis involving the right side of sacrum. This now crosses the sacroiliac joint and involves the posterior right iliac bone. There is now a pathologic fracture involving the right sacral wing. 2. Improvement an retroperitoneal lymph node metastasis. 3. Small nonspecific nodule in the left upper lobe measures 7 mm and appears new from previous exam. 4. Aortic atherosclerosis and multi vessel coronary  artery calcification 5. Gallstones.  ASSESSMENT & PLAN:  55 years old male with past medical history of anal squamous cell carcinoma, status post surgical resection and concurrent chemoradiation in 2009, now has local recurrence status post  APR and  reconstruction.ypT2, yN1 (R1), unfortunately his deep surgical margin was positive, and 7 out of 17 lymph nodes were positive also. No distant metastasis on the preop PET scan. His medical history is also complicated by diabetes, coronary artery disease status post stent placement, and HIV on treatment.   1. Local recurrence of anal squamous cell carcinoma, with positive surgical margines, biopsy proven sacral bone mets on 03/15/2015 -His recent PET scan from 02/24/2015 showed hypermetabolic skeletal metastasis in the right sacrum, and a few small hypermetabolic retroperitoneal lymph nodes, liver tissue was non-hypermetabolic, no other distant metastasis. -I discussed his sacral bone biopsy results, unfortunately he confirmed metastasis. -Patient does not want more chemotherapy. I discussed the clinical trial of immunotherapy new follow him at in metastatic and no cancer, the data was present in ASCO 2016. It is small study, a total of 37 patient's, showed partial response rate about 20% and overall disease control rate about 80%. -He is tolerating Nivo very well, with excellent clinical response, his sacral pain resolved after treatment  -I discussed the restaging PET scan from 07/07/2015 and reviewed the image in person with patient. The scan showed mixed response. The sacral bone metastases has increased metabolic activities, but the retroperitoneal lymph nodes has decreased. The prior scan was done one month before he started treatment, so the worsening sacral bone metastases could be related to that or possible pseudo-progression from [facility]. -Given his clinically much worse sacral pain, I'm concerned about disease progression. I have referred him to see  radiation oncologist Dr. Sondra Come, and he is scheduled to have simulation next week, he will have S/P RT 15 fractions.  -I discussed with Dr. Sondra Come today, he is okay for me to proceed with Nivo today, but hold it during the radiation. -A painter repeat a CT scan after he completes radiation. -We discussed other treatment options if he progresses on Nivo, which will likely be clinical trial. I also recommend to do Foundation one to normal testing, to see if his tumor contains a targetable mutations. The cost and reimbursement issue of the test were explained to the patient and he agrees to proceed. -I'll check to see if there is a good clinical trial for anal cancer at [facility], [facility] or [facility].  2. Sacral pain -probably related to his sacral bone mets, vs from the pathological fracture involving the right sacral wing, which was found on his PET/CT on 04/05/2016 -I spoke with Dr. Sondra Come, he will take a look at the simulation CT, to decide if he needs to see her orthopedic surgeon for the fracture. -His beta rectum is take oxycodone due to the concern of constipation. I given him a prescription of Vicodin, he may fill it in a few weeks to see if his pain can be controlled by Vicodin   2. Diabetes -controlled recently  -continue metformin and glimepiride, follow up with his endocrinologist   3. HIV  -Well controlled. Continue his treatment . His recent CD4 count was normal.   4. Hypertension and coronary artery disease  -He will continue follow-up with his primary care physician and cardiologist. Continue medications.   6. depression -improved since he started treatment lately.   Plan - Nivo today, and will hold it after today's dose until he completes his radiation  -I'll see him back in 4 weeks -will order a restaging CT scan on his next visit  -will request Foundation One    All questions were answered. The patient knows to call the clinic with  any problems, questions or concerns.   I spent 30  minutes counseling the patient face to face. The total time spent in the appointment was 40 minutes and more than 50% was on counseling.     Truitt Merle, MD 08/18/2015

## 2015-08-18 NOTE — Patient Instructions (Signed)
[facility] Discharge Instructions for Patients Receiving Chemotherapy  Today you received the following chemotherapy agents nivolumab   To help prevent nausea and vomiting after your treatment, we encourage you to take your nausea medication as directed   If you develop nausea and vomiting that is not controlled by your nausea medication, call the clinic.   BELOW ARE SYMPTOMS THAT SHOULD BE REPORTED IMMEDIATELY:  *FEVER GREATER THAN 100.5 F  *CHILLS WITH OR WITHOUT FEVER  NAUSEA AND VOMITING THAT IS NOT CONTROLLED WITH YOUR NAUSEA MEDICATION  *UNUSUAL SHORTNESS OF BREATH  *UNUSUAL BRUISING OR BLEEDING  TENDERNESS IN MOUTH AND THROAT WITH OR WITHOUT PRESENCE OF ULCERS  *URINARY PROBLEMS  *BOWEL PROBLEMS  UNUSUAL RASH Items with * indicate a potential emergency and should be followed up as soon as possible.  Feel free to call the clinic you have any questions or concerns. The clinic phone number is [phone].  

## 2015-08-22 ENCOUNTER — Telehealth: Payer: Self-pay | Admitting: *Deleted

## 2015-08-22 NOTE — Telephone Encounter (Signed)
Spoke with Maudie Mercury @ [facility] dept and requested Foundation One Test for pt -  Accession  #  [id number]  As per Dr. Ernestina Penna instructions.

## 2015-08-23 ENCOUNTER — Ambulatory Visit
Admission: RE | Admit: 2015-08-23 | Discharge: 2015-08-23 | Disposition: A | Discharge status: Home / Self Care | Payer: Managed Care, Other (non HMO) | Source: Ambulatory Visit | Attending: Radiation Oncology | Admitting: Radiation Oncology

## 2015-08-23 DIAGNOSIS — C211 Malignant neoplasm of anal canal: Secondary | ICD-10-CM

## 2015-08-23 DIAGNOSIS — C210 Malignant neoplasm of anus, unspecified: Secondary | ICD-10-CM

## 2015-08-24 NOTE — Progress Notes (Addendum)
  Radiation Oncology         [phone] ________________________________  Name: Julian Palmer MRN: [id number]  Date: 08/23/2015  DOB: 1961-02-21  SIMULATION AND TREATMENT PLANNING NOTE    ICD-9-CM ICD-10-CM   1. Primary cancer of anal canal (HCC) 154.2 C21.1   2. 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  Identity was confirmed.  All relevant records and images related to the planned course of therapy were reviewed.  The patient freely provided informed written consent to proceed with treatment after reviewing the details related to the planned course of therapy. The consent form was witnessed and verified by the simulation staff.  Then, the patient was set-up in a stable reproducible  supine position for radiation therapy.  CT images were obtained.  Surface markings were placed.  The CT images were loaded into the planning software.  Then the target and avoidance structures were contoured.  Treatment planning then occurred.  The radiation prescription was entered and confirmed.  Then, I designed and supervised the construction of a total of 1 medically necessary complex treatment devices.  I have requested : Intensity Modulated Radiotherapy (IMRT) is medically necessary for this case for the following reason:  Previous treatment to this area..  I have ordered:dose calc.  PLAN:  The patient will receive 35 Gy in 14 fractions.  ________________________________ Special treatment procedure note  The patient has received previous radiation therapy directed at the pelvis area. Additional time was taken in reviewing the patient's previous treatments as it relates to his current set up. Given this additional time and potential for increased toxicity with overlapping fields, this constitutes a special treatment procedure -----------------------------------  Blair Promise, PhD, MD

## 2015-08-28 NOTE — Addendum Note (Signed)
Encounter addended by: Gery Pray, MD on: 08/28/2015 10:25 AM<BR>     Documentation filed: Notes Section

## 2015-08-30 ENCOUNTER — Ambulatory Visit: Payer: Managed Care, Other (non HMO) | Admitting: Radiation Oncology

## 2015-08-30 DIAGNOSIS — C211 Malignant neoplasm of anal canal: Secondary | ICD-10-CM | POA: Diagnosis not present

## 2015-08-31 ENCOUNTER — Ambulatory Visit: Payer: Managed Care, Other (non HMO)

## 2015-09-01 ENCOUNTER — Ambulatory Visit: Payer: Managed Care, Other (non HMO) | Admitting: Hematology

## 2015-09-01 ENCOUNTER — Ambulatory Visit: Payer: Managed Care, Other (non HMO)

## 2015-09-01 ENCOUNTER — Other Ambulatory Visit: Payer: Managed Care, Other (non HMO)

## 2015-09-01 ENCOUNTER — Ambulatory Visit
Admission: RE | Admit: 2015-09-01 | Discharge: 2015-09-01 | Disposition: A | Discharge status: Home / Self Care | Payer: Managed Care, Other (non HMO) | Source: Ambulatory Visit | Attending: Radiation Oncology | Admitting: Radiation Oncology

## 2015-09-01 DIAGNOSIS — C210 Malignant neoplasm of anus, unspecified: Secondary | ICD-10-CM

## 2015-09-01 DIAGNOSIS — C211 Malignant neoplasm of anal canal: Secondary | ICD-10-CM | POA: Diagnosis not present

## 2015-09-01 NOTE — Progress Notes (Signed)
  Radiation Oncology         [phone] ________________________________  Name: Julian Palmer MRN: [id number]  Date: 09/01/2015  DOB: [birth year]  Simulation Verification Note    ICD-9-CM ICD-10-CM   1. 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 The clinical setup was verified. Then port films were obtained and uploaded to the radiation oncology medical record software.  The treatment beams were carefully compared against the planned radiation fields. The position location and shape of the radiation fields was reviewed. They targeted volume of tissue appears to be appropriately covered by the radiation beams. Organs at risk appear to be excluded as planned.  Based on my personal review, I approved the simulation verification. The patient's treatment will proceed as planned.  -----------------------------------  Blair Promise, PhD, MD

## 2015-09-02 ENCOUNTER — Ambulatory Visit: Payer: Managed Care, Other (non HMO)

## 2015-09-02 ENCOUNTER — Encounter: Payer: Self-pay | Admitting: *Deleted

## 2015-09-02 ENCOUNTER — Ambulatory Visit
Admission: RE | Admit: 2015-09-02 | Discharge: 2015-09-02 | Disposition: A | Discharge status: Home / Self Care | Payer: Managed Care, Other (non HMO) | Source: Ambulatory Visit | Attending: Radiation Oncology | Admitting: Radiation Oncology

## 2015-09-02 DIAGNOSIS — C211 Malignant neoplasm of anal canal: Secondary | ICD-10-CM | POA: Diagnosis not present

## 2015-09-02 NOTE — Progress Notes (Signed)
[place] Psychosocial Distress Screening Clinical Social Work  Clinical Social Work was referred by distress screening protocol.  The patient scored a 8 on the Psychosocial Distress Thermometer which indicates severe distress. Clinical Social Worker phoned pt to assess for distress and other psychosocial needs. CSW spoke with pt and reviewed options for additional support. Pt shared he was "growing wary" about his cancer". Continuing to work and find purpose is important to him. Pt not interested in coming back to [facility] later in the day for additional supports/classes. CSW offered support via phone and pt declined. He reports to have good support through his church. He denied SI or deppe depression. He agreed to hold on to CSW contact and will consider reaching out if needed.   ONCBCN DISTRESS SCREENING 08/17/2015  Screening Type Change in Status  Distress experienced in past week (1-10) 8  Emotional problem type Depression;Adjusting to illness  Physical Problem type Pain;Sleep/insomnia;Getting around  Physician notified of physical symptoms   Referral to clinical social work   Other     Clinical Social Worker follow up needed: No.  If yes, follow up plan:  Loren Racer, [place]  [facility] Phone: [phone] Fax: [phone]

## 2015-09-05 ENCOUNTER — Ambulatory Visit
Admission: RE | Admit: 2015-09-05 | Discharge: 2015-09-05 | Disposition: A | Discharge status: Home / Self Care | Payer: Managed Care, Other (non HMO) | Source: Ambulatory Visit | Attending: Radiation Oncology | Admitting: Radiation Oncology

## 2015-09-05 ENCOUNTER — Ambulatory Visit: Payer: Managed Care, Other (non HMO)

## 2015-09-05 DIAGNOSIS — C211 Malignant neoplasm of anal canal: Secondary | ICD-10-CM | POA: Diagnosis not present

## 2015-09-06 ENCOUNTER — Ambulatory Visit
Admission: RE | Admit: 2015-09-06 | Discharge: 2015-09-06 | Disposition: A | Discharge status: Home / Self Care | Payer: Managed Care, Other (non HMO) | Source: Ambulatory Visit | Attending: Radiation Oncology | Admitting: Radiation Oncology

## 2015-09-06 ENCOUNTER — Encounter: Payer: Self-pay | Admitting: Radiation Oncology

## 2015-09-06 VITALS — BP 115/76 | HR 97 | Temp 98.1°F | Ht 72.0 in | Wt 204.6 lb

## 2015-09-06 DIAGNOSIS — C211 Malignant neoplasm of anal canal: Secondary | ICD-10-CM | POA: Diagnosis not present

## 2015-09-06 DIAGNOSIS — C210 Malignant neoplasm of anus, unspecified: Secondary | ICD-10-CM

## 2015-09-06 NOTE — Progress Notes (Signed)
Julian Palmer is here for his 4th fraction of radiation to his Sacrum. He reports no pain while he is sitting upright. He reports severe pain when laying flat. He is taking norco for this pain about once a day. He reports fatigue even climbing up the steps to his house and doing other normal activities. His skin is normal appearing per his report at the radiation site. 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 He reports he has no issues urinating. He states he is eating well, he just has difficulty fixing his meals and does not do a lot of preparing of meals. I have suggested the use of supplements and he states he likes chocolate boost and may try that soon.  BP 115/76 mmHg  Pulse 97  Temp(Src) 98.1 F (36.7 C)  Ht 6' (1.829 m)  Wt 204 lb 9.6 oz (92.806 kg)  BMI 27.74 kg/m2  SpO2 94%   Wt Readings from Last 3 Encounters:  09/06/15 204 lb 9.6 oz (92.806 kg)  08/18/15 207 lb 12.8 oz (94.257 kg)  08/17/15 209 lb 8 oz (95.029 kg)

## 2015-09-06 NOTE — Progress Notes (Signed)
  Radiation Oncology         (336) [phone] ________________________________  Name: Julian Palmer MRN: [id number]  Date: 09/06/2015  DOB: July 13, 1960  Weekly Radiation Therapy Management    ICD-9-CM ICD-10-CM   1. 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 . . . . . . . The patient presents for routine under treatment assessment.                                   The patient is without complaint. No pain improvement yet.  Continues to work full-time. He is comfortable sitting up but has pain lying down and has difficulty sleeping in light of this issue                                 Set-up films were reviewed.                                 The chart was checked. Physical Findings. . .  height is 6' (1.829 m) and weight is 204 lb 9.6 oz (92.806 kg). His temperature is 98.1 F (36.7 C). His blood pressure is 115/76 and his pulse is 97. His oxygen saturation is 94%. . The lungs are clear. The heart has a regular rhythm and rate. The abdomen is soft and nontender with normal bowel sounds. Impression . . . . . . . The patient is tolerating radiation. Plan . . . . . . . . . . . . Continue treatment as planned.  ________________________________   Blair Promise, PhD, MD

## 2015-09-06 NOTE — Progress Notes (Signed)
Pt here for patient teaching.  Pt given Radiation and You booklet and skin care instructions. Pt reports they have not watched the Radiation Therapy Education video, but were given the link to watch at home.  Reviewed areas of pertinence such as diarrhea, fatigue, nausea and vomiting, sexual and fertility changes, skin changes, urinary and bladder changes, breast swelling, cough, shortness of breath, earaches and taste changes . Pt able to give teach back of to pat skin, use unscented/gentle soap, use baby wipes, have Imodium on hand and drink plenty of water, Pt verbalizes understanding of information given and will contact nursing with any questions or concerns.     Http://rtanswers.org/treatmentinformation/whattoexpect/index

## 2015-09-07 ENCOUNTER — Ambulatory Visit
Admission: RE | Admit: 2015-09-07 | Discharge: 2015-09-07 | Disposition: A | Discharge status: Home / Self Care | Payer: Managed Care, Other (non HMO) | Source: Ambulatory Visit | Attending: Radiation Oncology | Admitting: Radiation Oncology

## 2015-09-07 DIAGNOSIS — C211 Malignant neoplasm of anal canal: Secondary | ICD-10-CM | POA: Diagnosis not present

## 2015-09-08 ENCOUNTER — Ambulatory Visit
Admission: RE | Admit: 2015-09-08 | Discharge: 2015-09-08 | Disposition: A | Discharge status: Home / Self Care | Payer: Managed Care, Other (non HMO) | Source: Ambulatory Visit | Attending: Radiation Oncology | Admitting: Radiation Oncology

## 2015-09-08 DIAGNOSIS — C211 Malignant neoplasm of anal canal: Secondary | ICD-10-CM | POA: Diagnosis not present

## 2015-09-09 ENCOUNTER — Telehealth: Payer: Self-pay | Admitting: *Deleted

## 2015-09-09 ENCOUNTER — Telehealth: Payer: Self-pay | Admitting: Hematology

## 2015-09-09 ENCOUNTER — Ambulatory Visit
Admission: RE | Admit: 2015-09-09 | Discharge: 2015-09-09 | Disposition: A | Discharge status: Home / Self Care | Payer: Managed Care, Other (non HMO) | Source: Ambulatory Visit | Attending: Radiation Oncology | Admitting: Radiation Oncology

## 2015-09-09 DIAGNOSIS — C211 Malignant neoplasm of anal canal: Secondary | ICD-10-CM | POA: Diagnosis not present

## 2015-09-09 NOTE — Telephone Encounter (Signed)
Pt called & reported that he has an appt 3/23 in am for radiation & 2pm for Dr Burr Medico & doesn't want to come to [facility] twice in one day.  He would like to move his appt with Dr Burr Medico to after he finishes his radiation.  Referred to Melissa/Scheduling.

## 2015-09-09 NOTE — Telephone Encounter (Signed)
Received call from desk nurse re patient wanting to reschedule 3/23 f/u to the following week. Called patient and rescheduled 3/23 f/u to 3/30. Patient has new d/t date/time for 3/30 @ 8:45 am.

## 2015-09-12 ENCOUNTER — Ambulatory Visit
Admission: RE | Admit: 2015-09-12 | Discharge: 2015-09-12 | Disposition: A | Discharge status: Home / Self Care | Payer: Managed Care, Other (non HMO) | Source: Ambulatory Visit | Attending: Radiation Oncology | Admitting: Radiation Oncology

## 2015-09-12 DIAGNOSIS — C211 Malignant neoplasm of anal canal: Secondary | ICD-10-CM | POA: Diagnosis not present

## 2015-09-13 ENCOUNTER — Ambulatory Visit
Admission: RE | Admit: 2015-09-13 | Discharge: 2015-09-13 | Disposition: A | Discharge status: Home / Self Care | Payer: Managed Care, Other (non HMO) | Source: Ambulatory Visit | Attending: Radiation Oncology | Admitting: Radiation Oncology

## 2015-09-13 ENCOUNTER — Encounter: Payer: Self-pay | Admitting: Radiation Oncology

## 2015-09-13 VITALS — BP 114/71 | HR 90 | Temp 97.6°F | Resp 16 | Ht 72.0 in | Wt 203.0 lb

## 2015-09-13 DIAGNOSIS — C211 Malignant neoplasm of anal canal: Secondary | ICD-10-CM | POA: Diagnosis not present

## 2015-09-13 DIAGNOSIS — C210 Malignant neoplasm of anus, unspecified: Secondary | ICD-10-CM

## 2015-09-13 MED ORDER — GABAPENTIN 300 MG PO CAPS: 300.0000 mg | ORAL_CAPSULE | Freq: Three times a day (TID) | ORAL | Status: DC

## 2015-09-13 NOTE — Progress Notes (Signed)
  Radiation Oncology         (336) [phone] ________________________________  Name: Julian Palmer MRN: [id number]  Date: 09/13/2015  DOB: 05/27/1961  Weekly Radiation Therapy Management    ICD-9-CM ICD-10-CM   1. 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 . . . . . . . The patient presents for routine under treatment assessment.                                Julian Palmer has completed 9 fractions to his sacrum. He reports pain in his right hip/leg and is rating it at an 8/10. He reports he is not able to sleep at night due to the pain. He took percocet this morning which didn't relieve the pain at all. He denies having diarrhea. He denies having any bladder issues. He reports having fatigue. He is in a wheelchair today but reports that he walks slowly at home.  No reports of nausea                                    Set-up films were reviewed.                                 The chart was checked. Physical Findings. . .  height is 6' (1.829 m) and weight is 203 lb (92.08 kg). His oral temperature is 97.6 F (36.4 C). His blood pressure is 114/71 and his pulse is 90. His respiration is 16. . The lungs are clear. The heart has a regular rhythm and rate. The abdomen is soft and nontender with normal bowel sounds.  Impression . . . . . . . The patient is tolerating radiation. Plan . . . . . . . . . . . . Continue treatment as planned.  The patient appears to be having radicular type pain and we'll place him on Neurontin.  ________________________________   Blair Promise, PhD, MD

## 2015-09-13 NOTE — Progress Notes (Addendum)
Julian Palmer has completed 9 fractions to his sacrum.  He reports pain in his right hip/leg and is rating it at an 8/10.  He reports he is not able to sleep at night due to the pain.  He took percocet this morning which didn't relieve the pain at all.  He denies having diarrhea.  He denies having any bladder issues.  He reports having fatigue. He is in a wheelchair today but reports that he walks slowly at home.  BP 114/71 mmHg  Pulse 90  Temp(Src) 97.6 F (36.4 C) (Oral)  Resp 16  Ht 6' (1.829 m)   Wt Readings from Last 3 Encounters:  09/13/15 203 lb (92.08 kg)  09/06/15 204 lb 9.6 oz (92.806 kg)  08/18/15 207 lb 12.8 oz (94.257 kg)

## 2015-09-14 ENCOUNTER — Ambulatory Visit
Admission: RE | Admit: 2015-09-14 | Discharge: 2015-09-14 | Disposition: A | Discharge status: Home / Self Care | Payer: Managed Care, Other (non HMO) | Source: Ambulatory Visit | Attending: Radiation Oncology | Admitting: Radiation Oncology

## 2015-09-14 DIAGNOSIS — C211 Malignant neoplasm of anal canal: Secondary | ICD-10-CM | POA: Diagnosis not present

## 2015-09-15 ENCOUNTER — Ambulatory Visit: Payer: Managed Care, Other (non HMO) | Admitting: Hematology

## 2015-09-15 ENCOUNTER — Ambulatory Visit: Payer: Managed Care, Other (non HMO)

## 2015-09-15 ENCOUNTER — Ambulatory Visit
Admission: RE | Admit: 2015-09-15 | Discharge: 2015-09-15 | Disposition: A | Discharge status: Home / Self Care | Payer: Managed Care, Other (non HMO) | Source: Ambulatory Visit | Attending: Radiation Oncology | Admitting: Radiation Oncology

## 2015-09-15 DIAGNOSIS — C211 Malignant neoplasm of anal canal: Secondary | ICD-10-CM | POA: Diagnosis not present

## 2015-09-16 ENCOUNTER — Ambulatory Visit
Admission: RE | Admit: 2015-09-16 | Discharge: 2015-09-16 | Disposition: A | Discharge status: Home / Self Care | Payer: Managed Care, Other (non HMO) | Source: Ambulatory Visit | Attending: Radiation Oncology | Admitting: Radiation Oncology

## 2015-09-16 DIAGNOSIS — C211 Malignant neoplasm of anal canal: Secondary | ICD-10-CM | POA: Diagnosis not present

## 2015-09-19 ENCOUNTER — Ambulatory Visit
Admission: RE | Admit: 2015-09-19 | Discharge: 2015-09-19 | Disposition: A | Discharge status: Home / Self Care | Payer: Managed Care, Other (non HMO) | Source: Ambulatory Visit | Attending: Radiation Oncology | Admitting: Radiation Oncology

## 2015-09-19 ENCOUNTER — Ambulatory Visit: Admission: RE | Admit: 2015-09-19 | Payer: Managed Care, Other (non HMO) | Source: Ambulatory Visit

## 2015-09-19 ENCOUNTER — Ambulatory Visit: Payer: Managed Care, Other (non HMO)

## 2015-09-20 ENCOUNTER — Ambulatory Visit: Payer: Managed Care, Other (non HMO)

## 2015-09-20 ENCOUNTER — Ambulatory Visit: Payer: Managed Care, Other (non HMO) | Admitting: Radiation Oncology

## 2015-09-21 ENCOUNTER — Encounter: Payer: Self-pay | Admitting: Radiation Oncology

## 2015-09-21 ENCOUNTER — Ambulatory Visit
Admission: RE | Admit: 2015-09-21 | Discharge: 2015-09-21 | Disposition: A | Discharge status: Home / Self Care | Payer: Managed Care, Other (non HMO) | Source: Ambulatory Visit | Attending: Radiation Oncology | Admitting: Radiation Oncology

## 2015-09-21 ENCOUNTER — Ambulatory Visit: Payer: Managed Care, Other (non HMO)

## 2015-09-21 VITALS — BP 119/72 | HR 94 | Temp 98.9°F | Resp 16 | Ht 72.0 in | Wt 200.2 lb

## 2015-09-21 DIAGNOSIS — Z510 Encounter for antineoplastic radiation therapy: Secondary | ICD-10-CM | POA: Insufficient documentation

## 2015-09-21 DIAGNOSIS — C210 Malignant neoplasm of anus, unspecified: Secondary | ICD-10-CM | POA: Diagnosis present

## 2015-09-21 NOTE — Progress Notes (Signed)
Julian Palmer has completed 13 fractions to his sacrum.  He reports the pain in his right hip is about the same.  He is rating it at a 7/10 today.  He takes percocet at bedtime and once during the night.  He tries not to take it during the day.  He reports he is walking more at home.  He reports having fatigue and shortness of breath with activity.  He said this has been going on for about a week.  He also reports having a dry cough for the past 2 weeks.  His oxygen saturation today was 94%.  He reports having diarrhea yesterday and thinks it was from eating a lot of fruit.  He reports having a poor appetite.  He has lost 3 lbs since last week.  He reports he does not feel dehydrated and has been drinking a lot of water.  He has been given a one month follow up appointment.  BP 119/72 mmHg  Pulse 94  Temp(Src) 98.9 F (37.2 C) (Oral)  Resp 16  Ht 6' (1.829 m)  Wt 200 lb 3.2 oz (90.81 kg)  BMI 27.15 kg/m2  SpO2 94%   Wt Readings from Last 3 Encounters:  09/21/15 200 lb 3.2 oz (90.81 kg)  09/13/15 203 lb (92.08 kg)  09/06/15 204 lb 9.6 oz (92.806 kg)

## 2015-09-21 NOTE — Progress Notes (Signed)
  Radiation Oncology         (336) [phone] ________________________________  Name: Julian Palmer MRN: [id number]  Date: 09/21/2015  DOB: January 17, 1961  Weekly Radiation Therapy Management    ICD-9-CM ICD-10-CM   1. 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 . . . . . . . The patient presents for routine under treatment assessment.                                   The patient Continues to have pain with [place] improvement thus far. He is however walking better at home. He does have fatigue but is continuing to work his usual schedule. Patient denies any significant nausea or diarrhea.                                 Set-up films were reviewed.                                 The chart was checked. Physical Findings. . .  height is 6' (1.829 m) and weight is 200 lb 3.2 oz (90.81 kg). His oral temperature is 98.9 F (37.2 C). His blood pressure is 119/72 and his pulse is 94. His respiration is 16 and oxygen saturation is 94%. . The lungs are clear. The heart has regular rhythm and rate. The abdomen is soft and nontender with bowel sounds. Impression . . . . . . . The patient is tolerating radiation. Plan . . . . . . . . . . . . Continue treatment as planned.  ________________________________   Blair Promise, PhD, MD

## 2015-09-22 ENCOUNTER — Ambulatory Visit: Payer: Managed Care, Other (non HMO)

## 2015-09-22 ENCOUNTER — Encounter: Payer: Self-pay | Admitting: Hematology

## 2015-09-22 ENCOUNTER — Telehealth: Payer: Self-pay | Admitting: *Deleted

## 2015-09-22 ENCOUNTER — Ambulatory Visit (HOSPITAL_BASED_OUTPATIENT_CLINIC_OR_DEPARTMENT_OTHER): Payer: Managed Care, Other (non HMO)

## 2015-09-22 ENCOUNTER — Ambulatory Visit (HOSPITAL_BASED_OUTPATIENT_CLINIC_OR_DEPARTMENT_OTHER): Payer: Managed Care, Other (non HMO) | Admitting: Hematology

## 2015-09-22 ENCOUNTER — Other Ambulatory Visit: Payer: Self-pay | Admitting: *Deleted

## 2015-09-22 ENCOUNTER — Encounter: Payer: Self-pay | Admitting: Radiation Oncology

## 2015-09-22 ENCOUNTER — Encounter: Payer: Self-pay | Admitting: *Deleted

## 2015-09-22 ENCOUNTER — Ambulatory Visit
Admission: RE | Admit: 2015-09-22 | Discharge: 2015-09-22 | Disposition: A | Discharge status: Home / Self Care | Payer: Managed Care, Other (non HMO) | Source: Ambulatory Visit | Attending: Radiation Oncology | Admitting: Radiation Oncology

## 2015-09-22 DIAGNOSIS — E119 Type 2 diabetes mellitus without complications: Secondary | ICD-10-CM

## 2015-09-22 DIAGNOSIS — C211 Malignant neoplasm of anal canal: Secondary | ICD-10-CM

## 2015-09-22 DIAGNOSIS — C7951 Secondary malignant neoplasm of bone: Secondary | ICD-10-CM

## 2015-09-22 DIAGNOSIS — Z21 Asymptomatic human immunodeficiency virus [HIV] infection status: Secondary | ICD-10-CM

## 2015-09-22 DIAGNOSIS — F329 Major depressive disorder, single episode, unspecified: Secondary | ICD-10-CM

## 2015-09-22 DIAGNOSIS — I10 Essential (primary) hypertension: Secondary | ICD-10-CM

## 2015-09-22 DIAGNOSIS — F32A Depression, unspecified: Secondary | ICD-10-CM

## 2015-09-22 DIAGNOSIS — B20 Human immunodeficiency virus [HIV] disease: Secondary | ICD-10-CM

## 2015-09-22 DIAGNOSIS — IMO0001 Reserved for inherently not codable concepts without codable children: Secondary | ICD-10-CM

## 2015-09-22 DIAGNOSIS — I2510 Atherosclerotic heart disease of native coronary artery without angina pectoris: Secondary | ICD-10-CM

## 2015-09-22 DIAGNOSIS — E1165 Type 2 diabetes mellitus with hyperglycemia: Secondary | ICD-10-CM

## 2015-09-22 DIAGNOSIS — Z794 Long term (current) use of insulin: Secondary | ICD-10-CM

## 2015-09-22 DIAGNOSIS — C210 Malignant neoplasm of anus, unspecified: Secondary | ICD-10-CM

## 2015-09-22 LAB — CBC WITH DIFFERENTIAL/PLATELET
BASO%: 1.2 % (ref 0.0–2.0)
BASOS ABS: 0.1 10*3/uL (ref 0.0–0.1)
EOS ABS: 0.2 10*3/uL (ref 0.0–0.5)
EOS%: 3 % (ref 0.0–7.0)
HEMATOCRIT: 37.9 % — AB (ref 38.4–49.9)
HEMOGLOBIN: 12.7 g/dL — AB (ref 13.0–17.1)
LYMPH#: 0.7 10*3/uL — AB (ref 0.9–3.3)
LYMPH%: 13.7 % — ABNORMAL LOW (ref 14.0–49.0)
MCH: 29.1 pg (ref 27.2–33.4)
MCHC: 33.4 g/dL (ref 32.0–36.0)
MCV: 87.2 fL (ref 79.3–98.0)
MONO#: 0.9 10*3/uL (ref 0.1–0.9)
MONO%: 17.2 % — ABNORMAL HIGH (ref 0.0–14.0)
NEUT#: 3.5 10*3/uL (ref 1.5–6.5)
NEUT%: 64.9 % (ref 39.0–75.0)
PLATELETS: 243 10*3/uL (ref 140–400)
RBC: 4.35 10*6/uL (ref 4.20–5.82)
RDW: 15.1 % — AB (ref 11.0–14.6)
WBC: 5.5 10*3/uL (ref 4.0–10.3)

## 2015-09-22 LAB — COMPREHENSIVE METABOLIC PANEL
ALBUMIN: 3.2 g/dL — AB (ref 3.5–5.0)
ALK PHOS: 166 U/L — AB (ref 40–150)
ALT: 32 U/L (ref 0–55)
ANION GAP: 11 meq/L (ref 3–11)
AST: 35 U/L — AB (ref 5–34)
BUN: 27.2 mg/dL — ABNORMAL HIGH (ref 7.0–26.0)
CALCIUM: 9.7 mg/dL (ref 8.4–10.4)
CHLORIDE: 102 meq/L (ref 98–109)
CO2: 24 mEq/L (ref 22–29)
CREATININE: 1.3 mg/dL (ref 0.7–1.3)
EGFR: 64 mL/min/{1.73_m2} — ABNORMAL LOW (ref >90)
Glucose: 74 mg/dl (ref 70–140)
POTASSIUM: 4.5 meq/L (ref 3.5–5.1)
Sodium: 138 mEq/L (ref 136–145)
Total Bilirubin: 0.3 mg/dL (ref 0.20–1.20)
Total Protein: 8.3 g/dL (ref 6.4–8.3)

## 2015-09-22 NOTE — Progress Notes (Signed)
Oncology Nurse Navigator Documentation  Oncology Nurse Navigator Flowsheets 09/22/2015  Navigator Location [facility]  Navigator Encounter Type Follow-up Appt  Patient Visit Type MedOnc  Treatment Phase Other--completed IMRT today--on treatment break  Barriers/Navigation Needs Family concerns  Interventions Other--supportive listening; will follow up on physical therapy referral   Support Groups/Services -  Acuity Level 1  Time Spent with Patient 30  Julian Palmer's main complaint is of extreme fatigue and weakness. Still working every day, but stamina is low. Agrees to PT referral and wants to go in [place] [address] where he works. Will have 6 weeks treatment break and return and schedule f/u scans. He is looking forward to return to [place] in June to [place] music festival.

## 2015-09-22 NOTE — Progress Notes (Signed)
[place] NOTE  Patient Care Team: Leonel Ramsay, MD as PCP - General (Infectious Diseases) Minna Merritts, MD as Consulting Physician (Cardiology) Robert Bellow, MD (General Surgery) Leighton Ruff, MD as Consulting Physician (General Surgery) Irene Limbo, MD as Consulting Physician (Plastic Surgery) Truitt Merle, MD as Consulting Physician (Hematology) Festus Aloe, MD as Consulting Physician (Urology) Gery Pray, MD as Consulting Physician (Radiation Oncology)  DIAGNOSIS Follow-up recurrent anal cancer    Primary cancer of anal canal ([place])   02/05/2011 Initial Diagnosis Primary cancer of anal canal, T1N0M0, s/p surgical resection with positive margines. He received definitive dose RT 45 Gy over 5 weeks with 16Gy boosting and concurrent chemo with 5FU, treated at [facility] by Drs.  Geoffreyh and [employer].    11/23/2013 Relapse/Recurrence local recurrence    04/21/2014 Surgery abdominal perianal resection (APR) and VRAM flap closure of perineal wound on 04/21/14, surgical margins were positive for cancer    06/28/2014 - 09/25/2014 Chemotherapy 5-FU 1000mg /m2/day on D1-5, ciplatin 100mg /m2 on D2, every 28 days, s/p 4 cycles    10/11/2014 Progression CT CAP showed new liver lesions and abd/pelc adenopathy, highly suspecious for progressive metastatic disease    11/02/2014 Imaging abdomen MRI w wo contrast showed new small lesions in the right hepatic lobe with nonspecific enhancement, and enlarging nodes in the porta hepatis, metastatic disease not excluded    02/24/2015 Imaging PET scan showed hypermetabolic bone mets in right sacrum, and small hypermetabolic retroperitoneal lymph nodes, no other abnormal metabolic activity within the liver or other place.   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   04/15/2015 - 08/18/2015 Chemotherapy Nivolumab 240 mg every 2 weeks, stopped due to disease  progression    07/07/2015 Imaging PET scan showed interval progression of lytic metastasis involving the righ side of sacrum, pathologic fracture involving the right sacral wing. Improvement in retroperitoneal lymph node metastasis. 7 mm nodule in the left upper lobe lung, indeterminate.   09/01/2015 - 09/22/2015 Radiation Therapy palliative radiation to sacrum, IMRT, 35 Gy in 14 sessions      OTHER RELATED ISSUES: 1. (+) HIV, on therapy with good control  2. CAD, s/p stent placement in [place]: supportive care  INTERIM HISTORY: Julian Palmer returns for follow up. He completed his last dose of radiation therapy this morning. She has been tolerating the treatment well overall, his pain has slightly improved, he rates at 5-6/10, he takes oxycodone one tablet at night, Advil as needed during the day. He is able to take care of himself at home, lives independently, and still works full time. He has been having dry cough lately, which she contributes to allergy. No fever, chest pain, dyspnea. He has moderate fatigue, (+) muscle atrophy on his legs. No fever or chills, appetite is moderate, he has lost about 10 pounds in the past month.   MEDICAL HISTORY:  Past Medical History  Diagnosis Date  . S/p bare metal coronary artery stent     1998  pLAD  . Hypertension   . 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 Hyperlipidemia   . History of carcinoma in situ of anal canal     2010  . ED (erectile dysfunction)   . Sigmoid diverticulosis   . PONV (postoperative nausea  and vomiting)   . Wears glasses   . At risk for sleep apnea     STOP-BANG= 4     SENT TO PCP 03-23-2014  . Coronary artery disease CARDIOLOGIST--  DR Rockey Situ --  LAST LOV 04-17-2013    S/P  PCI to mid and distal LAD and BM stent x1 to pLAD  . Anxiety   . Depression   . HIV  positive ([place])   . Type 2 diabetes mellitus ([place])   . Radiation 01/2011    definitive dose RT 45 Gy over 5 weeks with 16Gy boosting  . 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 Primary cancer of anal canal ([place]) 05/18/2011    SURGICAL HISTORY: Past Surgical History  Procedure Laterality Date  . Septoplasty  1985  . Sphincterotomy  2009  . Cardiovascular stress test  04/ 2010    low risk study/  very small region of mildly reduced perfersion is mildly reversible dLAD territory  . Resection anal mass  02-05-2011  . Excision anal polyp  2009  . Re-excision anal squamous cell carcinoma  2010  . Removal rectal mass  01-08-2014  . Eua w/ bx perianal skin  06-08-2009  . Port-a-cath placement  02-21-2011    REMOVED  2013  . Coronary angioplasty with stent placement  1998   [facility]    BM stent to pLAD and PCI to Mid and Distal LAD/  no other significant cad  . Inguinal hernia repair Bilateral 1988  . Colonoscopy with propofol  05-28-2008  . Mass excision N/A 03/26/2014    Procedure: ANAL EXAM UNDER ANESTHESIA,EXCISIONAL BIOPSY OF ANAL MASS;  Surgeon: Leighton Ruff, MD;  Location: [facility];  Service: General;  Laterality: N/A;  . Laparoscopic assisted abdominal perineal resection N/A 04/22/2014    Procedure: LAPAROSCOPIC  ABDOMINAL PERINEAL RESECTION, ;  Surgeon: Leighton Ruff, MD;  Location: [facility];  Service: General;  Laterality: N/A;  . Urethrotomy Bilateral 04/22/2014    Procedure: CYSTOSCOPY/URETHROTOMY;  Surgeon: Leighton Ruff, MD;  Location: [facility];  Service: General;  Laterality: Bilateral;  . Muscle flap closure N/A 04/22/2014    Procedure: VRAM FLAP;  Surgeon: Irene Limbo, MD;  Location: [facility];  Service: Plastics;  Laterality: N/A;  . Portacath placement Left 06/24/2014    Procedure: INSERTION PORT-A-CATH LEFT SUBCLAVIAN;  Surgeon: Leighton Ruff, MD;  Location: [facility];  Service: General;  Laterality: Left;    SOCIAL  HISTORY: Social History   Social History  . Marital Status: Single    Spouse Name: N/A  . Number of Children: 0  . Years of Education: N/A   Occupational History  .  [employer] History Main Topics  . Smoking status: Never Smoker   . Smokeless tobacco: Never Used  . Alcohol Use: No     Comment: RARE  . Drug Use: No  . Sexual Activity: Not on file   Other Topics Concern  . Not on file   Social History Narrative   Full time. Single. Does not regularly exercise.     FAMILY HISTORY: Family History  Problem Relation Age of Onset  . Diabetes type II Mother   . Breast cancer Mother 39     survivor  . Hypertension Mother   . Coronary artery disease Father   Paternal GM had utrine cancer, no other history of malignancy   ALLERGIES:  is allergic to sulfa antibiotics.  MEDICATIONS:  Current  Outpatient Prescriptions  Medication Sig Dispense Refill  . aspirin (ASPIR-81) 81 MG EC tablet Take 81 mg by mouth at bedtime.     Marland Kitchen atorvastatin (LIPITOR) 80 MG tablet Take 1 tablet (80 mg total) by mouth every evening. 90 tablet 4  . efavirenz-emtrictabine-tenofovir (ATRIPLA) 600-200-300 MG per tablet Take 1 tablet by mouth at bedtime.     . Empagliflozin-Linagliptin (GLYXAMBI) 25-5 MG TABS Take 1 tablet by mouth daily.    Marland Kitchen gabapentin (NEURONTIN) 300 MG capsule Take 1 capsule (300 mg total) by mouth 3 (three) times daily. 30 capsule 2  . glimepiride (AMARYL) 2 MG tablet Take 4 mg by mouth daily with breakfast.     . losartan (COZAAR) 100 MG tablet Take 1 tablet (100 mg total) by mouth daily. 90 tablet 4  . metFORMIN (GLUCOPHAGE) 1000 MG tablet Take 1,000 mg by mouth 2 (two) times daily.      Marland Kitchen oxyCODONE-acetaminophen (PERCOCET/ROXICET) 5-325 MG tablet Take 1 tablet by mouth every 8 (eight) hours as needed for moderate pain or severe pain. 30 tablet 0  . HYDROcodone-acetaminophen (NORCO/VICODIN) 5-325 MG tablet Take 1 tablet by mouth every 8 (eight) hours as needed for  moderate pain. (Patient not taking: Reported on 09/13/2015) 60 tablet 0  . lidocaine-prilocaine (EMLA) cream Apply topically as needed. Reported on 09/22/2015     No current facility-administered medications for this visit.   Facility-Administered Medications Ordered in Other Visits  Medication Dose Route Frequency Provider Last Rate Last Dose  . heparin lock flush 100 unit/mL  500 Units Intravenous Once Truitt Merle, MD      . heparin lock flush 100 unit/mL  500 Units Intracatheter Once PRN Truitt Merle, MD      . sodium chloride 0.9 % injection 10 mL  10 mL Intravenous PRN Truitt Merle, MD      . sodium chloride 0.9 % injection 10 mL  10 mL Intracatheter PRN Truitt Merle, MD        REVIEW OF SYSTEMS:   Constitutional: Denies fevers, chills or abnormal night sweats Eyes: Denies blurriness of vision, double vision or watery eyes Ears, nose, mouth, throat, and face: Denies mucositis or sore throat Respiratory: Denies cough, dyspnea or wheezes Cardiovascular: Denies palpitation, chest discomfort or lower extremity swelling Gastrointestinal:  Denies nausea, heartburn or change in bowel habits, (+) colostomy bag Skin: Mild skin pigmentation from his previous rash. Lymphatics: Denies new lymphadenopathy or easy bruising Neurological:Denies numbness, tingling or new weaknesses Behavioral/Psych: Mood is stable, no new changes  All other systems were reviewed with the patient and are negative.  PHYSICAL EXAMINATION: ECOG PERFORMANCE STATUS: 3 Blood pressure 101/63, heart rate 99, respiratory rate 18, temperature 99.7, pulse ox 92% on room air GENERAL:alert, no distress, sits in a wheelchair comfortably SKIN: skin color, texture, turgor are normal, healed rashes on her arms, legs, and trunk with skin pigmentation.  EYES: normal, conjunctiva are pink and non-injected, sclera clear OROPHARYNX:no exudate, no erythema and lips, buccal mucosa, and tongue normal  NECK: supple, thyroid normal size, non-tender,  without nodularity LYMPH:  no palpable lymphadenopathy in the cervical, axillary or inguinal LUNGS: clear to auscultation and percussion with normal breathing effort HEART: regular rate & rhythm and no murmurs and no lower extremity edema ABDOMEN:abdomen soft, non-tender and normal bowel sounds. (+) Colostomy bag. The perianal reconstruction site is completely healed.  Musculoskeletal:no cyanosis of digits and no clubbing  PSYCH: alert & oriented x 3 with fluent speech NEURO: no focal motor/sensory deficits SKIN: A few  skin pigmentation from his previous rash  LABORATORY DATA:  I have reviewed the data as listed CBC Latest Ref Rng 08/18/2015 08/04/2015 07/21/2015  WBC 4.0 - 10.3 10e3/uL 7.9 7.4 4.1  Hemoglobin 13.0 - 17.1 g/dL 12.5(L) 12.0(L) 11.4(L)  Hematocrit 38.4 - 49.9 % 37.5(L) 36.2(L) 33.5(L)  Platelets 140 - 400 10e3/uL 257 241 209     Recent Labs  07/21/15 1409 08/04/15 1338 08/16/15 0707 08/18/15 1136  NA 138 137  --  134*  K 4.2 4.2  --  4.4  CO2 26 20*  --  21*  GLUCOSE 96 211*  --  200*  BUN 21.9 28.6*  --  30.8*  CREATININE 1.3 1.4*  --  1.5*  CALCIUM 9.4 9.2  --  9.2  PROT 7.7 8.2 7.6 8.4*  ALBUMIN 3.6 3.6 4.3 3.7  AST 28 33 36 29  ALT 26 33 32 36  ALKPHOS 121 138 149* 152*  BILITOT <0.30 <0.30 0.3 <0.30  BILIDIR  --   --  0.10  --      PATHOLOGY REPORT 04/22/2014 Diagnosis 1. Soft tissue, biopsy, right pelvic sidewall; r/o malignancy - FIBROADIPOSE TISSUE, NO EVIDENCE OF MALIGNANCY. 2. Colon, segmental resection for tumor, rectum,sigmoid and anus - RECURRENT INVASIVE POORLY DIFFERENTIATED SQUAMOUS CELL CARCINOMA, INVADING THROUGH THE MUSCULARIS PROPRIA, INTO PERICOLONIC FATTY TISSUE, EXTENDING TO THE DEEP INKED MARGIN. - PERINEURAL INVASION AND ANGIOLYMPHATIC PRESENT. - SEVEN OF SEVENTEEN LYMPH NODES, POSITIVE FOR METASTATIC CARCINOMA (7/17) Microscopic Comment 2. Anus Specimen: Anus, rectum, sigmoid Procedure: Segmental resection Tumor site: Distal  ends of specimen Specimen integrity: Intact Invasive tumor: Maximum size: At least 2.5 cm Histologic type(s): Invasive squamous cell carcinoma Histologic grade and differentiation: G2-3, moderately to poorly differentiated Microscopic extension of invasive tumor: Invading through the muscularis propria into pericolonic fatty tissue involving adjacent skeletal muscle tissue Lymph-Vascular invasion: Present Peri-neural invasion: Present Resection margins: Deep margin is positive for tumor Treatment effect (neo-adjuvant therapy): Present, minimally Lymph nodes: number examined 17; number positive: 7 Pathologic Staging: ypT2, yN1 (R1) Ancillary studies: N/A  Diagnosis 03/15/2015 Bone, biopsy, caudal left side of the sacrum - METASTATIC SQUAMOUS CELL CARCINOMA, SEE COMMENT. Microscopic Comment Biopsies demonstrate extensive involvement by metastatic focally keratinizing, well to moderately differentiated squamous cell carcinoma. The previous anorectal resection demonstrating invasive squamous cell carcinoma is noted [id number]). (CRR:ecj 03/16/2015)  RADIOGRAPHIC STUDIES: I have personally reviewed the radiological images as listed and agreed with the findings in the report.  Pet 02/24/2015  IMPRESSION: 1. Dominant finding is new hypermetabolic skeletal metastasis in the RIGHT sacrum. 2. Small hypermetabolic retroperitoneal periaortic and periportal lymph nodes consistent with nodal metastasis to retroperitoneum. 3. No abnormal metabolic activity within the liver to suggest metastasis. Lesions on comparison MRI were very small which could limit PET accuracy.  PET 07/07/2015 IMPRESSION: 1. Compared with the previous study there has been interval progression of lytic metastasis involving the right side of sacrum. This now crosses the sacroiliac joint and involves the posterior right iliac bone. There is now a pathologic fracture involving the right sacral wing. 2. Improvement an  retroperitoneal lymph node metastasis. 3. Small nonspecific nodule in the left upper lobe measures 7 mm and appears new from previous exam. 4. Aortic atherosclerosis and multi vessel coronary artery calcification 5. Gallstones.  ASSESSMENT & PLAN:  55 years old male with past medical history of anal squamous cell carcinoma, status post surgical resection and concurrent chemoradiation in 2009, now has local recurrence status post APR and  reconstruction.ypT2, yN1 (R1), unfortunately  his deep surgical margin was positive, and 7 out of 17 lymph nodes were positive also. No distant metastasis on the preop PET scan. His medical history is also complicated by diabetes, coronary artery disease status post stent placement, and HIV on treatment.   1. Local recurrence of anal squamous cell carcinoma, with positive surgical margines, biopsy proven sacral bone mets on 03/15/2015 -His recent PET scan from 02/24/2015 showed hypermetabolic skeletal metastasis in the right sacrum, and a few small hypermetabolic retroperitoneal lymph nodes, liver tissue was non-hypermetabolic, no other distant metastasis. -I discussed his sacral bone biopsy results, unfortunately he confirmed metastasis. -Patient does not want more chemotherapy. He was started on Nivolumab  -unfortunately he has progressed through nipple, and received palliative radiation to his sacrum for pain control, which he just completed today. His pain has slightly improved -I have checked clinical trial opportunities at at [facility], unfortunately he has no suitable clinical trial for him right now. -I encouraged him to do physical therapy to get some strength back, he is agreeable, we'll refer him. -the patient wants a break from treatment. And he is quite clear she does not want more chemotherapy. He actually has arranged his funeral. He wants to go to [place] in June -I'll continue supportive care for now, see him back in 6  weeks.  2. Sacral pain -secondary to his sacral bone metastasis, status post palliative radiation -He will continue oxycodone or hydrocodone as needed for pain, he takes once a day on average -He tried tramadol before, it did not work well -I suggest him to use Tylenol as needed. Today, and use less Advil due to his mild renal insufficiency  2. Diabetes -controlled recently  -continue metformin and glimepiride, follow up with his endocrinologist   3. HIV  -Well controlled. Continue his treatment . His recent CD4 count was normal.   4. Hypertension and coronary artery disease  -He will continue follow-up with his primary care physician and cardiologist. Continue medications.   6. depression -stable  7. Nonproductive Cough -He has nonproductive cough in the past months, possibly related to allergy, I suggest him to try Benadryl or Claritin -His last PET scan did show a small new lung nodule, lung metastasis is also possibility. If his cough does not improve in the next months, I'll get a CT chest  Plan -lab today -I'll see him back in 6 weeks with lab, plan to repeat a PET scan in 3 months, sooner if needed.  All questions were answered. The patient knows to call the clinic with any problems, questions or concerns.   I spent 30 minutes counseling the patient face to face. The total time spent in the appointment was 40 minutes and more than 50% was on counseling.     Truitt Merle, MD 09/22/2015

## 2015-09-22 NOTE — Telephone Encounter (Signed)
Spoke with pt and informed pt re: lab results today fine as per md.  Referral made to [facility] for PT rehab .  Pt voiced understanding.

## 2015-09-23 ENCOUNTER — Ambulatory Visit: Payer: Managed Care, Other (non HMO)

## 2015-09-26 ENCOUNTER — Telehealth: Payer: Self-pay | Admitting: *Deleted

## 2015-09-26 NOTE — Telephone Encounter (Signed)
Received vm call from pt stating that he couldn't get in with [facility] for PT until 1st opening of 11/03/15.  He states there is another PT office called [facility] across from hospital & wondered if we can get him in there.  Called # he gave [phone] & was informed that they could probably get pt in next week.  Faxed order, demographic sheet, last ov note & insurance info to [phone].  Informed Dr Burr Medico  Pt.

## 2015-10-03 ENCOUNTER — Telehealth: Payer: Self-pay | Admitting: *Deleted

## 2015-10-03 ENCOUNTER — Telehealth: Payer: Self-pay | Admitting: Hematology

## 2015-10-03 NOTE — Telephone Encounter (Signed)
Oncology Nurse Navigator Documentation  Oncology Nurse Navigator Flowsheets 10/03/2015  Navigator Location [facility]  Navigator Encounter Type Telephone  Telephone Appt Confirmation/Clarification;Patient Update--confirmed w/Isiaah that he has his first visit with physical therapy on 10/04/15  Patient Visit Type -  Treatment Phase -  Barriers/Navigation Needs Coordination of Care--Flush  Interventions Coordination of Care--POF to scheduler to add port flush w/lab appointment on 11/03/15  Coordination of Care Appts  Support Groups/Services -  Acuity Level 1  Time Spent with Patient 15

## 2015-10-03 NOTE — Telephone Encounter (Signed)
Flush w/ lab scheduled per pof

## 2015-10-11 ENCOUNTER — Telehealth: Payer: Self-pay | Admitting: Oncology

## 2015-10-11 NOTE — Telephone Encounter (Signed)
Julian Palmer left a message saying that he feels exhausted since radiation and "can't funtion."  He said it takes him 5 minutes to go up 14 stairs in his house.  He is wondering if there is anything he can do to improve his stamina.

## 2015-10-12 ENCOUNTER — Encounter (HOSPITAL_COMMUNITY): Payer: Self-pay

## 2015-10-12 ENCOUNTER — Encounter: Payer: Self-pay | Admitting: Radiation Oncology

## 2015-10-12 ENCOUNTER — Ambulatory Visit
Admission: RE | Admit: 2015-10-12 | Discharge: 2015-10-12 | Disposition: A | Discharge status: Home / Self Care | Payer: Managed Care, Other (non HMO) | Source: Ambulatory Visit | Attending: Radiation Oncology | Admitting: Radiation Oncology

## 2015-10-12 ENCOUNTER — Ambulatory Visit (HOSPITAL_BASED_OUTPATIENT_CLINIC_OR_DEPARTMENT_OTHER): Payer: Managed Care, Other (non HMO)

## 2015-10-12 ENCOUNTER — Ambulatory Visit (HOSPITAL_COMMUNITY)
Admission: RE | Admit: 2015-10-12 | Discharge: 2015-10-12 | Disposition: A | Discharge status: Home / Self Care | Payer: Managed Care, Other (non HMO) | Source: Ambulatory Visit | Attending: Radiation Oncology | Admitting: Radiation Oncology

## 2015-10-12 ENCOUNTER — Other Ambulatory Visit: Payer: Self-pay | Admitting: Oncology

## 2015-10-12 VITALS — BP 114/68 | HR 103

## 2015-10-12 VITALS — BP 104/75 | HR 115 | Temp 98.0°F | Ht 72.0 in | Wt 190.3 lb

## 2015-10-12 DIAGNOSIS — R918 Other nonspecific abnormal finding of lung field: Secondary | ICD-10-CM | POA: Insufficient documentation

## 2015-10-12 DIAGNOSIS — K8020 Calculus of gallbladder without cholecystitis without obstruction: Secondary | ICD-10-CM | POA: Insufficient documentation

## 2015-10-12 DIAGNOSIS — Z959 Presence of cardiac and vascular implant and graft, unspecified: Secondary | ICD-10-CM | POA: Diagnosis not present

## 2015-10-12 DIAGNOSIS — C210 Malignant neoplasm of anus, unspecified: Secondary | ICD-10-CM | POA: Diagnosis not present

## 2015-10-12 DIAGNOSIS — I951 Orthostatic hypotension: Secondary | ICD-10-CM

## 2015-10-12 DIAGNOSIS — C211 Malignant neoplasm of anal canal: Secondary | ICD-10-CM

## 2015-10-12 DIAGNOSIS — I2510 Atherosclerotic heart disease of native coronary artery without angina pectoris: Secondary | ICD-10-CM | POA: Insufficient documentation

## 2015-10-12 DIAGNOSIS — K7689 Other specified diseases of liver: Secondary | ICD-10-CM | POA: Insufficient documentation

## 2015-10-12 LAB — CBC WITH DIFFERENTIAL/PLATELET
BASO%: 1.3 % (ref 0.0–2.0)
Basophils Absolute: 0.1 10*3/uL (ref 0.0–0.1)
EOS%: 2.6 % (ref 0.0–7.0)
Eosinophils Absolute: 0.1 10*3/uL (ref 0.0–0.5)
HCT: 42.4 % (ref 38.4–49.9)
HGB: 13.9 g/dL (ref 13.0–17.1)
LYMPH%: 9.3 % — AB (ref 14.0–49.0)
MCH: 28.8 pg (ref 27.2–33.4)
MCHC: 32.7 g/dL (ref 32.0–36.0)
MCV: 88 fL (ref 79.3–98.0)
MONO#: 0.7 10*3/uL (ref 0.1–0.9)
MONO%: 13.4 % (ref 0.0–14.0)
NEUT%: 73.4 % (ref 39.0–75.0)
NEUTROS ABS: 4 10*3/uL (ref 1.5–6.5)
Platelets: 291 10*3/uL (ref 140–400)
RBC: 4.82 10*6/uL (ref 4.20–5.82)
RDW: 16 % — ABNORMAL HIGH (ref 11.0–14.6)
WBC: 5.5 10*3/uL (ref 4.0–10.3)
lymph#: 0.5 10*3/uL — ABNORMAL LOW (ref 0.9–3.3)

## 2015-10-12 LAB — COMPREHENSIVE METABOLIC PANEL
ALT: 36 U/L (ref 0–55)
AST: 33 U/L (ref 5–34)
Albumin: 3.2 g/dL — ABNORMAL LOW (ref 3.5–5.0)
Alkaline Phosphatase: 233 U/L — ABNORMAL HIGH (ref 40–150)
Anion Gap: 12 mEq/L — ABNORMAL HIGH (ref 3–11)
BUN: 23.9 mg/dL (ref 7.0–26.0)
CHLORIDE: 101 meq/L (ref 98–109)
CO2: 21 meq/L — AB (ref 22–29)
CREATININE: 1.3 mg/dL (ref 0.7–1.3)
Calcium: 10 mg/dL (ref 8.4–10.4)
EGFR: 60 mL/min/{1.73_m2} — ABNORMAL LOW (ref >90)
GLUCOSE: 242 mg/dL — AB (ref 70–140)
Potassium: 5.1 mEq/L (ref 3.5–5.1)
SODIUM: 134 meq/L — AB (ref 136–145)
Total Bilirubin: 0.36 mg/dL (ref 0.20–1.20)
Total Protein: 8.4 g/dL — ABNORMAL HIGH (ref 6.4–8.3)

## 2015-10-12 MED ORDER — IOPAMIDOL (ISOVUE-370) INJECTION 76%
100.0000 mL | Freq: Once | INTRAVENOUS | Status: AC | PRN
  Administered 2015-10-12: 80 mL via INTRAVENOUS

## 2015-10-12 MED ORDER — OXYCODONE-ACETAMINOPHEN 5-325 MG PO TABS: 1.0000 | ORAL_TABLET | Freq: Three times a day (TID) | ORAL | Status: DC | PRN

## 2015-10-12 MED ORDER — SODIUM CHLORIDE 0.9% FLUSH
10.0000 mL | INTRAVENOUS | Status: DC | PRN
  Administered 2015-10-12: 10 mL via INTRAVENOUS
  Filled 2015-10-12: qty 10

## 2015-10-12 MED ORDER — SODIUM CHLORIDE 0.9 % IV SOLN
INTRAVENOUS | Status: DC
  Administered 2015-10-12: 15:00:00 via INTRAVENOUS

## 2015-10-12 MED ORDER — LORAZEPAM 0.5 MG PO TABS
0.5000 mg | ORAL_TABLET | Freq: Once | ORAL | Status: AC
  Administered 2015-10-12: 0.5 mg via SUBLINGUAL
  Filled 2015-10-12: qty 1

## 2015-10-12 MED ORDER — HEPARIN SOD (PORK) LOCK FLUSH 100 UNIT/ML IV SOLN
500.0000 [IU] | Freq: Once | INTRAVENOUS | Status: AC
  Administered 2015-10-12: 500 [IU] via INTRAVENOUS
  Filled 2015-10-12: qty 5

## 2015-10-12 NOTE — Progress Notes (Addendum)
Julian Palmer here for follow up.  He reports exteme fatigue and shortness of breath with any acitivty.  He also reports feeling dizzy when standing.  He reports he has trouble walking up steps or from his car to his office without resting afterwards for 5 minutes.  He reports he has a frequent cough with a milky colored sputum.  He has lost 10 lbs since 09/21/15 and said he does not have much of an appetite.  He is drinking one boost per day.  Orthostatic vitals taken: bp sitting 114/74, hr 103, bp standing 104/75, hr 115.  The pain in his pelvis is better and rates it at a 4/10 today.  He takes one hydrocodone/acetinophne tablet at night to help him sleep.  He is working full time.  BP 114/74 mmHg  Pulse 103  Temp(Src) 98 F (36.7 C) (Oral)  Ht 6' (1.829 m)  Wt 190 lb 4.8 oz (86.32 kg)  BMI 25.80 kg/m2  SpO2 94%   Wt Readings from Last 3 Encounters:  10/12/15 190 lb 4.8 oz (86.32 kg)  09/21/15 200 lb 3.2 oz (90.81 kg)  09/13/15 203 lb (92.08 kg)

## 2015-10-12 NOTE — Patient Instructions (Signed)

## 2015-10-12 NOTE — Progress Notes (Signed)
Radiation Oncology         [phone] ________________________________  Name: Julian Palmer MRN: [id number]  Date: 10/12/2015  DOB: Apr 14, 1961   Follow-Up Visit Note  CC: Leonel Ramsay, MD  Truitt Merle, MD    ICD-9-CM ICD-10-CM   1. 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  2012: The patient received 45 gray in 25 fractions covering the pelvis/inguinal area along with concurrent chemotherapy. Patient received a boost to the perianal region of 16.2 gray for a cumulative dose to this area of 61.2 gray. ([facility])  Narrative:  The patient returns today for routine follow-up.  He reports exteme fatigue and shortness of breath with any activity. He also reports feeling dizzy when standing. He reports he has trouble walking up steps or from his car to his office without resting afterwards for 5 minutes. He reports he has a frequent cough with milky colored sputum. He has lost 10 lbs since 09/21/15 and said he does not have much of an appetite. He is drinking one boost per day. Orthostatic vitals taken: bp sitting 114/74, hr 103, bp standing 104/75, hr 115. The pain in his pelvis is better and rates it at a 4/10 today. He takes one hydrocodone/acetinophne tablet at night to help him sleep. He is working full time. The patient denies swelling of the extremities. Denies hemoptysis. Denies a fever.  ALLERGIES:  is allergic to sulfa antibiotics.  Meds: Current Outpatient Prescriptions  Medication Sig Dispense Refill  . aspirin (ASPIR-81) 81 MG EC tablet Take 81 mg by mouth at bedtime.     Marland Kitchen atorvastatin (LIPITOR) 80 MG tablet Take 1 tablet (80 mg total) by mouth  every evening. 90 tablet 4  . efavirenz-emtrictabine-tenofovir (ATRIPLA) 600-200-300 MG per tablet Take 1 tablet by mouth at bedtime.     . Empagliflozin-Linagliptin (GLYXAMBI) 25-5 MG TABS Take 1 tablet by mouth daily.    Marland Kitchen glimepiride (AMARYL) 2 MG tablet Take 4 mg by mouth daily with breakfast.     . HYDROcodone-acetaminophen (NORCO/VICODIN) 5-325 MG tablet Take 1 tablet by mouth every 8 (eight) hours as needed for moderate pain. 60 tablet 0  . lidocaine-prilocaine (EMLA) cream Apply topically as needed. Reported on 09/22/2015    . losartan (COZAAR) 100 MG tablet Take 1 tablet (100 mg total) by mouth daily. 90 tablet 4  . metFORMIN (GLUCOPHAGE) 1000 MG tablet Take 1,000 mg by mouth 2 (two) times daily.      Marland Kitchen gabapentin (NEURONTIN) 300 MG capsule Take 1 capsule (300 mg total) by mouth 3 (three) times daily. (Patient not taking: Reported on 10/12/2015) 30 capsule 2  . oxyCODONE-acetaminophen (PERCOCET/ROXICET) 5-325 MG tablet Take 1 tablet by mouth every 8 (eight) hours as needed for moderate pain or severe pain. 30 tablet 0   No current facility-administered medications for this encounter.   Facility-Administered Medications Ordered in Other Encounters  Medication Dose Route Frequency Provider Last Rate Last Dose  . heparin lock flush 100 unit/mL  500 Units Intravenous Once Truitt Merle, MD      . heparin lock flush 100 unit/mL  500 Units Intracatheter Once PRN Truitt Merle,  MD      . sodium chloride 0.9 % injection 10 mL  10 mL Intravenous PRN Truitt Merle, MD      . sodium chloride 0.9 % injection 10 mL  10 mL Intracatheter PRN Truitt Merle, MD      . sodium chloride flush (NS) 0.9 % injection 10 mL  10 mL Intravenous PRN Gery Pray, MD   10 mL at 10/12/15 1630    Physical Findings:  height is 6' (1.829 m) and weight is 190 lb 4.8 oz (86.32 kg). His oral temperature is 98 F (36.7 C). His blood pressure is 104/75 and his pulse is 115. His oxygen saturation is 94%.   The patient is in mild distress.  Patient is alert and oriented. The patient presents to the clinic in a wheelchair. Lower extremity strength: Right proximal lower extremity weakness is 3/5 compared to 5/5 on the left. Lungs are clear to auscultation bilaterally. Heart rate is increased, but regular. No palpable cervical, supraclavicular, or axillary adenopathy.  Lab Findings: Lab Results  Component Value Date   WBC 5.5 10/12/2015   HGB 13.9 10/12/2015   HCT 42.4 10/12/2015   MCV 88.0 10/12/2015   PLT 291 10/12/2015    Radiographic Findings: Ct Angio Chest Pe W/cm &/or Wo Cm  10/12/2015  CLINICAL DATA:  Shortness of breath, cough, some weight loss, history of anal carcinoma diagnosed in 2015 with colostomy, radiation and and chemotherapy EXAM: CT ANGIOGRAPHY CHEST WITH CONTRAST TECHNIQUE: Multidetector CT imaging of the chest was performed using the standard protocol during bolus administration of intravenous contrast. Multiplanar CT image reconstructions and MIPs were obtained to evaluate the vascular anatomy. CONTRAST:  80 cc Isovue 370 COMPARISON:  CT chest of 10/11/2014 FINDINGS: The pulmonary arteries are well opacified. There is no evidence of acute pulmonary embolism. The thoracic aorta also is opacified with no acute abnormality noted. No mediastinal or hilar adenopathy is seen. Coronary artery calcifications are noted primarily in the distribution of the left anterior descending and circumflex arteries. A cyst is noted in the dome of the left lobe of liver. Multiple calcified gallstones are noted within the gallbladder. On lung window image there is patchy ground-glass opacity peripherally in the upper lobes and lower lobes. There are also perilymphatic nodules with some septal thickening and slight nodularity of the interlobular septa, findings which were not present on the prior CT from 07/07/2015. Also there is no distortion of the lung parenchyma. 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 No pleural effusion is seen and no suspicious lung nodule is noted. The central airway is patent. A Port-A-Cath is imbedded in the soft tissues of the left chest with the tip seen to the lower SVC. No bony abnormality is evident. Review of the MIP images confirms the above findings. IMPRESSION: 1. No evidence of acute pulmonary embolism. 2. 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 An atypical infection is also a consideration. 3. Calcified gallstones within the gallbladder. 4. Coronary artery calcifications. Electronically Signed   By: Ivar Drape M.D.   On: 10/12/2015 14:01    Impression:  The patient is recovering from the effects of radiation. The patient suffers from orthostatic hypotension. The patient was also teary-eyed during the encounter.  Plan:  I will order stat bloodwork to rule out anemia, IV fluids for  dehydration,  and a CT scan of the chest with contrast to rule out a potential a pulmonary embolus. I will also prescribe sublingual ativan for his anxiety.  Addendum: The patient returned later in the afternoon. His glucose was 242, but the patient is a Type-2 Diabetic and he did not take his medication this morning. His alkaline phosphatase level was also elevated. Chest angiogram of the chest did not show signs of a pulmonary embolus. However, it did show peribronchial thickening that could either be atypical pneumonia or lymphangitic carcinomatosis. If the patient's cough and shortness a breath continues, a repeat CT scan is warranted in the coming weeks. I also refilled his Percocet.  He will follow-up with medical oncology next month in radiattion oncology in June ____________________________________ -----------------------------------  Blair Promise, PhD, MD  This document serves  as a record of services personally performed by Gery Pray, MD. It was created on his behalf by Darcus Austin, a trained medical scribe. The creation of this record is based on the scribe's personal observations and the provider's statements to them. This document has been checked and approved by the attending provider.

## 2015-10-12 NOTE — Progress Notes (Signed)
  Radiation Oncology         [phone] ________________________________  Name: LADARIS BESSE MRN: [id number]  Date: 10/12/2015  DOB: March 24, 1961  End of Treatment Note  Diagnosis: Recurrent anal carcinoma  Indication for treatment: Pain from osseous metastasis  Radiation treatment dates:   09/01/2015-09/22/2015  Site/dose: The patient was treated with 35 Gy in 14 fractions to the right sacrum.  Beams/energy: IMRT-VMAT, 6X photons  Narrative: The patient tolerated radiation treatment relatively well. The patient continued to have pain with Vorndran improvement to his right hip thus far. However he is walking better at home. He is fatigued, but is continuing to work his usual schedule.  Plan: The patient has completed radiation treatment. The patient will return to radiation oncology clinic for routine followup in one month. I advised them to call or return sooner if they have any questions or concerns related to their recovery or treatment.  -----------------------------------  Blair Promise, PhD, MD  This document serves as a record of services personally performed by Gery Pray, MD. It was created on his behalf by Darcus Austin, a trained medical scribe. The creation of this record is based on the scribe's personal observations and the provider's statements to them. This document has been checked and approved by the attending provider.

## 2015-10-12 NOTE — Progress Notes (Signed)
Pt taken via w/c to radiation oncology to meet with Dr. Sondra Come.

## 2015-10-13 ENCOUNTER — Ambulatory Visit: Payer: Self-pay | Admitting: Radiation Oncology

## 2015-10-14 ENCOUNTER — Telehealth: Payer: Self-pay | Admitting: *Deleted

## 2015-10-14 ENCOUNTER — Ambulatory Visit (HOSPITAL_BASED_OUTPATIENT_CLINIC_OR_DEPARTMENT_OTHER): Payer: Managed Care, Other (non HMO) | Admitting: Nurse Practitioner

## 2015-10-14 ENCOUNTER — Ambulatory Visit (HOSPITAL_BASED_OUTPATIENT_CLINIC_OR_DEPARTMENT_OTHER): Payer: Managed Care, Other (non HMO)

## 2015-10-14 VITALS — BP 113/73 | HR 115 | Temp 97.5°F | Resp 18 | Ht 72.0 in | Wt 189.8 lb

## 2015-10-14 DIAGNOSIS — R0600 Dyspnea, unspecified: Secondary | ICD-10-CM

## 2015-10-14 DIAGNOSIS — C7951 Secondary malignant neoplasm of bone: Secondary | ICD-10-CM | POA: Diagnosis not present

## 2015-10-14 DIAGNOSIS — C211 Malignant neoplasm of anal canal: Secondary | ICD-10-CM

## 2015-10-14 DIAGNOSIS — E860 Dehydration: Secondary | ICD-10-CM | POA: Diagnosis not present

## 2015-10-14 DIAGNOSIS — Z95828 Presence of other vascular implants and grafts: Secondary | ICD-10-CM

## 2015-10-14 LAB — CBC WITH DIFFERENTIAL/PLATELET
BASO%: 0.8 % (ref 0.0–2.0)
Basophils Absolute: 0.1 10*3/uL (ref 0.0–0.1)
EOS ABS: 0.1 10*3/uL (ref 0.0–0.5)
EOS%: 1.4 % (ref 0.0–7.0)
HEMATOCRIT: 39.4 % (ref 38.4–49.9)
HEMOGLOBIN: 13.4 g/dL (ref 13.0–17.1)
LYMPH#: 0.8 10*3/uL — AB (ref 0.9–3.3)
LYMPH%: 12.7 % — AB (ref 14.0–49.0)
MCH: 29.3 pg (ref 27.2–33.4)
MCHC: 34 g/dL (ref 32.0–36.0)
MCV: 86 fL (ref 79.3–98.0)
MONO#: 0.7 10*3/uL (ref 0.1–0.9)
MONO%: 12.5 % (ref 0.0–14.0)
NEUT%: 72.6 % (ref 39.0–75.0)
NEUTROS ABS: 4.3 10*3/uL (ref 1.5–6.5)
PLATELETS: 261 10*3/uL (ref 140–400)
RBC: 4.58 10*6/uL (ref 4.20–5.82)
RDW: 15.3 % — AB (ref 11.0–14.6)
WBC: 5.9 10*3/uL (ref 4.0–10.3)

## 2015-10-14 LAB — COMPREHENSIVE METABOLIC PANEL
ALBUMIN: 3.1 g/dL — AB (ref 3.5–5.0)
ALK PHOS: 235 U/L — AB (ref 40–150)
ALT: 30 U/L (ref 0–55)
ANION GAP: 12 meq/L — AB (ref 3–11)
AST: 30 U/L (ref 5–34)
BILIRUBIN TOTAL: 0.4 mg/dL (ref 0.20–1.20)
BUN: 20.1 mg/dL (ref 7.0–26.0)
CO2: 20 meq/L — AB (ref 22–29)
CREATININE: 1.4 mg/dL — AB (ref 0.7–1.3)
Calcium: 9.5 mg/dL (ref 8.4–10.4)
Chloride: 100 mEq/L (ref 98–109)
EGFR: 54 mL/min/{1.73_m2} — ABNORMAL LOW (ref >90)
Glucose: 264 mg/dl — ABNORMAL HIGH (ref 70–140)
Potassium: 4.6 mEq/L (ref 3.5–5.1)
Sodium: 133 mEq/L — ABNORMAL LOW (ref 136–145)
TOTAL PROTEIN: 8 g/dL (ref 6.4–8.3)

## 2015-10-14 MED ORDER — HEPARIN SOD (PORK) LOCK FLUSH 100 UNIT/ML IV SOLN
500.0000 [IU] | Freq: Once | INTRAVENOUS | Status: AC
  Administered 2015-10-14: 500 [IU] via INTRAVENOUS
  Filled 2015-10-14: qty 5

## 2015-10-14 MED ORDER — SODIUM CHLORIDE 0.9% FLUSH
10.0000 mL | INTRAVENOUS | Status: DC | PRN
  Administered 2015-10-14: 10 mL via INTRAVENOUS
  Filled 2015-10-14: qty 10

## 2015-10-14 MED ORDER — AZITHROMYCIN 500 MG PO TABS: 500.0000 mg | ORAL_TABLET | Freq: Every day | ORAL | Status: DC

## 2015-10-14 MED ORDER — HYDROCODONE-HOMATROPINE 5-1.5 MG/5ML PO SYRP: 5.0000 mL | ORAL_SOLUTION | Freq: Four times a day (QID) | ORAL | Status: DC | PRN

## 2015-10-14 MED ORDER — SODIUM CHLORIDE 0.9 % IV SOLN
INTRAVENOUS | Status: AC
  Administered 2015-10-14: 14:00:00 via INTRAVENOUS

## 2015-10-14 NOTE — Telephone Encounter (Signed)
Received call from pt stating that he is having trouble breathing & saw Dr Sondra Come & had labs, CT & IVF this week but is not feeling any better & wants to be admitted.  Discussed with Dr Burr Medico & Selena Lesser NP & requested pt to come in now.  He is coming from [place] & will try to be here by 12 noon.  POF to schedulers for lab & [facility]/Symptom Management.

## 2015-10-14 NOTE — Progress Notes (Signed)
Ambulated with patient in the office to monitor o2 saturation. Pt had to stop around 59ft unable to continue. O2 saturation dropped to 86% on room air. 2L O2 applied nasal canula. Saturation improved to 94%.

## 2015-10-16 ENCOUNTER — Encounter: Payer: Self-pay | Admitting: Nurse Practitioner

## 2015-10-16 ENCOUNTER — Other Ambulatory Visit: Payer: Self-pay | Admitting: Hematology

## 2015-10-16 DIAGNOSIS — R0600 Dyspnea, unspecified: Secondary | ICD-10-CM | POA: Insufficient documentation

## 2015-10-16 NOTE — Progress Notes (Signed)
SYMPTOM MANAGEMENT CLINIC    Chief Complaint: Shortness of breath.  HPI:  Julian Palmer 55 y.o. male diagnosed with anal cancer with bone metastasis.  Patient is status post chemotherapy and radiation treatments.  Currently undergoing observation only   Patient presented to the [facility] today with complaint of increased dry/nonproductive cough and shortness of breath for the past 1-2 weeks.  He states that he has increased shortness of breath with any exertion whatsoever.  He denies any URI symptoms.  He also denies any recent fevers or chills.  Exam today revealed bilateral breath sounds diminished to all fields.  There was no wheeze noted.  Patient does have a dry cough frequently.  Patient appears short of breath with even conversation.  O2 sat on initial check was 92% on room air.  O2 sat decreased down to 86% on room air with only mild exertion.  O2 was placed in nasal cannula at 2 L.  Patient underwent a CT angiogram of the chest on 10/11/2015, which confirmed progressive lung nodules.  Reviewed all findings with Dr. Burr Medico; and decision was made to prescribe Zithromax 500 mg for a total of 7 days for any infectious process.  Also, patient was prescribed Hycodan cough syrup as well.  Patient will receive approximate 500 mL normal saline IV fluid rehydration today as well.  Have arranged for advanced homecare.  Oxygen; which was delivered while the patient was still at the cancer center.  They also confirmed that patient would have home oxygen delivered with full instructions in less than 2 hours to his home.  After long discussion with Dr.Feng- patient is made the decision to initiate chemotherapy once again.  Will arrange for chemotherapy to be initiated next week.  Patient was advised to call/return or directly to the emergency department for any worsening symptoms whatsoever.    Primary cancer of anal canal ([place])   02/05/2011 Initial Diagnosis Primary cancer of anal canal,  T1N0M0, s/p surgical resection with positive margines. He received definitive dose RT 45 Gy over 5 weeks with 16Gy boosting and concurrent chemo with 5FU, treated at [facility] by Drs.  Geoffreyh and [employer].    11/23/2013 Relapse/Recurrence local recurrence    04/21/2014 Surgery abdominal perianal resection (APR) and VRAM flap closure of perineal wound on 04/21/14, surgical margins were positive for cancer    06/28/2014 - 09/25/2014 Chemotherapy 5-FU 1043m/m2/day on D1-5, ciplatin 1034mm2 on D2, every 28 days, s/p 4 cycles    10/11/2014 Progression CT CAP showed new liver lesions and abd/pelc adenopathy, highly suspecious for progressive metastatic disease    11/02/2014 Imaging abdomen MRI w wo contrast showed new small lesions in the right hepatic lobe with nonspecific enhancement, and enlarging nodes in the porta hepatis, metastatic disease not excluded    02/24/2015 Imaging PET scan showed hypermetabolic bone mets in right sacrum, and small hypermetabolic retroperitoneal lymph nodes, no other abnormal metabolic activity within the liver or other place.   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   04/15/2015 - 08/18/2015 Chemotherapy Nivolumab 240 mg every 2 weeks, stopped due to disease progression    07/07/2015 Imaging PET scan showed interval progression of lytic metastasis involving the righ side of sacrum, pathologic fracture involving the right sacral wing. Improvement in retroperitoneal lymph node metastasis. 7 mm nodule in the left upper lobe lung, indeterminate.   09/01/2015 - 09/22/2015 Radiation Therapy palliative radiation to sacrum, IMRT, 35 Gy in 14 sessions  Review of Systems  Constitutional: Positive for weight loss and malaise/fatigue.  Respiratory: Positive for cough and shortness of breath. Negative for hemoptysis, sputum production and wheezing.   Neurological: Positive for weakness.  Psychiatric/Behavioral: Positive for  depression.  All other systems reviewed and are negative.   Past Medical History  Diagnosis Date  . S/p bare metal coronary artery stent     1998  pLAD  . Hypertension   . 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 Hyperlipidemia   . History of carcinoma in situ of anal canal     2010  . ED (erectile dysfunction)   . Sigmoid diverticulosis   . PONV (postoperative nausea and vomiting)   . Wears glasses   . At risk for sleep apnea     STOP-BANG= 4     SENT TO PCP 03-23-2014  . Coronary artery disease CARDIOLOGIST--  DR Rockey Situ --  LAST LOV 04-17-2013    S/P  PCI to mid and distal LAD and BM stent x1 to pLAD  . Anxiety   . Depression   . HIV positive ([place])   . Type 2 diabetes mellitus ([place])   . Radiation 01/2011    definitive dose RT 45 Gy over 5 weeks with 16Gy boosting  . 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 Primary cancer of anal canal ([place]) 05/18/2011    Past Surgical History  Procedure Laterality Date  . Septoplasty  1985  . Sphincterotomy  2009  . Cardiovascular stress test  04/ 2010    low risk study/  very small region of mildly reduced perfersion is mildly reversible dLAD territory  . Resection anal mass  02-05-2011  . Excision anal polyp  2009  . Re-excision anal squamous cell carcinoma  2010  . Removal rectal mass  01-08-2014  . Eua w/ bx perianal skin  06-08-2009  . Port-a-cath placement  02-21-2011    REMOVED  2013  . Coronary angioplasty with stent placement  1998   [facility]    BM stent to pLAD and PCI to Mid and Distal LAD/  no other significant cad  . Inguinal hernia repair Bilateral 1988  . Colonoscopy with propofol  05-28-2008  . Mass excision N/A 03/26/2014    Procedure: ANAL EXAM UNDER ANESTHESIA,EXCISIONAL BIOPSY OF ANAL MASS;  Surgeon: Leighton Ruff, MD;  Location:  [facility];  Service: General;  Laterality: N/A;  . Laparoscopic assisted abdominal perineal resection N/A 04/22/2014    Procedure: LAPAROSCOPIC  ABDOMINAL PERINEAL RESECTION, ;  Surgeon: Leighton Ruff, MD;  Location: [facility];  Service: General;  Laterality: N/A;  . Urethrotomy Bilateral 04/22/2014    Procedure: CYSTOSCOPY/URETHROTOMY;  Surgeon: Leighton Ruff, MD;  Location: [facility];  Service: General;  Laterality: Bilateral;  . Muscle flap closure N/A 04/22/2014    Procedure: VRAM FLAP;  Surgeon: Irene Limbo, MD;  Location: [facility];  Service: Plastics;  Laterality: N/A;  . 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    is allergic to sulfa antibiotics.    Medication List       This list is accurate as of: 10/14/15 11:59 PM.  Always use your most recent med list.               ASPIR-81 81 MG EC tablet  Generic drug:  aspirin  Take 81 mg by mouth at bedtime.     atorvastatin 80 MG tablet  Commonly known as:  LIPITOR  Take 1 tablet (80 mg total) by mouth every evening.     azithromycin 500 MG tablet  Commonly known as:  ZITHROMAX  Take 1 tablet (500 mg total) by mouth daily.     efavirenz-emtricitabine-tenofovir 600-200-300 MG tablet  Commonly known as:  ATRIPLA  Take 1 tablet by mouth at bedtime.     gabapentin 300 MG capsule  Commonly known as:  NEURONTIN  Take 1 capsule (300 mg total) by mouth 3 (three) times daily.     glimepiride 2 MG tablet  Commonly known as:   AMARYL  Take 4 mg by mouth daily with breakfast.     GLYXAMBI 25-5 MG Tabs  Generic drug:  Empagliflozin-Linagliptin  Take 1 tablet by mouth daily.     HYDROcodone-acetaminophen 5-325 MG tablet  Commonly known as:  NORCO/VICODIN  Take 1 tablet by mouth every 8 (eight) hours as needed for moderate pain.     HYDROcodone-homatropine 5-1.5 MG/5ML syrup  Commonly known as:  HYCODAN  Take 5 mLs by mouth every 6 (six) hours as needed for cough.     lidocaine-prilocaine cream  Commonly known as:  EMLA  Apply topically as needed. Reported on 09/22/2015     losartan 100 MG tablet  Commonly known as:  COZAAR  Take 1 tablet (100 mg total) by mouth daily.     metFORMIN 1000 MG tablet  Commonly known as:  GLUCOPHAGE  Take 1,000 mg by mouth 2 (two) times daily.     oxyCODONE-acetaminophen 5-325 MG tablet  Commonly known as:  PERCOCET/ROXICET  Take 1 tablet by mouth every 8 (eight) hours as needed for moderate pain or severe pain.         PHYSICAL EXAMINATION  Oncology Vitals 10/14/2015 10/12/2015  Height 183 cm -  Weight 86.093 kg -  Weight (lbs) 189 lbs 13 oz -  BMI (kg/m2) 25.74 kg/m2 -  Temp 97.5 -  Pulse 115 103  Resp 18 -  SpO2 92 -  BSA (m2) 2.09 m2 -   BP Readings from Last 2 Encounters:  10/14/15 113/73  10/12/15 114/68    Physical Exam  Constitutional: He is oriented to person, place, and time. He appears malnourished. He appears unhealthy. He appears cachectic.  HENT:  Head: Normocephalic and atraumatic.  Mouth/Throat: Oropharynx is clear and moist.  Eyes: Conjunctivae and EOM are normal. Pupils are equal, round, and reactive to light. Right eye exhibits no discharge. Left eye exhibits no discharge. No scleral icterus.  Neck: Normal range of motion. Neck supple. No JVD present. No tracheal deviation present. No thyromegaly present.  Cardiovascular: Normal rate, regular rhythm, normal heart sounds and intact distal pulses.   Pulmonary/Chest: No stridor. No  respiratory distress. He has no wheezes. He has no rales. He exhibits no tenderness.  Patient appears to have diminished breath sounds bilaterally; and a dry cough.  Patient appears short of breath; but no acute respiratory  distress.  Abdominal: Soft. Bowel sounds are normal. He exhibits no distension and no mass. There is no tenderness. There is no rebound and no guarding.  Musculoskeletal: Normal range of motion. He exhibits no edema or tenderness.  Lymphadenopathy:    He has no cervical adenopathy.  Neurological: He is alert and oriented to person, place, and time. Gait normal.  Skin: Skin is warm and dry. No rash noted. No erythema. No pallor.  Psychiatric: Affect normal.  Nursing note and vitals reviewed.   LABORATORY DATA:. Appointment on 10/14/2015  Component Date Value Ref Range Status  . WBC 10/14/2015 5.9  4.0 - 10.3 10e3/uL Final  . NEUT# 10/14/2015 4.3  1.5 - 6.5 10e3/uL Final  . HGB 10/14/2015 13.4  13.0 - 17.1 g/dL Final  . HCT 10/14/2015 39.4  38.4 - 49.9 % Final  . Platelets 10/14/2015 261  140 - 400 10e3/uL Final  . MCV 10/14/2015 86.0  79.3 - 98.0 fL Final  . MCH 10/14/2015 29.3  27.2 - 33.4 pg Final  . MCHC 10/14/2015 34.0  32.0 - 36.0 g/dL Final  . RBC 10/14/2015 4.58  4.20 - 5.82 10e6/uL Final  . RDW 10/14/2015 15.3* 11.0 - 14.6 % Final  . lymph# 10/14/2015 0.8* 0.9 - 3.3 10e3/uL Final  . MONO# 10/14/2015 0.7  0.1 - 0.9 10e3/uL Final  . Eosinophils Absolute 10/14/2015 0.1  0.0 - 0.5 10e3/uL Final  . Basophils Absolute 10/14/2015 0.1  0.0 - 0.1 10e3/uL Final  . NEUT% 10/14/2015 72.6  39.0 - 75.0 % Final  . LYMPH% 10/14/2015 12.7* 14.0 - 49.0 % Final  . MONO% 10/14/2015 12.5  0.0 - 14.0 % Final  . EOS% 10/14/2015 1.4  0.0 - 7.0 % Final  . BASO% 10/14/2015 0.8  0.0 - 2.0 % Final  . Sodium 10/14/2015 133* 136 - 145 mEq/L Final  . Potassium 10/14/2015 4.6  3.5 - 5.1 mEq/L Final  . Chloride 10/14/2015 100  98 - 109 mEq/L Final  . CO2 10/14/2015 20* 22 - 29 mEq/L  Final  . Glucose 10/14/2015 264* 70 - 140 mg/dl Final   Glucose reference range is for nonfasting patients. Fasting glucose reference range is 70- 100.  Marland Kitchen BUN 10/14/2015 20.1  7.0 - 26.0 mg/dL Final  . Creatinine 10/14/2015 1.4* 0.7 - 1.3 mg/dL Final  . Total Bilirubin 10/14/2015 0.40  0.20 - 1.20 mg/dL Final  . Alkaline Phosphatase 10/14/2015 235* 40 - 150 U/L Final  . AST 10/14/2015 30  5 - 34 U/L Final  . ALT 10/14/2015 30  0 - 55 U/L Final  . Total Protein 10/14/2015 8.0  6.4 - 8.3 g/dL Final  . Albumin 10/14/2015 3.1* 3.5 - 5.0 g/dL Final  . Calcium 10/14/2015 9.5  8.4 - 10.4 mg/dL Final  . Anion Gap 10/14/2015 12* 3 - 11 mEq/L Final  . EGFR 10/14/2015 54* >90 ml/min/1.73 m2 Final   eGFR is calculated using the CKD-EPI Creatinine Equation (2009)  Hospital Outpatient Visit on 10/12/2015  Component Date Value Ref Range Status  . WBC 10/12/2015 5.5  4.0 - 10.3 10e3/uL Final  . NEUT# 10/12/2015 4.0  1.5 - 6.5 10e3/uL Final  . HGB 10/12/2015 13.9  13.0 - 17.1 g/dL Final  . HCT 10/12/2015 42.4  38.4 - 49.9 % Final  . Platelets 10/12/2015 291  140 - 400 10e3/uL Final  . MCV 10/12/2015 88.0  79.3 - 98.0 fL Final  . MCH 10/12/2015 28.8  27.2 - 33.4 pg Final  . MCHC  10/12/2015 32.7  32.0 - 36.0 g/dL Final  . RBC 10/12/2015 4.82  4.20 - 5.82 10e6/uL Final  . RDW 10/12/2015 16.0* 11.0 - 14.6 % Final  . lymph# 10/12/2015 0.5* 0.9 - 3.3 10e3/uL Final  . MONO# 10/12/2015 0.7  0.1 - 0.9 10e3/uL Final  . Eosinophils Absolute 10/12/2015 0.1  0.0 - 0.5 10e3/uL Final  . Basophils Absolute 10/12/2015 0.1  0.0 - 0.1 10e3/uL Final  . NEUT% 10/12/2015 73.4  39.0 - 75.0 % Final  . LYMPH% 10/12/2015 9.3* 14.0 - 49.0 % Final  . MONO% 10/12/2015 13.4  0.0 - 14.0 % Final  . EOS% 10/12/2015 2.6  0.0 - 7.0 % Final  . BASO% 10/12/2015 1.3  0.0 - 2.0 % Final  . Sodium 10/12/2015 134* 136 - 145 mEq/L Final  . Potassium 10/12/2015 5.1  3.5 - 5.1 mEq/L Final  . Chloride 10/12/2015 101  98 - 109 mEq/L Final    . CO2 10/12/2015 21* 22 - 29 mEq/L Final  . Glucose 10/12/2015 242* 70 - 140 mg/dl Final   Glucose reference range is for nonfasting patients. Fasting glucose reference range is 70- 100.  Marland Kitchen BUN 10/12/2015 23.9  7.0 - 26.0 mg/dL Final  . Creatinine 10/12/2015 1.3  0.7 - 1.3 mg/dL Final  . Total Bilirubin 10/12/2015 0.36  0.20 - 1.20 mg/dL Final  . Alkaline Phosphatase 10/12/2015 233* 40 - 150 U/L Final  . AST 10/12/2015 33  5 - 34 U/L Final  . ALT 10/12/2015 36  0 - 55 U/L Final  . Total Protein 10/12/2015 8.4* 6.4 - 8.3 g/dL Final  . Albumin 10/12/2015 3.2* 3.5 - 5.0 g/dL Final  . Calcium 10/12/2015 10.0  8.4 - 10.4 mg/dL Final  . Anion Gap 10/12/2015 12* 3 - 11 mEq/L Final  . EGFR 10/12/2015 60* >90 ml/min/1.73 m2 Final   eGFR is calculated using the CKD-EPI Creatinine Equation (2009)    RADIOGRAPHIC STUDIES: No results found.  ASSESSMENT/PLAN:    Dyspnea Patient presented to the [facility] today with complaint of increased dry/nonproductive cough and shortness of breath for the past 1-2 weeks.  He states that he has increased shortness of breath with any exertion whatsoever.  He denies any URI symptoms.  He also denies any recent fevers or chills.  Exam today revealed bilateral breath sounds diminished to all fields.  There was no wheeze noted.  Patient does have a dry cough frequently.  Patient appears short of breath with even conversation.  O2 sat on initial check was 92% on room air.  O2 sat decreased down to 86% on room air with only mild exertion.  O2 was placed in nasal cannula at 2 L.  Patient underwent a CT angiogram of the chest on 10/11/2015, which confirmed progressive lung nodules.  Reviewed all findings with Dr. Burr Medico; and decision was made to prescribe Zithromax 500 mg for a total of 7 days for any infectious process.  Also, patient was prescribed Hycodan cough syrup as well.  Patient will receive approximate 500 mL normal saline IV fluid rehydration today as  well.  Have arranged for advanced homecare.  Oxygen; which was delivered while the patient was still at the cancer center.  They also confirmed that patient would have home oxygen delivered with full instructions in less than 2 hours to his home.  After long discussion with Dr.Feng- patient is made the decision to initiate chemotherapy once again.  Will arrange for chemotherapy to be initiated next week.  Patient  was advised to call/return or directly to the emergency department for any worsening symptoms whatsoever.  Primary cancer of anal canal [facility]) Patient completed his last nivolumab immunotherapy 08/18/15.  He completed his last palliatibve radiation treatment on 09/22/15. He has been undergoing observation only since that time.  Recent CT angiogram of the chest revealed increasing pulmonary nodules.  Patient is complaining of increased shortness of breath this past week or so as well.  See further notes for details.  Patient has 30 been scheduled for labs, flush, and visit on 11/03/2015.  Patient has made the decision to resume chemotherapy; and will be scheduled for chemotherapy within the next week.   Patient stated understanding of all instructions; and was in agreement with this plan of care. The patient knows to call the clinic with any problems, questions or concerns.   Total time spent with patient was 40 minutes;  with greater than 75 percent of that time spent in face to face counseling regarding patient's symptoms,  and coordination of care and follow up.  Disclaimer:This dictation was prepared with Dragon/digital dictation along with [employer]. Any transcriptional errors that result from this process are unintentional.  Drue Second, NP 10/16/2015   Attending addendum I have seen the patient, examined him. I agree with the assessment and and plan and have edited the notes.   I have reviewed his recent chest CT findings, which is very suspicious for  metastatic disease from his anal cancer, much less likely this is atypical infection. I recommend a course of antibiotics azithromycin which covers atypical pneumonia.   I discussed the treatment option for his metastatic anal cancer. We discussed there is no standard chemo regimen after cisplatin/5-fu, but it would be reasonable to try FOLFOX/CAPOX or carboplatin and paclitaxel, although the response rate will likely to be low. Due to his severe skin reaction to 5-fu, I would favor carb and Taxel, which is currently being evaluated in clinical trial (comparing to cis/5-fu as first line). The potencial benefit and side effects were discussed with him and he agrees to try.   We also discussed alternative option of palliative care alone, which was his preference previously. After a lengthy discussion, he decided to try chemo first. I have previously contact [place] and did not find a clinical trial option for him.   Home care with oxygen delivery was set up today.  I will see him back next week to start chemo/   Truitt Merle MD

## 2015-10-16 NOTE — Assessment & Plan Note (Signed)
Patient completed his last nivolumab immunotherapy 08/18/15.  He completed his last palliatibve radiation treatment on 09/22/15. He has been undergoing observation only since that time.  Recent CT angiogram of the chest revealed increasing pulmonary nodules.  Patient is complaining of increased shortness of breath this past week or so as well.  See further notes for details.  Patient has 30 been scheduled for labs, flush, and visit on 11/03/2015.  Patient has made the decision to resume chemotherapy; and will be scheduled for chemotherapy within the next week.

## 2015-10-16 NOTE — Assessment & Plan Note (Signed)
Patient presented to the [facility] today with complaint of increased dry/nonproductive cough and shortness of breath for the past 1-2 weeks.  He states that he has increased shortness of breath with any exertion whatsoever.  He denies any URI symptoms.  He also denies any recent fevers or chills.  Exam today revealed bilateral breath sounds diminished to all fields.  There was no wheeze noted.  Patient does have a dry cough frequently.  Patient appears short of breath with even conversation.  O2 sat on initial check was 92% on room air.  O2 sat decreased down to 86% on room air with only mild exertion.  O2 was placed in nasal cannula at 2 L.  Patient underwent a CT angiogram of the chest on 10/11/2015, which confirmed progressive lung nodules.  Reviewed all findings with Dr. Burr Medico; and decision was made to prescribe Zithromax 500 mg for a total of 7 days for any infectious process.  Also, patient was prescribed Hycodan cough syrup as well.  Patient will receive approximate 500 mL normal saline IV fluid rehydration today as well.  Have arranged for advanced homecare.  Oxygen; which was delivered while the patient was still at the cancer center.  They also confirmed that patient would have home oxygen delivered with full instructions in less than 2 hours to his home.  After long discussion with Dr.Feng- patient is made the decision to initiate chemotherapy once again.  Will arrange for chemotherapy to be initiated next week.  Patient was advised to call/return or directly to the emergency department for any worsening symptoms whatsoever.

## 2015-10-17 ENCOUNTER — Telehealth: Payer: Self-pay | Admitting: Hematology

## 2015-10-17 NOTE — Telephone Encounter (Signed)
s.w. pt and advised on April and may appt....pt ok and aware

## 2015-10-20 ENCOUNTER — Encounter: Payer: Self-pay | Admitting: Hematology

## 2015-10-20 ENCOUNTER — Other Ambulatory Visit (HOSPITAL_BASED_OUTPATIENT_CLINIC_OR_DEPARTMENT_OTHER): Payer: Managed Care, Other (non HMO)

## 2015-10-20 ENCOUNTER — Other Ambulatory Visit: Payer: Self-pay | Admitting: *Deleted

## 2015-10-20 ENCOUNTER — Ambulatory Visit (HOSPITAL_BASED_OUTPATIENT_CLINIC_OR_DEPARTMENT_OTHER): Payer: Managed Care, Other (non HMO) | Admitting: Hematology

## 2015-10-20 ENCOUNTER — Ambulatory Visit (HOSPITAL_BASED_OUTPATIENT_CLINIC_OR_DEPARTMENT_OTHER): Payer: Managed Care, Other (non HMO)

## 2015-10-20 ENCOUNTER — Telehealth: Payer: Self-pay | Admitting: Hematology

## 2015-10-20 VITALS — BP 122/76 | HR 91 | Temp 97.4°F | Resp 20

## 2015-10-20 DIAGNOSIS — Z794 Long term (current) use of insulin: Secondary | ICD-10-CM

## 2015-10-20 DIAGNOSIS — Z5111 Encounter for antineoplastic chemotherapy: Secondary | ICD-10-CM

## 2015-10-20 DIAGNOSIS — C211 Malignant neoplasm of anal canal: Secondary | ICD-10-CM

## 2015-10-20 DIAGNOSIS — C7951 Secondary malignant neoplasm of bone: Secondary | ICD-10-CM

## 2015-10-20 DIAGNOSIS — E1165 Type 2 diabetes mellitus with hyperglycemia: Secondary | ICD-10-CM

## 2015-10-20 DIAGNOSIS — C7801 Secondary malignant neoplasm of right lung: Secondary | ICD-10-CM | POA: Diagnosis not present

## 2015-10-20 DIAGNOSIS — F329 Major depressive disorder, single episode, unspecified: Secondary | ICD-10-CM

## 2015-10-20 DIAGNOSIS — Z21 Asymptomatic human immunodeficiency virus [HIV] infection status: Secondary | ICD-10-CM

## 2015-10-20 DIAGNOSIS — I10 Essential (primary) hypertension: Secondary | ICD-10-CM

## 2015-10-20 DIAGNOSIS — C7802 Secondary malignant neoplasm of left lung: Secondary | ICD-10-CM

## 2015-10-20 DIAGNOSIS — I2510 Atherosclerotic heart disease of native coronary artery without angina pectoris: Secondary | ICD-10-CM

## 2015-10-20 DIAGNOSIS — B20 Human immunodeficiency virus [HIV] disease: Secondary | ICD-10-CM

## 2015-10-20 DIAGNOSIS — IMO0001 Reserved for inherently not codable concepts without codable children: Secondary | ICD-10-CM

## 2015-10-20 DIAGNOSIS — F32A Depression, unspecified: Secondary | ICD-10-CM

## 2015-10-20 LAB — CBC WITH DIFFERENTIAL/PLATELET
BASO%: 1.1 % (ref 0.0–2.0)
Basophils Absolute: 0.1 10*3/uL (ref 0.0–0.1)
EOS ABS: 0.1 10*3/uL (ref 0.0–0.5)
EOS%: 0.9 % (ref 0.0–7.0)
HCT: 41.3 % (ref 38.4–49.9)
HGB: 13.8 g/dL (ref 13.0–17.1)
LYMPH%: 4.7 % — AB (ref 14.0–49.0)
MCH: 29.4 pg (ref 27.2–33.4)
MCHC: 33.5 g/dL (ref 32.0–36.0)
MCV: 87.8 fL (ref 79.3–98.0)
MONO#: 0.9 10*3/uL (ref 0.1–0.9)
MONO%: 11.5 % (ref 0.0–14.0)
NEUT%: 81.8 % — ABNORMAL HIGH (ref 39.0–75.0)
NEUTROS ABS: 6.5 10*3/uL (ref 1.5–6.5)
Platelets: 281 10*3/uL (ref 140–400)
RBC: 4.7 10*6/uL (ref 4.20–5.82)
RDW: 15.8 % — ABNORMAL HIGH (ref 11.0–14.6)
WBC: 8 10*3/uL (ref 4.0–10.3)
lymph#: 0.4 10*3/uL — ABNORMAL LOW (ref 0.9–3.3)

## 2015-10-20 LAB — COMPREHENSIVE METABOLIC PANEL
ALT: 31 U/L (ref 0–55)
AST: 29 U/L (ref 5–34)
Albumin: 3.1 g/dL — ABNORMAL LOW (ref 3.5–5.0)
Alkaline Phosphatase: 215 U/L — ABNORMAL HIGH (ref 40–150)
Anion Gap: 13 mEq/L — ABNORMAL HIGH (ref 3–11)
BILIRUBIN TOTAL: 0.39 mg/dL (ref 0.20–1.20)
BUN: 23.3 mg/dL (ref 7.0–26.0)
CHLORIDE: 100 meq/L (ref 98–109)
CO2: 21 meq/L — AB (ref 22–29)
Calcium: 9.6 mg/dL (ref 8.4–10.4)
Creatinine: 1.4 mg/dL — ABNORMAL HIGH (ref 0.7–1.3)
EGFR: 58 mL/min/{1.73_m2} — AB (ref >90)
GLUCOSE: 238 mg/dL — AB (ref 70–140)
Potassium: 4.3 mEq/L (ref 3.5–5.1)
SODIUM: 134 meq/L — AB (ref 136–145)
TOTAL PROTEIN: 8.1 g/dL (ref 6.4–8.3)

## 2015-10-20 MED ORDER — PALONOSETRON HCL INJECTION 0.25 MG/5ML
INTRAVENOUS | Status: AC
  Filled 2015-10-20: qty 5

## 2015-10-20 MED ORDER — FAMOTIDINE IN NACL 20-0.9 MG/50ML-% IV SOLN
INTRAVENOUS | Status: AC
  Filled 2015-10-20: qty 50

## 2015-10-20 MED ORDER — SODIUM CHLORIDE 0.9% FLUSH
10.0000 mL | INTRAVENOUS | Status: DC | PRN
  Administered 2015-10-20: 10 mL
  Filled 2015-10-20: qty 10

## 2015-10-20 MED ORDER — DIPHENHYDRAMINE HCL 50 MG/ML IJ SOLN
50.0000 mg | Freq: Once | INTRAMUSCULAR | Status: AC
  Administered 2015-10-20: 50 mg via INTRAVENOUS

## 2015-10-20 MED ORDER — PALONOSETRON HCL INJECTION 0.25 MG/5ML
0.2500 mg | Freq: Once | INTRAVENOUS | Status: AC
  Administered 2015-10-20: 0.25 mg via INTRAVENOUS

## 2015-10-20 MED ORDER — FAMOTIDINE IN NACL 20-0.9 MG/50ML-% IV SOLN
20.0000 mg | Freq: Once | INTRAVENOUS | Status: AC
  Administered 2015-10-20: 20 mg via INTRAVENOUS

## 2015-10-20 MED ORDER — SODIUM CHLORIDE 0.9 % IV SOLN
80.0000 mg/m2 | Freq: Once | INTRAVENOUS | Status: AC
  Administered 2015-10-20: 168 mg via INTRAVENOUS
  Filled 2015-10-20: qty 28

## 2015-10-20 MED ORDER — PROCHLORPERAZINE MALEATE 10 MG PO TABS: 10.0000 mg | ORAL_TABLET | Freq: Four times a day (QID) | ORAL | Status: DC | PRN

## 2015-10-20 MED ORDER — SODIUM CHLORIDE 0.9 % IV SOLN
Freq: Once | INTRAVENOUS | Status: AC
  Administered 2015-10-20: 11:00:00 via INTRAVENOUS

## 2015-10-20 MED ORDER — HEPARIN SOD (PORK) LOCK FLUSH 100 UNIT/ML IV SOLN
500.0000 [IU] | Freq: Once | INTRAVENOUS | Status: AC | PRN
Start: 2015-10-20 — End: 2015-10-20
  Administered 2015-10-20: 500 [IU]
  Filled 2015-10-20: qty 5

## 2015-10-20 MED ORDER — DIPHENHYDRAMINE HCL 50 MG/ML IJ SOLN
INTRAMUSCULAR | Status: AC
  Filled 2015-10-20: qty 1

## 2015-10-20 MED ORDER — SODIUM CHLORIDE 0.9 % IV SOLN
20.0000 mg | Freq: Once | INTRAVENOUS | Status: AC
  Administered 2015-10-20: 20 mg via INTRAVENOUS
  Filled 2015-10-20: qty 2

## 2015-10-20 MED ORDER — ONDANSETRON HCL 8 MG PO TABS: 8.0000 mg | ORAL_TABLET | Freq: Two times a day (BID) | ORAL | Status: DC | PRN

## 2015-10-20 MED ORDER — SODIUM CHLORIDE 0.9 % IV SOLN
195.2000 mg | Freq: Once | INTRAVENOUS | Status: AC
  Administered 2015-10-20: 200 mg via INTRAVENOUS
  Filled 2015-10-20: qty 20

## 2015-10-20 NOTE — Patient Instructions (Signed)
[facility] Discharge Instructions for Patients Receiving Chemotherapy  Today you received the following chemotherapy agents taxol/carboplatin  To help prevent nausea and vomiting after your treatment, we encourage you to take your nausea medication as directed: DO NOT take ondansetron (zofran) at home for next 3 days.     If you develop nausea and vomiting that is not controlled by your nausea medication, call the clinic.   BELOW ARE SYMPTOMS THAT SHOULD BE REPORTED IMMEDIATELY:  *FEVER GREATER THAN 100.5 F  *CHILLS WITH OR WITHOUT FEVER  NAUSEA AND VOMITING THAT IS NOT CONTROLLED WITH YOUR NAUSEA MEDICATION  *UNUSUAL SHORTNESS OF BREATH  *UNUSUAL BRUISING OR BLEEDING  TENDERNESS IN MOUTH AND THROAT WITH OR WITHOUT PRESENCE OF ULCERS  *URINARY PROBLEMS  *BOWEL PROBLEMS  UNUSUAL RASH Items with * indicate a potential emergency and should be followed up as soon as possible.  Feel free to call the clinic you have any questions or concerns. The clinic phone number is [phone].  Paclitaxel injection What is this medicine? PACLITAXEL (PAK li TAX el) is a chemotherapy drug. It targets fast dividing cells, like cancer cells, and causes these cells to die. This medicine is used to treat ovarian cancer, breast cancer, and other cancers. This medicine may be used for other purposes; ask your health care provider or pharmacist if you have questions. What should I tell my health care provider before I take this medicine? They need to know if you have any of these conditions: -blood disorders -irregular heartbeat -infection (especially a virus infection such as chickenpox, cold sores, or herpes) -liver disease -previous or ongoing radiation therapy -an unusual or allergic reaction to paclitaxel, alcohol, polyoxyethylated castor oil, other chemotherapy agents, other medicines, foods, dyes, or preservatives -pregnant or trying to get pregnant -breast-feeding How  should I use this medicine? This drug is given as an infusion into a vein. It is administered in a hospital or clinic by a specially trained health care professional. Talk to your pediatrician regarding the use of this medicine in children. Special care may be needed. Overdosage: If you think you have taken too much of this medicine contact a poison control center or emergency room at once. NOTE: This medicine is only for you. Do not share this medicine with others. What if I miss a dose? It is important not to miss your dose. Call your doctor or health care professional if you are unable to keep an appointment. What may interact with this medicine? Do not take this medicine with any of the following medications: -disulfiram -metronidazole This medicine may also interact with the following medications: -cyclosporine -diazepam -ketoconazole -medicines to increase blood counts like filgrastim, pegfilgrastim, sargramostim -other chemotherapy drugs like cisplatin, doxorubicin, epirubicin, etoposide, teniposide, vincristine -quinidine -testosterone -vaccines -verapamil Talk to your doctor or health care professional before taking any of these medicines: -acetaminophen -aspirin -ibuprofen -ketoprofen -naproxen This list may not describe all possible interactions. Give your health care provider a list of all the medicines, herbs, non-prescription drugs, or dietary supplements you use. Also tell them if you smoke, drink alcohol, or use illegal drugs. Some items may interact with your medicine. What should I watch for while using this medicine? Your condition will be monitored carefully while you are receiving this medicine. You will need important blood work done while you are taking this medicine. This drug may make you feel generally unwell. This is not uncommon, as chemotherapy can affect healthy cells as well as cancer cells. Report any  side effects. Continue your course of treatment even  though you feel ill unless your doctor tells you to stop. This medicine can cause serious allergic reactions. To reduce your risk you will need to take other medicine(s) before treatment with this medicine. In some cases, you may be given additional medicines to help with side effects. Follow all directions for their use. Call your doctor or health care professional for advice if you get a fever, chills or sore throat, or other symptoms of a cold or flu. Do not treat yourself. This drug decreases your body's ability to fight infections. Try to avoid being around people who are sick. This medicine may increase your risk to bruise or bleed. Call your doctor or health care professional if you notice any unusual bleeding. Be careful brushing and flossing your teeth or using a toothpick because you may get an infection or bleed more easily. If you have any dental work done, tell your dentist you are receiving this medicine. Avoid taking products that contain aspirin, acetaminophen, ibuprofen, naproxen, or ketoprofen unless instructed by your doctor. These medicines may hide a fever. Do not become pregnant while taking this medicine. Women should inform their doctor if they wish to become pregnant or think they might be pregnant. There is a potential for serious side effects to an unborn child. Talk to your health care professional or pharmacist for more information. Do not breast-feed an infant while taking this medicine. Men are advised not to father a child while receiving this medicine. This product may contain alcohol. Ask your pharmacist or healthcare provider if this medicine contains alcohol. Be sure to tell all healthcare providers you are taking this medicine. Certain medicines, like metronidazole and disulfiram, can cause an unpleasant reaction when taken with alcohol. The reaction includes flushing, headache, nausea, vomiting, sweating, and increased thirst. The reaction can last from 30 minutes to  several hours. What side effects may I notice from receiving this medicine? Side effects that you should report to your doctor or health care professional as soon as possible: -allergic reactions like skin rash, itching or hives, swelling of the face, lips, or tongue -low blood counts - This drug may decrease the number of white blood cells, red blood cells and platelets. You may be at increased risk for infections and bleeding. -signs of infection - fever or chills, cough, sore throat, pain or difficulty passing urine -signs of decreased platelets or bleeding - bruising, pinpoint red spots on the skin, black, tarry stools, nosebleeds -signs of decreased red blood cells - unusually weak or tired, fainting spells, lightheadedness -breathing problems -chest pain -high or low blood pressure -mouth sores -nausea and vomiting -pain, swelling, redness or irritation at the injection site -pain, tingling, numbness in the hands or feet -slow or irregular heartbeat -swelling of the ankle, feet, hands Side effects that usually do not require medical attention (report to your doctor or health care professional if they continue or are bothersome): -bone pain -complete hair loss including hair on your head, underarms, pubic hair, eyebrows, and eyelashes -changes in the color of fingernails -diarrhea -loosening of the fingernails -loss of appetite -muscle or joint pain -red flush to skin -sweating This list may not describe all possible side effects. Call your doctor for medical advice about side effects. You may report side effects to FDA at 1-800-FDA-1088. Where should I keep my medicine? This drug is given in a hospital or clinic and will not be stored at home. NOTE: This sheet is  a summary. It may not cover all possible information. If you have questions about this medicine, talk to your doctor, pharmacist, or health care provider.    2016, [employer]. (2015-01-27  13:02:56)  Carboplatin injection What is this medicine? CARBOPLATIN (KAR boe pla tin) is a chemotherapy drug. It targets fast dividing cells, like cancer cells, and causes these cells to die. This medicine is used to treat ovarian cancer and many other cancers. This medicine may be used for other purposes; ask your health care provider or pharmacist if you have questions. What should I tell my health care provider before I take this medicine? They need to know if you have any of these conditions: -blood disorders -hearing problems -kidney disease -recent or ongoing radiation therapy -an unusual or allergic reaction to carboplatin, cisplatin, other chemotherapy, other medicines, foods, dyes, or preservatives -pregnant or trying to get pregnant -breast-feeding How should I use this medicine? This drug is usually given as an infusion into a vein. It is administered in a hospital or clinic by a specially trained health care professional. Talk to your pediatrician regarding the use of this medicine in children. Special care may be needed. Overdosage: If you think you have taken too much of this medicine contact a poison control center or emergency room at once. NOTE: This medicine is only for you. Do not share this medicine with others. What if I miss a dose? It is important not to miss a dose. Call your doctor or health care professional if you are unable to keep an appointment. What may interact with this medicine? -medicines for seizures -medicines to increase blood counts like filgrastim, pegfilgrastim, sargramostim -some antibiotics like amikacin, gentamicin, neomycin, streptomycin, tobramycin -vaccines Talk to your doctor or health care professional before taking any of these medicines: -acetaminophen -aspirin -ibuprofen -ketoprofen -naproxen This list may not describe all possible interactions. Give your health care provider a list of all the medicines, herbs, non-prescription  drugs, or dietary supplements you use. Also tell them if you smoke, drink alcohol, or use illegal drugs. Some items may interact with your medicine. What should I watch for while using this medicine? Your condition will be monitored carefully while you are receiving this medicine. You will need important blood work done while you are taking this medicine. This drug may make you feel generally unwell. This is not uncommon, as chemotherapy can affect healthy cells as well as cancer cells. Report any side effects. Continue your course of treatment even though you feel ill unless your doctor tells you to stop. In some cases, you may be given additional medicines to help with side effects. Follow all directions for their use. Call your doctor or health care professional for advice if you get a fever, chills or sore throat, or other symptoms of a cold or flu. Do not treat yourself. This drug decreases your body's ability to fight infections. Try to avoid being around people who are sick. This medicine may increase your risk to bruise or bleed. Call your doctor or health care professional if you notice any unusual bleeding. Be careful brushing and flossing your teeth or using a toothpick because you may get an infection or bleed more easily. If you have any dental work done, tell your dentist you are receiving this medicine. Avoid taking products that contain aspirin, acetaminophen, ibuprofen, naproxen, or ketoprofen unless instructed by your doctor. These medicines may hide a fever. Do not become pregnant while taking this medicine. Women should inform their doctor  if they wish to become pregnant or think they might be pregnant. There is a potential for serious side effects to an unborn child. Talk to your health care professional or pharmacist for more information. Do not breast-feed an infant while taking this medicine. What side effects may I notice from receiving this medicine? Side effects that you should  report to your doctor or health care professional as soon as possible: -allergic reactions like skin rash, itching or hives, swelling of the face, lips, or tongue -signs of infection - fever or chills, cough, sore throat, pain or difficulty passing urine -signs of decreased platelets or bleeding - bruising, pinpoint red spots on the skin, black, tarry stools, nosebleeds -signs of decreased red blood cells - unusually weak or tired, fainting spells, lightheadedness -breathing problems -changes in hearing -changes in vision -chest pain -high blood pressure -low blood counts - This drug may decrease the number of white blood cells, red blood cells and platelets. You may be at increased risk for infections and bleeding. -nausea and vomiting -pain, swelling, redness or irritation at the injection site -pain, tingling, numbness in the hands or feet -problems with balance, talking, walking -trouble passing urine or change in the amount of urine Side effects that usually do not require medical attention (report to your doctor or health care professional if they continue or are bothersome): -hair loss -loss of appetite -metallic taste in the mouth or changes in taste This list may not describe all possible side effects. Call your doctor for medical advice about side effects. You may report side effects to FDA at 1-800-FDA-1088. Where should I keep my medicine? This drug is given in a hospital or clinic and will not be stored at home. NOTE: This sheet is a summary. It may not cover all possible information. If you have questions about this medicine, talk to your doctor, pharmacist, or health care provider.    2016, [employer]. (2007-09-16 14:38:05)

## 2015-10-20 NOTE — Telephone Encounter (Signed)
Appt made and avs will print in treatment room °

## 2015-10-20 NOTE — Progress Notes (Signed)
[facility] NOTE  Patient Care Team: Leonel Ramsay, MD as PCP - General (Infectious Diseases) Minna Merritts, MD as Consulting Physician (Cardiology) Robert Bellow, MD (General Surgery) Leighton Ruff, MD as Consulting Physician (General Surgery) Irene Limbo, MD as Consulting Physician (Plastic Surgery) Truitt Merle, MD as Consulting Physician (Hematology) Festus Aloe, MD as Consulting Physician (Urology) Gery Pray, MD as Consulting Physician (Radiation Oncology)  DIAGNOSIS Follow-up recurrent anal cancer    Primary cancer of anal canal ([place])   02/05/2011 Initial Diagnosis Primary cancer of anal canal, T1N0M0, s/p surgical resection with positive margines. He received definitive dose RT 45 Gy over 5 weeks with 16Gy boosting and concurrent chemo with 5FU, treated at [facility] by Drs.  Geoffreyh and [employer].    11/23/2013 Relapse/Recurrence local recurrence    04/21/2014 Surgery abdominal perianal resection (APR) and VRAM flap closure of perineal wound on 04/21/14, surgical margins were positive for cancer    06/28/2014 - 09/25/2014 Chemotherapy 5-FU 1000mg /m2/day on D1-5, ciplatin 100mg /m2 on D2, every 28 days, s/p 4 cycles    10/11/2014 Progression CT CAP showed new liver lesions and abd/pelc adenopathy, highly suspecious for progressive metastatic disease    11/02/2014 Imaging abdomen MRI w wo contrast showed new small lesions in the right hepatic lobe with nonspecific enhancement, and enlarging nodes in the porta hepatis, metastatic disease not excluded    02/24/2015 Imaging PET scan showed hypermetabolic bone mets in right sacrum, and small hypermetabolic retroperitoneal lymph nodes, no other abnormal metabolic activity within the liver or other place.   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   04/15/2015 - 08/18/2015 Chemotherapy Nivolumab 240 mg every 2 weeks, stopped due to disease  progression    07/07/2015 Imaging PET scan showed interval progression of lytic metastasis involving the righ side of sacrum, pathologic fracture involving the right sacral wing. Improvement in retroperitoneal lymph node metastasis. 7 mm nodule in the left upper lobe lung, indeterminate.   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   10/20/2015 -  Chemotherapy Weekly carboplatin AUC 2, Taxol 80 mg/m, 3 weeks on, one-week off     OTHER RELATED ISSUES: 1. (+) HIV, on therapy with good control  2. CAD, s/p stent placement in [place]: Weekly carboplatin AUC 2, Taxol 80 mg/m, 3 weeks on, one-week off, starting 10/20/2015  INTERIM HISTORY: Papa returns for follow up. He has been having worsening dyspnea lately, Was seen by our symptom management clinic last week. His recent CT scan showed metastatic cancer in his lungs. He has been on home oxygen since last week, still has moderate dyspnea on minimal exertion, and a moderate dry cough. I gave him a course of antibiotics erythromycin last week, which he has just completed, but did not improve his symptoms. He denies any fever or chills. His appetite and energy level is low, he still able to walk from home, his mother and sister has been checking on him and helping out at home.  MEDICAL HISTORY:  Past Medical History  Diagnosis Date  . S/p bare metal coronary artery stent     1998  pLAD  . Hypertension   . History of rectal cancer     04/ 2012--  invasive squamous cell  carcinoma at 9 o'clock position, poorly differentiated s/p resection and  30 cylces radiation / chemo therapy for 2 weeks  . Hyperlipidemia   . History of carcinoma in situ of anal canal     2010  . ED (erectile  dysfunction)   . Sigmoid diverticulosis   . PONV (postoperative nausea and vomiting)   . Wears glasses   . At risk for sleep apnea     STOP-BANG= 4     SENT TO PCP 03-23-2014  . Coronary artery disease CARDIOLOGIST--  DR Rockey Situ --  LAST LOV 04-17-2013    S/P  PCI to mid and distal LAD and BM stent x1 to pLAD  . Anxiety   . Depression   . HIV positive ([place])   . Type 2 diabetes mellitus ([place])   . Radiation 01/2011    definitive dose RT 45 Gy over 5 weeks with 16Gy boosting  . 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 Primary cancer of anal canal ([place]) 05/18/2011    SURGICAL HISTORY: Past Surgical History  Procedure Laterality Date  . Septoplasty  1985  . Sphincterotomy  2009  . Cardiovascular stress test  04/ 2010    low risk study/  very small region of mildly reduced perfersion is mildly reversible dLAD territory  . Resection anal mass  02-05-2011  . Excision anal polyp  2009  . Re-excision anal squamous cell carcinoma  2010  . Removal rectal mass  01-08-2014  . Eua w/ bx perianal skin  06-08-2009  . Port-a-cath placement  02-21-2011    REMOVED  2013  . Coronary angioplasty with stent placement  1998   [facility]    BM stent to pLAD and PCI to Mid and Distal LAD/  no other significant cad  . Inguinal hernia repair Bilateral 1988  . Colonoscopy with propofol  05-28-2008  . Mass excision N/A 03/26/2014    Procedure: ANAL EXAM UNDER ANESTHESIA,EXCISIONAL BIOPSY OF ANAL MASS;  Surgeon: Leighton Ruff, MD;  Location: [place];  Service: General;  Laterality: N/A;  . Laparoscopic assisted abdominal perineal resection N/A 04/22/2014    Procedure: LAPAROSCOPIC  ABDOMINAL PERINEAL RESECTION, ;  Surgeon: Leighton Ruff, MD;  Location: [facility];  Service: General;  Laterality: N/A;  . Urethrotomy Bilateral 04/22/2014    Procedure: CYSTOSCOPY/URETHROTOMY;  Surgeon: Leighton Ruff, MD;  Location: [facility];  Service: General;  Laterality: Bilateral;   . Muscle flap closure N/A 04/22/2014    Procedure: VRAM FLAP;  Surgeon: Irene Limbo, MD;  Location: [facility];  Service: Plastics;  Laterality: N/A;  . Portacath placement Left 06/24/2014    Procedure: INSERTION PORT-A-CATH LEFT SUBCLAVIAN;  Surgeon: Leighton Ruff, MD;  Location: [facility];  Service: General;  Laterality: Left;    SOCIAL HISTORY: Social History   Social History  . Marital Status: Single    Spouse Name: N/A  . Number of Children: 0  . Years of Education: N/A   Occupational History  .  [employer] History Main Topics  . Smoking status: Never Smoker   . Smokeless tobacco: Never Used  . Alcohol Use: No     Comment: RARE  . Drug Use: No  . Sexual Activity: Not on file   Other Topics Concern  . Not on file   Social History Narrative   Full time. Single. Does not regularly exercise.    Lives alone, his mother lives close by  FAMILY HISTORY: Family History  Problem Relation Age of Onset  . Diabetes type II Mother   . Breast cancer Mother 41     survivor  . Hypertension Mother   . Coronary artery disease Father   Paternal GM had utrine cancer, no other history of malignancy   ALLERGIES:  is allergic to sulfa antibiotics.  MEDICATIONS:  Current Outpatient Prescriptions  Medication Sig Dispense Refill  . aspirin (ASPIR-81) 81 MG EC tablet Take 81 mg by mouth at bedtime.     Marland Kitchen atorvastatin (LIPITOR) 80 MG tablet Take 1 tablet (80 mg total) by mouth every evening. 90 tablet 4  . azithromycin (ZITHROMAX) 500 MG tablet Take 1 tablet (500 mg total) by mouth daily. 7 tablet 0  . efavirenz-emtrictabine-tenofovir (ATRIPLA) [id number] MG per tablet Take 1 tablet by mouth at bedtime.     . Empagliflozin-Linagliptin (GLYXAMBI) 25-5 MG TABS Take 1 tablet by mouth daily.    Marland Kitchen gabapentin (NEURONTIN) 300 MG capsule Take 1 capsule (300 mg total) by mouth 3 (three) times daily. 30 capsule 2  . glimepiride (AMARYL) 2 MG tablet Take 4 mg by mouth daily  with breakfast.     . HYDROcodone-acetaminophen (NORCO/VICODIN) 5-325 MG tablet Take 1 tablet by mouth every 8 (eight) hours as needed for moderate pain. 60 tablet 0  . HYDROcodone-homatropine (HYCODAN) 5-1.5 MG/5ML syrup Take 5 mLs by mouth every 6 (six) hours as needed for cough. 120 mL 0  . lidocaine-prilocaine (EMLA) cream Apply topically as needed. Reported on 09/22/2015    . losartan (COZAAR) 100 MG tablet Take 1 tablet (100 mg total) by mouth daily. 90 tablet 4  . metFORMIN (GLUCOPHAGE) 1000 MG tablet Take 1,000 mg by mouth 2 (two) times daily.      Marland Kitchen oxyCODONE-acetaminophen (PERCOCET/ROXICET) 5-325 MG tablet Take 1 tablet by mouth every 8 (eight) hours as needed for moderate pain or severe pain. 30 tablet 0   No current facility-administered medications for this visit.   Facility-Administered Medications Ordered in Other Visits  Medication Dose Route Frequency Provider Last Rate Last Dose  . heparin lock flush 100 unit/mL  500 Units Intravenous Once Truitt Merle, MD      . sodium chloride 0.9 % injection 10 mL  10 mL Intravenous PRN Truitt Merle, MD      . sodium chloride flush (NS) 0.9 % injection 10 mL  10 mL Intracatheter PRN Truitt Merle, MD   10 mL at 10/20/15 1321    REVIEW OF SYSTEMS:   Constitutional: Denies fevers, chills or abnormal night sweats Eyes: Denies blurriness of vision, double vision or watery eyes Ears, nose, mouth, throat, and face: Denies mucositis or sore throat Respiratory: Denies cough, dyspnea or wheezes Cardiovascular: Denies palpitation, chest discomfort or lower extremity swelling Gastrointestinal:  Denies nausea, heartburn or change in bowel habits, (+) colostomy bag Skin: Mild skin pigmentation from his previous rash. Lymphatics: Denies new lymphadenopathy or easy bruising Neurological:Denies numbness, tingling or new weaknesses Behavioral/Psych: Mood is stable, no new changes  All other systems were reviewed with the patient and are negative.  PHYSICAL  EXAMINATION: ECOG PERFORMANCE STATUS: 3 Blood pressure 122/76, heart rate 91, temperature 97.4, oxygen 93% on 3 L GENERAL:alert, no distress, sits in a wheelchair comfortably SKIN: skin color, texture, turgor are normal, healed rashes on her arms, legs, and trunk with skin pigmentation.  EYES: normal, conjunctiva are pink and non-injected, sclera clear OROPHARYNX:no exudate, no erythema and lips, buccal mucosa, and tongue normal  NECK: supple, thyroid normal  size, non-tender, without nodularity LYMPH:  no palpable lymphadenopathy in the cervical, axillary or inguinal LUNGS: clear to auscultation and percussion with normal breathing effort HEART: regular rate & rhythm and no murmurs and no lower extremity edema ABDOMEN:abdomen soft, non-tender and normal bowel sounds. (+) Colostomy bag. The perianal reconstruction site is completely healed.  Musculoskeletal:no cyanosis of digits and no clubbing  PSYCH: alert & oriented x 3 with fluent speech NEURO: no focal motor/sensory deficits SKIN: A few skin pigmentation from his previous rash  LABORATORY DATA:  I have reviewed the data as listed CBC Latest Ref Rng 10/20/2015 10/14/2015 10/12/2015  WBC 4.0 - 10.3 10e3/uL 8.0 5.9 5.5  Hemoglobin 13.0 - 17.1 g/dL 13.8 13.4 13.9  Hematocrit 38.4 - 49.9 % 41.3 39.4 42.4  Platelets 140 - 400 10e3/uL 281 261 291     Recent Labs  08/16/15 0707  10/12/15 1151 10/14/15 1208 10/20/15 0856  NA  --   < > 134* 133* 134*  K  --   < > 5.1 4.6 4.3  CO2  --   < > 21* 20* 21*  GLUCOSE  --   < > 242* 264* 238*  BUN  --   < > 23.9 20.1 23.3  CREATININE  --   < > 1.3 1.4* 1.4*  CALCIUM  --   < > 10.0 9.5 9.6  PROT 7.6  < > 8.4* 8.0 8.1  ALBUMIN 4.3  < > 3.2* 3.1* 3.1*  AST 36  < > 33 30 29  ALT 32  < > 36 30 31  ALKPHOS 149*  < > 233* 235* 215*  BILITOT 0.3  < > 0.36 0.40 0.39  BILIDIR 0.10  --   --   --   --   < > = values in this interval not displayed.   PATHOLOGY REPORT 04/22/2014 Diagnosis 1. Soft  tissue, biopsy, right pelvic sidewall; r/o malignancy - FIBROADIPOSE TISSUE, NO EVIDENCE OF MALIGNANCY. 2. Colon, segmental resection for tumor, rectum,sigmoid and anus - RECURRENT INVASIVE POORLY DIFFERENTIATED SQUAMOUS CELL CARCINOMA, INVADING THROUGH THE MUSCULARIS PROPRIA, INTO PERICOLONIC FATTY TISSUE, EXTENDING TO THE DEEP INKED MARGIN. - PERINEURAL INVASION AND ANGIOLYMPHATIC PRESENT. - SEVEN OF SEVENTEEN LYMPH NODES, POSITIVE FOR METASTATIC CARCINOMA (7/17) Microscopic Comment 2. Anus Specimen: Anus, rectum, sigmoid Procedure: Segmental resection Tumor site: Distal ends of specimen Specimen integrity: Intact Invasive tumor: Maximum size: At least 2.5 cm Histologic type(s): Invasive squamous cell carcinoma Histologic grade and differentiation: G2-3, moderately to poorly differentiated Microscopic extension of invasive tumor: Invading through the muscularis propria into pericolonic fatty tissue involving adjacent skeletal muscle tissue Lymph-Vascular invasion: Present Peri-neural invasion: Present Resection margins: Deep margin is positive for tumor Treatment effect (neo-adjuvant therapy): Present, minimally Lymph nodes: number examined 17; number positive: 7 Pathologic Staging: ypT2, yN1 (R1) Ancillary studies: N/A  Diagnosis 03/15/2015 Bone, biopsy, caudal left side of the sacrum - METASTATIC SQUAMOUS CELL CARCINOMA, SEE COMMENT. Microscopic Comment Biopsies demonstrate extensive involvement by metastatic focally keratinizing, well to moderately differentiated squamous cell carcinoma. The previous anorectal resection demonstrating invasive squamous cell carcinoma is noted [id number]). (CRR:ecj 03/16/2015)  RADIOGRAPHIC STUDIES: I have personally reviewed the radiological images as listed and agreed with the findings in the report.  CT chest PE protocol 10/12/2015 IMPRESSION: 1. No evidence of acute pulmonary embolism. 2. 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 An atypical infection is also a consideration. 3. Calcified gallstones within the  gallbladder. 4. Coronary artery calcifications.   ASSESSMENT & PLAN:  55 years old male with past medical history of anal squamous cell carcinoma, status post surgical resection and concurrent chemoradiation in 2009, now has local recurrence status post APR and  reconstruction.ypT2, yN1 (R1), unfortunately his deep surgical margin was positive, and 7 out of 17 lymph nodes were positive also. No distant metastasis on the preop PET scan. His medical history is also complicated by diabetes, coronary artery disease status post stent placement, and HIV on treatment.   1. Local recurrence of anal squamous cell carcinoma, with positive surgical margines, biopsy proven sacral bone mets on 03/15/2015 -His CT chest was reviewed with patient last week, which showed bilateral lung metastasis -He knows this is terminal and incurable, his prognosis is very poor, giving he has had multiple lines of therapy. -He would like to try more chemotherapy, although we know the response placed is likely going to be low. -I recommend weekly carbo and Taxol, given his limited performance status. The potential benefit and side effects were discussed with him in details, chemotherapy consent was obtained. -Lab reviewed, we'll start first cycle today.  2. Cough, dyspnea and hypoxia -Secondary to his underlying lung metastasis. -He has tried a course of azithromycin, which did not help -He will continue using oxygen, and cough medicine as needed.  3. Sacral pain -secondary to his sacral bone metastasis, status post palliative radiation -He will continue oxycodone or hydrocodone as needed for pain. -He tried tramadol before, it did not work well -I suggest him to use Tylenol as needed.   4. Diabetes -controlled recently  -continue  metformin and glimepiride, follow up with his endocrinologist   5. HIV  -Well controlled. Continue his treatment . His recent CD4 count was normal.   6. Hypertension and coronary artery disease  -He will continue follow-up with his primary care physician and cardiologist. Continue medications.   7. depression -stable   Plan -lab reviewed, will start first cycle carboplatin and Taxol today.  -I'll see him back in one week before C1D8 treatment   All questions were answered. The patient knows to call the clinic with any problems, questions or concerns.   I spent 20 minutes counseling the patient face to face. The total time spent in the appointment was 25 minutes and more than 50% was on counseling.     Truitt Merle, MD 10/20/2015

## 2015-10-20 NOTE — Progress Notes (Signed)
Pt tolerated 1st taxol/carbo well.  Informed Thu, RN at Dr. Ernestina Penna desk that pt is in need of home med for nausea.  Instructed pt to inform us if he doesn't hear about RX.  Pt verbalized understanding.

## 2015-10-21 ENCOUNTER — Telehealth: Payer: Self-pay | Admitting: *Deleted

## 2015-10-21 ENCOUNTER — Telehealth: Payer: Self-pay | Admitting: Medical Oncology

## 2015-10-21 DIAGNOSIS — C211 Malignant neoplasm of anal canal: Secondary | ICD-10-CM

## 2015-10-21 MED ORDER — ONDANSETRON HCL 8 MG PO TABS: 8.0000 mg | ORAL_TABLET | Freq: Two times a day (BID) | ORAL | Status: DC | PRN

## 2015-10-21 NOTE — Telephone Encounter (Signed)
Spoke with Colletta Maryland at [facility] re: oxygen.  Will need to fax orders.  Discussed with Dr. Burr Medico.  She is fine with increasing O2 to 4L/min with a max rate of 5-6.   Faxed orders to [phone] per Colletta Maryland.  "Patient may increase O2 to 4L/min (with a maximum of 5-6L/min).  Please provide equipment necessary to accomodate this."   Called patient and let him know that order will be faxed this afternoon.

## 2015-10-21 NOTE — Telephone Encounter (Signed)
Chemo F/U-"I am feeling a Im queasy today".  I asked if he has taken anything for nausea and he said no  "I can"t take any zofran  for 3 days" . I asked him if he had Compazine and after looking through his pill bottles he said he did have compazine but not Zofran. I  told him to take compazine now and to pick up zofran rx today ( I called it in) and he can start it on Sunday . He stated he is drinking water and high protein drink. I will call back in a few hours to see how he is doing.

## 2015-10-21 NOTE — Telephone Encounter (Addendum)
"  Was seen last week.  New order for 2L oxygen compressor received.  I need Ms. Julian Palmer to send an order to [facility] for a new machine.  I turned my oxygen up to 4L and it stopped.  [facility] will not send one without a new order."  Return number [phone].     With th is call asked what changes Julian Palmer's having in respiratory status.  Julian Palmer is coughing continually.  Denies any change in cough today than last Friday.  Uses Hycodan syrup at night.

## 2015-10-21 NOTE — Telephone Encounter (Signed)
His nausea is a [place] better . He is waiting to hear back about his oxygen liters.

## 2015-10-25 ENCOUNTER — Telehealth: Payer: Self-pay | Admitting: *Deleted

## 2015-10-25 NOTE — Telephone Encounter (Signed)
Spoke with Dawn, RN @ [facility] stating that pt has prolonged shortness of breath.  Per Arrie Aran, pt stated this is not a new problem.  Pt has O2 at 2 L  [place] with low sat;  Oxygen was increased to 4 L [place] with sat of 95.  Per Dawn, pt's lung sounds completely clear,  A&O x 3, just more fatigue with any activities.  Asked Dawn if pt needs to be seen sooner by symptom management provider.  Dawn stated " No.  He can wait until his appt with Dr. Burr Medico on 10/27/15.  Just want to give Dr. Burr Medico update on pt's condition ".   Dawn stated she would order Viola to assist pt today. Dawn's   Phone      [phone].

## 2015-10-27 ENCOUNTER — Ambulatory Visit (HOSPITAL_BASED_OUTPATIENT_CLINIC_OR_DEPARTMENT_OTHER): Payer: Managed Care, Other (non HMO) | Admitting: Hematology

## 2015-10-27 ENCOUNTER — Ambulatory Visit (HOSPITAL_BASED_OUTPATIENT_CLINIC_OR_DEPARTMENT_OTHER): Payer: Managed Care, Other (non HMO)

## 2015-10-27 ENCOUNTER — Other Ambulatory Visit (HOSPITAL_BASED_OUTPATIENT_CLINIC_OR_DEPARTMENT_OTHER): Payer: Managed Care, Other (non HMO)

## 2015-10-27 ENCOUNTER — Ambulatory Visit: Payer: Self-pay | Admitting: Radiation Oncology

## 2015-10-27 ENCOUNTER — Encounter: Payer: Self-pay | Admitting: Hematology

## 2015-10-27 ENCOUNTER — Ambulatory Visit: Payer: Managed Care, Other (non HMO)

## 2015-10-27 VITALS — BP 99/68 | HR 105 | Resp 17 | Ht 72.0 in | Wt 184.5 lb

## 2015-10-27 DIAGNOSIS — IMO0001 Reserved for inherently not codable concepts without codable children: Secondary | ICD-10-CM

## 2015-10-27 DIAGNOSIS — E1165 Type 2 diabetes mellitus with hyperglycemia: Secondary | ICD-10-CM | POA: Diagnosis not present

## 2015-10-27 DIAGNOSIS — I10 Essential (primary) hypertension: Secondary | ICD-10-CM | POA: Diagnosis not present

## 2015-10-27 DIAGNOSIS — Z5111 Encounter for antineoplastic chemotherapy: Secondary | ICD-10-CM | POA: Diagnosis not present

## 2015-10-27 DIAGNOSIS — Z794 Long term (current) use of insulin: Secondary | ICD-10-CM

## 2015-10-27 DIAGNOSIS — C211 Malignant neoplasm of anal canal: Secondary | ICD-10-CM

## 2015-10-27 DIAGNOSIS — B20 Human immunodeficiency virus [HIV] disease: Secondary | ICD-10-CM

## 2015-10-27 DIAGNOSIS — I2510 Atherosclerotic heart disease of native coronary artery without angina pectoris: Secondary | ICD-10-CM | POA: Diagnosis not present

## 2015-10-27 DIAGNOSIS — F329 Major depressive disorder, single episode, unspecified: Secondary | ICD-10-CM

## 2015-10-27 DIAGNOSIS — F32A Depression, unspecified: Secondary | ICD-10-CM

## 2015-10-27 LAB — CBC WITH DIFFERENTIAL/PLATELET
BASO%: 0.5 % (ref 0.0–2.0)
Basophils Absolute: 0 10*3/uL (ref 0.0–0.1)
EOS%: 3.1 % (ref 0.0–7.0)
Eosinophils Absolute: 0.2 10*3/uL (ref 0.0–0.5)
HEMATOCRIT: 36.9 % — AB (ref 38.4–49.9)
HGB: 12.6 g/dL — ABNORMAL LOW (ref 13.0–17.1)
LYMPH%: 5.9 % — AB (ref 14.0–49.0)
MCH: 29.6 pg (ref 27.2–33.4)
MCHC: 34.1 g/dL (ref 32.0–36.0)
MCV: 86.6 fL (ref 79.3–98.0)
MONO#: 0.5 10*3/uL (ref 0.1–0.9)
MONO%: 8.7 % (ref 0.0–14.0)
NEUT#: 4.7 10*3/uL (ref 1.5–6.5)
NEUT%: 81.8 % — AB (ref 39.0–75.0)
PLATELETS: 262 10*3/uL (ref 140–400)
RBC: 4.26 10*6/uL (ref 4.20–5.82)
RDW: 15.5 % — ABNORMAL HIGH (ref 11.0–14.6)
WBC: 5.8 10*3/uL (ref 4.0–10.3)
lymph#: 0.3 10*3/uL — ABNORMAL LOW (ref 0.9–3.3)
nRBC: 0 % (ref 0–0)

## 2015-10-27 LAB — COMPREHENSIVE METABOLIC PANEL
ALBUMIN: 2.8 g/dL — AB (ref 3.5–5.0)
ALT: 35 U/L (ref 0–55)
ANION GAP: 11 meq/L (ref 3–11)
AST: 36 U/L — ABNORMAL HIGH (ref 5–34)
Alkaline Phosphatase: 193 U/L — ABNORMAL HIGH (ref 40–150)
BILIRUBIN TOTAL: 0.45 mg/dL (ref 0.20–1.20)
BUN: 25.9 mg/dL (ref 7.0–26.0)
CALCIUM: 9.1 mg/dL (ref 8.4–10.4)
CO2: 23 mEq/L (ref 22–29)
CREATININE: 1.3 mg/dL (ref 0.7–1.3)
Chloride: 98 mEq/L (ref 98–109)
EGFR: 60 mL/min/{1.73_m2} — ABNORMAL LOW (ref >90)
Glucose: 243 mg/dl — ABNORMAL HIGH (ref 70–140)
Potassium: 4.2 mEq/L (ref 3.5–5.1)
Sodium: 132 mEq/L — ABNORMAL LOW (ref 136–145)
Total Protein: 7.4 g/dL (ref 6.4–8.3)

## 2015-10-27 MED ORDER — PALONOSETRON HCL INJECTION 0.25 MG/5ML
0.2500 mg | Freq: Once | INTRAVENOUS | Status: AC
  Administered 2015-10-27: 0.25 mg via INTRAVENOUS

## 2015-10-27 MED ORDER — HEPARIN SOD (PORK) LOCK FLUSH 100 UNIT/ML IV SOLN
500.0000 [IU] | Freq: Once | INTRAVENOUS | Status: AC | PRN
  Administered 2015-10-27: 500 [IU]
  Filled 2015-10-27: qty 5

## 2015-10-27 MED ORDER — FAMOTIDINE IN NACL 20-0.9 MG/50ML-% IV SOLN
20.0000 mg | Freq: Once | INTRAVENOUS | Status: AC
  Administered 2015-10-27: 20 mg via INTRAVENOUS

## 2015-10-27 MED ORDER — SODIUM CHLORIDE 0.9 % IV SOLN
Freq: Once | INTRAVENOUS | Status: AC
  Administered 2015-10-27: 10:00:00 via INTRAVENOUS

## 2015-10-27 MED ORDER — FAMOTIDINE IN NACL 20-0.9 MG/50ML-% IV SOLN
INTRAVENOUS | Status: AC
  Filled 2015-10-27: qty 50

## 2015-10-27 MED ORDER — SODIUM CHLORIDE 0.9 % IV SOLN
80.0000 mg/m2 | Freq: Once | INTRAVENOUS | Status: AC
  Administered 2015-10-27: 168 mg via INTRAVENOUS
  Filled 2015-10-27: qty 28

## 2015-10-27 MED ORDER — SODIUM CHLORIDE 0.9 % IV SOLN
206.4000 mg | Freq: Once | INTRAVENOUS | Status: AC
  Administered 2015-10-27: 210 mg via INTRAVENOUS
  Filled 2015-10-27: qty 21

## 2015-10-27 MED ORDER — DIPHENHYDRAMINE HCL 50 MG/ML IJ SOLN
50.0000 mg | Freq: Once | INTRAMUSCULAR | Status: AC
  Administered 2015-10-27: 50 mg via INTRAVENOUS

## 2015-10-27 MED ORDER — SODIUM CHLORIDE 0.9 % IV SOLN
20.0000 mg | Freq: Once | INTRAVENOUS | Status: AC
  Administered 2015-10-27: 20 mg via INTRAVENOUS
  Filled 2015-10-27: qty 2

## 2015-10-27 MED ORDER — DIPHENHYDRAMINE HCL 50 MG/ML IJ SOLN
INTRAMUSCULAR | Status: AC
  Filled 2015-10-27: qty 1

## 2015-10-27 MED ORDER — SODIUM CHLORIDE 0.9% FLUSH
10.0000 mL | INTRAVENOUS | Status: DC | PRN
  Administered 2015-10-27: 10 mL
  Filled 2015-10-27: qty 10

## 2015-10-27 MED ORDER — PALONOSETRON HCL INJECTION 0.25 MG/5ML
INTRAVENOUS | Status: AC
  Filled 2015-10-27: qty 5

## 2015-10-27 NOTE — Progress Notes (Signed)
[place] NOTE  Patient Care Team: Leonel Ramsay, MD as PCP - General (Infectious Diseases) Minna Merritts, MD as Consulting Physician (Cardiology) Robert Bellow, MD (General Surgery) Leighton Ruff, MD as Consulting Physician (General Surgery) Irene Limbo, MD as Consulting Physician (Plastic Surgery) Truitt Merle, MD as Consulting Physician (Hematology) Festus Aloe, MD as Consulting Physician (Urology) Gery Pray, MD as Consulting Physician (Radiation Oncology)  DIAGNOSIS Follow-up recurrent anal cancer    Primary cancer of anal canal ([place])   02/05/2011 Initial Diagnosis Primary cancer of anal canal, T1N0M0, s/p surgical resection with positive margines. He received definitive dose RT 45 Gy over 5 weeks with 16Gy boosting and concurrent chemo with 5FU, treated at [facility] by Drs.  Geoffreyh and [employer].    11/23/2013 Relapse/Recurrence local recurrence    04/21/2014 Surgery abdominal perianal resection (APR) and VRAM flap closure of perineal wound on 04/21/14, surgical margins were positive for cancer    06/28/2014 - 09/25/2014 Chemotherapy 5-FU 1000mg /m2/day on D1-5, ciplatin 100mg /m2 on D2, every 28 days, s/p 4 cycles    10/11/2014 Progression CT CAP showed new liver lesions and abd/pelc adenopathy, highly suspecious for progressive metastatic disease    11/02/2014 Imaging abdomen MRI w wo contrast showed new small lesions in the right hepatic lobe with nonspecific enhancement, and enlarging nodes in the porta hepatis, metastatic disease not excluded    02/24/2015 Imaging PET scan showed hypermetabolic bone mets in right sacrum, and small hypermetabolic retroperitoneal lymph nodes, no other abnormal metabolic activity within the liver or other place.   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   04/15/2015 - 08/18/2015 Chemotherapy Nivolumab 240 mg every 2 weeks, stopped due to disease  progression    07/07/2015 Imaging PET scan showed interval progression of lytic metastasis involving the righ side of sacrum, pathologic fracture involving the right sacral wing. Improvement in retroperitoneal lymph node metastasis. 7 mm nodule in the left upper lobe lung, indeterminate.   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   10/20/2015 -  Chemotherapy Weekly carboplatin AUC 2, Taxol 80 mg/m, 3 weeks on, one-week off     OTHER RELATED ISSUES: 1. (+) HIV, on therapy with good control  2. CAD, s/p stent placement in [place]: Weekly carboplatin AUC 2, Taxol 80 mg/m, 3 weeks on, one-week off, starting 10/20/2015  INTERIM HISTORY: Julian Palmer returns for follow up and C1D8 chemo. He overall tolerated chemotherapy last week well, had mild nausea for a few days, no vomiting, he has been taking Compazine and Zofran. He had issue with constipation, resolved after MiraLAX. He has required more oxygen this week, he is on 4 L/min, and he has dyspnea with minimal exertion. It's been getting harder for him to go upstairs, he lives alone, and his sister and mother. By to see him every day. He has home care service also.  MEDICAL HISTORY:  Past Medical History  Diagnosis Date  . S/p bare metal coronary artery stent     1998  pLAD  . Hypertension   . 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  Hyperlipidemia   . History of carcinoma in situ of anal canal     2010  . ED (erectile dysfunction)   . Sigmoid diverticulosis   . PONV (postoperative nausea and vomiting)   . Wears glasses   . At risk  for sleep apnea     STOP-BANG= 4     SENT TO PCP 03-23-2014  . Coronary artery disease CARDIOLOGIST--  DR Rockey Situ --  LAST LOV 04-17-2013    S/P  PCI to mid and distal LAD and BM stent x1 to pLAD  . Anxiety   . Depression   . HIV positive ([place])   . Type 2 diabetes mellitus ([place])   . Radiation 01/2011    definitive dose RT 45 Gy over 5 weeks with 16Gy boosting  . 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 Primary cancer of anal canal ([place]) 05/18/2011    SURGICAL HISTORY: Past Surgical History  Procedure Laterality Date  . Septoplasty  1985  . Sphincterotomy  2009  . Cardiovascular stress test  04/ 2010    low risk study/  very small region of mildly reduced perfersion is mildly reversible dLAD territory  . Resection anal mass  02-05-2011  . Excision anal polyp  2009  . Re-excision anal squamous cell carcinoma  2010  . Removal rectal mass  01-08-2014  . Eua w/ bx perianal skin  06-08-2009  . Port-a-cath placement  02-21-2011    REMOVED  2013  . Coronary angioplasty with stent placement  1998   [facility]    BM stent to pLAD and PCI to Mid and Distal LAD/  no other significant cad  . Inguinal hernia repair Bilateral 1988  . Colonoscopy with propofol  05-28-2008  . Mass excision N/A 03/26/2014    Procedure: ANAL EXAM UNDER ANESTHESIA,EXCISIONAL BIOPSY OF ANAL MASS;  Surgeon: Leighton Ruff, MD;  Location: [place];  Service: General;  Laterality: N/A;  . Laparoscopic assisted abdominal perineal resection N/A 04/22/2014    Procedure: LAPAROSCOPIC  ABDOMINAL PERINEAL RESECTION, ;  Surgeon: Leighton Ruff, MD;  Location: [facility];  Service: General;  Laterality: N/A;  . Urethrotomy Bilateral 04/22/2014    Procedure: CYSTOSCOPY/URETHROTOMY;  Surgeon: Leighton Ruff, MD;  Location: [facility];  Service: General;  Laterality: Bilateral;  . Muscle flap closure N/A 04/22/2014    Procedure: VRAM FLAP;  Surgeon: Irene Limbo, MD;  Location: [facility];   Service: Plastics;  Laterality: N/A;  . Portacath placement Left 06/24/2014    Procedure: INSERTION PORT-A-CATH LEFT SUBCLAVIAN;  Surgeon: Leighton Ruff, MD;  Location: [facility];  Service: General;  Laterality: Left;    SOCIAL HISTORY: Social History   Social History  . Marital Status: Single    Spouse Name: N/A  . Number of Children: 0  . Years of Education: N/A   Occupational History  .  [place] History Main Topics  . Smoking status: Never Smoker   . Smokeless tobacco: Never Used  . Alcohol Use: No     Comment: RARE  . Drug Use: No  . Sexual Activity: Not on file   Other Topics Concern  . Not on file   Social History Narrative   Full time. Single. Does not regularly exercise.    Lives alone, his mother lives close by    FAMILY HISTORY: Family History  Problem Relation Age of Onset  . Diabetes type II Mother   . Breast cancer Mother  62     survivor  . Hypertension Mother   . Coronary artery disease Father   Paternal GM had utrine cancer, no other history of malignancy   ALLERGIES:  is allergic to sulfa antibiotics.  MEDICATIONS:  Current Outpatient Prescriptions  Medication Sig Dispense Refill  . aspirin (ASPIR-81) 81 MG EC tablet Take 81 mg by mouth at bedtime.     Marland Kitchen atorvastatin (LIPITOR) 80 MG tablet Take 1 tablet (80 mg total) by mouth every evening. 90 tablet 4  . azithromycin (ZITHROMAX) 500 MG tablet Take 1 tablet (500 mg total) by mouth daily. 7 tablet 0  . efavirenz-emtrictabine-tenofovir (ATRIPLA) [id number] MG per tablet Take 1 tablet by mouth at bedtime.     . Empagliflozin-Linagliptin (GLYXAMBI) 25-5 MG TABS Take 1 tablet by mouth daily.    Marland Kitchen gabapentin (NEURONTIN) 300 MG capsule Take 1 capsule (300 mg total) by mouth 3 (three) times daily. 30 capsule 2  . glimepiride (AMARYL) 2 MG tablet Take 4 mg by mouth daily with breakfast.     . HYDROcodone-acetaminophen (NORCO/VICODIN) 5-325 MG tablet Take 1 tablet by mouth every 8  (eight) hours as needed for moderate pain. 60 tablet 0  . HYDROcodone-homatropine (HYCODAN) 5-1.5 MG/5ML syrup Take 5 mLs by mouth every 6 (six) hours as needed for cough. 120 mL 0  . lidocaine-prilocaine (EMLA) cream Apply topically as needed. Reported on 09/22/2015    . losartan (COZAAR) 100 MG tablet Take 1 tablet (100 mg total) by mouth daily. 90 tablet 4  . metFORMIN (GLUCOPHAGE) 1000 MG tablet Take 1,000 mg by mouth 2 (two) times daily.      . ondansetron (ZOFRAN) 8 MG tablet Take 1 tablet (8 mg total) by mouth 2 (two) times daily as needed for nausea or vomiting. Take 8 mg by mouth 2 times daily for 3 days and as needed for nausea -  Start on third  day after chemo . 30 tablet 1  . oxyCODONE-acetaminophen (PERCOCET/ROXICET) 5-325 MG tablet Take 1 tablet by mouth every 8 (eight) hours as needed for moderate pain or severe pain. 30 tablet 0  . prochlorperazine (COMPAZINE) 10 MG tablet Take 1 tablet (10 mg total) by mouth every 6 (six) hours as needed for nausea or vomiting. 30 tablet 1   No current facility-administered medications for this visit.   Facility-Administered Medications Ordered in Other Visits  Medication Dose Route Frequency Provider Last Rate Last Dose  . heparin lock flush 100 unit/mL  500 Units Intravenous Once Truitt Merle, MD      . sodium chloride 0.9 % injection 10 mL  10 mL Intravenous PRN Truitt Merle, MD        REVIEW OF SYSTEMS:   Constitutional: Denies fevers, chills or abnormal night sweats Eyes: Denies blurriness of vision, double vision or watery eyes Ears, nose, mouth, throat, and face: Denies mucositis or sore throat Respiratory: Denies cough, dyspnea or wheezes Cardiovascular: Denies palpitation, chest discomfort or lower extremity swelling Gastrointestinal:  Denies nausea, heartburn or change in bowel habits, (+) colostomy bag Skin: Mild skin pigmentation from his previous rash. Lymphatics: Denies new lymphadenopathy or easy bruising Neurological:Denies  numbness, tingling or new weaknesses Behavioral/Psych: Mood is stable, no new changes  All other systems were reviewed with the patient and are negative.  PHYSICAL EXAMINATION: ECOG PERFORMANCE STATUS: 3 BP 99/68 mmHg  Pulse 105  Resp 17  Ht 6' (1.829 m)  Wt 184 lb 8 oz (83.689 kg)  BMI 25.02 kg/m2  SpO2 93%  GENERAL:alert,  no distress, sits in a wheelchair comfortably SKIN: skin color, texture, turgor are normal, healed rashes on her arms, legs, and trunk with skin pigmentation.  EYES: normal, conjunctiva are pink and non-injected, sclera clear OROPHARYNX:no exudate, no erythema and lips, buccal mucosa, and tongue normal  NECK: supple, thyroid normal size, non-tender, without nodularity LYMPH:  no palpable lymphadenopathy in the cervical, axillary or inguinal LUNGS: clear to auscultation and percussion with normal breathing effort HEART: regular rate & rhythm and no murmurs and no lower extremity edema ABDOMEN:abdomen soft, non-tender and normal bowel sounds. (+) Colostomy bag. The perianal reconstruction site is completely healed.  Musculoskeletal:no cyanosis of digits and no clubbing  PSYCH: alert & oriented x 3 with fluent speech NEURO: no focal motor/sensory deficits SKIN: A few skin pigmentation from his previous rash  LABORATORY DATA:  I have reviewed the data as listed CBC Latest Ref Rng 10/20/2015 10/14/2015 10/12/2015  WBC 4.0 - 10.3 10e3/uL 8.0 5.9 5.5  Hemoglobin 13.0 - 17.1 g/dL 13.8 13.4 13.9  Hematocrit 38.4 - 49.9 % 41.3 39.4 42.4  Platelets 140 - 400 10e3/uL 281 261 291    CMP Latest Ref Rng 10/27/2015 10/20/2015 10/14/2015  Glucose 70 - 140 mg/dl 243(H) 238(H) 264(H)  BUN 7.0 - 26.0 mg/dL 25.9 23.3 20.1  Creatinine 0.7 - 1.3 mg/dL 1.3 1.4(H) 1.4(H)  Sodium 136 - 145 mEq/L 132(L) 134(L) 133(L)  Potassium 3.5 - 5.1 mEq/L 4.2 4.3 4.6  CO2 22 - 29 mEq/L 23 21(L) 20(L)  Calcium 8.4 - 10.4 mg/dL 9.1 9.6 9.5  Total Protein 6.4 - 8.3 g/dL 7.4 8.1 8.0  Total Bilirubin  0.20 - 1.20 mg/dL 0.45 0.39 0.40  Alkaline Phos 40 - 150 U/L 193(H) 215(H) 235(H)  AST 5 - 34 U/L 36(H) 29 30  ALT 0 - 55 U/L 35 31 30     PATHOLOGY REPORT 04/22/2014 Diagnosis 1. Soft tissue, biopsy, right pelvic sidewall; r/o malignancy - FIBROADIPOSE TISSUE, NO EVIDENCE OF MALIGNANCY. 2. Colon, segmental resection for tumor, rectum,sigmoid and anus - RECURRENT INVASIVE POORLY DIFFERENTIATED SQUAMOUS CELL CARCINOMA, INVADING THROUGH THE MUSCULARIS PROPRIA, INTO PERICOLONIC FATTY TISSUE, EXTENDING TO THE DEEP INKED MARGIN. - PERINEURAL INVASION AND ANGIOLYMPHATIC PRESENT. - SEVEN OF SEVENTEEN LYMPH NODES, POSITIVE FOR METASTATIC CARCINOMA (7/17) Microscopic Comment 2. Anus Specimen: Anus, rectum, sigmoid Procedure: Segmental resection Tumor site: Distal ends of specimen Specimen integrity: Intact Invasive tumor: Maximum size: At least 2.5 cm Histologic type(s): Invasive squamous cell carcinoma Histologic grade and differentiation: G2-3, moderately to poorly differentiated Microscopic extension of invasive tumor: Invading through the muscularis propria into pericolonic fatty tissue involving adjacent skeletal muscle tissue Lymph-Vascular invasion: Present Peri-neural invasion: Present Resection margins: Deep margin is positive for tumor Treatment effect (neo-adjuvant therapy): Present, minimally Lymph nodes: number examined 17; number positive: 7 Pathologic Staging: ypT2, yN1 (R1) Ancillary studies: N/A  Diagnosis 03/15/2015 Bone, biopsy, caudal left side of the sacrum - METASTATIC SQUAMOUS CELL CARCINOMA, SEE COMMENT. Microscopic Comment Biopsies demonstrate extensive involvement by metastatic focally keratinizing, well to moderately differentiated squamous cell carcinoma. The previous anorectal resection demonstrating invasive squamous cell carcinoma is noted [id number]). (CRR:ecj 03/16/2015)  RADIOGRAPHIC STUDIES: I have personally reviewed the radiological images as  listed and agreed with the findings in the report.  CT chest PE protocol 10/12/2015 IMPRESSION: 1. No evidence of acute pulmonary embolism. 2. 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 An atypical infection is also a consideration. 3. Calcified gallstones  within the gallbladder. 4. Coronary artery calcifications.   ASSESSMENT & PLAN:  55 years old male with past medical history of anal squamous cell carcinoma, status post surgical resection and concurrent chemoradiation in 2009, now has local recurrence status post APR and  reconstruction.ypT2, yN1 (R1), unfortunately his deep surgical margin was positive, and 7 out of 17 lymph nodes were positive also. No distant metastasis on the preop PET scan. His medical history is also complicated by diabetes, coronary artery disease status post stent placement, and HIV on treatment.   1. Local recurrence of anal squamous cell carcinoma, with positive surgical margines, biopsy proven sacral bone mets on 03/15/2015 -His CT chest was reviewed with patient last week, which showed bilateral lung metastasis -He knows this is terminal and incurable, his prognosis is extremely poor, giving he has had multiple lines of therapy. -He would like to try more chemotherapy, although we know the response rate likely going to be low. -He started weekly carbo and Taxol, tolerated well last week -We had a frank and lengthy discussion about his overall poor prognosis, I recommended him to consider hospice service, I'm concerned about his safety at home since he lives alone, and his bedroom and bathroom are on the second floor. He is thinking about moving to his sister's house, where he can live on the first floor with a bathroom. -He would like to try chemotherapy for A few weeks, to see if he responds. If no response, he would likely accept hospice. -Lab results  reviewed with patient, adequate for treatment, we'll proceed chemotherapy today  2. Cough, dyspnea and hypoxia -Secondary to his underlying lung metastasis. -He has tried a course of azithromycin, which did not help -He will continue using oxygen, and cough medicine as needed.  3. Sacral pain -secondary to his sacral bone metastasis, status post palliative radiation -He will continue oxycodone or hydrocodone as needed for pain. -He tried tramadol before, it did not work well -I suggest him to use Tylenol as needed.   4. Diabetes -controlled recently  -continue metformin and glimepiride, follow up with his endocrinologist   5. HIV  -Well controlled. Continue his treatment . His recent CD4 count was normal.   6. Hypertension and coronary artery disease  -He will continue follow-up with his primary care physician and cardiologist. Continue medications.   7. depression -worse since disease progression, not suicidal.  8. CODE STATUS -Has a long conversation about goals of care and CODE STATUS. Patient is agreeable with DO NOT RESUSCITATE and DO NOT INTUBATE -I encouraged him to call us if he has worsening short of breast, and we can see him in our symptom management clinic, instead of going to emergency room.   Plan -lab reviewed, will proceed C1D8 carboplatin and Taxol today.  -I'll see him back in one week before C1D15 treatment  -He will call me if he is ready to enroll hospice program   All questions were answered. The patient knows to call the clinic with any problems, questions or concerns.   I spent 30 minutes counseling the patient face to face. The total time spent in the appointment was 35 minutes and more than 50% was on counseling.     Truitt Merle, MD 10/27/2015

## 2015-10-27 NOTE — Patient Instructions (Signed)
[facility]charge Instructions for Patients Receiving Chemotherapy  Today you received the following chemotherapy agents taxol/carboplatin  To help prevent nausea and vomiting after your treatment, we encourage you to take your nausea medication as directed: DO NOT take ondansetron (zofran) at home for next 3 days.     If you develop nausea and vomiting that is not controlled by your nausea medication, call the clinic.   BELOW ARE SYMPTOMS THAT SHOULD BE REPORTED IMMEDIATELY:  *FEVER GREATER THAN 100.5 F  *CHILLS WITH OR WITHOUT FEVER  NAUSEA AND VOMITING THAT IS NOT CONTROLLED WITH YOUR NAUSEA MEDICATION  *UNUSUAL SHORTNESS OF BREATH  *UNUSUAL BRUISING OR BLEEDING  TENDERNESS IN MOUTH AND THROAT WITH OR WITHOUT PRESENCE OF ULCERS  *URINARY PROBLEMS  *BOWEL PROBLEMS  UNUSUAL RASH Items with * indicate a potential emergency and should be followed up as soon as possible.  Feel free to call the clinic you have any questions or concerns. The clinic phone number is [phone].  Paclitaxel injection What is this medicine? PACLITAXEL (PAK li TAX el) is a chemotherapy drug. It targets fast dividing cells, like cancer cells, and causes these cells to die. This medicine is used to treat ovarian cancer, breast cancer, and other cancers. This medicine may be used for other purposes; ask your health care provider or pharmacist if you have questions. What should I tell my health care provider before I take this medicine? They need to know if you have any of these conditions: -blood disorders -irregular heartbeat -infection (especially a virus infection such as chickenpox, cold sores, or herpes) -liver disease -previous or ongoing radiation therapy -an unusual or allergic reaction to paclitaxel, alcohol, polyoxyethylated castor oil, other chemotherapy agents, other medicines, foods, dyes, or preservatives -pregnant or trying to get pregnant -breast-feeding How  should I use this medicine? This drug is given as an infusion into a vein. It is administered in a hospital or clinic by a specially trained health care professional. Talk to your pediatrician regarding the use of this medicine in children. Special care may be needed. Overdosage: If you think you have taken too much of this medicine contact a poison control center or emergency room at once. NOTE: This medicine is only for you. Do not share this medicine with others. What if I miss a dose? It is important not to miss your dose. Call your doctor or health care professional if you are unable to keep an appointment. What may interact with this medicine? Do not take this medicine with any of the following medications: -disulfiram -metronidazole This medicine may also interact with the following medications: -cyclosporine -diazepam -ketoconazole -medicines to increase blood counts like filgrastim, pegfilgrastim, sargramostim -other chemotherapy drugs like cisplatin, doxorubicin, epirubicin, etoposide, teniposide, vincristine -quinidine -testosterone -vaccines -verapamil Talk to your doctor or health care professional before taking any of these medicines: -acetaminophen -aspirin -ibuprofen -ketoprofen -naproxen This list may not describe all possible interactions. Give your health care provider a list of all the medicines, herbs, non-prescription drugs, or dietary supplements you use. Also tell them if you smoke, drink alcohol, or use illegal drugs. Some items may interact with your medicine. What should I watch for while using this medicine? Your condition will be monitored carefully while you are receiving this medicine. You will need important blood work done while you are taking this medicine. This drug may make you feel generally unwell. This is not uncommon, as chemotherapy can affect healthy cells as well as cancer cells. Report any  side effects. Continue your course of treatment even  though you feel ill unless your doctor tells you to stop. This medicine can cause serious allergic reactions. To reduce your risk you will need to take other medicine(s) before treatment with this medicine. In some cases, you may be given additional medicines to help with side effects. Follow all directions for their use. Call your doctor or health care professional for advice if you get a fever, chills or sore throat, or other symptoms of a cold or flu. Do not treat yourself. This drug decreases your body's ability to fight infections. Try to avoid being around people who are sick. This medicine may increase your risk to bruise or bleed. Call your doctor or health care professional if you notice any unusual bleeding. Be careful brushing and flossing your teeth or using a toothpick because you may get an infection or bleed more easily. If you have any dental work done, tell your dentist you are receiving this medicine. Avoid taking products that contain aspirin, acetaminophen, ibuprofen, naproxen, or ketoprofen unless instructed by your doctor. These medicines may hide a fever. Do not become pregnant while taking this medicine. Women should inform their doctor if they wish to become pregnant or think they might be pregnant. There is a potential for serious side effects to an unborn child. Talk to your health care professional or pharmacist for more information. Do not breast-feed an infant while taking this medicine. Men are advised not to father a child while receiving this medicine. This product may contain alcohol. Ask your pharmacist or healthcare provider if this medicine contains alcohol. Be sure to tell all healthcare providers you are taking this medicine. Certain medicines, like metronidazole and disulfiram, can cause an unpleasant reaction when taken with alcohol. The reaction includes flushing, headache, nausea, vomiting, sweating, and increased thirst. The reaction can last from 30 minutes to  several hours. What side effects may I notice from receiving this medicine? Side effects that you should report to your doctor or health care professional as soon as possible: -allergic reactions like skin rash, itching or hives, swelling of the face, lips, or tongue -low blood counts - This drug may decrease the number of white blood cells, red blood cells and platelets. You may be at increased risk for infections and bleeding. -signs of infection - fever or chills, cough, sore throat, pain or difficulty passing urine -signs of decreased platelets or bleeding - bruising, pinpoint red spots on the skin, black, tarry stools, nosebleeds -signs of decreased red blood cells - unusually weak or tired, fainting spells, lightheadedness -breathing problems -chest pain -high or low blood pressure -mouth sores -nausea and vomiting -pain, swelling, redness or irritation at the injection site -pain, tingling, numbness in the hands or feet -slow or irregular heartbeat -swelling of the ankle, feet, hands Side effects that usually do not require medical attention (report to your doctor or health care professional if they continue or are bothersome): -bone pain -complete hair loss including hair on your head, underarms, pubic hair, eyebrows, and eyelashes -changes in the color of fingernails -diarrhea -loosening of the fingernails -loss of appetite -muscle or joint pain -red flush to skin -sweating This list may not describe all possible side effects. Call your doctor for medical advice about side effects. You may report side effects to FDA at 1-800-FDA-1088. Where should I keep my medicine? This drug is given in a hospital or clinic and will not be stored at home. NOTE: This sheet is  a summary. It may not cover all possible information. If you have questions about this medicine, talk to your doctor, pharmacist, or health care provider.    2016, [employer]. (2015-01-27  13:02:56)  Carboplatin injection What is this medicine? CARBOPLATIN (KAR boe pla tin) is a chemotherapy drug. It targets fast dividing cells, like cancer cells, and causes these cells to die. This medicine is used to treat ovarian cancer and many other cancers. This medicine may be used for other purposes; ask your health care provider or pharmacist if you have questions. What should I tell my health care provider before I take this medicine? They need to know if you have any of these conditions: -blood disorders -hearing problems -kidney disease -recent or ongoing radiation therapy -an unusual or allergic reaction to carboplatin, cisplatin, other chemotherapy, other medicines, foods, dyes, or preservatives -pregnant or trying to get pregnant -breast-feeding How should I use this medicine? This drug is usually given as an infusion into a vein. It is administered in a hospital or clinic by a specially trained health care professional. Talk to your pediatrician regarding the use of this medicine in children. Special care may be needed. Overdosage: If you think you have taken too much of this medicine contact a poison control center or emergency room at once. NOTE: This medicine is only for you. Do not share this medicine with others. What if I miss a dose? It is important not to miss a dose. Call your doctor or health care professional if you are unable to keep an appointment. What may interact with this medicine? -medicines for seizures -medicines to increase blood counts like filgrastim, pegfilgrastim, sargramostim -some antibiotics like amikacin, gentamicin, neomycin, streptomycin, tobramycin -vaccines Talk to your doctor or health care professional before taking any of these medicines: -acetaminophen -aspirin -ibuprofen -ketoprofen -naproxen This list may not describe all possible interactions. Give your health care provider a list of all the medicines, herbs, non-prescription  drugs, or dietary supplements you use. Also tell them if you smoke, drink alcohol, or use illegal drugs. Some items may interact with your medicine. What should I watch for while using this medicine? Your condition will be monitored carefully while you are receiving this medicine. You will need important blood work done while you are taking this medicine. This drug may make you feel generally unwell. This is not uncommon, as chemotherapy can affect healthy cells as well as cancer cells. Report any side effects. Continue your course of treatment even though you feel ill unless your doctor tells you to stop. In some cases, you may be given additional medicines to help with side effects. Follow all directions for their use. Call your doctor or health care professional for advice if you get a fever, chills or sore throat, or other symptoms of a cold or flu. Do not treat yourself. This drug decreases your body's ability to fight infections. Try to avoid being around people who are sick. This medicine may increase your risk to bruise or bleed. Call your doctor or health care professional if you notice any unusual bleeding. Be careful brushing and flossing your teeth or using a toothpick because you may get an infection or bleed more easily. If you have any dental work done, tell your dentist you are receiving this medicine. Avoid taking products that contain aspirin, acetaminophen, ibuprofen, naproxen, or ketoprofen unless instructed by your doctor. These medicines may hide a fever. Do not become pregnant while taking this medicine. Women should inform their doctor  if they wish to become pregnant or think they might be pregnant. There is a potential for serious side effects to an unborn child. Talk to your health care professional or pharmacist for more information. Do not breast-feed an infant while taking this medicine. What side effects may I notice from receiving this medicine? Side effects that you should  report to your doctor or health care professional as soon as possible: -allergic reactions like skin rash, itching or hives, swelling of the face, lips, or tongue -signs of infection - fever or chills, cough, sore throat, pain or difficulty passing urine -signs of decreased platelets or bleeding - bruising, pinpoint red spots on the skin, black, tarry stools, nosebleeds -signs of decreased red blood cells - unusually weak or tired, fainting spells, lightheadedness -breathing problems -changes in hearing -changes in vision -chest pain -high blood pressure -low blood counts - This drug may decrease the number of white blood cells, red blood cells and platelets. You may be at increased risk for infections and bleeding. -nausea and vomiting -pain, swelling, redness or irritation at the injection site -pain, tingling, numbness in the hands or feet -problems with balance, talking, walking -trouble passing urine or change in the amount of urine Side effects that usually do not require medical attention (report to your doctor or health care professional if they continue or are bothersome): -hair loss -loss of appetite -metallic taste in the mouth or changes in taste This list may not describe all possible side effects. Call your doctor for medical advice about side effects. You may report side effects to FDA at 1-800-FDA-1088. Where should I keep my medicine? This drug is given in a hospital or clinic and will not be stored at home. NOTE: This sheet is a summary. It may not cover all possible information. If you have questions about this medicine, talk to your doctor, pharmacist, or health care provider.    2016, [employer]. (2007-09-16 14:38:05)

## 2015-11-03 ENCOUNTER — Ambulatory Visit (HOSPITAL_BASED_OUTPATIENT_CLINIC_OR_DEPARTMENT_OTHER): Payer: Managed Care, Other (non HMO) | Admitting: Hematology

## 2015-11-03 ENCOUNTER — Ambulatory Visit: Payer: Managed Care, Other (non HMO)

## 2015-11-03 ENCOUNTER — Ambulatory Visit: Payer: Managed Care, Other (non HMO) | Admitting: Physical Therapy

## 2015-11-03 ENCOUNTER — Ambulatory Visit (HOSPITAL_BASED_OUTPATIENT_CLINIC_OR_DEPARTMENT_OTHER): Payer: Managed Care, Other (non HMO)

## 2015-11-03 ENCOUNTER — Telehealth: Payer: Self-pay | Admitting: Hematology

## 2015-11-03 ENCOUNTER — Other Ambulatory Visit (HOSPITAL_BASED_OUTPATIENT_CLINIC_OR_DEPARTMENT_OTHER): Payer: Managed Care, Other (non HMO)

## 2015-11-03 ENCOUNTER — Encounter: Payer: Self-pay | Admitting: Hematology

## 2015-11-03 VITALS — BP 105/73 | HR 94 | Resp 20

## 2015-11-03 VITALS — BP 102/60 | HR 101 | Temp 98.2°F | Resp 20 | Ht 72.0 in | Wt 182.7 lb

## 2015-11-03 DIAGNOSIS — C7951 Secondary malignant neoplasm of bone: Secondary | ICD-10-CM | POA: Diagnosis not present

## 2015-11-03 DIAGNOSIS — I10 Essential (primary) hypertension: Secondary | ICD-10-CM

## 2015-11-03 DIAGNOSIS — C211 Malignant neoplasm of anal canal: Secondary | ICD-10-CM

## 2015-11-03 DIAGNOSIS — E1165 Type 2 diabetes mellitus with hyperglycemia: Secondary | ICD-10-CM

## 2015-11-03 DIAGNOSIS — C210 Malignant neoplasm of anus, unspecified: Secondary | ICD-10-CM

## 2015-11-03 DIAGNOSIS — G893 Neoplasm related pain (acute) (chronic): Secondary | ICD-10-CM

## 2015-11-03 DIAGNOSIS — F329 Major depressive disorder, single episode, unspecified: Secondary | ICD-10-CM

## 2015-11-03 DIAGNOSIS — F32A Depression, unspecified: Secondary | ICD-10-CM

## 2015-11-03 DIAGNOSIS — I2510 Atherosclerotic heart disease of native coronary artery without angina pectoris: Secondary | ICD-10-CM

## 2015-11-03 DIAGNOSIS — Z794 Long term (current) use of insulin: Secondary | ICD-10-CM

## 2015-11-03 DIAGNOSIS — B20 Human immunodeficiency virus [HIV] disease: Secondary | ICD-10-CM

## 2015-11-03 DIAGNOSIS — Z5111 Encounter for antineoplastic chemotherapy: Secondary | ICD-10-CM

## 2015-11-03 DIAGNOSIS — Z21 Asymptomatic human immunodeficiency virus [HIV] infection status: Secondary | ICD-10-CM

## 2015-11-03 DIAGNOSIS — IMO0001 Reserved for inherently not codable concepts without codable children: Secondary | ICD-10-CM

## 2015-11-03 LAB — CBC WITH DIFFERENTIAL/PLATELET
BASO%: 1.4 % (ref 0.0–2.0)
Basophils Absolute: 0 10*3/uL (ref 0.0–0.1)
EOS%: 2.1 % (ref 0.0–7.0)
Eosinophils Absolute: 0.1 10*3/uL (ref 0.0–0.5)
HCT: 31.9 % — ABNORMAL LOW (ref 38.4–49.9)
HEMOGLOBIN: 10.5 g/dL — AB (ref 13.0–17.1)
LYMPH%: 13.1 % — AB (ref 14.0–49.0)
MCH: 29.2 pg (ref 27.2–33.4)
MCHC: 32.9 g/dL (ref 32.0–36.0)
MCV: 88.6 fL (ref 79.3–98.0)
MONO#: 0.4 10*3/uL (ref 0.1–0.9)
MONO%: 13.8 % (ref 0.0–14.0)
NEUT%: 69.6 % (ref 39.0–75.0)
NEUTROS ABS: 2 10*3/uL (ref 1.5–6.5)
PLATELETS: 254 10*3/uL (ref 140–400)
RBC: 3.6 10*6/uL — ABNORMAL LOW (ref 4.20–5.82)
RDW: 16.2 % — AB (ref 11.0–14.6)
WBC: 2.8 10*3/uL — AB (ref 4.0–10.3)
lymph#: 0.4 10*3/uL — ABNORMAL LOW (ref 0.9–3.3)

## 2015-11-03 LAB — COMPREHENSIVE METABOLIC PANEL
ALBUMIN: 2.5 g/dL — AB (ref 3.5–5.0)
ALT: 32 U/L (ref 0–55)
AST: 32 U/L (ref 5–34)
Alkaline Phosphatase: 174 U/L — ABNORMAL HIGH (ref 40–150)
Anion Gap: 9 mEq/L (ref 3–11)
BILIRUBIN TOTAL: 0.33 mg/dL (ref 0.20–1.20)
BUN: 17 mg/dL (ref 7.0–26.0)
CO2: 20 meq/L — AB (ref 22–29)
CREATININE: 1.1 mg/dL (ref 0.7–1.3)
Calcium: 8.4 mg/dL (ref 8.4–10.4)
Chloride: 105 mEq/L (ref 98–109)
EGFR: 72 mL/min/{1.73_m2} — ABNORMAL LOW (ref >90)
GLUCOSE: 222 mg/dL — AB (ref 70–140)
Potassium: 4.1 mEq/L (ref 3.5–5.1)
SODIUM: 134 meq/L — AB (ref 136–145)
TOTAL PROTEIN: 6.6 g/dL (ref 6.4–8.3)

## 2015-11-03 MED ORDER — SODIUM CHLORIDE 0.9 % IV SOLN
Freq: Once | INTRAVENOUS | Status: AC
  Administered 2015-11-03: 09:00:00 via INTRAVENOUS

## 2015-11-03 MED ORDER — PALONOSETRON HCL INJECTION 0.25 MG/5ML
INTRAVENOUS | Status: AC
  Filled 2015-11-03: qty 5

## 2015-11-03 MED ORDER — SODIUM CHLORIDE 0.9 % IV SOLN
234.8000 mg | Freq: Once | INTRAVENOUS | Status: AC
  Administered 2015-11-03: 230 mg via INTRAVENOUS
  Filled 2015-11-03: qty 23

## 2015-11-03 MED ORDER — SODIUM CHLORIDE 0.9 % IV SOLN
20.0000 mg | Freq: Once | INTRAVENOUS | Status: AC
  Administered 2015-11-03: 20 mg via INTRAVENOUS
  Filled 2015-11-03: qty 2

## 2015-11-03 MED ORDER — FAMOTIDINE IN NACL 20-0.9 MG/50ML-% IV SOLN
INTRAVENOUS | Status: AC
  Filled 2015-11-03: qty 50

## 2015-11-03 MED ORDER — DEXAMETHASONE 4 MG PO TABS: 4.0000 mg | ORAL_TABLET | Freq: Every day | ORAL | Status: DC

## 2015-11-03 MED ORDER — SODIUM CHLORIDE 0.9 % IJ SOLN
10.0000 mL | INTRAMUSCULAR | Status: DC | PRN
  Administered 2015-11-03: 10 mL via INTRAVENOUS
  Filled 2015-11-03: qty 10

## 2015-11-03 MED ORDER — ONDANSETRON HCL 8 MG PO TABS: 8.0000 mg | ORAL_TABLET | Freq: Two times a day (BID) | ORAL | Status: DC | PRN

## 2015-11-03 MED ORDER — HEPARIN SOD (PORK) LOCK FLUSH 100 UNIT/ML IV SOLN
500.0000 [IU] | Freq: Once | INTRAVENOUS | Status: AC | PRN
  Administered 2015-11-03: 500 [IU]
  Filled 2015-11-03: qty 5

## 2015-11-03 MED ORDER — PROCHLORPERAZINE MALEATE 10 MG PO TABS: 10.0000 mg | ORAL_TABLET | Freq: Four times a day (QID) | ORAL | Status: DC | PRN

## 2015-11-03 MED ORDER — DIPHENHYDRAMINE HCL 50 MG/ML IJ SOLN
INTRAMUSCULAR | Status: AC
  Filled 2015-11-03: qty 1

## 2015-11-03 MED ORDER — SODIUM CHLORIDE 0.9 % IV SOLN
80.0000 mg/m2 | Freq: Once | INTRAVENOUS | Status: AC
  Administered 2015-11-03: 168 mg via INTRAVENOUS
  Filled 2015-11-03: qty 28

## 2015-11-03 MED ORDER — PALONOSETRON HCL INJECTION 0.25 MG/5ML
0.2500 mg | Freq: Once | INTRAVENOUS | Status: AC
  Administered 2015-11-03: 0.25 mg via INTRAVENOUS

## 2015-11-03 MED ORDER — SODIUM CHLORIDE 0.9% FLUSH
10.0000 mL | INTRAVENOUS | Status: DC | PRN
  Administered 2015-11-03: 10 mL
  Filled 2015-11-03: qty 10

## 2015-11-03 MED ORDER — FAMOTIDINE IN NACL 20-0.9 MG/50ML-% IV SOLN
20.0000 mg | Freq: Once | INTRAVENOUS | Status: AC
  Administered 2015-11-03: 20 mg via INTRAVENOUS

## 2015-11-03 MED ORDER — DIPHENHYDRAMINE HCL 50 MG/ML IJ SOLN
50.0000 mg | Freq: Once | INTRAMUSCULAR | Status: AC
  Administered 2015-11-03: 50 mg via INTRAVENOUS

## 2015-11-03 NOTE — Telephone Encounter (Signed)
Pt will p/u new sched in tx room

## 2015-11-03 NOTE — Progress Notes (Signed)
[place] NOTE  Patient Care Team: Julian Ramsay, MD as PCP - General (Infectious Diseases) Julian Merritts, MD as Consulting Physician (Cardiology) Julian Bellow, MD (General Surgery) Julian Ruff, MD as Consulting Physician (General Surgery) Julian Limbo, MD as Consulting Physician (Plastic Surgery) Julian Merle, MD as Consulting Physician (Hematology) Julian Aloe, MD as Consulting Physician (Urology) Julian Pray, MD as Consulting Physician (Radiation Oncology)  DIAGNOSIS Follow-up recurrent anal cancer    Primary cancer of anal canal ([place])   02/05/2011 Initial Diagnosis Primary cancer of anal canal, T1N0M0, s/p surgical resection with positive margines. He received definitive dose RT 45 Gy over 5 weeks with 16Gy boosting and concurrent chemo with 5FU, treated at [facility] by Drs.  Geoffreyh and [employer].    11/23/2013 Relapse/Recurrence local recurrence    04/21/2014 Surgery abdominal perianal resection (APR) and VRAM flap closure of perineal wound on 04/21/14, surgical margins were positive for cancer    06/28/2014 - 09/25/2014 Chemotherapy 5-FU 1000mg /m2/day on D1-5, ciplatin 100mg /m2 on D2, every 28 days, s/p 4 cycles    10/11/2014 Progression CT CAP showed new liver lesions and abd/pelc adenopathy, highly suspecious for progressive metastatic disease    11/02/2014 Imaging abdomen MRI w wo contrast showed new small lesions in the right hepatic lobe with nonspecific enhancement, and enlarging nodes in the porta hepatis, metastatic disease not excluded    02/24/2015 Imaging PET scan showed hypermetabolic bone mets in right sacrum, and small hypermetabolic retroperitoneal lymph nodes, no other abnormal metabolic activity within the liver or other place.   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   04/15/2015 - 08/18/2015 Chemotherapy Nivolumab 240 mg every 2 weeks, stopped due to disease  progression    07/07/2015 Imaging PET scan showed interval progression of lytic metastasis involving the righ side of sacrum, pathologic fracture involving the right sacral wing. Improvement in retroperitoneal lymph node metastasis. 7 mm nodule in the left upper lobe lung, indeterminate.   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   10/20/2015 -  Chemotherapy Weekly carboplatin AUC 2, Taxol 80 mg/m, 3 weeks on, one-week off     OTHER RELATED ISSUES: 1. (+) HIV, on therapy with good control  2. CAD, s/p stent placement in 1998  I  CURRENT THERAPY: Weekly carboplatin AUC 2, Taxol 80 mg/m, 3 weeks on, one-week off, starting 10/20/2015  INTERIM HISTORY: Julian Palmer returns for follow up and C1D15 chemo. He noticed more nausea after the second week of chemotherapy, usually in the morning, he has been taking Zofran and Compazine. No vomiting, he is able to eat small amounts, appetite is fair. He did have a few episodes of diarrhea, and made mass in the bathroom when he change his colostomy bag. His dyspnea was stable, we'll slightly improved, he was able to take off oxygen for few hours yesterday. Cough is stable, no sputum production, no fever or chills. He has moved to his sister's house, lives on the first fall, able to use bathroom on his own. He has stopped working, no other new complaints.  MEDICAL HISTORY:  Past Medical History  Diagnosis Date  . S/p bare metal coronary artery stent     1998  pLAD  . Hypertension   . History of rectal cancer  04/ 2012--  invasive squamous cell carcinoma at 9 o'clock position, poorly differentiated s/p resection and  30 cylces radiation / chemo therapy for 2 weeks  . Hyperlipidemia   . History of carcinoma in  situ of anal canal     2010  . ED (erectile dysfunction)   . Sigmoid diverticulosis   . PONV (postoperative nausea and vomiting)   . Wears glasses   . At risk for sleep apnea     STOP-BANG= 4     SENT TO PCP 03-23-2014  . Coronary artery disease CARDIOLOGIST--  DR Rockey Situ --  LAST LOV 04-17-2013    S/P  PCI to mid and distal LAD and BM stent x1 to pLAD  . Anxiety   . Depression   . HIV positive ([place])   . Type 2 diabetes mellitus ([place])   . Radiation 01/2011    definitive dose RT 45 Gy over 5 weeks with 16Gy boosting  . 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 Primary cancer of anal canal ([place]) 05/18/2011    SURGICAL HISTORY: Past Surgical History  Procedure Laterality Date  . Septoplasty  1985  . Sphincterotomy  2009  . Cardiovascular stress test  04/ 2010    low risk study/  very small region of mildly reduced perfersion is mildly reversible dLAD territory  . Resection anal mass  02-05-2011  . Excision anal polyp  2009  . Re-excision anal squamous cell carcinoma  2010  . Removal rectal mass  01-08-2014  . Eua w/ bx perianal skin  06-08-2009  . Port-a-cath placement  02-21-2011    REMOVED  2013  . Coronary angioplasty with stent placement  1998   [facility]    BM stent to pLAD and PCI to Mid and Distal LAD/  no other significant cad  . Inguinal hernia repair Bilateral 1988  . Colonoscopy with propofol  05-28-2008  . Mass excision N/A 03/26/2014    Procedure: ANAL EXAM UNDER ANESTHESIA,EXCISIONAL BIOPSY OF ANAL MASS;  Surgeon: Julian Ruff, MD;  Location: [facility];  Service: General;  Laterality: N/A;  . Laparoscopic assisted abdominal perineal resection N/A 04/22/2014    Procedure: LAPAROSCOPIC  ABDOMINAL PERINEAL RESECTION, ;  Surgeon: Julian Ruff, MD;  Location: [facility];  Service: General;  Laterality: N/A;  . Urethrotomy Bilateral 04/22/2014    Procedure: CYSTOSCOPY/URETHROTOMY;  Surgeon: Julian Ruff, MD;  Location: [facility];  Service: General;  Laterality: Bilateral;  . Muscle flap closure N/A 04/22/2014    Procedure: VRAM FLAP;  Surgeon: Julian Limbo, MD;  Location: [facility];  Service: Plastics;  Laterality: N/A;  . Portacath placement Left 06/24/2014    Procedure: INSERTION PORT-A-CATH LEFT SUBCLAVIAN;  Surgeon: Julian Ruff, MD;  Location: [facility];  Service: General;  Laterality: Left;    SOCIAL HISTORY: Social History   Social History  . Marital Status: Single    Spouse Name: N/A  . Number of Children: 0  . Years of Education: N/A   Occupational History  .  [place] History Main Topics  . Smoking status: Never Smoker   . Smokeless tobacco: Never Used  . Alcohol Use: No     Comment: RARE  . Drug Use: No  . Sexual Activity: Not on file   Other Topics Concern  . Not on file   Social History Narrative   Full time. Single. Does not regularly exercise.    Lives alone, his mother  lives close by    FAMILY HISTORY: Family History  Problem Relation Age of Onset  . Diabetes type II Mother   . Breast cancer Mother 108     survivor  . Hypertension Mother   . Coronary artery disease Father   Paternal GM had utrine cancer, no other history of malignancy   ALLERGIES:  is allergic to sulfa antibiotics.  MEDICATIONS:  Current Outpatient Prescriptions  Medication Sig Dispense Refill  . aspirin (ASPIR-81) 81 MG EC tablet Take 81 mg by mouth at bedtime.     Marland Kitchen atorvastatin (LIPITOR) 80 MG tablet Take 1 tablet (80 mg total) by mouth every evening. 90 tablet 4  . efavirenz-emtrictabine-tenofovir (ATRIPLA) 600-200-300 MG per tablet Take 1 tablet by mouth at bedtime.     . Empagliflozin-Linagliptin (GLYXAMBI) 25-5 MG TABS Take 1 tablet by mouth daily.    Marland Kitchen gabapentin (NEURONTIN) 300 MG capsule Take 1 capsule (300 mg total) by mouth 3 (three) times daily. 30 capsule 2  . glimepiride (AMARYL) 2 MG tablet Take 4 mg by mouth daily with breakfast.     . HYDROcodone-acetaminophen  (NORCO/VICODIN) 5-325 MG tablet Take 1 tablet by mouth every 8 (eight) hours as needed for moderate pain. 60 tablet 0  . HYDROcodone-homatropine (HYCODAN) 5-1.5 MG/5ML syrup Take 5 mLs by mouth every 6 (six) hours as needed for cough. 120 mL 0  . losartan (COZAAR) 100 MG tablet Take 1 tablet (100 mg total) by mouth daily. 90 tablet 4  . metFORMIN (GLUCOPHAGE) 1000 MG tablet Take 1,000 mg by mouth 2 (two) times daily.      . ondansetron (ZOFRAN) 8 MG tablet Take 1 tablet (8 mg total) by mouth 2 (two) times daily as needed for nausea or vomiting. Take 8 mg by mouth 2 times daily for 3 days and as needed for nausea -  Start on third  day after chemo . 30 tablet 1  . oxyCODONE-acetaminophen (PERCOCET/ROXICET) 5-325 MG tablet Take 1 tablet by mouth every 8 (eight) hours as needed for moderate pain or severe pain. 30 tablet 0  . prochlorperazine (COMPAZINE) 10 MG tablet Take 1 tablet (10 mg total) by mouth every 6 (six) hours as needed for nausea or vomiting. 30 tablet 1   No current facility-administered medications for this visit.   Facility-Administered Medications Ordered in Other Visits  Medication Dose Route Frequency Provider Last Rate Last Dose  . CARBOplatin (PARAPLATIN) 230 mg in sodium chloride 0.9 % 100 mL chemo infusion  230 mg Intravenous Once Julian Merle, MD 246 mL/hr at 11/03/15 1118 230 mg at 11/03/15 1118  . heparin lock flush 100 unit/mL  500 Units Intravenous Once Julian Merle, MD      . heparin lock flush 100 unit/mL  500 Units Intracatheter Once PRN Julian Merle, MD      . sodium chloride 0.9 % injection 10 mL  10 mL Intravenous PRN Julian Merle, MD      . sodium chloride flush (NS) 0.9 % injection 10 mL  10 mL Intracatheter PRN Julian Merle, MD        REVIEW OF SYSTEMS:   Constitutional: Denies fevers, chills or abnormal night sweats Eyes: Denies blurriness of vision, double vision or watery eyes Ears, nose, mouth, throat, and face: Denies mucositis or sore throat Respiratory: Denies cough,  dyspnea or wheezes Cardiovascular: Denies palpitation, chest discomfort or lower extremity swelling Gastrointestinal:  Denies nausea, heartburn or change in bowel habits, (+) colostomy bag Skin: Mild skin pigmentation from his previous  rash. Lymphatics: Denies new lymphadenopathy or easy bruising Neurological:Denies numbness, tingling or new weaknesses Behavioral/Psych: Mood is stable, no new changes  All other systems were reviewed with the patient and are negative.  PHYSICAL EXAMINATION: ECOG PERFORMANCE STATUS: 3 BP 102/60 mmHg  Pulse 101  Temp(Src) 98.2 F (36.8 C) (Oral)  Resp 20  Ht 6' (1.829 m)  Wt 182 lb 11.2 oz (82.872 kg)  BMI 24.77 kg/m2  SpO2 99%  GENERAL:alert, no distress, sits in a wheelchair comfortably SKIN: skin color, texture, turgor are normal, healed rashes on her arms, legs, and trunk with skin pigmentation.  EYES: normal, conjunctiva are pink and non-injected, sclera clear OROPHARYNX:no exudate, no erythema and lips, buccal mucosa, and tongue normal  NECK: supple, thyroid normal size, non-tender, without nodularity LYMPH:  no palpable lymphadenopathy in the cervical, axillary or inguinal LUNGS: clear to auscultation and percussion with normal breathing effort HEART: regular rate & rhythm and no murmurs and no lower extremity edema ABDOMEN:abdomen soft, non-tender and normal bowel sounds. (+) Colostomy bag. The perianal reconstruction site is completely healed.  Musculoskeletal:no cyanosis of digits and no clubbing  PSYCH: alert & oriented x 3 with fluent speech NEURO: no focal motor/sensory deficits SKIN: A few skin pigmentation from his previous rash  LABORATORY DATA:  I have reviewed the data as listed CBC Latest Ref Rng 11/03/2015 10/27/2015 10/20/2015  WBC 4.0 - 10.3 10e3/uL 2.8(L) 5.8 8.0  Hemoglobin 13.0 - 17.1 g/dL 10.5(L) 12.6(L) 13.8  Hematocrit 38.4 - 49.9 % 31.9(L) 36.9(L) 41.3  Platelets 140 - 400 10e3/uL 254 262 281    CMP Latest Ref Rng  11/03/2015 10/27/2015 10/20/2015  Glucose 70 - 140 mg/dl 222(H) 243(H) 238(H)  BUN 7.0 - 26.0 mg/dL 17.0 25.9 23.3  Creatinine 0.7 - 1.3 mg/dL 1.1 1.3 1.4(H)  Sodium 136 - 145 mEq/L 134(L) 132(L) 134(L)  Potassium 3.5 - 5.1 mEq/L 4.1 4.2 4.3  CO2 22 - 29 mEq/L 20(L) 23 21(L)  Calcium 8.4 - 10.4 mg/dL 8.4 9.1 9.6  Total Protein 6.4 - 8.3 g/dL 6.6 7.4 8.1  Total Bilirubin 0.20 - 1.20 mg/dL 0.33 0.45 0.39  Alkaline Phos 40 - 150 U/L 174(H) 193(H) 215(H)  AST 5 - 34 U/L 32 36(H) 29  ALT 0 - 55 U/L 32 35 31     PATHOLOGY REPORT 04/22/2014 Diagnosis 1. Soft tissue, biopsy, right pelvic sidewall; r/o malignancy - FIBROADIPOSE TISSUE, NO EVIDENCE OF MALIGNANCY. 2. Colon, segmental resection for tumor, rectum,sigmoid and anus - RECURRENT INVASIVE POORLY DIFFERENTIATED SQUAMOUS CELL CARCINOMA, INVADING THROUGH THE MUSCULARIS PROPRIA, INTO PERICOLONIC FATTY TISSUE, EXTENDING TO THE DEEP INKED MARGIN. - PERINEURAL INVASION AND ANGIOLYMPHATIC PRESENT. - SEVEN OF SEVENTEEN LYMPH NODES, POSITIVE FOR METASTATIC CARCINOMA (7/17) Microscopic Comment 2. Anus Specimen: Anus, rectum, sigmoid Procedure: Segmental resection Tumor site: Distal ends of specimen Specimen integrity: Intact Invasive tumor: Maximum size: At least 2.5 cm Histologic type(s): Invasive squamous cell carcinoma Histologic grade and differentiation: G2-3, moderately to poorly differentiated Microscopic extension of invasive tumor: Invading through the muscularis propria into pericolonic fatty tissue involving adjacent skeletal muscle tissue Lymph-Vascular invasion: Present Peri-neural invasion: Present Resection margins: Deep margin is positive for tumor Treatment effect (neo-adjuvant therapy): Present, minimally Lymph nodes: number examined 17; number positive: 7 Pathologic Staging: ypT2, yN1 (R1) Ancillary studies: N/A  Diagnosis 03/15/2015 Bone, biopsy, caudal left side of the sacrum - METASTATIC SQUAMOUS CELL CARCINOMA,  SEE COMMENT. Microscopic Comment Biopsies demonstrate extensive involvement by metastatic focally keratinizing, well to moderately differentiated squamous cell carcinoma. The  previous anorectal resection demonstrating invasive squamous cell carcinoma is noted [id number]). (CRR:ecj 03/16/2015)  RADIOGRAPHIC STUDIES: I have personally reviewed the radiological images as listed and agreed with the findings in the report.  CT chest PE protocol 10/12/2015 IMPRESSION: 1. No evidence of acute pulmonary embolism. 2. 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 An atypical infection is also a consideration. 3. Calcified gallstones within the gallbladder. 4. Coronary artery calcifications.   ASSESSMENT & PLAN:  55 years old male with past medical history of anal squamous cell carcinoma, status post surgical resection and concurrent chemoradiation in 2009, now has local recurrence status post APR and  reconstruction.ypT2, yN1 (R1), unfortunately his deep surgical margin was positive, and 7 out of 17 lymph nodes were positive also. No distant metastasis on the preop PET scan. His medical history is also complicated by diabetes, coronary artery disease status post stent placement, and HIV on treatment.   1. Local recurrence of anal squamous cell carcinoma, with positive surgical margines, biopsy proven sacral bone mets on 03/15/2015 -His CT chest was reviewed with patient last week, which showed bilateral lung metastasis -He knows this is terminal and incurable, his prognosis is extremely poor, giving he has had multiple lines of therapy. -He would like to try more chemotherapy, although we know the response rate likely going to be low. -He started weekly carbo and Taxol, tolerated well last week -We had a frank and lengthy discussion about his overall poor prognosis, I recommended him to  consider hospice service, vs continue chemotherapy. He decided to continue chemotherapy for now. He has moved to his sister's house with better supporting. -He is clinically stable, dyspnea slightly improved, we'll continue chemotherapy -Lab results reviewed with patient, adequate for treatment, we'll proceed chemotherapy today -His sister came in with him today, and I had a separate discussion with his sister for 10 minutes. We reviewed his cancer diagnosis, treatment options, overall prognosis and her current status. I answered all her questions to her satisfaction.  2. Cough, dyspnea and hypoxia -Secondary to his underlying lung metastasis. -He has tried a course of azithromycin, which did not help -He will continue using oxygen, and cough medicine as needed.  3. Sacral pain -secondary to his sacral bone metastasis, status post palliative radiation -He will continue oxycodone or hydrocodone as needed for pain. -He tried tramadol before, it did not work well -I suggest him to use Tylenol as needed.   4. Diabetes -controlled recently  -continue metformin and glimepiride, follow up with his endocrinologist   5. HIV  -Well controlled. Continue his treatment . His recent CD4 count was normal.   6. Hypertension and coronary artery disease  -He will continue follow-up with his primary care physician and cardiologist. Continue medications.   7. depression -worse since disease progression, not suicidal.  8. CODE STATUS -Has a long conversation about goals of care and CODE STATUS. Patient is agreeable with DO NOT RESUSCITATE and DO NOT INTUBATE -I encouraged him to call us if he has worsening short of breast, and we can see him in our symptom management clinic, instead of going to emergency room.   Plan -lab reviewed, will proceed C1D15 carboplatin and Taxol today.  -I'll see him back in two week before C2D1 treatment  -repeat CT in 4-5 weeks   All questions were answered. The patient  knows to call the clinic with any problems, questions or concerns.   I  spent 25 minutes counseling the patient face to face. The total time spent in the appointment was 40 minutes and more than 50% was on counseling.     Julian Merle, MD 11/03/2015

## 2015-11-03 NOTE — Patient Instructions (Signed)
[facility] Discharge Instructions for Patients Receiving Chemotherapy  Today you received the following chemotherapy agents Taxol/Carboplatin To help prevent nausea and vomiting after your treatment, we encourage you to take your nausea medication as prescribed.   If you develop nausea and vomiting that is not controlled by your nausea medication, call the clinic.   BELOW ARE SYMPTOMS THAT SHOULD BE REPORTED IMMEDIATELY:  *FEVER GREATER THAN 100.5 F  *CHILLS WITH OR WITHOUT FEVER  NAUSEA AND VOMITING THAT IS NOT CONTROLLED WITH YOUR NAUSEA MEDICATION  *UNUSUAL SHORTNESS OF BREATH  *UNUSUAL BRUISING OR BLEEDING  TENDERNESS IN MOUTH AND THROAT WITH OR WITHOUT PRESENCE OF ULCERS  *URINARY PROBLEMS  *BOWEL PROBLEMS  UNUSUAL RASH Items with * indicate a potential emergency and should be followed up as soon as possible.  Feel free to call the clinic you have any questions or concerns. The clinic phone number is [phone].  Please show the CHEMO ALERT CARD at check-in to the Emergency Department and triage nurse.   

## 2015-11-03 NOTE — Progress Notes (Signed)
0922: After receiving Benadryl per orders IVP pt started to complain of feeling dizzy and short of breath. Pt laying in supine position and became tearful that he "felt bad." IVF opened wide open. Vitals obtained and as charted. Pt had small coughing attack but per pt this was not abnormal. Pt skin warm and dry. Respirations equal and unlabored. Pulse steady and strong upon palpation at left wrist. Pt returned to baseline after several minutes of IVF. Pt educated that Benadryl given to fast can cause dizziness and pt verbalized understanding. Pt educated that if this continues to happen with Benadryl to discuss possible dose change with MD. Pt verbalized understanding.

## 2015-11-03 NOTE — Patient Instructions (Signed)

## 2015-11-10 ENCOUNTER — Encounter: Payer: Managed Care, Other (non HMO) | Admitting: Physical Therapy

## 2015-11-10 ENCOUNTER — Other Ambulatory Visit: Payer: Managed Care, Other (non HMO)

## 2015-11-10 ENCOUNTER — Ambulatory Visit: Payer: Managed Care, Other (non HMO)

## 2015-11-16 ENCOUNTER — Encounter: Payer: Managed Care, Other (non HMO) | Admitting: Physical Therapy

## 2015-11-17 ENCOUNTER — Encounter: Payer: Self-pay | Admitting: Hematology

## 2015-11-17 ENCOUNTER — Telehealth: Payer: Self-pay | Admitting: *Deleted

## 2015-11-17 ENCOUNTER — Other Ambulatory Visit (HOSPITAL_BASED_OUTPATIENT_CLINIC_OR_DEPARTMENT_OTHER): Payer: Managed Care, Other (non HMO)

## 2015-11-17 ENCOUNTER — Ambulatory Visit (HOSPITAL_BASED_OUTPATIENT_CLINIC_OR_DEPARTMENT_OTHER): Payer: Managed Care, Other (non HMO) | Admitting: Hematology

## 2015-11-17 ENCOUNTER — Telehealth: Payer: Self-pay | Admitting: Hematology

## 2015-11-17 ENCOUNTER — Ambulatory Visit: Payer: Managed Care, Other (non HMO)

## 2015-11-17 ENCOUNTER — Ambulatory Visit (HOSPITAL_BASED_OUTPATIENT_CLINIC_OR_DEPARTMENT_OTHER): Payer: Managed Care, Other (non HMO)

## 2015-11-17 VITALS — BP 121/76 | HR 106 | Temp 97.8°F | Resp 18 | Ht 72.0 in | Wt 180.6 lb

## 2015-11-17 DIAGNOSIS — IMO0001 Reserved for inherently not codable concepts without codable children: Secondary | ICD-10-CM

## 2015-11-17 DIAGNOSIS — C211 Malignant neoplasm of anal canal: Secondary | ICD-10-CM | POA: Diagnosis not present

## 2015-11-17 DIAGNOSIS — Z794 Long term (current) use of insulin: Secondary | ICD-10-CM

## 2015-11-17 DIAGNOSIS — Z21 Asymptomatic human immunodeficiency virus [HIV] infection status: Secondary | ICD-10-CM

## 2015-11-17 DIAGNOSIS — E1165 Type 2 diabetes mellitus with hyperglycemia: Secondary | ICD-10-CM

## 2015-11-17 DIAGNOSIS — C210 Malignant neoplasm of anus, unspecified: Secondary | ICD-10-CM

## 2015-11-17 DIAGNOSIS — I2510 Atherosclerotic heart disease of native coronary artery without angina pectoris: Secondary | ICD-10-CM

## 2015-11-17 DIAGNOSIS — C7951 Secondary malignant neoplasm of bone: Secondary | ICD-10-CM

## 2015-11-17 DIAGNOSIS — B20 Human immunodeficiency virus [HIV] disease: Secondary | ICD-10-CM

## 2015-11-17 DIAGNOSIS — E119 Type 2 diabetes mellitus without complications: Secondary | ICD-10-CM | POA: Diagnosis not present

## 2015-11-17 DIAGNOSIS — F329 Major depressive disorder, single episode, unspecified: Secondary | ICD-10-CM

## 2015-11-17 DIAGNOSIS — I10 Essential (primary) hypertension: Secondary | ICD-10-CM | POA: Diagnosis not present

## 2015-11-17 DIAGNOSIS — Z5111 Encounter for antineoplastic chemotherapy: Secondary | ICD-10-CM | POA: Diagnosis not present

## 2015-11-17 DIAGNOSIS — F32A Depression, unspecified: Secondary | ICD-10-CM

## 2015-11-17 LAB — CBC WITH DIFFERENTIAL/PLATELET
BASO%: 2.1 % — AB (ref 0.0–2.0)
Basophils Absolute: 0.1 10*3/uL (ref 0.0–0.1)
EOS%: 1.8 % (ref 0.0–7.0)
Eosinophils Absolute: 0.1 10*3/uL (ref 0.0–0.5)
HEMATOCRIT: 34.4 % — AB (ref 38.4–49.9)
HEMOGLOBIN: 11.5 g/dL — AB (ref 13.0–17.1)
LYMPH#: 0.7 10*3/uL — AB (ref 0.9–3.3)
LYMPH%: 18.6 % (ref 14.0–49.0)
MCH: 30.4 pg (ref 27.2–33.4)
MCHC: 33.4 g/dL (ref 32.0–36.0)
MCV: 91 fL (ref 79.3–98.0)
MONO#: 0.6 10*3/uL (ref 0.1–0.9)
MONO%: 14.7 % — ABNORMAL HIGH (ref 0.0–14.0)
NEUT#: 2.4 10*3/uL (ref 1.5–6.5)
NEUT%: 62.8 % (ref 39.0–75.0)
PLATELETS: 148 10*3/uL (ref 140–400)
RBC: 3.78 10*6/uL — ABNORMAL LOW (ref 4.20–5.82)
RDW: 18.5 % — AB (ref 11.0–14.6)
WBC: 3.9 10*3/uL — ABNORMAL LOW (ref 4.0–10.3)

## 2015-11-17 LAB — COMPREHENSIVE METABOLIC PANEL
ALBUMIN: 3.2 g/dL — AB (ref 3.5–5.0)
ALK PHOS: 239 U/L — AB (ref 40–150)
ALT: 33 U/L (ref 0–55)
ANION GAP: 12 meq/L — AB (ref 3–11)
AST: 30 U/L (ref 5–34)
BILIRUBIN TOTAL: 0.32 mg/dL (ref 0.20–1.20)
BUN: 18.7 mg/dL (ref 7.0–26.0)
CALCIUM: 9.3 mg/dL (ref 8.4–10.4)
CHLORIDE: 104 meq/L (ref 98–109)
CO2: 22 mEq/L (ref 22–29)
CREATININE: 1.2 mg/dL (ref 0.7–1.3)
EGFR: 69 mL/min/{1.73_m2} — ABNORMAL LOW (ref >90)
Glucose: 179 mg/dl — ABNORMAL HIGH (ref 70–140)
Potassium: 4.4 mEq/L (ref 3.5–5.1)
Sodium: 138 mEq/L (ref 136–145)
TOTAL PROTEIN: 7.6 g/dL (ref 6.4–8.3)

## 2015-11-17 MED ORDER — PALONOSETRON HCL INJECTION 0.25 MG/5ML
0.2500 mg | Freq: Once | INTRAVENOUS | Status: AC
  Administered 2015-11-17: 0.25 mg via INTRAVENOUS

## 2015-11-17 MED ORDER — FAMOTIDINE IN NACL 20-0.9 MG/50ML-% IV SOLN
INTRAVENOUS | Status: AC
  Filled 2015-11-17: qty 50

## 2015-11-17 MED ORDER — DIPHENHYDRAMINE HCL 50 MG/ML IJ SOLN
50.0000 mg | Freq: Once | INTRAMUSCULAR | Status: AC
  Administered 2015-11-17: 50 mg via INTRAVENOUS

## 2015-11-17 MED ORDER — SODIUM CHLORIDE 0.9 % IV SOLN
219.4000 mg | Freq: Once | INTRAVENOUS | Status: AC
  Administered 2015-11-17: 220 mg via INTRAVENOUS
  Filled 2015-11-17: qty 22

## 2015-11-17 MED ORDER — FAMOTIDINE IN NACL 20-0.9 MG/50ML-% IV SOLN
20.0000 mg | Freq: Once | INTRAVENOUS | Status: AC
  Administered 2015-11-17: 20 mg via INTRAVENOUS

## 2015-11-17 MED ORDER — HEPARIN SOD (PORK) LOCK FLUSH 100 UNIT/ML IV SOLN
500.0000 [IU] | Freq: Once | INTRAVENOUS | Status: AC | PRN
  Administered 2015-11-17: 500 [IU]
  Filled 2015-11-17: qty 5

## 2015-11-17 MED ORDER — DIPHENHYDRAMINE HCL 50 MG/ML IJ SOLN
INTRAMUSCULAR | Status: AC
  Filled 2015-11-17: qty 1

## 2015-11-17 MED ORDER — SODIUM CHLORIDE 0.9 % IV SOLN
20.0000 mg | Freq: Once | INTRAVENOUS | Status: AC
  Administered 2015-11-17: 20 mg via INTRAVENOUS
  Filled 2015-11-17: qty 2

## 2015-11-17 MED ORDER — SODIUM CHLORIDE 0.9 % IJ SOLN
10.0000 mL | INTRAMUSCULAR | Status: DC | PRN
  Administered 2015-11-17: 10 mL via INTRAVENOUS
  Filled 2015-11-17: qty 10

## 2015-11-17 MED ORDER — SODIUM CHLORIDE 0.9 % IV SOLN
Freq: Once | INTRAVENOUS | Status: AC
  Administered 2015-11-17: 09:00:00 via INTRAVENOUS

## 2015-11-17 MED ORDER — PALONOSETRON HCL INJECTION 0.25 MG/5ML
INTRAVENOUS | Status: AC
  Filled 2015-11-17: qty 5

## 2015-11-17 MED ORDER — SODIUM CHLORIDE 0.9 % IV SOLN
80.0000 mg/m2 | Freq: Once | INTRAVENOUS | Status: AC
  Administered 2015-11-17: 168 mg via INTRAVENOUS
  Filled 2015-11-17: qty 28

## 2015-11-17 MED ORDER — SODIUM CHLORIDE 0.9% FLUSH
10.0000 mL | INTRAVENOUS | Status: DC | PRN
  Administered 2015-11-17: 10 mL
  Filled 2015-11-17: qty 10

## 2015-11-17 MED ORDER — OXYCODONE-ACETAMINOPHEN 5-325 MG PO TABS: 1.0000 | ORAL_TABLET | Freq: Three times a day (TID) | ORAL | Status: DC | PRN

## 2015-11-17 NOTE — Telephone Encounter (Signed)
Per staff message and POF I have scheduled appts. Advised scheduler of appts. JMW  

## 2015-11-17 NOTE — Patient Instructions (Signed)

## 2015-11-17 NOTE — Progress Notes (Signed)
folder left in my box 11/16/15-- note on folder--patient wants forms mailed back to him--    [place], [place] [id number]

## 2015-11-17 NOTE — Progress Notes (Signed)
Left FMLA folder in box of  [place], care management.

## 2015-11-17 NOTE — Patient Instructions (Signed)
[facility] Discharge Instructions for Patients Receiving Chemotherapy  Today you received the following chemotherapy agents Taxol/Carboplatin To help prevent nausea and vomiting after your treatment, we encourage you to take your nausea medication as prescribed.   If you develop nausea and vomiting that is not controlled by your nausea medication, call the clinic.   BELOW ARE SYMPTOMS THAT SHOULD BE REPORTED IMMEDIATELY:  *FEVER GREATER THAN 100.5 F  *CHILLS WITH OR WITHOUT FEVER  NAUSEA AND VOMITING THAT IS NOT CONTROLLED WITH YOUR NAUSEA MEDICATION  *UNUSUAL SHORTNESS OF BREATH  *UNUSUAL BRUISING OR BLEEDING  TENDERNESS IN MOUTH AND THROAT WITH OR WITHOUT PRESENCE OF ULCERS  *URINARY PROBLEMS  *BOWEL PROBLEMS  UNUSUAL RASH Items with * indicate a potential emergency and should be followed up as soon as possible.  Feel free to call the clinic you have any questions or concerns. The clinic phone number is [phone].  Please show the CHEMO ALERT CARD at check-in to the Emergency Department and triage nurse.   

## 2015-11-17 NOTE — Progress Notes (Signed)
[place] NOTE  Patient Care Team: Leonel Ramsay, MD as PCP - General (Infectious Diseases) Minna Merritts, MD as Consulting Physician (Cardiology) Robert Bellow, MD (General Surgery) Leighton Ruff, MD as Consulting Physician (General Surgery) Irene Limbo, MD as Consulting Physician (Plastic Surgery) Truitt Merle, MD as Consulting Physician (Hematology) Festus Aloe, MD as Consulting Physician (Urology) Gery Pray, MD as Consulting Physician (Radiation Oncology)  DIAGNOSIS Follow-up recurrent anal cancer    Primary cancer of anal canal ([place])   02/05/2011 Initial Diagnosis Primary cancer of anal canal, T1N0M0, s/p surgical resection with positive margines. He received definitive dose RT 45 Gy over 5 weeks with 16Gy boosting and concurrent chemo with 5FU, treated at [facility] by Drs.  Geoffreyh and [employer].    11/23/2013 Relapse/Recurrence local recurrence    04/21/2014 Surgery abdominal perianal resection (APR) and VRAM flap closure of perineal wound on 04/21/14, surgical margins were positive for cancer    06/28/2014 - 09/25/2014 Chemotherapy 5-FU 1000mg /m2/day on D1-5, ciplatin 100mg /m2 on D2, every 28 days, s/p 4 cycles    10/11/2014 Progression CT CAP showed new liver lesions and abd/pelc adenopathy, highly suspecious for progressive metastatic disease    11/02/2014 Imaging abdomen MRI w wo contrast showed new small lesions in the right hepatic lobe with nonspecific enhancement, and enlarging nodes in the porta hepatis, metastatic disease not excluded    02/24/2015 Imaging PET scan showed hypermetabolic bone mets in right sacrum, and small hypermetabolic retroperitoneal lymph nodes, no other abnormal metabolic activity within the liver or other place.   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   04/15/2015 - 08/18/2015 Chemotherapy Nivolumab 240 mg every 2 weeks, stopped due to disease  progression    07/07/2015 Imaging PET scan showed interval progression of lytic metastasis involving the righ side of sacrum, pathologic fracture involving the right sacral wing. Improvement in retroperitoneal lymph node metastasis. 7 mm nodule in the left upper lobe lung, indeterminate.   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   10/20/2015 -  Chemotherapy Weekly carboplatin AUC 2, Taxol 80 mg/m, 3 weeks on, one-week off     OTHER RELATED ISSUES: 1. (+) HIV, on therapy with good control  2. CAD, s/p stent placement in [place]: Weekly carboplatin AUC 2, Taxol 80 mg/m, 3 weeks on, one-week off, starting 10/20/2015  INTERIM HISTORY: Julian Palmer returns for follow up and C2D1 chemo. He has been doing much better since I saw him 2 weeks ago.He is only use oxygen as needed, and he walked in to the exam room by himself without oxygen today. He is able to take care of himself better lately, and he may move back to his house in the near future. His appetite is moderate to low, he eats small meals, and drink boost once a day. He lost about 2 pounds in the past 2 weeks. He noticed a mass in his scrum, no pain or tenderness, he has called his urologist for appointment. No fever or chills.   MEDICAL HISTORY:  Past Medical History  Diagnosis Date  . S/p bare metal coronary artery stent     1998  pLAD  . Hypertension   . 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 Hyperlipidemia   . History of carcinoma in situ of anal canal     2010  . ED (erectile dysfunction)   . Sigmoid diverticulosis   .  PONV (postoperative nausea and vomiting)   . Wears glasses   . At risk for sleep apnea     STOP-BANG= 4     SENT TO PCP 03-23-2014  . Coronary artery disease CARDIOLOGIST--  DR Rockey Situ --  LAST LOV 04-17-2013    S/P  PCI to mid and distal LAD and BM stent x1 to pLAD  . Anxiety   . Depression   . HIV positive ([place])   . Type 2 diabetes mellitus ([place])   . Radiation 01/2011    definitive dose RT 45 Gy over 5 weeks with 16Gy boosting  . 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 Primary cancer of anal canal ([place]) 05/18/2011    SURGICAL HISTORY: Past Surgical History  Procedure Laterality Date  . Septoplasty  1985  . Sphincterotomy  2009  . Cardiovascular stress test  04/ 2010    low risk study/  very small region of mildly reduced perfersion is mildly reversible dLAD territory  . Resection anal mass  02-05-2011  . Excision anal polyp  2009  . Re-excision anal squamous cell carcinoma  2010  . Removal rectal mass  01-08-2014  . Eua w/ bx perianal skin  06-08-2009  . Port-a-cath placement  02-21-2011    REMOVED  2013  . Coronary angioplasty with stent placement  1998   [facility]    BM stent to pLAD and PCI to Mid and Distal LAD/  no other significant cad  . Inguinal hernia repair Bilateral 1988  . Colonoscopy with propofol  05-28-2008  . Mass excision N/A 03/26/2014    Procedure: ANAL EXAM UNDER ANESTHESIA,EXCISIONAL BIOPSY OF ANAL MASS;  Surgeon: Leighton Ruff, MD;  Location: [place];  Service: General;  Laterality: N/A;  . Laparoscopic assisted abdominal perineal resection N/A 04/22/2014    Procedure: LAPAROSCOPIC  ABDOMINAL PERINEAL RESECTION, ;  Surgeon: Leighton Ruff, MD;  Location: [facility];  Service: General;  Laterality: N/A;  . Urethrotomy Bilateral 04/22/2014    Procedure: CYSTOSCOPY/URETHROTOMY;  Surgeon: Leighton Ruff, MD;  Location: [facility];  Service: General;  Laterality: Bilateral;  . Muscle flap closure N/A 04/22/2014     Procedure: VRAM FLAP;  Surgeon: Irene Limbo, MD;  Location: [facility];  Service: Plastics;  Laterality: N/A;  . Portacath placement Left 06/24/2014    Procedure: INSERTION PORT-A-CATH LEFT SUBCLAVIAN;  Surgeon: Leighton Ruff, MD;  Location: [facility];  Service: General;  Laterality: Left;    SOCIAL HISTORY: Social History   Social History  . Marital Status: Single    Spouse Name: N/A  . Number of Children: 0  . Years of Education: N/A   Occupational History  .  [place] History Main Topics  . Smoking status: Never Smoker   . Smokeless tobacco: Never Used  . Alcohol Use: No     Comment: RARE  . Drug Use: No  . Sexual Activity: Not on file   Other Topics Concern  . Not on file   Social History Narrative   Full time. Single. Does not regularly exercise.    Lives alone, his mother lives close by  FAMILY HISTORY: Family History  Problem Relation Age of Onset  . Diabetes type II Mother   . Breast cancer Mother 38     survivor  . Hypertension Mother   . Coronary artery disease Father   Paternal GM had utrine cancer, no other history of malignancy   ALLERGIES:  is allergic to sulfa antibiotics.  MEDICATIONS:  Current Outpatient Prescriptions  Medication Sig Dispense Refill  . aspirin (ASPIR-81) 81 MG EC tablet Take 81 mg by mouth at bedtime.     Marland Kitchen atorvastatin (LIPITOR) 80 MG tablet Take 1 tablet (80 mg total) by mouth every evening. 90 tablet 4  . efavirenz-emtrictabine-tenofovir (ATRIPLA) 600-200-300 MG per tablet Take 1 tablet by mouth at bedtime.     . Empagliflozin-Linagliptin (GLYXAMBI) 25-5 MG TABS Take 1 tablet by mouth daily.    Marland Kitchen gabapentin (NEURONTIN) 300 MG capsule Take 1 capsule (300 mg total) by mouth 3 (three) times daily. 30 capsule 2  . glimepiride (AMARYL) 2 MG tablet Take 4 mg by mouth daily with breakfast.     . HYDROcodone-acetaminophen (NORCO/VICODIN) 5-325 MG tablet Take 1 tablet by mouth every 8 (eight) hours as needed for  moderate pain. 60 tablet 0  . losartan (COZAAR) 100 MG tablet Take 1 tablet (100 mg total) by mouth daily. 90 tablet 4  . metFORMIN (GLUCOPHAGE) 1000 MG tablet Take 1,000 mg by mouth 2 (two) times daily.      . ondansetron (ZOFRAN) 8 MG tablet Take 1 tablet (8 mg total) by mouth 2 (two) times daily as needed for nausea or vomiting. Take 8 mg by mouth 2 times daily for 3 days and as needed for nausea -  Start on third  day after chemo . 30 tablet 1  . oxyCODONE-acetaminophen (PERCOCET/ROXICET) 5-325 MG tablet Take 1 tablet by mouth every 8 (eight) hours as needed for moderate pain or severe pain. 30 tablet 0  . prochlorperazine (COMPAZINE) 10 MG tablet Take 1 tablet (10 mg total) by mouth every 6 (six) hours as needed for nausea or vomiting. 30 tablet 1  . dexamethasone (DECADRON) 4 MG tablet Take 1 tablet (4 mg total) by mouth daily. (Patient not taking: Reported on 11/17/2015) 10 tablet 1  . HYDROcodone-homatropine (HYCODAN) 5-1.5 MG/5ML syrup Take 5 mLs by mouth every 6 (six) hours as needed for cough. (Patient not taking: Reported on 11/17/2015) 120 mL 0   No current facility-administered medications for this visit.   Facility-Administered Medications Ordered in Other Visits  Medication Dose Route Frequency Provider Last Rate Last Dose  . heparin lock flush 100 unit/mL  500 Units Intravenous Once Truitt Merle, MD      . sodium chloride 0.9 % injection 10 mL  10 mL Intravenous PRN Truitt Merle, MD      . sodium chloride 0.9 % injection 10 mL  10 mL Intravenous PRN Truitt Merle, MD   10 mL at 11/17/15 0757    REVIEW OF SYSTEMS:   Constitutional: Denies fevers, chills or abnormal night sweats Eyes: Denies blurriness of vision, double vision or watery eyes Ears, nose, mouth, throat, and face: Denies mucositis or sore throat Respiratory: Denies cough, dyspnea or wheezes Cardiovascular: Denies palpitation, chest discomfort or lower extremity swelling Gastrointestinal:  Denies nausea, heartburn or change in  bowel habits, (+) colostomy bag Skin: Mild skin pigmentation from his previous rash. Lymphatics: Denies new lymphadenopathy or easy bruising Neurological:Denies numbness, tingling or new weaknesses Behavioral/Psych: Mood is stable, no new changes  All other systems were reviewed  with the patient and are negative.  PHYSICAL EXAMINATION: ECOG PERFORMANCE STATUS: 3 BP 121/76 mmHg  Pulse 106  Temp(Src) 97.8 F (36.6 C) (Oral)  Resp 18  Ht 6' (1.829 m)  Wt 180 lb 9.6 oz (81.92 kg)  BMI 24.49 kg/m2  SpO2 98%  GENERAL:alert, no distress, sits in a wheelchair comfortably SKIN: skin color, texture, turgor are normal, healed rashes on her arms, legs, and trunk with skin pigmentation.  EYES: normal, conjunctiva are pink and non-injected, sclera clear OROPHARYNX:no exudate, no erythema and lips, buccal mucosa, and tongue normal  NECK: supple, thyroid normal size, non-tender, without nodularity LYMPH:  no palpable lymphadenopathy in the cervical, axillary or inguinal LUNGS: clear to auscultation and percussion with normal breathing effort HEART: regular rate & rhythm and no murmurs and no lower extremity edema ABDOMEN:abdomen soft, non-tender and normal bowel sounds. (+) Colostomy bag. The perianal reconstruction site is completely healed.  Musculoskeletal:no cyanosis of digits and no clubbing  PSYCH: alert & oriented x 3 with fluent speech NEURO: no focal motor/sensory deficits SKIN: A few skin pigmentation from his previous rash  LABORATORY DATA:  I have reviewed the data as listed CBC Latest Ref Rng 11/17/2015 11/03/2015 10/27/2015  WBC 4.0 - 10.3 10e3/uL 3.9(L) 2.8(L) 5.8  Hemoglobin 13.0 - 17.1 g/dL 11.5(L) 10.5(L) 12.6(L)  Hematocrit 38.4 - 49.9 % 34.4(L) 31.9(L) 36.9(L)  Platelets 140 - 400 10e3/uL 148 254 262    CMP Latest Ref Rng 11/17/2015 11/03/2015 10/27/2015  Glucose 70 - 140 mg/dl 179(H) 222(H) 243(H)  BUN 7.0 - 26.0 mg/dL 18.7 17.0 25.9  Creatinine 0.7 - 1.3 mg/dL 1.2 1.1 1.3   Sodium 136 - 145 mEq/L 138 134(L) 132(L)  Potassium 3.5 - 5.1 mEq/L 4.4 4.1 4.2  CO2 22 - 29 mEq/L 22 20(L) 23  Calcium 8.4 - 10.4 mg/dL 9.3 8.4 9.1  Total Protein 6.4 - 8.3 g/dL 7.6 6.6 7.4  Total Bilirubin 0.20 - 1.20 mg/dL 0.32 0.33 0.45  Alkaline Phos 40 - 150 U/L 239(H) 174(H) 193(H)  AST 5 - 34 U/L 30 32 36(H)  ALT 0 - 55 U/L 33 32 35      PATHOLOGY REPORT 04/22/2014 Diagnosis 1. Soft tissue, biopsy, right pelvic sidewall; r/o malignancy - FIBROADIPOSE TISSUE, NO EVIDENCE OF MALIGNANCY. 2. Colon, segmental resection for tumor, rectum,sigmoid and anus - RECURRENT INVASIVE POORLY DIFFERENTIATED SQUAMOUS CELL CARCINOMA, INVADING THROUGH THE MUSCULARIS PROPRIA, INTO PERICOLONIC FATTY TISSUE, EXTENDING TO THE DEEP INKED MARGIN. - PERINEURAL INVASION AND ANGIOLYMPHATIC PRESENT. - SEVEN OF SEVENTEEN LYMPH NODES, POSITIVE FOR METASTATIC CARCINOMA (7/17) Microscopic Comment 2. Anus Specimen: Anus, rectum, sigmoid Procedure: Segmental resection Tumor site: Distal ends of specimen Specimen integrity: Intact Invasive tumor: Maximum size: At least 2.5 cm Histologic type(s): Invasive squamous cell carcinoma Histologic grade and differentiation: G2-3, moderately to poorly differentiated Microscopic extension of invasive tumor: Invading through the muscularis propria into pericolonic fatty tissue involving adjacent skeletal muscle tissue Lymph-Vascular invasion: Present Peri-neural invasion: Present Resection margins: Deep margin is positive for tumor Treatment effect (neo-adjuvant therapy): Present, minimally Lymph nodes: number examined 17; number positive: 7 Pathologic Staging: ypT2, yN1 (R1) Ancillary studies: N/A  Diagnosis 03/15/2015 Bone, biopsy, caudal left side of the sacrum - METASTATIC SQUAMOUS CELL CARCINOMA, SEE COMMENT. Microscopic Comment Biopsies demonstrate extensive involvement by metastatic focally keratinizing, well to moderately differentiated squamous cell  carcinoma. The previous anorectal resection demonstrating invasive squamous cell carcinoma is noted [id number]). (CRR:ecj 03/16/2015)  RADIOGRAPHIC STUDIES: I have personally reviewed the radiological images as listed and  agreed with the findings in the report.  CT chest PE protocol 10/12/2015 IMPRESSION: 1. No evidence of acute pulmonary embolism. 2. 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 An atypical infection is also a consideration. 3. Calcified gallstones within the gallbladder. 4. Coronary artery calcifications.   ASSESSMENT & PLAN:  55 years old male with past medical history of anal squamous cell carcinoma, status post surgical resection and concurrent chemoradiation in 2009, now has local recurrence status post APR and  reconstruction.ypT2, yN1 (R1), unfortunately his deep surgical margin was positive, and 7 out of 17 lymph nodes were positive also. No distant metastasis on the preop PET scan. His medical history is also complicated by diabetes, coronary artery disease status post stent placement, and HIV on treatment.   1. Local recurrence of anal squamous cell carcinoma, with positive surgical margines, biopsy proven sacral bone mets on 03/15/2015 -His CT chest was reviewed with patient last week, which showed bilateral lung metastasis -He knows this is terminal and incurable, his prognosis is extremely poor, giving he has had multiple lines of therapy. -He would like to try more chemotherapy, although we know the response rate likely going to be low. -He started weekly carbo and Taxol, tolerated well last week -We had a frank and lengthy discussion about his overall poor prognosis, I recommended him to consider hospice service, vs continue chemotherapy. He decided to try chemotherapy for now. He has moved to his sister's house with better supporting. -he has improved  significantly since he started chemotherapy, off oxygen most of time, cough also improved, performance status much improved -Lab results reviewed with patient, adequate for treatment, we'll proceed chemotherapy today -we'll repeat a CT scan after this cycle chemotherapy  2. Cough, dyspnea and hypoxia -Secondary to his underlying lung metastasis, uch improved since he started chemotherapy -He has tried a course of azithromycin, which did not help -He will continue using oxygen, and cough medicine as needed.  3. Sacral pain -secondary to his sacral bone metastasis, status post palliative radiation -He will continue oxycodone or hydrocodone as needed for pain. -He tried tramadol before, it did not work well -I suggest him to use Tylenol as needed.   4. Diabetes -controlled recently  -continue metformin and glimepiride, follow up with his endocrinologist   5. HIV  -Well controlled. Continue his treatment . His recent CD4 count was normal.   6. Hypertension and coronary artery disease  -He will continue follow-up with his primary care physician and cardiologist. Continue medications.   7. depression -worse since disease progression, not suicidal.  8. CODE STATUS -Has a long conversation about goals of care and CODE STATUS. Patient is agreeable with DO NOT RESUSCITATE and DO NOT INTUBATE -I encouraged him to call us if he has worsening short of breast, and we can see him in our symptom management clinic, instead of going to emergency room.   Plan -lab reviewed, will proceed C2D1 carboplatin and Taxol today.  -I'll see him back in two week before C2D15 treatment  -repeat CT in 3 weeks   All questions were answered. The patient knows to call the clinic with any problems, questions or concerns.   I spent 25 minutes counseling the patient face to face. The total time spent in the appointment was 40 minutes and more than 50% was on counseling.     Truitt Merle, MD 11/17/2015

## 2015-11-17 NOTE — Telephone Encounter (Signed)
per pof to sch pt appt-sent MW emailto sch trmt-pt to getupdated copy b4 leaving** °

## 2015-11-18 ENCOUNTER — Encounter: Payer: Self-pay | Admitting: Hematology

## 2015-11-18 NOTE — Progress Notes (Signed)
Let patient know mailed to him--forms to [place] address he left

## 2015-11-18 NOTE — Progress Notes (Signed)
folder left in my box 11/16/15- note patient wants mailed copy to him- left for dr. Burr Medico to sign

## 2015-11-23 ENCOUNTER — Encounter: Payer: Self-pay | Admitting: Hematology

## 2015-11-23 NOTE — Progress Notes (Signed)
Sent to medical records   See prev notes.fmla forms

## 2015-11-24 ENCOUNTER — Other Ambulatory Visit (HOSPITAL_BASED_OUTPATIENT_CLINIC_OR_DEPARTMENT_OTHER): Payer: Managed Care, Other (non HMO)

## 2015-11-24 ENCOUNTER — Ambulatory Visit: Payer: Managed Care, Other (non HMO)

## 2015-11-24 ENCOUNTER — Encounter: Payer: Managed Care, Other (non HMO) | Admitting: Physical Therapy

## 2015-11-24 ENCOUNTER — Ambulatory Visit (HOSPITAL_BASED_OUTPATIENT_CLINIC_OR_DEPARTMENT_OTHER): Payer: Managed Care, Other (non HMO)

## 2015-11-24 ENCOUNTER — Telehealth: Payer: Self-pay | Admitting: *Deleted

## 2015-11-24 VITALS — BP 113/71 | HR 87 | Temp 98.3°F | Resp 20

## 2015-11-24 DIAGNOSIS — C210 Malignant neoplasm of anus, unspecified: Secondary | ICD-10-CM

## 2015-11-24 DIAGNOSIS — C7951 Secondary malignant neoplasm of bone: Secondary | ICD-10-CM

## 2015-11-24 DIAGNOSIS — C211 Malignant neoplasm of anal canal: Secondary | ICD-10-CM

## 2015-11-24 DIAGNOSIS — Z5111 Encounter for antineoplastic chemotherapy: Secondary | ICD-10-CM

## 2015-11-24 LAB — COMPREHENSIVE METABOLIC PANEL
ALT: 33 U/L (ref 0–55)
AST: 29 U/L (ref 5–34)
Albumin: 3.4 g/dL — ABNORMAL LOW (ref 3.5–5.0)
Alkaline Phosphatase: 185 U/L — ABNORMAL HIGH (ref 40–150)
Anion Gap: 9 mEq/L (ref 3–11)
BILIRUBIN TOTAL: 0.3 mg/dL (ref 0.20–1.20)
BUN: 18.2 mg/dL (ref 7.0–26.0)
CHLORIDE: 105 meq/L (ref 98–109)
CO2: 22 meq/L (ref 22–29)
CREATININE: 1 mg/dL (ref 0.7–1.3)
Calcium: 9.4 mg/dL (ref 8.4–10.4)
EGFR: 80 mL/min/{1.73_m2} — ABNORMAL LOW (ref >90)
GLUCOSE: 136 mg/dL (ref 70–140)
Potassium: 4.4 mEq/L (ref 3.5–5.1)
SODIUM: 137 meq/L (ref 136–145)
TOTAL PROTEIN: 7.5 g/dL (ref 6.4–8.3)

## 2015-11-24 LAB — CBC WITH DIFFERENTIAL/PLATELET
BASO%: 1.3 % (ref 0.0–2.0)
Basophils Absolute: 0 10*3/uL (ref 0.0–0.1)
EOS%: 3 % (ref 0.0–7.0)
Eosinophils Absolute: 0.1 10*3/uL (ref 0.0–0.5)
HCT: 32.7 % — ABNORMAL LOW (ref 38.4–49.9)
HGB: 10.8 g/dL — ABNORMAL LOW (ref 13.0–17.1)
LYMPH%: 21.7 % (ref 14.0–49.0)
MCH: 29.8 pg (ref 27.2–33.4)
MCHC: 33 g/dL (ref 32.0–36.0)
MCV: 90.2 fL (ref 79.3–98.0)
MONO#: 0.2 10*3/uL (ref 0.1–0.9)
MONO%: 8 % (ref 0.0–14.0)
NEUT%: 66 % (ref 39.0–75.0)
NEUTROS ABS: 1.8 10*3/uL (ref 1.5–6.5)
PLATELETS: 166 10*3/uL (ref 140–400)
RBC: 3.62 10*6/uL — AB (ref 4.20–5.82)
RDW: 19 % — ABNORMAL HIGH (ref 11.0–14.6)
WBC: 2.7 10*3/uL — AB (ref 4.0–10.3)
lymph#: 0.6 10*3/uL — ABNORMAL LOW (ref 0.9–3.3)

## 2015-11-24 MED ORDER — SODIUM CHLORIDE 0.9 % IV SOLN
20.0000 mg | Freq: Once | INTRAVENOUS | Status: AC
  Administered 2015-11-24: 20 mg via INTRAVENOUS
  Filled 2015-11-24: qty 2

## 2015-11-24 MED ORDER — HEPARIN SOD (PORK) LOCK FLUSH 100 UNIT/ML IV SOLN
500.0000 [IU] | Freq: Once | INTRAVENOUS | Status: AC | PRN
  Administered 2015-11-24: 500 [IU]
  Filled 2015-11-24: qty 5

## 2015-11-24 MED ORDER — SODIUM CHLORIDE 0.9 % IJ SOLN
10.0000 mL | INTRAMUSCULAR | Status: DC | PRN
  Administered 2015-11-24: 10 mL via INTRAVENOUS
  Filled 2015-11-24: qty 10

## 2015-11-24 MED ORDER — DIPHENHYDRAMINE HCL 50 MG/ML IJ SOLN
50.0000 mg | Freq: Once | INTRAMUSCULAR | Status: AC
  Administered 2015-11-24: 50 mg via INTRAVENOUS

## 2015-11-24 MED ORDER — PALONOSETRON HCL INJECTION 0.25 MG/5ML
INTRAVENOUS | Status: AC
Start: 2015-11-24 — End: 2015-11-24
  Filled 2015-11-24: qty 5

## 2015-11-24 MED ORDER — FAMOTIDINE IN NACL 20-0.9 MG/50ML-% IV SOLN
20.0000 mg | Freq: Once | INTRAVENOUS | Status: AC
  Administered 2015-11-24: 20 mg via INTRAVENOUS

## 2015-11-24 MED ORDER — PALONOSETRON HCL INJECTION 0.25 MG/5ML
0.2500 mg | Freq: Once | INTRAVENOUS | Status: AC
  Administered 2015-11-24: 0.25 mg via INTRAVENOUS

## 2015-11-24 MED ORDER — SODIUM CHLORIDE 0.9 % IV SOLN
Freq: Once | INTRAVENOUS | Status: AC
  Administered 2015-11-24: 09:00:00 via INTRAVENOUS

## 2015-11-24 MED ORDER — FAMOTIDINE IN NACL 20-0.9 MG/50ML-% IV SOLN
INTRAVENOUS | Status: AC
  Filled 2015-11-24: qty 50

## 2015-11-24 MED ORDER — SODIUM CHLORIDE 0.9 % IV SOLN
253.2000 mg | Freq: Once | INTRAVENOUS | Status: AC
  Administered 2015-11-24: 250 mg via INTRAVENOUS
  Filled 2015-11-24: qty 25

## 2015-11-24 MED ORDER — SODIUM CHLORIDE 0.9% FLUSH
10.0000 mL | INTRAVENOUS | Status: DC | PRN
  Administered 2015-11-24: 10 mL
  Filled 2015-11-24: qty 10

## 2015-11-24 MED ORDER — DIPHENHYDRAMINE HCL 50 MG/ML IJ SOLN
INTRAMUSCULAR | Status: AC
  Filled 2015-11-24: qty 1

## 2015-11-24 MED ORDER — SODIUM CHLORIDE 0.9 % IV SOLN
80.0000 mg/m2 | Freq: Once | INTRAVENOUS | Status: AC
  Administered 2015-11-24: 168 mg via INTRAVENOUS
  Filled 2015-11-24: qty 28

## 2015-11-24 NOTE — Patient Instructions (Signed)
[facility] Discharge Instructions for Patients Receiving Chemotherapy  Today you received the following chemotherapy agents Taxol and Carboplatin.  To help prevent nausea and vomiting after your treatment, we encourage you to take your nausea medication as prescribed.   If you develop nausea and vomiting that is not controlled by your nausea medication, call the clinic.   BELOW ARE SYMPTOMS THAT SHOULD BE REPORTED IMMEDIATELY:  *FEVER GREATER THAN 100.5 F  *CHILLS WITH OR WITHOUT FEVER  NAUSEA AND VOMITING THAT IS NOT CONTROLLED WITH YOUR NAUSEA MEDICATION  *UNUSUAL SHORTNESS OF BREATH  *UNUSUAL BRUISING OR BLEEDING  TENDERNESS IN MOUTH AND THROAT WITH OR WITHOUT PRESENCE OF ULCERS  *URINARY PROBLEMS  *BOWEL PROBLEMS  UNUSUAL RASH Items with * indicate a potential emergency and should be followed up as soon as possible.  Feel free to call the clinic you have any questions or concerns. The clinic phone number is [phone].  Please show the CHEMO ALERT CARD at check-in to the Emergency Department and triage nurse.   

## 2015-11-24 NOTE — Telephone Encounter (Signed)
Per chemo RN patient canceled appt for 6/5.

## 2015-11-28 ENCOUNTER — Encounter: Payer: Managed Care, Other (non HMO) | Admitting: Nutrition

## 2015-12-01 ENCOUNTER — Ambulatory Visit: Payer: Managed Care, Other (non HMO) | Admitting: Nutrition

## 2015-12-01 ENCOUNTER — Encounter: Payer: Managed Care, Other (non HMO) | Admitting: Physical Therapy

## 2015-12-01 ENCOUNTER — Ambulatory Visit (HOSPITAL_BASED_OUTPATIENT_CLINIC_OR_DEPARTMENT_OTHER): Payer: Managed Care, Other (non HMO)

## 2015-12-01 ENCOUNTER — Other Ambulatory Visit (HOSPITAL_BASED_OUTPATIENT_CLINIC_OR_DEPARTMENT_OTHER): Payer: Managed Care, Other (non HMO)

## 2015-12-01 ENCOUNTER — Encounter: Payer: Self-pay | Admitting: Hematology

## 2015-12-01 ENCOUNTER — Ambulatory Visit: Payer: Managed Care, Other (non HMO)

## 2015-12-01 ENCOUNTER — Ambulatory Visit (HOSPITAL_BASED_OUTPATIENT_CLINIC_OR_DEPARTMENT_OTHER): Payer: Managed Care, Other (non HMO) | Admitting: Hematology

## 2015-12-01 VITALS — BP 100/62 | HR 96 | Temp 98.5°F | Resp 18 | Ht 72.0 in | Wt 182.0 lb

## 2015-12-01 DIAGNOSIS — C211 Malignant neoplasm of anal canal: Secondary | ICD-10-CM

## 2015-12-01 DIAGNOSIS — Z5111 Encounter for antineoplastic chemotherapy: Secondary | ICD-10-CM | POA: Diagnosis not present

## 2015-12-01 DIAGNOSIS — C210 Malignant neoplasm of anus, unspecified: Secondary | ICD-10-CM

## 2015-12-01 DIAGNOSIS — G893 Neoplasm related pain (acute) (chronic): Secondary | ICD-10-CM

## 2015-12-01 DIAGNOSIS — C7951 Secondary malignant neoplasm of bone: Secondary | ICD-10-CM

## 2015-12-01 DIAGNOSIS — B20 Human immunodeficiency virus [HIV] disease: Secondary | ICD-10-CM

## 2015-12-01 DIAGNOSIS — D709 Neutropenia, unspecified: Secondary | ICD-10-CM

## 2015-12-01 DIAGNOSIS — F329 Major depressive disorder, single episode, unspecified: Secondary | ICD-10-CM

## 2015-12-01 LAB — CBC WITH DIFFERENTIAL/PLATELET
BASO%: 1.1 % (ref 0.0–2.0)
Basophils Absolute: 0 10*3/uL (ref 0.0–0.1)
EOS%: 3.1 % (ref 0.0–7.0)
Eosinophils Absolute: 0 10*3/uL (ref 0.0–0.5)
HEMATOCRIT: 29.6 % — AB (ref 38.4–49.9)
HGB: 10 g/dL — ABNORMAL LOW (ref 13.0–17.1)
LYMPH#: 0.6 10*3/uL — AB (ref 0.9–3.3)
LYMPH%: 40.3 % (ref 14.0–49.0)
MCH: 30.3 pg (ref 27.2–33.4)
MCHC: 33.7 g/dL (ref 32.0–36.0)
MCV: 90 fL (ref 79.3–98.0)
MONO#: 0.1 10*3/uL (ref 0.1–0.9)
MONO%: 10.2 % (ref 0.0–14.0)
NEUT%: 45.3 % (ref 39.0–75.0)
NEUTROS ABS: 0.6 10*3/uL — AB (ref 1.5–6.5)
PLATELETS: 243 10*3/uL (ref 140–400)
RBC: 3.29 10*6/uL — AB (ref 4.20–5.82)
RDW: 18.8 % — ABNORMAL HIGH (ref 11.0–14.6)
WBC: 1.4 10*3/uL — AB (ref 4.0–10.3)

## 2015-12-01 LAB — COMPREHENSIVE METABOLIC PANEL
ALT: 29 U/L (ref 0–55)
ANION GAP: 8 meq/L (ref 3–11)
AST: 27 U/L (ref 5–34)
Albumin: 3.4 g/dL — ABNORMAL LOW (ref 3.5–5.0)
Alkaline Phosphatase: 152 U/L — ABNORMAL HIGH (ref 40–150)
BUN: 20.4 mg/dL (ref 7.0–26.0)
CALCIUM: 8.8 mg/dL (ref 8.4–10.4)
CHLORIDE: 104 meq/L (ref 98–109)
CO2: 24 meq/L (ref 22–29)
CREATININE: 1 mg/dL (ref 0.7–1.3)
EGFR: 80 mL/min/{1.73_m2} — ABNORMAL LOW (ref >90)
Glucose: 137 mg/dl (ref 70–140)
Potassium: 4.3 mEq/L (ref 3.5–5.1)
Sodium: 136 mEq/L (ref 136–145)
TOTAL PROTEIN: 7.3 g/dL (ref 6.4–8.3)

## 2015-12-01 MED ORDER — FAMOTIDINE IN NACL 20-0.9 MG/50ML-% IV SOLN
INTRAVENOUS | Status: AC
  Filled 2015-12-01: qty 50

## 2015-12-01 MED ORDER — DIPHENHYDRAMINE HCL 50 MG/ML IJ SOLN
50.0000 mg | Freq: Once | INTRAMUSCULAR | Status: AC
  Administered 2015-12-01: 50 mg via INTRAVENOUS

## 2015-12-01 MED ORDER — SODIUM CHLORIDE 0.9 % IJ SOLN
10.0000 mL | INTRAMUSCULAR | Status: DC | PRN
  Administered 2015-12-01: 10 mL via INTRAVENOUS
  Filled 2015-12-01: qty 10

## 2015-12-01 MED ORDER — SODIUM CHLORIDE 0.9% FLUSH
10.0000 mL | INTRAVENOUS | Status: DC | PRN
  Administered 2015-12-01: 10 mL
  Filled 2015-12-01: qty 10

## 2015-12-01 MED ORDER — TRAZODONE HCL 100 MG PO TABS: 100.0000 mg | ORAL_TABLET | Freq: Every day | ORAL | Status: DC

## 2015-12-01 MED ORDER — SODIUM CHLORIDE 0.9 % IV SOLN
Freq: Once | INTRAVENOUS | Status: AC
  Administered 2015-12-01: 13:00:00 via INTRAVENOUS

## 2015-12-01 MED ORDER — SODIUM CHLORIDE 0.9 % IV SOLN
20.0000 mg | Freq: Once | INTRAVENOUS | Status: AC
  Administered 2015-12-01: 20 mg via INTRAVENOUS
  Filled 2015-12-01: qty 2

## 2015-12-01 MED ORDER — PALONOSETRON HCL INJECTION 0.25 MG/5ML
0.2500 mg | Freq: Once | INTRAVENOUS | Status: AC
  Administered 2015-12-01: 0.25 mg via INTRAVENOUS

## 2015-12-01 MED ORDER — FAMOTIDINE IN NACL 20-0.9 MG/50ML-% IV SOLN
20.0000 mg | Freq: Once | INTRAVENOUS | Status: AC
  Administered 2015-12-01: 20 mg via INTRAVENOUS

## 2015-12-01 MED ORDER — CARBOPLATIN CHEMO INJECTION 450 MG/45ML
190.0000 mg | Freq: Once | INTRAVENOUS | Status: AC
  Administered 2015-12-01: 190 mg via INTRAVENOUS
  Filled 2015-12-01: qty 19

## 2015-12-01 MED ORDER — GABAPENTIN 100 MG PO CAPS: 100.0000 mg | ORAL_CAPSULE | Freq: Every day | ORAL | Status: DC

## 2015-12-01 MED ORDER — HEPARIN SOD (PORK) LOCK FLUSH 100 UNIT/ML IV SOLN
500.0000 [IU] | Freq: Once | INTRAVENOUS | Status: AC | PRN
  Administered 2015-12-01: 500 [IU]
  Filled 2015-12-01: qty 5

## 2015-12-01 MED ORDER — SODIUM CHLORIDE 0.9 % IV SOLN
60.0000 mg/m2 | Freq: Once | INTRAVENOUS | Status: AC
  Administered 2015-12-01: 126 mg via INTRAVENOUS
  Filled 2015-12-01: qty 21

## 2015-12-01 MED ORDER — DIPHENHYDRAMINE HCL 50 MG/ML IJ SOLN
INTRAMUSCULAR | Status: AC
  Filled 2015-12-01: qty 1

## 2015-12-01 MED ORDER — PALONOSETRON HCL INJECTION 0.25 MG/5ML
INTRAVENOUS | Status: AC
  Filled 2015-12-01: qty 5

## 2015-12-01 NOTE — Patient Instructions (Signed)
[facility] Discharge Instructions for Patients Receiving Chemotherapy  Today you received the following chemotherapy agents Taxol and Carboplatin.  To help prevent nausea and vomiting after your treatment, we encourage you to take your nausea medication as prescribed.   If you develop nausea and vomiting that is not controlled by your nausea medication, call the clinic.   BELOW ARE SYMPTOMS THAT SHOULD BE REPORTED IMMEDIATELY:  *FEVER GREATER THAN 100.5 F  *CHILLS WITH OR WITHOUT FEVER  NAUSEA AND VOMITING THAT IS NOT CONTROLLED WITH YOUR NAUSEA MEDICATION  *UNUSUAL SHORTNESS OF BREATH  *UNUSUAL BRUISING OR BLEEDING  TENDERNESS IN MOUTH AND THROAT WITH OR WITHOUT PRESENCE OF ULCERS  *URINARY PROBLEMS  *BOWEL PROBLEMS  UNUSUAL RASH Items with * indicate a potential emergency and should be followed up as soon as possible.  Feel free to call the clinic you have any questions or concerns. The clinic phone number is [phone].  Please show the CHEMO ALERT CARD at check-in to the Emergency Department and triage nurse.   

## 2015-12-01 NOTE — Progress Notes (Signed)
OK to treat despite low ANC per Dr Burr Medico.  Repeated & verified.

## 2015-12-01 NOTE — Progress Notes (Signed)
Nutrition follow-up completed with patient who is familiar from past visits. Patient is 55 year old male with recurrent anal cancer. Current weight documented as 182 pounds June 8 decreased from 210 pounds February 2017. BMI within normal range of 24.68. Patient denies nausea, vomiting, constipation or diarrhea. Reports he is consuming adequate fluids. Patient will drink boost every now and then.  He is trying to eat small meals.  Nutrition diagnosis: Unintended weight loss continues.  Intervention:  Provided support and encouragement for patient to continue strategies for increased calories and protein. Provided coupons for boost. Questions were answered.  Teach back method used.  Monitoring, evaluation, goals: Patient will tolerate increased calories and protein to promote weight maintenance of lean body mass.  Next visit: To be scheduled as needed.  **Disclaimer: This note was dictated with voice recognition software. Similar sounding words can inadvertently be transcribed and this note may contain transcription errors which may not have been corrected upon publication of note.**

## 2015-12-01 NOTE — Patient Instructions (Signed)

## 2015-12-01 NOTE — Progress Notes (Signed)
[place] NOTE  Patient Care Team: Leonel Ramsay, MD as PCP - General (Infectious Diseases) Minna Merritts, MD as Consulting Physician (Cardiology) Robert Bellow, MD (General Surgery) Leighton Ruff, MD as Consulting Physician (General Surgery) Irene Limbo, MD as Consulting Physician (Plastic Surgery) Truitt Merle, MD as Consulting Physician (Hematology) Festus Aloe, MD as Consulting Physician (Urology) Gery Pray, MD as Consulting Physician (Radiation Oncology)  DIAGNOSIS Follow-up recurrent anal cancer    Primary cancer of anal canal (Julian Palmer)   02/05/2011 Initial Diagnosis Primary cancer of anal canal, T1N0M0, s/p surgical resection with positive margines. He received definitive dose RT 45 Gy over 5 weeks with 16Gy boosting and concurrent chemo with 5FU, treated at [facility] by Drs.  Geoffreyh and [employer].    11/23/2013 Relapse/Recurrence local recurrence    04/21/2014 Surgery abdominal perianal resection (APR) and VRAM flap closure of perineal wound on 04/21/14, surgical margins were positive for cancer    06/28/2014 - 09/25/2014 Chemotherapy 5-FU 1000mg /m2/day on D1-5, ciplatin 100mg /m2 on D2, every 28 days, s/p 4 cycles    10/11/2014 Progression CT CAP showed new liver lesions and abd/pelc adenopathy, highly suspecious for progressive metastatic disease    11/02/2014 Imaging abdomen MRI w wo contrast showed new small lesions in the right hepatic lobe with nonspecific enhancement, and enlarging nodes in the porta hepatis, metastatic disease not excluded    02/24/2015 Imaging PET scan showed hypermetabolic bone mets in right sacrum, and small hypermetabolic retroperitoneal lymph nodes, no other abnormal metabolic activity within the liver or other place.   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   04/15/2015 - 08/18/2015 Chemotherapy Nivolumab 240 mg every 2 weeks, stopped due to disease  progression    07/07/2015 Imaging PET scan showed interval progression of lytic metastasis involving the righ side of sacrum, pathologic fracture involving the right sacral wing. Improvement in retroperitoneal lymph node metastasis. 7 mm nodule in the left upper lobe lung, indeterminate.   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   10/20/2015 -  Chemotherapy Weekly carboplatin AUC 2, Taxol 80 mg/m, 3 weeks on, one-week off     OTHER RELATED ISSUES: 1. (+) HIV, on therapy with good control  2. CAD, s/p stent placement in [place]: Weekly carboplatin AUC 2, Taxol 80 mg/m, 3 weeks on, one-week off, started on 10/20/2015  INTERIM HISTORY: Murilo returns for follow up and C2D15 chemo. He is doing well overall. He has been off oxygen completely. He moved back to his own house about a week ago, able to live independently. He still has moderate fatigue, but able to go up and down stairs. He noticed numbness and tingling on his toes, which has been persistent since about 2 weeks ago, no gait disturbance or fall, he noticed it most at night which affects his sleep. No significant neuropathy on his hands or other neuro symptoms. He has decided to retire from his job.   MEDICAL HISTORY:  Past Medical History  Diagnosis Date  . S/p bare metal coronary artery stent     1998  pLAD  . Hypertension   . History of rectal cancer     04/ 2012--  invasive squamous cell carcinoma at 9 o'clock position, poorly differentiated s/p  resection and  30 cylces radiation / chemo therapy for 2 weeks  . Hyperlipidemia   . History of carcinoma in situ of anal canal     2010  . ED (erectile dysfunction)   . Sigmoid diverticulosis   . PONV (postoperative  nausea and vomiting)   . Wears glasses   . At risk for sleep apnea     STOP-BANG= 4     SENT TO PCP 03-23-2014  . Coronary artery disease CARDIOLOGIST--  DR Rockey Situ --  LAST LOV 04-17-2013    S/P  PCI to mid and distal LAD and BM stent x1 to pLAD  . Anxiety   . Depression   . HIV positive ([place])   . Type 2 diabetes mellitus ([place])   . Radiation 01/2011    definitive dose RT 45 Gy over 5 weeks with 16Gy boosting  . 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 Primary cancer of anal canal ([place]) 05/18/2011    SURGICAL HISTORY: Past Surgical History  Procedure Laterality Date  . Septoplasty  1985  . Sphincterotomy  2009  . Cardiovascular stress test  04/ 2010    low risk study/  very small region of mildly reduced perfersion is mildly reversible dLAD territory  . Resection anal mass  02-05-2011  . Excision anal polyp  2009  . Re-excision anal squamous cell carcinoma  2010  . Removal rectal mass  01-08-2014  . Eua w/ bx perianal skin  06-08-2009  . Port-a-cath placement  02-21-2011    REMOVED  2013  . Coronary angioplasty with stent placement  1998   [facility]    BM stent to pLAD and PCI to Mid and Distal LAD/  no other significant cad  . Inguinal hernia repair Bilateral 1988  . Colonoscopy with propofol  05-28-2008  . Mass excision N/A 03/26/2014    Procedure: ANAL EXAM UNDER ANESTHESIA,EXCISIONAL BIOPSY OF ANAL MASS;  Surgeon: Leighton Ruff, MD;  Location: [facility];  Service: General;  Laterality: N/A;  . Laparoscopic assisted abdominal perineal resection N/A 04/22/2014    Procedure: LAPAROSCOPIC  ABDOMINAL PERINEAL RESECTION, ;  Surgeon: Leighton Ruff, MD;  Location: [facility];  Service: General;  Laterality: N/A;  . Urethrotomy Bilateral 04/22/2014    Procedure: CYSTOSCOPY/URETHROTOMY;  Surgeon: Leighton Ruff, MD;  Location: [facility];  Service: General;  Laterality: Bilateral;  . Muscle flap closure N/A 04/22/2014    Procedure: VRAM FLAP;   Surgeon: Irene Limbo, MD;  Location: [facility];  Service: Plastics;  Laterality: N/A;  . Portacath placement Left 06/24/2014    Procedure: INSERTION PORT-A-CATH LEFT SUBCLAVIAN;  Surgeon: Leighton Ruff, MD;  Location: [facility];  Service: General;  Laterality: Left;    SOCIAL HISTORY: Social History   Social History  . Marital Status: Single    Spouse Name: N/A  . Number of Children: 0  . Years of Education: N/A   Occupational History  .  [place] History Main Topics  . Smoking status: Never Smoker   . Smokeless tobacco: Never Used  . Alcohol Use: No     Comment: RARE  . Drug Use: No  . Sexual Activity: Not on file   Other Topics Concern  . Not on file   Social History Narrative   Full time. Single. Does not regularly exercise.    Lives alone, his mother lives close by    FAMILY HISTORY: Family History  Problem Relation Age  of Onset  . Diabetes type II Mother   . Breast cancer Mother 73     survivor  . Hypertension Mother   . Coronary artery disease Father   Paternal GM had utrine cancer, no other history of malignancy   ALLERGIES:  is allergic to sulfa antibiotics.  MEDICATIONS:  Current Outpatient Prescriptions  Medication Sig Dispense Refill  . aspirin (ASPIR-81) 81 MG EC tablet Take 81 mg by mouth at bedtime.     Marland Kitchen atorvastatin (LIPITOR) 80 MG tablet Take 1 tablet (80 mg total) by mouth every evening. 90 tablet 4  . efavirenz-emtrictabine-tenofovir (ATRIPLA) 600-200-300 MG per tablet Take 1 tablet by mouth at bedtime.     . Empagliflozin-Linagliptin (GLYXAMBI) 25-5 MG TABS Take 1 tablet by mouth daily.    Marland Kitchen glimepiride (AMARYL) 2 MG tablet Take 4 mg by mouth daily with breakfast.     . HYDROcodone-acetaminophen (NORCO/VICODIN) 5-325 MG tablet Take 1 tablet by mouth every 8 (eight) hours as needed for moderate pain. 60 tablet 0  . losartan (COZAAR) 100 MG tablet Take 1 tablet (100 mg total) by mouth daily. 90 tablet 4  . metFORMIN  (GLUCOPHAGE) 1000 MG tablet Take 1,000 mg by mouth 2 (two) times daily.      . ondansetron (ZOFRAN) 8 MG tablet Take 1 tablet (8 mg total) by mouth 2 (two) times daily as needed for nausea or vomiting. Take 8 mg by mouth 2 times daily for 3 days and as needed for nausea -  Start on third  day after chemo . 30 tablet 1  . prochlorperazine (COMPAZINE) 10 MG tablet Take 1 tablet (10 mg total) by mouth every 6 (six) hours as needed for nausea or vomiting. 30 tablet 1  . traZODone (DESYREL) 100 MG tablet Take 1 tablet (100 mg total) by mouth at bedtime. 60 tablet 1  . dexamethasone (DECADRON) 4 MG tablet Take 1 tablet (4 mg total) by mouth daily. (Patient not taking: Reported on 11/17/2015) 10 tablet 1  . gabapentin (NEURONTIN) 100 MG capsule Take 1 capsule (100 mg total) by mouth at bedtime. (Patient not taking: Reported on 12/01/2015) 90 capsule 1  . HYDROcodone-homatropine (HYCODAN) 5-1.5 MG/5ML syrup Take 5 mLs by mouth every 6 (six) hours as needed for cough. (Patient not taking: Reported on 11/17/2015) 120 mL 0   No current facility-administered medications for this visit.   Facility-Administered Medications Ordered in Other Visits  Medication Dose Route Frequency Provider Last Rate Last Dose  . heparin lock flush 100 unit/mL  500 Units Intravenous Once Truitt Merle, MD      . sodium chloride 0.9 % injection 10 mL  10 mL Intravenous PRN Truitt Merle, MD        REVIEW OF SYSTEMS:   Constitutional: Denies fevers, chills or abnormal night sweats Eyes: Denies blurriness of vision, double vision or watery eyes Ears, nose, mouth, throat, and face: Denies mucositis or sore throat Respiratory: Denies cough, dyspnea or wheezes Cardiovascular: Denies palpitation, chest discomfort or lower extremity swelling Gastrointestinal:  Denies nausea, heartburn or change in bowel habits, (+) colostomy bag Skin: Mild skin pigmentation from his previous rash. Lymphatics: Denies new lymphadenopathy or easy  bruising Neurological:Denies numbness, tingling or new weaknesses Behavioral/Psych: Mood is stable, no new changes  All other systems were reviewed with the patient and are negative.  PHYSICAL EXAMINATION: ECOG PERFORMANCE STATUS: 1 BP 100/62 mmHg  Pulse 96  Temp(Src) 98.5 F (36.9 C) (Oral)  Resp 18  Ht 6' (1.829 m)  Wt 182 lb (82.555 kg)  BMI 24.68 kg/m2  SpO2 100%  GENERAL:alert, no distress, sits in a wheelchair comfortably SKIN: skin color, texture, turgor are normal. A few skin pigmentation from his previous rash EYES: normal, conjunctiva are pink and non-injected, sclera clear OROPHARYNX:no exudate, no erythema and lips, buccal mucosa, and tongue normal  NECK: supple, thyroid normal size, non-tender, without nodularity LYMPH:  no palpable lymphadenopathy in the cervical, axillary or inguinal LUNGS: clear to auscultation and percussion with normal breathing effort HEART: regular rate & rhythm and no murmurs and no lower extremity edema ABDOMEN:abdomen soft, non-tender and normal bowel sounds. (+) Colostomy bag. The perianal reconstruction site is completely healed.  Musculoskeletal:no cyanosis of digits and no clubbing  PSYCH: alert & oriented x 3 with fluent speech NEURO: no focal motor/sensory deficits  LABORATORY DATA:  I have reviewed the data as listed CBC Latest Ref Rng 12/01/2015 11/24/2015 11/17/2015  WBC 4.0 - 10.3 10e3/uL 1.4(L) 2.7(L) 3.9(L)  Hemoglobin 13.0 - 17.1 g/dL 10.0(L) 10.8(L) 11.5(L)  Hematocrit 38.4 - 49.9 % 29.6(L) 32.7(L) 34.4(L)  Platelets 140 - 400 10e3/uL 243 166 148    CMP Latest Ref Rng 12/01/2015 11/24/2015 11/17/2015  Glucose 70 - 140 mg/dl 137 136 179(H)  BUN 7.0 - 26.0 mg/dL 20.4 18.2 18.7  Creatinine 0.7 - 1.3 mg/dL 1.0 1.0 1.2  Sodium 136 - 145 mEq/L 136 137 138  Potassium 3.5 - 5.1 mEq/L 4.3 4.4 4.4  CO2 22 - 29 mEq/L 24 22 22   Calcium 8.4 - 10.4 mg/dL 8.8 9.4 9.3  Total Protein 6.4 - 8.3 g/dL 7.3 7.5 7.6  Total Bilirubin 0.20 - 1.20 mg/dL  <0.30 0.30 0.32  Alkaline Phos 40 - 150 U/L 152(H) 185(H) 239(H)  AST 5 - 34 U/L 27 29 30   ALT 0 - 55 U/L 29 33 33   ANC 0.6 today   PATHOLOGY REPORT 04/22/2014 Diagnosis 1. Soft tissue, biopsy, right pelvic sidewall; r/o malignancy - FIBROADIPOSE TISSUE, NO EVIDENCE OF MALIGNANCY. 2. Colon, segmental resection for tumor, rectum,sigmoid and anus - RECURRENT INVASIVE POORLY DIFFERENTIATED SQUAMOUS CELL CARCINOMA, INVADING THROUGH THE MUSCULARIS PROPRIA, INTO PERICOLONIC FATTY TISSUE, EXTENDING TO THE DEEP INKED MARGIN. - PERINEURAL INVASION AND ANGIOLYMPHATIC PRESENT. - SEVEN OF SEVENTEEN LYMPH NODES, POSITIVE FOR METASTATIC CARCINOMA (7/17) Microscopic Comment 2. Anus Specimen: Anus, rectum, sigmoid Procedure: Segmental resection Tumor site: Distal ends of specimen Specimen integrity: Intact Invasive tumor: Maximum size: At least 2.5 cm Histologic type(s): Invasive squamous cell carcinoma Histologic grade and differentiation: G2-3, moderately to poorly differentiated Microscopic extension of invasive tumor: Invading through the muscularis propria into pericolonic fatty tissue involving adjacent skeletal muscle tissue Lymph-Vascular invasion: Present Peri-neural invasion: Present Resection margins: Deep margin is positive for tumor Treatment effect (neo-adjuvant therapy): Present, minimally Lymph nodes: number examined 17; number positive: 7 Pathologic Staging: ypT2, yN1 (R1) Ancillary studies: N/A  Diagnosis 03/15/2015 Bone, biopsy, caudal left side of the sacrum - METASTATIC SQUAMOUS CELL CARCINOMA, SEE COMMENT. Microscopic Comment Biopsies demonstrate extensive involvement by metastatic focally keratinizing, well to moderately differentiated squamous cell carcinoma. The previous anorectal resection demonstrating invasive squamous cell carcinoma is noted [id number]). (CRR:ecj 03/16/2015)  RADIOGRAPHIC STUDIES: I have personally reviewed the radiological images as listed  and agreed with the findings in the report.  CT chest PE protocol 10/12/2015 IMPRESSION: 1. No evidence of acute pulmonary embolism. 2. 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 An atypical infection is also a  consideration. 3. Calcified gallstones within the gallbladder. 4. Coronary artery calcifications.   ASSESSMENT & PLAN:  55 years old male with past medical history of anal squamous cell carcinoma, status post surgical resection and concurrent chemoradiation in 2009, now has local recurrence status post APR and  reconstruction.ypT2, yN1 (R1), unfortunately his deep surgical margin was positive, and 7 out of 17 lymph nodes were positive also. No distant metastasis on the preop PET scan. His medical history is also complicated by diabetes, coronary artery disease status post stent placement, and HIV on treatment.   1. Local recurrence of anal squamous cell carcinoma, with positive surgical margines, biopsy proven sacral bone mets on 03/15/2015, lung mets in 09/2015 -His CT chest in 09/2015 showed bilateral lung metastasis -He knows this is terminal and incurable, his prognosis is extremely poor, giving he has had multiple lines of therapy. -He started weekly carbo and Taxol, tolerated well so far, he has developed mild peripheral neuropathy -he has improved significantly since he started chemotherapy, off oxygen, cough also improved, performance status much improved -Lab results reviewed with patient, neutropenia with ANC 0.6, we'll proceed chemotherapy today with dose reduction. He will be off chemotherapy for the next 2 weeks -we'll repeat a CT scan after this cycle chemotherapy -Patient does not want coming every week for chemotherapy, I will consider change his Norma Fredrickson Taxol to every 3 weeks from next cycle.  2. Cough, dyspnea and hypoxia -Secondary to his underlying lung  metastasis, much improved since he started chemotherapy -He has tried a course of azithromycin, which did not help -He will continue using oxygen, and cough medicine as needed.  3. Peripheral neuropathy, G1 -Mainly in her feet, more noticeable at night -He is diabetic, has baseline mild neuropathy -I encouraged him to try Neurontin again, I called a Neurontin 100 mg at that time, titrate to 300 mg at bedtime if tolerates well  4. Sacral pain -secondary to his sacral bone metastasis, status post palliative radiation -He will continue oxycodone or hydrocodone as needed for pain. -He tried tramadol before, it did not work well -I suggest him to use Tylenol as needed.   5. Diabetes -controlled recently  -continue metformin and glimepiride, follow up with his endocrinologist   6. HIV  -Well controlled. Continue his treatment . His recent CD4 count was normal.   7. Hypertension and coronary artery disease  -He will continue follow-up with his primary care physician and cardiologist. Continue medications.   8. depression -worse since disease progression, not suicidal.  9. CODE STATUS -Has a long conversation about goals of care and CODE STATUS. Patient is agreeable with DO NOT RESUSCITATE and DO NOT INTUBATE -I encouraged him to call us if he has worsening short of breast, and we can see him in our symptom management clinic, instead of going to emergency room.   Plan -lab reviewed, neutropenia, will proceed C2D15 carboplatin and Taxol today with dose reduction   -I will give him longer break after today's treatment, due to his neutropenia and neuropathy. -repeat CT in 2 weeks -RTC on 6/27 for cycle 3 chemo, will change to carbotaxol every 3 weeks   All questions were answered. The patient knows to call the clinic with any problems, questions or concerns.   I spent 25 minutes counseling the patient face to face. The total time spent in the appointment was 30 minutes and more than 50%  was on counseling.     Truitt Merle, MD 12/01/2015

## 2015-12-02 ENCOUNTER — Telehealth: Payer: Self-pay | Admitting: Hematology

## 2015-12-02 ENCOUNTER — Other Ambulatory Visit: Payer: Managed Care, Other (non HMO)

## 2015-12-02 ENCOUNTER — Ambulatory Visit: Payer: Managed Care, Other (non HMO) | Admitting: Hematology

## 2015-12-02 ENCOUNTER — Telehealth: Payer: Self-pay | Admitting: *Deleted

## 2015-12-02 NOTE — Telephone Encounter (Signed)
Per staff message and POF I have scheduled appts. Advised scheduler of appts. JMW  

## 2015-12-02 NOTE — Telephone Encounter (Signed)
cld & spoke to pt and gave pt time & date of r/s CT 6/23 and appt for 6/27@9 

## 2015-12-06 ENCOUNTER — Other Ambulatory Visit: Payer: Self-pay | Admitting: Hematology

## 2015-12-08 ENCOUNTER — Ambulatory Visit (HOSPITAL_COMMUNITY): Payer: Managed Care, Other (non HMO)

## 2015-12-12 ENCOUNTER — Encounter: Payer: Self-pay | Admitting: Oncology

## 2015-12-15 ENCOUNTER — Encounter: Payer: Self-pay | Admitting: Radiation Oncology

## 2015-12-15 ENCOUNTER — Ambulatory Visit
Admission: RE | Admit: 2015-12-15 | Discharge: 2015-12-15 | Disposition: A | Discharge status: Home / Self Care | Payer: Managed Care, Other (non HMO) | Source: Ambulatory Visit | Attending: Radiation Oncology | Admitting: Radiation Oncology

## 2015-12-15 VITALS — BP 110/87 | HR 85 | Temp 98.0°F | Resp 16 | Wt 189.0 lb

## 2015-12-15 DIAGNOSIS — Z888 Allergy status to other drugs, medicaments and biological substances status: Secondary | ICD-10-CM | POA: Diagnosis not present

## 2015-12-15 DIAGNOSIS — Z933 Colostomy status: Secondary | ICD-10-CM | POA: Insufficient documentation

## 2015-12-15 DIAGNOSIS — Z79899 Other long term (current) drug therapy: Secondary | ICD-10-CM | POA: Diagnosis not present

## 2015-12-15 DIAGNOSIS — C210 Malignant neoplasm of anus, unspecified: Secondary | ICD-10-CM

## 2015-12-15 DIAGNOSIS — Z7982 Long term (current) use of aspirin: Secondary | ICD-10-CM | POA: Insufficient documentation

## 2015-12-15 DIAGNOSIS — Z7984 Long term (current) use of oral hypoglycemic drugs: Secondary | ICD-10-CM | POA: Diagnosis not present

## 2015-12-15 DIAGNOSIS — G629 Polyneuropathy, unspecified: Secondary | ICD-10-CM | POA: Diagnosis not present

## 2015-12-15 DIAGNOSIS — Z923 Personal history of irradiation: Secondary | ICD-10-CM | POA: Diagnosis not present

## 2015-12-15 NOTE — Addendum Note (Signed)
Encounter addended by: Jacqulyn Liner, RN on: 12/15/2015 12:56 PM<BR>     Documentation filed: Charges VN

## 2015-12-15 NOTE — Progress Notes (Signed)
Radiation Oncology         [phone] ________________________________  Name: Julian Palmer MRN: [id number]  Date: 12/15/2015  DOB: 21-Mar-1961  Follow-Up Visit Note  CC: Leonel Ramsay, MD  Truitt Merle, MD    ICD-9-CM ICD-10-CM   1. Recurrent squamous cell carcinoma of anus (HCC) 154.3 C21.0     Diagnosis: Recurrent anal carcinoma  Interval Since Last Radiation: 3 months  09/01/2015-09/22/2015:The patient was treated with 35 Gy in 14 fractions to the right sacrum.  Completed 01/2011 under the direction of Dr. Baruch Gouty at [facility]: The patient received 45 gray in 25 fractions covering the pelvis/inguinal area along with concurrent chemotherapy. Patient received a boost to the perianal region of 16.2 gray for a cumulative dose to this area of 61.2 gray.  Narrative:  The patient returns today for routine follow-up. He reports low back pain along his belt line as a 5/10. He explains this pain is constant. He reports he "tries to avoid taking opioids" to relieve this pain and takes two Aleve instead. Reports neuropathy in his feet continues, but it is well managed with neurontin. No edema in his feet and a slow steady gait noted by the nurse. The patient has a colostomy bag. The patient denies any urinary tract complaints.  ALLERGIES:  is allergic to sulfa antibiotics.  Meds: Current Outpatient Prescriptions  Medication Sig Dispense Refill  . aspirin (ASPIR-81) 81 MG EC tablet Take 81 mg by mouth at bedtime.     Marland Kitchen atorvastatin (LIPITOR) 80 MG tablet Take 1 tablet (80 mg total) by mouth every evening. 90 tablet 4  . Blood Glucose Monitoring Suppl (FREESTYLE LITE) DEVI     . citalopram (CELEXA) 20 MG tablet Take by mouth.    . efavirenz-emtrictabine-tenofovir (ATRIPLA) 600-200-300 MG per tablet Take 1 tablet by mouth at bedtime.     . Empagliflozin-Linagliptin (GLYXAMBI) 25-5 MG TABS Take 1 tablet by mouth daily.    Marland Kitchen gabapentin (NEURONTIN) 100 MG capsule Take  1 capsule (100 mg total) by mouth at bedtime. 90 capsule 1  . glimepiride (AMARYL) 2 MG tablet Take 4 mg by mouth daily with breakfast.     . HYDROcodone-acetaminophen (NORCO/VICODIN) 5-325 MG tablet Take 1 tablet by mouth every 8 (eight) hours as needed for moderate pain. 60 tablet 0  . losartan (COZAAR) 100 MG tablet Take 1 tablet (100 mg total) by mouth daily. 90 tablet 4  . metFORMIN (GLUCOPHAGE) 1000 MG tablet Take 1,000 mg by mouth 2 (two) times daily.      . ondansetron (ZOFRAN) 8 MG tablet Take 1 tablet (8 mg total) by mouth 2 (two) times daily as needed for nausea or vomiting. Take 8 mg by mouth 2 times daily for 3 days and as needed for nausea -  Start on third  day after chemo . 30 tablet 1  . prochlorperazine (COMPAZINE) 10 MG tablet Take 1 tablet (10 mg total) by mouth every 6 (six) hours as needed for nausea or vomiting. 30 tablet 1  . traZODone (DESYREL) 100 MG tablet Take 1 tablet (100 mg total) by mouth at bedtime. 60 tablet 1  . dexamethasone (DECADRON) 4 MG tablet Take 1 tablet (4 mg total) by mouth daily. (Patient not taking: Reported on 11/17/2015) 10 tablet 1  . HYDROcodone-homatropine (HYCODAN) 5-1.5 MG/5ML syrup Take 5 mLs by mouth every 6 (six) hours as needed for cough. (Patient not taking: Reported on 11/17/2015) 120 mL 0   No current facility-administered medications  for this encounter.   Facility-Administered Medications Ordered in Other Encounters  Medication Dose Route Frequency Provider Last Rate Last Dose  . heparin lock flush 100 unit/mL  500 Units Intravenous Once Truitt Merle, MD      . sodium chloride 0.9 % injection 10 mL  10 mL Intravenous PRN Truitt Merle, MD        Physical Findings: The patient is in no acute distress. Patient is alert and oriented.  weight is 189 lb (85.73 kg). His oral temperature is 98 F (36.7 C). His blood pressure is 110/87 and his pulse is 85. His respiration is 16 and oxygen saturation is 100%.   Lungs are clear to auscultation  bilaterally. Heart has regular rate and rhythm. No palpable cervical, supraclavicular, or axillary adenopathy. Abdomen soft, non-tender, normal bowel sounds. 5/5 motor strength in the bilateral lower extremities.  Lab Findings: Lab Results  Component Value Date   WBC 1.4* 12/01/2015   HGB 10.0* 12/01/2015   HCT 29.6* 12/01/2015   MCV 90.0 12/01/2015   PLT 243 12/01/2015    Radiographic Findings: No results found.  Impression:  The patient is recovering from the effects of radiation. The patient is clinically better after his palliative radiation treatment. He has less pain and is taking less pain medication.  Plan: The patient is scheduled for a CT of the chest/abd/pelvis tomorrow. He is also scheduled to follow up with Dr. Burr Medico and an infusion on 6/27. The patient will see me on a PRN basis and will continue chemotherapy under Dr. Burr Medico.  ____________________________________ -----------------------------------  Blair Promise, PhD, MD  This document serves as a record of services personally performed by Gery Pray, MD. It was created on his behalf by Darcus Austin, a trained medical scribe. The creation of this record is based on the scribe's personal observations and the provider's statements to them. This document has been checked and approved by the attending provider.

## 2015-12-15 NOTE — Progress Notes (Signed)
Weight and vitals stable. Reports low back pain along his belt line 5 on a scale of 0-10. He explains this pain is constant. He reports he "tries to avoid taking opioids" to relieve this pain and takes two Aleve instead. Reports neuropathy in his feet continue but, is well managed with neurontin. No edema noted in his feet. Slow steady gait noted. Patient has a colostomy bag. Patient denies any urinary tract complaints. Scheduled for CT chest 6/23. Scheduled for follow up with Burr Medico and infusion on 6/27.   BP 110/87 mmHg  Pulse 85  Temp(Src) 98 F (36.7 C) (Oral)  Resp 16  Wt 189 lb (85.73 kg)  SpO2 100% Wt Readings from Last 3 Encounters:  12/15/15 189 lb (85.73 kg)  12/01/15 182 lb (82.555 kg)  11/17/15 180 lb 9.6 oz (81.92 kg)

## 2015-12-16 ENCOUNTER — Encounter (HOSPITAL_COMMUNITY): Payer: Self-pay

## 2015-12-16 ENCOUNTER — Ambulatory Visit (HOSPITAL_COMMUNITY)
Admission: RE | Admit: 2015-12-16 | Discharge: 2015-12-16 | Disposition: A | Discharge status: Home / Self Care | Payer: Managed Care, Other (non HMO) | Source: Ambulatory Visit | Attending: Hematology | Admitting: Hematology

## 2015-12-16 DIAGNOSIS — C7951 Secondary malignant neoplasm of bone: Secondary | ICD-10-CM | POA: Insufficient documentation

## 2015-12-16 DIAGNOSIS — R590 Localized enlarged lymph nodes: Secondary | ICD-10-CM | POA: Diagnosis not present

## 2015-12-16 DIAGNOSIS — C787 Secondary malignant neoplasm of liver and intrahepatic bile duct: Secondary | ICD-10-CM | POA: Insufficient documentation

## 2015-12-16 DIAGNOSIS — C211 Malignant neoplasm of anal canal: Secondary | ICD-10-CM

## 2015-12-16 DIAGNOSIS — R918 Other nonspecific abnormal finding of lung field: Secondary | ICD-10-CM | POA: Insufficient documentation

## 2015-12-16 MED ORDER — IOPAMIDOL (ISOVUE-300) INJECTION 61%
100.0000 mL | Freq: Once | INTRAVENOUS | Status: AC | PRN
  Administered 2015-12-16: 100 mL via INTRAVENOUS

## 2015-12-16 MED ORDER — DIATRIZOATE MEGLUMINE & SODIUM 66-10 % PO SOLN
30.0000 mL | Freq: Once | ORAL | Status: AC
  Administered 2015-12-16: 30 mL via ORAL

## 2015-12-19 ENCOUNTER — Other Ambulatory Visit: Payer: Self-pay | Admitting: *Deleted

## 2015-12-19 DIAGNOSIS — C211 Malignant neoplasm of anal canal: Secondary | ICD-10-CM

## 2015-12-20 ENCOUNTER — Telehealth: Payer: Self-pay | Admitting: *Deleted

## 2015-12-20 ENCOUNTER — Other Ambulatory Visit (HOSPITAL_BASED_OUTPATIENT_CLINIC_OR_DEPARTMENT_OTHER): Payer: Managed Care, Other (non HMO)

## 2015-12-20 ENCOUNTER — Ambulatory Visit (HOSPITAL_BASED_OUTPATIENT_CLINIC_OR_DEPARTMENT_OTHER): Payer: Managed Care, Other (non HMO)

## 2015-12-20 ENCOUNTER — Ambulatory Visit: Payer: Managed Care, Other (non HMO)

## 2015-12-20 ENCOUNTER — Encounter: Payer: Self-pay | Admitting: Hematology

## 2015-12-20 ENCOUNTER — Ambulatory Visit (HOSPITAL_BASED_OUTPATIENT_CLINIC_OR_DEPARTMENT_OTHER): Payer: Managed Care, Other (non HMO) | Admitting: Hematology

## 2015-12-20 ENCOUNTER — Telehealth: Payer: Self-pay | Admitting: Hematology

## 2015-12-20 VITALS — BP 139/87 | HR 94 | Temp 99.2°F | Resp 18 | Ht 72.0 in | Wt 188.3 lb

## 2015-12-20 VITALS — BP 134/75 | HR 75 | Temp 97.7°F | Resp 19

## 2015-12-20 VITALS — BP 125/81 | HR 88 | Temp 98.2°F | Resp 16

## 2015-12-20 DIAGNOSIS — F329 Major depressive disorder, single episode, unspecified: Secondary | ICD-10-CM

## 2015-12-20 DIAGNOSIS — C211 Malignant neoplasm of anal canal: Secondary | ICD-10-CM

## 2015-12-20 DIAGNOSIS — I2510 Atherosclerotic heart disease of native coronary artery without angina pectoris: Secondary | ICD-10-CM

## 2015-12-20 DIAGNOSIS — Z21 Asymptomatic human immunodeficiency virus [HIV] infection status: Secondary | ICD-10-CM

## 2015-12-20 DIAGNOSIS — I10 Essential (primary) hypertension: Secondary | ICD-10-CM | POA: Diagnosis not present

## 2015-12-20 DIAGNOSIS — Z794 Long term (current) use of insulin: Secondary | ICD-10-CM

## 2015-12-20 DIAGNOSIS — IMO0001 Reserved for inherently not codable concepts without codable children: Secondary | ICD-10-CM

## 2015-12-20 DIAGNOSIS — C7951 Secondary malignant neoplasm of bone: Secondary | ICD-10-CM

## 2015-12-20 DIAGNOSIS — Z5111 Encounter for antineoplastic chemotherapy: Secondary | ICD-10-CM

## 2015-12-20 DIAGNOSIS — B20 Human immunodeficiency virus [HIV] disease: Secondary | ICD-10-CM

## 2015-12-20 DIAGNOSIS — E1165 Type 2 diabetes mellitus with hyperglycemia: Secondary | ICD-10-CM

## 2015-12-20 DIAGNOSIS — F32A Depression, unspecified: Secondary | ICD-10-CM

## 2015-12-20 LAB — COMPREHENSIVE METABOLIC PANEL
ALBUMIN: 3.7 g/dL (ref 3.5–5.0)
ALK PHOS: 157 U/L — AB (ref 40–150)
ALT: 28 U/L (ref 0–55)
ANION GAP: 9 meq/L (ref 3–11)
AST: 25 U/L (ref 5–34)
BUN: 19.2 mg/dL (ref 7.0–26.0)
CO2: 22 meq/L (ref 22–29)
Calcium: 9.4 mg/dL (ref 8.4–10.4)
Chloride: 106 mEq/L (ref 98–109)
Creatinine: 1.1 mg/dL (ref 0.7–1.3)
EGFR: 75 mL/min/{1.73_m2} — AB (ref >90)
Glucose: 72 mg/dl (ref 70–140)
POTASSIUM: 4.5 meq/L (ref 3.5–5.1)
SODIUM: 138 meq/L (ref 136–145)
TOTAL PROTEIN: 7.7 g/dL (ref 6.4–8.3)

## 2015-12-20 LAB — CBC WITH DIFFERENTIAL/PLATELET
BASO%: 1.5 % (ref 0.0–2.0)
Basophils Absolute: 0.1 10*3/uL (ref 0.0–0.1)
EOS%: 2.2 % (ref 0.0–7.0)
Eosinophils Absolute: 0.1 10*3/uL (ref 0.0–0.5)
HEMATOCRIT: 31.4 % — AB (ref 38.4–49.9)
HEMOGLOBIN: 10.4 g/dL — AB (ref 13.0–17.1)
LYMPH%: 23.8 % (ref 14.0–49.0)
MCH: 31.5 pg (ref 27.2–33.4)
MCHC: 33.2 g/dL (ref 32.0–36.0)
MCV: 94.9 fL (ref 79.3–98.0)
MONO#: 0.8 10*3/uL (ref 0.1–0.9)
MONO%: 24.4 % — AB (ref 0.0–14.0)
NEUT#: 1.7 10*3/uL (ref 1.5–6.5)
NEUT%: 48.1 % (ref 39.0–75.0)
PLATELETS: 209 10*3/uL (ref 140–400)
RBC: 3.31 10*6/uL — AB (ref 4.20–5.82)
RDW: 24.7 % — ABNORMAL HIGH (ref 11.0–14.6)
WBC: 3.4 10*3/uL — ABNORMAL LOW (ref 4.0–10.3)
lymph#: 0.8 10*3/uL — ABNORMAL LOW (ref 0.9–3.3)

## 2015-12-20 MED ORDER — PALONOSETRON HCL INJECTION 0.25 MG/5ML
INTRAVENOUS | Status: AC
  Filled 2015-12-20: qty 5

## 2015-12-20 MED ORDER — DIPHENHYDRAMINE HCL 50 MG/ML IJ SOLN
50.0000 mg | Freq: Once | INTRAMUSCULAR | Status: AC
  Administered 2015-12-20: 50 mg via INTRAVENOUS

## 2015-12-20 MED ORDER — SODIUM CHLORIDE 0.9 % IV SOLN
525.1500 mg | Freq: Once | INTRAVENOUS | Status: AC
  Administered 2015-12-20: 530 mg via INTRAVENOUS
  Filled 2015-12-20: qty 53

## 2015-12-20 MED ORDER — FAMOTIDINE IN NACL 20-0.9 MG/50ML-% IV SOLN
INTRAVENOUS | Status: AC
  Filled 2015-12-20: qty 50

## 2015-12-20 MED ORDER — SODIUM CHLORIDE 0.9 % IV SOLN
20.0000 mg | Freq: Once | INTRAVENOUS | Status: AC
  Administered 2015-12-20: 20 mg via INTRAVENOUS
  Filled 2015-12-20: qty 2

## 2015-12-20 MED ORDER — DIPHENHYDRAMINE HCL 50 MG/ML IJ SOLN
INTRAMUSCULAR | Status: AC
  Filled 2015-12-20: qty 1

## 2015-12-20 MED ORDER — FAMOTIDINE IN NACL 20-0.9 MG/50ML-% IV SOLN
20.0000 mg | Freq: Once | INTRAVENOUS | Status: AC
  Administered 2015-12-20: 20 mg via INTRAVENOUS

## 2015-12-20 MED ORDER — SODIUM CHLORIDE 0.9 % IV SOLN
Freq: Once | INTRAVENOUS | Status: AC
  Administered 2015-12-20: 11:00:00 via INTRAVENOUS

## 2015-12-20 MED ORDER — SODIUM CHLORIDE 0.9 % IV SOLN
145.0000 mg/m2 | Freq: Once | INTRAVENOUS | Status: AC
  Administered 2015-12-20: 300 mg via INTRAVENOUS
  Filled 2015-12-20: qty 50

## 2015-12-20 MED ORDER — SODIUM CHLORIDE 0.9 % IJ SOLN
10.0000 mL | Freq: Once | INTRAMUSCULAR | Status: AC
  Administered 2015-12-20: 10 mL
  Filled 2015-12-20: qty 10

## 2015-12-20 MED ORDER — HEPARIN SOD (PORK) LOCK FLUSH 100 UNIT/ML IV SOLN
500.0000 [IU] | Freq: Once | INTRAVENOUS | Status: AC | PRN
  Administered 2015-12-20: 500 [IU]
  Filled 2015-12-20: qty 5

## 2015-12-20 MED ORDER — PALONOSETRON HCL INJECTION 0.25 MG/5ML
0.2500 mg | Freq: Once | INTRAVENOUS | Status: AC
  Administered 2015-12-20: 0.25 mg via INTRAVENOUS

## 2015-12-20 MED ORDER — SODIUM CHLORIDE 0.9% FLUSH
10.0000 mL | INTRAVENOUS | Status: DC | PRN
  Administered 2015-12-20: 10 mL
  Filled 2015-12-20: qty 10

## 2015-12-20 NOTE — Patient Instructions (Signed)
Cancer Center Discharge Instructions for Patients Receiving Chemotherapy  Today you received the following chemotherapy agents Taxol/Carboplatin.  To help prevent nausea and vomiting after your treatment, we encourage you to take your nausea medication as directed.   If you develop nausea and vomiting that is not controlled by your nausea medication, call the clinic.   BELOW ARE SYMPTOMS THAT SHOULD BE REPORTED IMMEDIATELY:  *FEVER GREATER THAN 100.5 F  *CHILLS WITH OR WITHOUT FEVER  NAUSEA AND VOMITING THAT IS NOT CONTROLLED WITH YOUR NAUSEA MEDICATION  *UNUSUAL SHORTNESS OF BREATH  *UNUSUAL BRUISING OR BLEEDING  TENDERNESS IN MOUTH AND THROAT WITH OR WITHOUT PRESENCE OF ULCERS  *URINARY PROBLEMS  *BOWEL PROBLEMS  UNUSUAL RASH Items with * indicate a potential emergency and should be followed up as soon as possible.  Feel free to call the clinic you have any questions or concerns. The clinic phone number is [phone].  Please show the CHEMO ALERT CARD at check-in to the Emergency Department and triage nurse.    

## 2015-12-20 NOTE — Progress Notes (Signed)
[facility] NOTE  Patient Care Team: Leonel Ramsay, MD as PCP - General (Infectious Diseases) Minna Merritts, MD as Consulting Physician (Cardiology) Robert Bellow, MD (General Surgery) Leighton Ruff, MD as Consulting Physician (General Surgery) Irene Limbo, MD as Consulting Physician (Plastic Surgery) Truitt Merle, MD as Consulting Physician (Hematology) Festus Aloe, MD as Consulting Physician (Urology) Gery Pray, MD as Consulting Physician (Radiation Oncology)  DIAGNOSIS Follow-up recurrent anal cancer    Primary cancer of anal canal ([employer])   02/05/2011 Initial Diagnosis Primary cancer of anal canal, T1N0M0, s/p surgical resection with positive margines. He received definitive dose RT 45 Gy over 5 weeks with 16Gy boosting and concurrent chemo with 5FU, treated at [facility] by Drs.  Geoffreyh and [employer].    11/23/2013 Relapse/Recurrence local recurrence    04/21/2014 Surgery abdominal perianal resection (APR) and VRAM flap closure of perineal wound on 04/21/14, surgical margins were positive for cancer    06/28/2014 - 09/25/2014 Chemotherapy 5-FU 1000mg /m2/day on D1-5, ciplatin 100mg /m2 on D2, every 28 days, s/p 4 cycles    10/11/2014 Progression CT CAP showed new liver lesions and abd/pelc adenopathy, highly suspecious for progressive metastatic disease    11/02/2014 Imaging abdomen MRI w wo contrast showed new small lesions in the right hepatic lobe with nonspecific enhancement, and enlarging nodes in the porta hepatis, metastatic disease not excluded    02/24/2015 Imaging PET scan showed hypermetabolic bone mets in right sacrum, and small hypermetabolic retroperitoneal lymph nodes, no other abnormal metabolic activity within the liver or other place.   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   04/15/2015 - 08/18/2015 Chemotherapy Nivolumab 240 mg every 2 weeks, stopped due to disease  progression    07/07/2015 Imaging PET scan showed interval progression of lytic metastasis involving the righ side of sacrum, pathologic fracture involving the right sacral wing. Improvement in retroperitoneal lymph node metastasis. 7 mm nodule in the left upper lobe lung, indeterminate.   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   10/20/2015 -  Chemotherapy Weekly carboplatin AUC 2, Taxol 80 mg/m, 3 weeks on, one-week off     OTHER RELATED ISSUES: 1. (+) HIV, on therapy with good control  2. CAD, s/p stent placement in [place]: Weekly carboplatin AUC 2, Taxol 80 mg/m, 3 weeks on, one-week off, started on 10/20/2015, changed to carboplatin AUC 4.5, Taxol 145 mg/m, every 3 weeks, from 12/20/2015  INTERIM HISTORY: Julian Palmer returns for follow up and cycle 3 chemotherapy. He is doing well overall, off oxygen completely, lives independently, able to go up stairs without stopping, and has been much more physically active. His cough on your resolved, mild dyspnea on exertion, functions well at home. He has fatigue, anorexia after chemotherapy,but recovers well.   MEDICAL HISTORY:  Past Medical History  Diagnosis Date  . S/p bare metal coronary artery stent     1998  pLAD  . Hypertension   . 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 Hyperlipidemia   . History of carcinoma in situ of anal canal  2010  . ED (erectile dysfunction)   . Sigmoid diverticulosis   . PONV (postoperative nausea and vomiting)   . Wears glasses   . At risk for sleep apnea     STOP-BANG= 4     SENT TO PCP  03-23-2014  . Coronary artery disease CARDIOLOGIST--  DR Rockey Situ --  LAST LOV 04-17-2013    S/P  PCI to mid and distal LAD and BM stent x1 to pLAD  . Anxiety   . Depression   . HIV positive ([place])   . Type 2 diabetes mellitus ([place])   . Radiation 01/2011    definitive dose RT 45 Gy over 5 weeks with 16Gy boosting  . 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 Primary cancer of anal canal ([place]) 05/18/2011  . Radiation 09/01/15-09/22/15    right sacrum 35 Gy    SURGICAL HISTORY: Past Surgical History  Procedure Laterality Date  . Septoplasty  1985  . Sphincterotomy  2009  . Cardiovascular stress test  04/ 2010    low risk study/  very small region of mildly reduced perfersion is mildly reversible dLAD territory  . Resection anal mass  02-05-2011  . Excision anal polyp  2009  . Re-excision anal squamous cell carcinoma  2010  . Removal rectal mass  01-08-2014  . Eua w/ bx perianal skin  06-08-2009  . Port-a-cath placement  02-21-2011    REMOVED  2013  . Coronary angioplasty with stent placement  1998   [facility]    BM stent to pLAD and PCI to Mid and Distal LAD/  no other significant cad  . Inguinal hernia repair Bilateral 1988  . Colonoscopy with propofol  05-28-2008  . Mass excision N/A 03/26/2014    Procedure: ANAL EXAM UNDER ANESTHESIA,EXCISIONAL BIOPSY OF ANAL MASS;  Surgeon: Leighton Ruff, MD;  Location: [facility];  Service: General;  Laterality: N/A;  . Laparoscopic assisted abdominal perineal resection N/A 04/22/2014    Procedure: LAPAROSCOPIC  ABDOMINAL PERINEAL RESECTION, ;  Surgeon: Leighton Ruff, MD;  Location: [facility];  Service: General;  Laterality: N/A;  . Urethrotomy Bilateral 04/22/2014    Procedure: CYSTOSCOPY/URETHROTOMY;  Surgeon: Leighton Ruff, MD;  Location: [facility];  Service: General;  Laterality: Bilateral;  . Muscle flap closure N/A 04/22/2014    Procedure: VRAM FLAP;  Surgeon: Irene Limbo, MD;  Location: [facility];   Service: Plastics;  Laterality: N/A;  . Portacath placement Left 06/24/2014    Procedure: INSERTION PORT-A-CATH LEFT SUBCLAVIAN;  Surgeon: Leighton Ruff, MD;  Location: [facility];  Service: General;  Laterality: Left;    SOCIAL HISTORY: Social History   Social History  . Marital Status: Single    Spouse Name: N/A  . Number of Children: 0  . Years of Education: N/A   Occupational History  .  [place] History Main Topics  . Smoking status: Never Smoker   . Smokeless tobacco: Never Used  . Alcohol Use: No     Comment: RARE  . Drug Use: No  . Sexual Activity: Not on file   Other Topics Concern  . Not on file   Social History Narrative   Full time. Single. Does not regularly exercise.    Lives alone, his mother lives close by    FAMILY HISTORY: Family History  Problem Relation Age of Onset  . Diabetes type II Mother   . Breast cancer Mother 48  survivor  . Hypertension Mother   . Coronary artery disease Father   Paternal GM had utrine cancer, no other history of malignancy   ALLERGIES:  is allergic to sulfa antibiotics.  MEDICATIONS:  Current Outpatient Prescriptions  Medication Sig Dispense Refill  . aspirin (ASPIR-81) 81 MG EC tablet Take 81 mg by mouth at bedtime.     Marland Kitchen atorvastatin (LIPITOR) 80 MG tablet Take 1 tablet (80 mg total) by mouth every evening. 90 tablet 4  . Blood Glucose Monitoring Suppl (FREESTYLE LITE) DEVI     . citalopram (CELEXA) 20 MG tablet Take by mouth.    . dexamethasone (DECADRON) 4 MG tablet Take 1 tablet (4 mg total) by mouth daily. (Patient not taking: Reported on 11/17/2015) 10 tablet 1  . efavirenz-emtrictabine-tenofovir (ATRIPLA) 600-200-300 MG per tablet Take 1 tablet by mouth at bedtime.     . Empagliflozin-Linagliptin (GLYXAMBI) 25-5 MG TABS Take 1 tablet by mouth daily.    Marland Kitchen gabapentin (NEURONTIN) 100 MG capsule Take 1 capsule (100 mg total) by mouth at bedtime. 90 capsule 1  . glimepiride (AMARYL) 2 MG tablet  Take 4 mg by mouth daily with breakfast.     . HYDROcodone-acetaminophen (NORCO/VICODIN) 5-325 MG tablet Take 1 tablet by mouth every 8 (eight) hours as needed for moderate pain. 60 tablet 0  . HYDROcodone-homatropine (HYCODAN) 5-1.5 MG/5ML syrup Take 5 mLs by mouth every 6 (six) hours as needed for cough. (Patient not taking: Reported on 11/17/2015) 120 mL 0  . losartan (COZAAR) 100 MG tablet Take 1 tablet (100 mg total) by mouth daily. 90 tablet 4  . metFORMIN (GLUCOPHAGE) 1000 MG tablet Take 1,000 mg by mouth 2 (two) times daily.      . ondansetron (ZOFRAN) 8 MG tablet Take 1 tablet (8 mg total) by mouth 2 (two) times daily as needed for nausea or vomiting. Take 8 mg by mouth 2 times daily for 3 days and as needed for nausea -  Start on third  day after chemo . 30 tablet 1  . prochlorperazine (COMPAZINE) 10 MG tablet Take 1 tablet (10 mg total) by mouth every 6 (six) hours as needed for nausea or vomiting. 30 tablet 1  . traZODone (DESYREL) 100 MG tablet Take 1 tablet (100 mg total) by mouth at bedtime. 60 tablet 1   No current facility-administered medications for this visit.   Facility-Administered Medications Ordered in Other Visits  Medication Dose Route Frequency Provider Last Rate Last Dose  . heparin lock flush 100 unit/mL  500 Units Intravenous Once Truitt Merle, MD      . sodium chloride 0.9 % injection 10 mL  10 mL Intravenous PRN Truitt Merle, MD        REVIEW OF SYSTEMS:   Constitutional: Denies fevers, chills or abnormal night sweats Eyes: Denies blurriness of vision, double vision or watery eyes Ears, nose, mouth, throat, and face: Denies mucositis or sore throat Respiratory: Denies cough, dyspnea or wheezes Cardiovascular: Denies palpitation, chest discomfort or lower extremity swelling Gastrointestinal:  Denies nausea, heartburn or change in bowel habits, (+) colostomy bag Skin: Mild skin pigmentation from his previous rash. Lymphatics: Denies new lymphadenopathy or easy  bruising Neurological:Denies numbness, tingling or new weaknesses Behavioral/Psych: Mood is stable, no new changes  All other systems were reviewed with the patient and are negative.  PHYSICAL EXAMINATION: ECOG PERFORMANCE STATUS: 1 BP 139/87 mmHg  Pulse 94  Temp(Src) 99.2 F (37.3 C) (Oral)  Resp 18  Ht 6' (1.829 m)  [employer]  188 lb 4.8 oz (85.412 kg)  BMI 25.53 kg/m2  SpO2 100%  GENERAL:alert, no distress, sits in a wheelchair comfortably SKIN: skin color, texture, turgor are normal. A few skin pigmentation from his previous rash EYES: normal, conjunctiva are pink and non-injected, sclera clear OROPHARYNX:no exudate, no erythema and lips, buccal mucosa, and tongue normal  NECK: supple, thyroid normal size, non-tender, without nodularity LYMPH:  no palpable lymphadenopathy in the cervical, axillary or inguinal LUNGS: clear to auscultation and percussion with normal breathing effort HEART: regular rate & rhythm and no murmurs and no lower extremity edema ABDOMEN:abdomen soft, non-tender and normal bowel sounds. (+) Colostomy bag. The perianal reconstruction site is completely healed.  Musculoskeletal:no cyanosis of digits and no clubbing  PSYCH: alert & oriented x 3 with fluent speech NEURO: no focal motor/sensory deficits  LABORATORY DATA:  I have reviewed the data as listed CBC Latest Ref Rng 12/20/2015 12/01/2015 11/24/2015  WBC 4.0 - 10.3 10e3/uL 3.4(L) 1.4(L) 2.7(L)  Hemoglobin 13.0 - 17.1 g/dL 10.4(L) 10.0(L) 10.8(L)  Hematocrit 38.4 - 49.9 % 31.4(L) 29.6(L) 32.7(L)  Platelets 140 - 400 10e3/uL 209 243 166    CMP Latest Ref Rng 12/20/2015 12/01/2015 11/24/2015  Glucose 70 - 140 mg/dl 72 137 136  BUN 7.0 - 26.0 mg/dL 19.2 20.4 18.2  Creatinine 0.7 - 1.3 mg/dL 1.1 1.0 1.0  Sodium 136 - 145 mEq/L 138 136 137  Potassium 3.5 - 5.1 mEq/L 4.5 4.3 4.4  CO2 22 - 29 mEq/L 22 24 22   Calcium 8.4 - 10.4 mg/dL 9.4 8.8 9.4  Total Protein 6.4 - 8.3 g/dL 7.7 7.3 7.5  Total Bilirubin 0.20 - 1.20  mg/dL <0.30 <0.30 0.30  Alkaline Phos 40 - 150 U/L 157(H) 152(H) 185(H)  AST 5 - 34 U/L 25 27 29   ALT 0 - 55 U/L 28 29 33   ANC 1.7 today   PATHOLOGY REPORT 04/22/2014 Diagnosis 1. Soft tissue, biopsy, right pelvic sidewall; r/o malignancy - FIBROADIPOSE TISSUE, NO EVIDENCE OF MALIGNANCY. 2. Colon, segmental resection for tumor, rectum,sigmoid and anus - RECURRENT INVASIVE POORLY DIFFERENTIATED SQUAMOUS CELL CARCINOMA, INVADING THROUGH THE MUSCULARIS PROPRIA, INTO PERICOLONIC FATTY TISSUE, EXTENDING TO THE DEEP INKED MARGIN. - PERINEURAL INVASION AND ANGIOLYMPHATIC PRESENT. - SEVEN OF SEVENTEEN LYMPH NODES, POSITIVE FOR METASTATIC CARCINOMA (7/17) Microscopic Comment 2. Anus Specimen: Anus, rectum, sigmoid Procedure: Segmental resection Tumor site: Distal ends of specimen Specimen integrity: Intact Invasive tumor: Maximum size: At least 2.5 cm Histologic type(s): Invasive squamous cell carcinoma Histologic grade and differentiation: G2-3, moderately to poorly differentiated Microscopic extension of invasive tumor: Invading through the muscularis propria into pericolonic fatty tissue involving adjacent skeletal muscle tissue Lymph-Vascular invasion: Present Peri-neural invasion: Present Resection margins: Deep margin is positive for tumor Treatment effect (neo-adjuvant therapy): Present, minimally Lymph nodes: number examined 17; number positive: 7 Pathologic Staging: ypT2, yN1 (R1) Ancillary studies: N/A  Diagnosis 03/15/2015 Bone, biopsy, caudal left side of the sacrum - METASTATIC SQUAMOUS CELL CARCINOMA, SEE COMMENT. Microscopic Comment Biopsies demonstrate extensive involvement by metastatic focally keratinizing, well to moderately differentiated squamous cell carcinoma. The previous anorectal resection demonstrating invasive squamous cell carcinoma is noted [id number]). (CRR:ecj 03/16/2015)  RADIOGRAPHIC STUDIES: I have personally reviewed the radiological images as  listed and agreed with the findings in the report.  CT chest PE protocol 12/16/2015 IMPRESSION: 1. Chronic bronchitic type interstitial lung changes and tree-in-bud appearance. This has slightly improved since the prior CTA from 07/14/2015. 2. Slight interval enlargement of an ill-defined sub solid nodular lesion in the  right middle lobe. This is more likely scarring or atelectasis but attention on future scans is suggested. 3. Stable hepatic metastatic lesions. No new disease. 4. No abdominal/pelvic lymphadenopathy or evidence of recurrent tumor in the pelvis. 5. Stable mixed lytic and sclerotic metastatic bone disease involving the pelvis. No new osseous metastatic disease.    ASSESSMENT & PLAN:  56 years old male with past medical history of anal squamous cell carcinoma, status post surgical resection and concurrent chemoradiation in 2009, now has local recurrence status post APR and  reconstruction.ypT2, yN1 (R1), unfortunately his deep surgical margin was positive, and 7 out of 17 lymph nodes were positive also. No distant metastasis on the preop PET scan. His medical history is also complicated by diabetes, coronary artery disease status post stent placement, and HIV on treatment.   1. Local recurrence of anal squamous cell carcinoma, with positive surgical margines, biopsy proven sacral bone mets on 03/15/2015, lung mets in 09/2015 -His CT chest in 09/2015 showed bilateral lung metastasis -He knows this is terminal and incurable, his prognosis is extremely poor, giving he has had multiple lines of therapy. -He started weekly carbo and Taxol, tolerated well so far, he has developed mild peripheral neuropathy -he has significant clinical improvement since he started chemotherapy, off oxygen, cough Julian Palmer resolved, performance status much improved -I reviewed his restaging CT scan images in person with patient. The infiltrative lesions in the lungs are likely metastatic cancer, which have  significantly improved. His liver lesions have been there for years, unlikely metastatic disease. -i recommend him to continue chemotherapy. I will change caboplatin and Taxol to every 3 weeks, to decrease the frequency of his visit. His performance status has much improved, I think he should be able tolerate the higher dose. I'll  Given carboplatin AUC 4.5 and Taxol 145 mg/square meter, giving his underlying HIV. -we will watch his neuropathy closely -Lab results reviewed with patient, adequate for treatment, we'll proceed chemotherapy today   2. Cough, dyspnea and hypoxia -Secondary to his underlying lung metastasis, much improved since he started chemotherapy -he is off oxygen,cough with minimal now.  3. Peripheral neuropathy, G1-2 -Mainly in her feet, more noticeable at night -He is diabetic, has baseline mild neuropathy -continue Neurontin   4. Sacral pain -secondary to his sacral bone metastasis, status post palliative radiation -He will continue oxycodone or hydrocodone as needed for pain. -He tried tramadol before, it did not work well -I suggest him to use Tylenol as needed.   5. Diabetes -controlled recently  -continue metformin and glimepiride, follow up with his endocrinologist   6. HIV  -Well controlled. Continue his treatment . His recent CD4 count was normal.   7. Hypertension and coronary artery disease  -He will continue follow-up with his primary care physician and cardiologist. Continue medications.   8. depression -worse since disease progression, not suicidal.  9. CODE STATUS -Has a long conversation about goals of care and CODE STATUS. Patient is agreeable with DO NOT RESUSCITATE and DO NOT INTUBATE -I encouraged him to call us if he has worsening short of breast, and we can see him in our symptom management clinic, instead of going to emergency room.   Plan -CT scan results reviewed, good response, we'll continue chemotherapy -lab reviewed,adequate for  treatment, will proceed C3 carboplatin and Taxol today  And continue every 3 weeks -return to clinic in 3 weeks for next cycle treatment  All questions were answered. The patient knows to call the clinic with any  problems, questions or concerns.   I spent 25 minutes counseling the patient face to face. The total time spent in the appointment was 30 minutes and more than 50% was on counseling.     Truitt Merle, MD 12/20/2015

## 2015-12-20 NOTE — Progress Notes (Signed)
1025: Pt presented to infusion room diaphoretic and stating he needed something to increase his blood sugar.  Pt states he is a diabetic and has not checked his BS since yesterday.  Pt states he has taken some medications this morning that could affect his BS.  Pt given orange juice and snack.  Vitals obtained and remain stable.  Pt in no acute distress at this time.  Will continue to monitor.    1045: After OJ x 2 and snack, pt states he feels much better.

## 2015-12-20 NOTE — Telephone Encounter (Signed)
Per staff message and POF I have scheduled appts. Advised scheduler of appts. JMW  

## 2015-12-20 NOTE — Telephone Encounter (Signed)
per pof to sch pt appt-gave pt copy of avs °

## 2015-12-21 ENCOUNTER — Telehealth: Payer: Self-pay | Admitting: Hematology

## 2015-12-21 NOTE — Telephone Encounter (Signed)
cld & spoke to pt and gave pt time & date of appt for 7/18 @8 :30

## 2015-12-26 ENCOUNTER — Telehealth: Payer: Self-pay | Admitting: *Deleted

## 2015-12-26 NOTE — Telephone Encounter (Signed)
Received call from pt asking if Dr. Burr Medico could increase Gabapentin dose to help with neuropathy.   Dr. Burr Medico notified.   Spoke with pt and was informed that pt has been taking Neurontin 200 mg at bedtime for about 2 weeks now.   Instructed pt to increase Neurontin to 300 mg at bedtime starting today 12/26/15 - as per original script by Dr. Burr Medico.   Pt also has 1 additional refill available. Pt voiced understanding and stated he would contact his pharmacy for refill. Pt's   Phone    [phone].

## 2015-12-27 IMAGING — CT CT CHEST W/ CM
2 of 5 series · 15 of 46 positions shown, 17 images · IV contrast (OMNIPAQUE)
Comparison: PET-CT 01/19/2014, CT abdomen/pelvis 06/23/2014. No
prior diagnostic chest CT for comparison since 02/14/2011

CLINICAL DATA: Anal cancer diagnosed December 2013, distal colectomy
and colostomy placement. Most recent chemotherapy September 2014.

EXAM:
CT CHEST, ABDOMEN, AND PELVIS WITH CONTRAST
TECHNIQUE: Multidetector CT imaging of the chest, abdomen and pelvis was
performed following the standard protocol during bolus
administration of intravenous contrast.
CONTRAST:  100mL OMNIPAQUE IOHEXOL 300 MG/ML  SOLN

[Series 2: cap with st · axial · 0.83mm/px · z∈[-670,-70]mm · 12 of 136 slices shown, 14 images]
[im 8/136  soft-tissue]
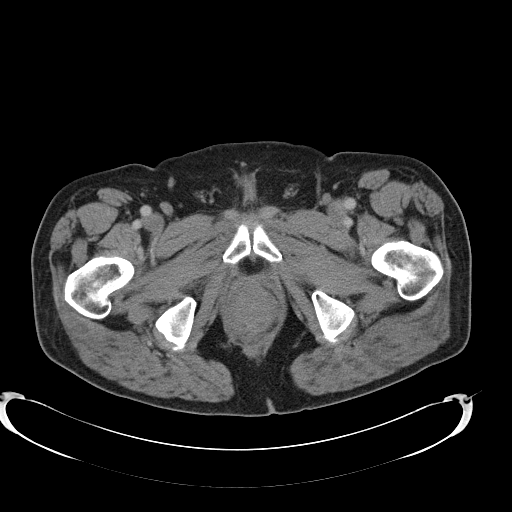
[im 8/136  bone]
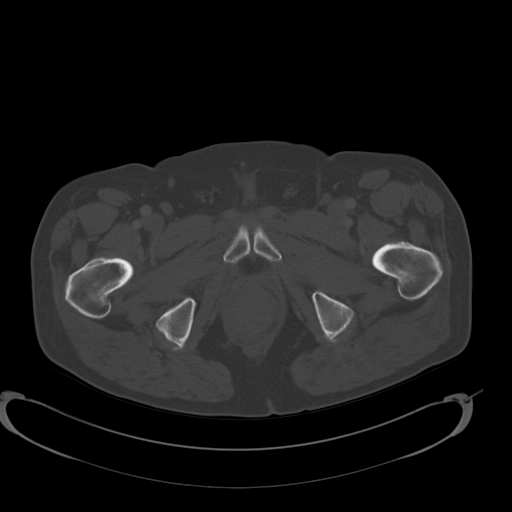
[im 24/136  soft-tissue]
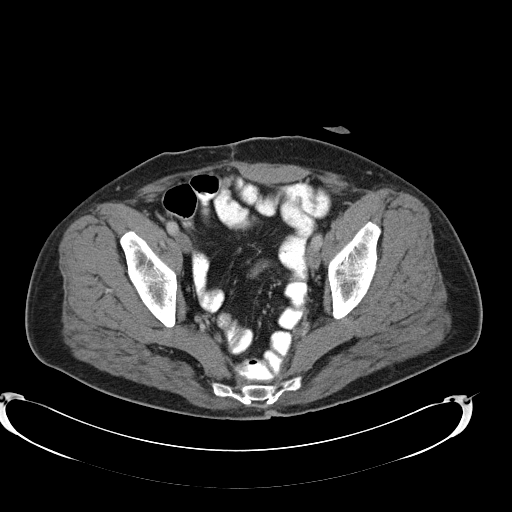
[im 32/136  soft-tissue]
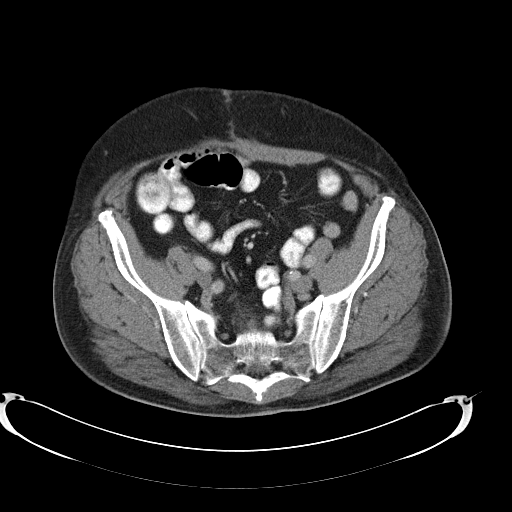
[im 40/136  soft-tissue]
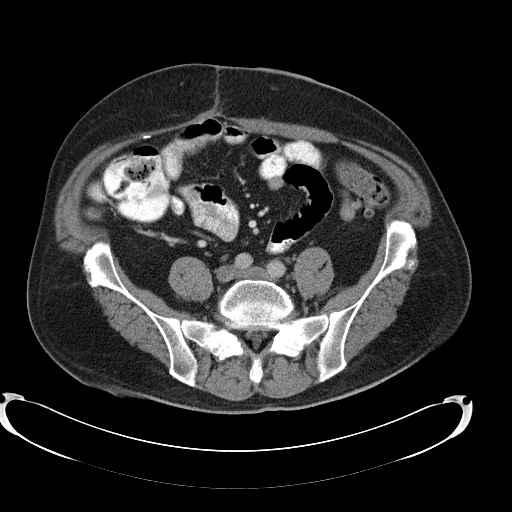
[im 56/136  soft-tissue]
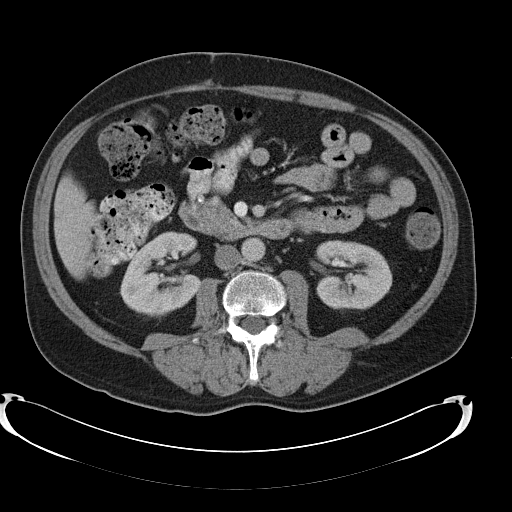
[im 64/136  soft-tissue]
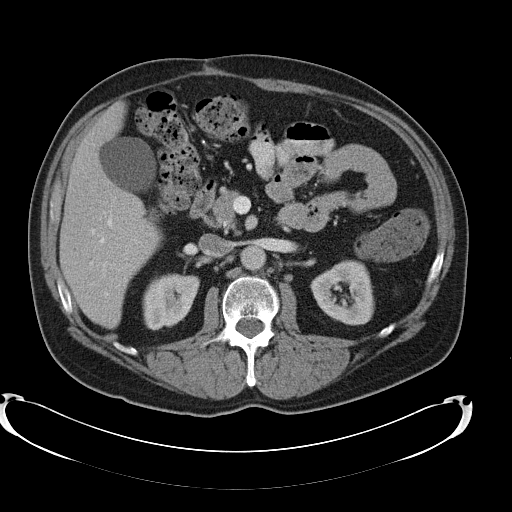
[im 72/136  soft-tissue]
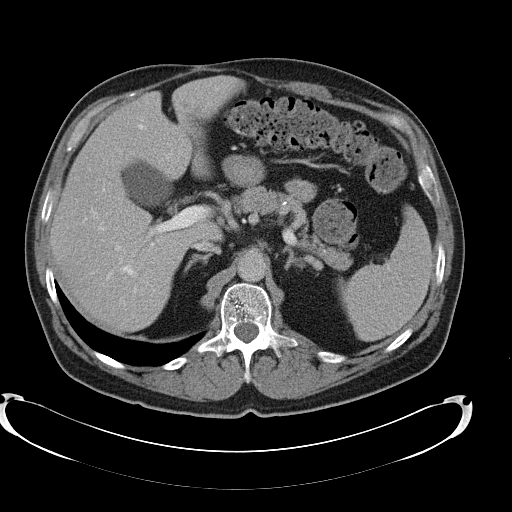
[im 88/136  soft-tissue]
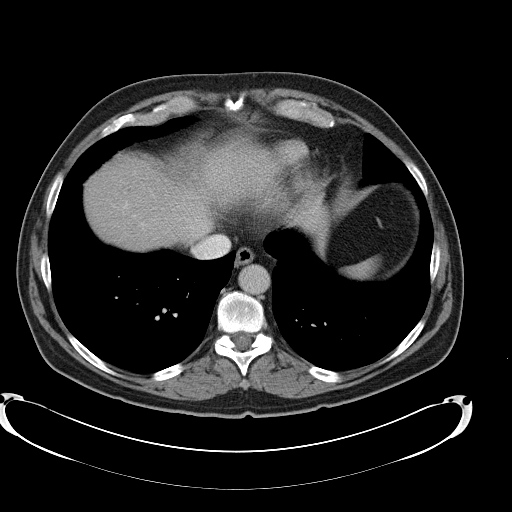
[im 96/136  soft-tissue]
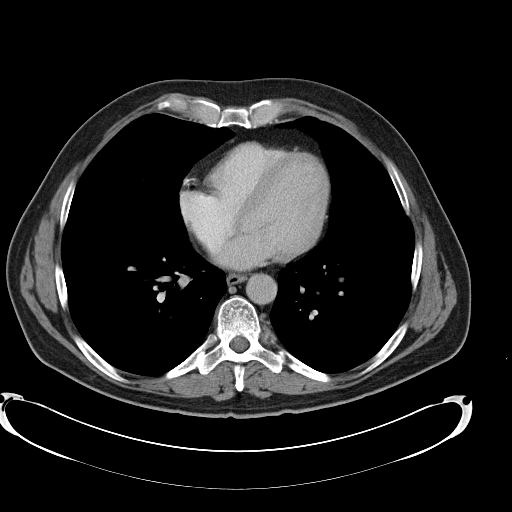
[im 96/136  bone]
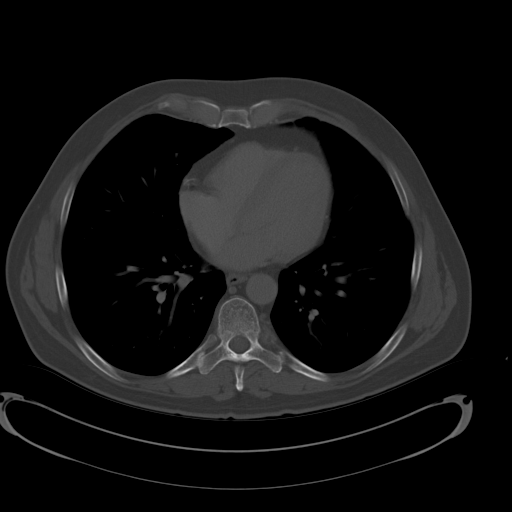
[im 104/136  soft-tissue]
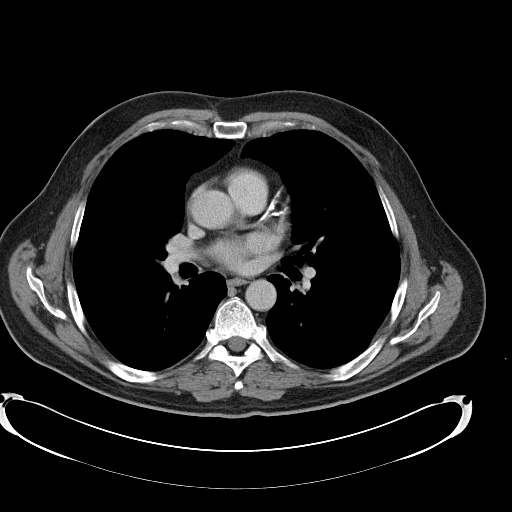
[im 120/136  soft-tissue]
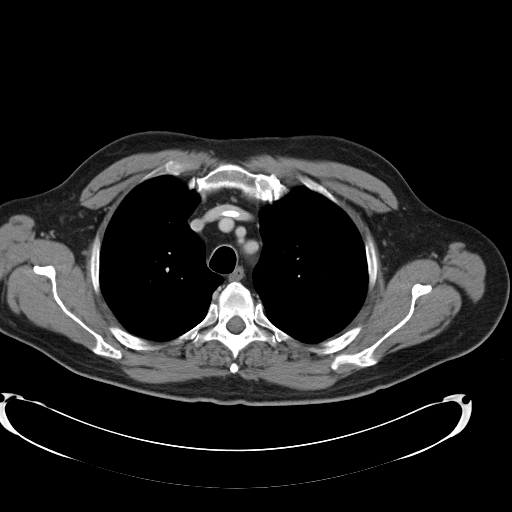
[im 128/136  soft-tissue]
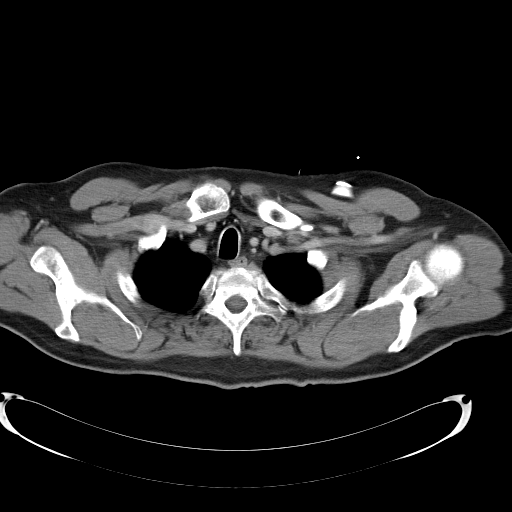

[Series 602: <mpr thick range> · coronal · 1.33mm/px · 3 of 105 slices shown]
[im 35/105  soft-tissue]
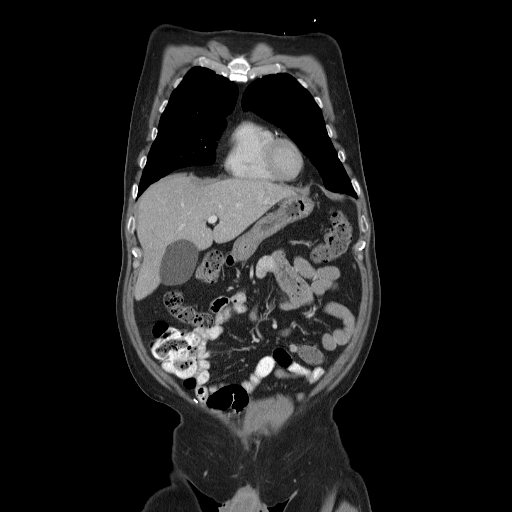
[im 47/105  soft-tissue]
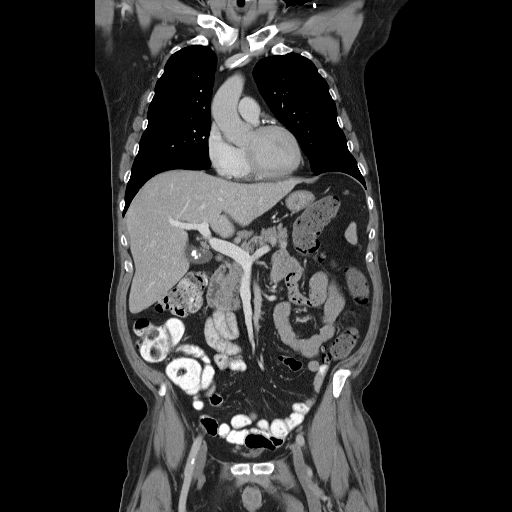
[im 58/105  soft-tissue]
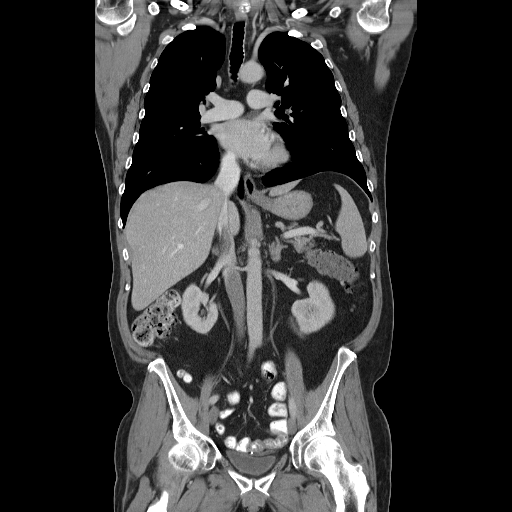

[15 of 46 positions shown; findings below may reference images not displayed]

FINDINGS: CT CHEST FINDINGS

Mediastinum/Nodes: Left-sided Port-A-Cath in place with tip in the
upper SVC. Heart size is normal. Trace pericardial fluid. Severe
atheromatous coronary arterial calcification. No aortic aneurysm.

No mediastinal or hilar lymphadenopathy.

Lungs/Pleura: Lungs are clear. Central airways are patent. Right
lower lobe all incidentally noted. No pleural effusion.

Upper abdomen: Please see dedicated report below.

Musculoskeletal: No acute osseous abnormality or lytic or sclerotic
osseous lesion in the past.

CT ABDOMEN AND PELVIS FINDINGS

Lower chest:  Please see dedicated report above.

Hepatobiliary: Hepatic hemangiomas, largest 2.9 cm lateral segment
left hepatic lobe, are stable. There are several ill-defined
subcentimeter new hypodense hepatic lesions, including a 0.8 cm
anterior segment right hepatic lobe lesion image 62, 0.7 cm
hypodense lesion anterior segment right hepatic lobe image 56, and
0.6 cm hypodense hepatic lesion in the posterior segment right
hepatic lobe image 68. Others are also visualized, measuring less
than 3 mm and not further characterized but new since the prior
exam. Gallstones reidentified without other CT evidence for acute
cholecystitis.

Pancreas: Normal

Spleen: Normal

Adrenals/Urinary Tract: 1.1 cm left mid renal cortical hypodense
lesion is identified image 78. This is not significantly changed
allowing for differences in phase of contrast opacification and
technique. 3 mm left lower renal pole cortical hypodense lesion is
stable image 83, nonspecific. No hydroureteronephrosis.

The adrenal glands are normal.

Stomach/Bowel: Evidence of distal colectomy and left lower quadrant
diverting colostomy again noted. Presacral soft tissue thickening is
reidentified. No mass lesion adjacent to the surgical clips. No
bowel wall thickening or focal segmental dilatation is identified.
Stomach appears normal.

Vascular/Lymphatic: Gastrohepatic 1.3 cm lymph node image 64
slightly larger.

Pericaval 1.4 cm lymph node image 69 is larger.

Multiple small retroperitoneal nodes are identified, increased in
size since previously, with a representative very aortic node now
0.9 cm image 75.

Abnormal bilateral pelvic sidewall lymph node enlargement is
identified, representative left internal iliac chain 0.5 cm lymph
node identified image 105 and right internal iliac chain lymph node
0.8 cm image 109.

Reproductive: Prostate grossly unremarkable.

Other: Atrophy or surgical absence right rectus abdominis muscle.

Musculoskeletal: Lumbar spine disc degenerative change. No acute
osseous abnormality.
IMPRESSION: Interval development of new hypodense ill-defined hepatic lesions
which, in the context of increased gastrohepatic, retroperitoneal,
and pelvic sidewall lymphadenopathy, are highly suspicious for
progressive metastatic disease.

No evidence for intrathoracic metastatic disease or acute
abnormality.

## 2016-01-03 ENCOUNTER — Telehealth: Payer: Self-pay | Admitting: *Deleted

## 2016-01-03 ENCOUNTER — Telehealth: Payer: Self-pay | Admitting: Cardiovascular Disease

## 2016-01-03 ENCOUNTER — Other Ambulatory Visit: Payer: Self-pay | Admitting: Hematology

## 2016-01-03 MED ORDER — GABAPENTIN 100 MG PO CAPS: 200.0000 mg | ORAL_CAPSULE | Freq: Three times a day (TID) | ORAL | Status: DC

## 2016-01-03 NOTE — Telephone Encounter (Signed)
Received vm call from pt asking for renewal of gabapentin.  He reports that Dr Burr Medico said he could take up to 9/d but he takes 3-4/d & pharmacy won't fill d/t current instructions stating too early.  Message to Dr Burr Medico for instructions.

## 2016-01-03 NOTE — Telephone Encounter (Signed)
-    LMOV to patient regarding release of information packet.  - Mailed release packet to patient. Received records request Disability Determination Services, forwarded to [employer] for processing.  

## 2016-01-10 ENCOUNTER — Ambulatory Visit: Payer: Managed Care, Other (non HMO)

## 2016-01-10 ENCOUNTER — Other Ambulatory Visit (HOSPITAL_BASED_OUTPATIENT_CLINIC_OR_DEPARTMENT_OTHER): Payer: Managed Care, Other (non HMO)

## 2016-01-10 ENCOUNTER — Ambulatory Visit (HOSPITAL_BASED_OUTPATIENT_CLINIC_OR_DEPARTMENT_OTHER): Payer: Managed Care, Other (non HMO)

## 2016-01-10 ENCOUNTER — Encounter: Payer: Self-pay | Admitting: Hematology

## 2016-01-10 ENCOUNTER — Telehealth: Payer: Self-pay | Admitting: Hematology

## 2016-01-10 ENCOUNTER — Ambulatory Visit (HOSPITAL_BASED_OUTPATIENT_CLINIC_OR_DEPARTMENT_OTHER): Payer: Managed Care, Other (non HMO) | Admitting: Hematology

## 2016-01-10 VITALS — BP 132/70 | HR 87 | Temp 98.2°F | Resp 18 | Ht 72.0 in | Wt 189.7 lb

## 2016-01-10 DIAGNOSIS — G622 Polyneuropathy due to other toxic agents: Secondary | ICD-10-CM

## 2016-01-10 DIAGNOSIS — C7951 Secondary malignant neoplasm of bone: Secondary | ICD-10-CM

## 2016-01-10 DIAGNOSIS — B20 Human immunodeficiency virus [HIV] disease: Secondary | ICD-10-CM | POA: Diagnosis not present

## 2016-01-10 DIAGNOSIS — R0600 Dyspnea, unspecified: Secondary | ICD-10-CM

## 2016-01-10 DIAGNOSIS — E1165 Type 2 diabetes mellitus with hyperglycemia: Secondary | ICD-10-CM

## 2016-01-10 DIAGNOSIS — IMO0001 Reserved for inherently not codable concepts without codable children: Secondary | ICD-10-CM

## 2016-01-10 DIAGNOSIS — C7800 Secondary malignant neoplasm of unspecified lung: Secondary | ICD-10-CM | POA: Diagnosis not present

## 2016-01-10 DIAGNOSIS — Z21 Asymptomatic human immunodeficiency virus [HIV] infection status: Secondary | ICD-10-CM

## 2016-01-10 DIAGNOSIS — Z5111 Encounter for antineoplastic chemotherapy: Secondary | ICD-10-CM | POA: Diagnosis not present

## 2016-01-10 DIAGNOSIS — I2510 Atherosclerotic heart disease of native coronary artery without angina pectoris: Secondary | ICD-10-CM

## 2016-01-10 DIAGNOSIS — R05 Cough: Secondary | ICD-10-CM

## 2016-01-10 DIAGNOSIS — C211 Malignant neoplasm of anal canal: Secondary | ICD-10-CM

## 2016-01-10 DIAGNOSIS — R0902 Hypoxemia: Secondary | ICD-10-CM

## 2016-01-10 DIAGNOSIS — F32A Depression, unspecified: Secondary | ICD-10-CM

## 2016-01-10 DIAGNOSIS — Z794 Long term (current) use of insulin: Secondary | ICD-10-CM

## 2016-01-10 DIAGNOSIS — G893 Neoplasm related pain (acute) (chronic): Secondary | ICD-10-CM

## 2016-01-10 DIAGNOSIS — F329 Major depressive disorder, single episode, unspecified: Secondary | ICD-10-CM

## 2016-01-10 DIAGNOSIS — I10 Essential (primary) hypertension: Secondary | ICD-10-CM

## 2016-01-10 DIAGNOSIS — E119 Type 2 diabetes mellitus without complications: Secondary | ICD-10-CM

## 2016-01-10 LAB — CBC WITH DIFFERENTIAL/PLATELET
BASO%: 0.8 % (ref 0.0–2.0)
BASOS ABS: 0 10*3/uL (ref 0.0–0.1)
EOS ABS: 0.1 10*3/uL (ref 0.0–0.5)
EOS%: 1.6 % (ref 0.0–7.0)
HEMATOCRIT: 29.2 % — AB (ref 38.4–49.9)
HEMOGLOBIN: 9.7 g/dL — AB (ref 13.0–17.1)
LYMPH#: 0.9 10*3/uL (ref 0.9–3.3)
LYMPH%: 25.5 % (ref 14.0–49.0)
MCH: 32.8 pg (ref 27.2–33.4)
MCHC: 33.2 g/dL (ref 32.0–36.0)
MCV: 98.6 fL — AB (ref 79.3–98.0)
MONO#: 0.7 10*3/uL (ref 0.1–0.9)
MONO%: 19.2 % — AB (ref 0.0–14.0)
NEUT#: 1.9 10*3/uL (ref 1.5–6.5)
NEUT%: 52.9 % (ref 39.0–75.0)
Platelets: 119 10*3/uL — ABNORMAL LOW (ref 140–400)
RBC: 2.96 10*6/uL — ABNORMAL LOW (ref 4.20–5.82)
RDW: 20.6 % — ABNORMAL HIGH (ref 11.0–14.6)
WBC: 3.7 10*3/uL — ABNORMAL LOW (ref 4.0–10.3)

## 2016-01-10 LAB — COMPREHENSIVE METABOLIC PANEL
ALBUMIN: 3.7 g/dL (ref 3.5–5.0)
ALK PHOS: 151 U/L — AB (ref 40–150)
ALT: 25 U/L (ref 0–55)
AST: 23 U/L (ref 5–34)
Anion Gap: 10 mEq/L (ref 3–11)
BUN: 20.2 mg/dL (ref 7.0–26.0)
CALCIUM: 8.8 mg/dL (ref 8.4–10.4)
CHLORIDE: 106 meq/L (ref 98–109)
CO2: 22 mEq/L (ref 22–29)
Creatinine: 1.1 mg/dL (ref 0.7–1.3)
EGFR: 72 mL/min/{1.73_m2} — AB (ref >90)
Glucose: 138 mg/dl (ref 70–140)
POTASSIUM: 4.3 meq/L (ref 3.5–5.1)
Sodium: 137 mEq/L (ref 136–145)
Total Bilirubin: 0.3 mg/dL (ref 0.20–1.20)
Total Protein: 7.6 g/dL (ref 6.4–8.3)

## 2016-01-10 MED ORDER — PACLITAXEL CHEMO INJECTION 300 MG/50ML
144.0000 mg/m2 | Freq: Once | INTRAVENOUS | Status: AC
  Administered 2016-01-10: 300 mg via INTRAVENOUS
  Filled 2016-01-10: qty 50

## 2016-01-10 MED ORDER — FAMOTIDINE IN NACL 20-0.9 MG/50ML-% IV SOLN
INTRAVENOUS | Status: AC
  Filled 2016-01-10: qty 50

## 2016-01-10 MED ORDER — SODIUM CHLORIDE 0.9 % IV SOLN
466.8000 mg | Freq: Once | INTRAVENOUS | Status: AC
  Administered 2016-01-10: 470 mg via INTRAVENOUS
  Filled 2016-01-10: qty 47

## 2016-01-10 MED ORDER — DIPHENHYDRAMINE HCL 50 MG/ML IJ SOLN
50.0000 mg | Freq: Once | INTRAMUSCULAR | Status: AC
  Administered 2016-01-10: 50 mg via INTRAVENOUS

## 2016-01-10 MED ORDER — SODIUM CHLORIDE 0.9% FLUSH
10.0000 mL | INTRAVENOUS | Status: DC | PRN
  Administered 2016-01-10: 10 mL
  Filled 2016-01-10: qty 10

## 2016-01-10 MED ORDER — PALONOSETRON HCL INJECTION 0.25 MG/5ML
0.2500 mg | Freq: Once | INTRAVENOUS | Status: AC
  Administered 2016-01-10: 0.25 mg via INTRAVENOUS

## 2016-01-10 MED ORDER — PALONOSETRON HCL INJECTION 0.25 MG/5ML
INTRAVENOUS | Status: AC
  Filled 2016-01-10: qty 5

## 2016-01-10 MED ORDER — FAMOTIDINE IN NACL 20-0.9 MG/50ML-% IV SOLN
20.0000 mg | Freq: Once | INTRAVENOUS | Status: AC
  Administered 2016-01-10: 20 mg via INTRAVENOUS

## 2016-01-10 MED ORDER — HEPARIN SOD (PORK) LOCK FLUSH 100 UNIT/ML IV SOLN
500.0000 [IU] | Freq: Once | INTRAVENOUS | Status: AC | PRN
  Administered 2016-01-10: 500 [IU]
  Filled 2016-01-10: qty 5

## 2016-01-10 MED ORDER — SODIUM CHLORIDE 0.9 % IV SOLN
20.0000 mg | Freq: Once | INTRAVENOUS | Status: AC
  Administered 2016-01-10: 20 mg via INTRAVENOUS
  Filled 2016-01-10: qty 2

## 2016-01-10 MED ORDER — SODIUM CHLORIDE 0.9 % IV SOLN
Freq: Once | INTRAVENOUS | Status: AC
  Administered 2016-01-10: 11:00:00 via INTRAVENOUS

## 2016-01-10 MED ORDER — DIPHENHYDRAMINE HCL 50 MG/ML IJ SOLN
INTRAMUSCULAR | Status: AC
  Filled 2016-01-10: qty 1

## 2016-01-10 NOTE — Progress Notes (Signed)
[place] NOTE  Patient Care Team: Leonel Ramsay, MD as PCP - General (Infectious Diseases) Minna Merritts, MD as Consulting Physician (Cardiology) Robert Bellow, MD (General Surgery) Leighton Ruff, MD as Consulting Physician (General Surgery) Irene Limbo, MD as Consulting Physician (Plastic Surgery) Truitt Merle, MD as Consulting Physician (Hematology) Festus Aloe, MD as Consulting Physician (Urology) Gery Pray, MD as Consulting Physician (Radiation Oncology)  DIAGNOSIS Follow-up recurrent anal cancer    Primary cancer of anal canal ([place])   02/05/2011 Initial Diagnosis Primary cancer of anal canal, T1N0M0, s/p surgical resection with positive margines. He received definitive dose RT 45 Gy over 5 weeks with 16Gy boosting and concurrent chemo with 5FU, treated at [facility] by Drs.  Geoffreyh and [employer].    11/23/2013 Relapse/Recurrence local recurrence    04/21/2014 Surgery abdominal perianal resection (APR) and VRAM flap closure of perineal wound on 04/21/14, surgical margins were positive for cancer    06/28/2014 - 09/25/2014 Chemotherapy 5-FU 1000mg /m2/day on D1-5, ciplatin 100mg /m2 on D2, every 28 days, s/p 4 cycles    10/11/2014 Progression CT CAP showed new liver lesions and abd/pelc adenopathy, highly suspecious for progressive metastatic disease    11/02/2014 Imaging abdomen MRI w wo contrast showed new small lesions in the right hepatic lobe with nonspecific enhancement, and enlarging nodes in the porta hepatis, metastatic disease not excluded    02/24/2015 Imaging PET scan showed hypermetabolic bone mets in right sacrum, and small hypermetabolic retroperitoneal lymph nodes, no other abnormal metabolic activity within the liver or other place.   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   04/15/2015 - 08/18/2015 Chemotherapy Nivolumab 240 mg every 2 weeks, stopped due to disease  progression    07/07/2015 Imaging PET scan showed interval progression of lytic metastasis involving the righ side of sacrum, pathologic fracture involving the right sacral wing. Improvement in retroperitoneal lymph node metastasis. 7 mm nodule in the left upper lobe lung, indeterminate.   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   10/20/2015 -  Chemotherapy Weekly carboplatin AUC 2, Taxol 80 mg/m, 3 weeks on, one-week off     OTHER RELATED ISSUES: 1. (+) HIV, on therapy with good control  2. CAD, s/p stent placement in [place]: Weekly carboplatin AUC 2, Taxol 80 mg/m, 3 weeks on, one-week off, started on 10/20/2015, changed to carboplatin AUC 4.0-4.5, Taxol 150mg /m, every 3 weeks, from 12/20/2015  INTERIM HISTORY: Julian Palmer returns for follow up and cycle 4 chemotherapy. He is doing well overall. He tolerated the last cycle chemo well 3 weeks ago, his neuropathy is mild and stable. He has mild numbness and tingling on his fingers, no impact on his hand functions. Mild numbness on his toes, no issues with his balance. He has mild sacral pain, occasionally take oxycodone. Denies any other significant pain, his cough and dyspnea has nearly resolved, he is able to live independently and function well.  MEDICAL HISTORY:  Past Medical History  Diagnosis Date  . S/p bare metal coronary artery stent     1998  pLAD  . Hypertension   . 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 Hyperlipidemia   . History of carcinoma in situ of anal canal     2010  . ED (erectile dysfunction)   . Sigmoid  diverticulosis   . PONV (postoperative nausea and vomiting)   . Wears glasses   . At risk for sleep apnea     STOP-BANG= 4     SENT TO PCP 03-23-2014  . Coronary artery disease CARDIOLOGIST--  DR Rockey Situ --  LAST LOV 04-17-2013    S/P  PCI to mid and distal LAD and BM stent x1 to pLAD  . Anxiety   . Depression   . HIV positive ([place])   . Type 2 diabetes mellitus ([place])   . Radiation 01/2011    definitive dose RT 45 Gy over 5 weeks with 16Gy boosting  . 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 Primary cancer of anal canal ([place]) 05/18/2011  . Radiation 09/01/15-09/22/15    right sacrum 35 Gy    SURGICAL HISTORY: Past Surgical History  Procedure Laterality Date  . Septoplasty  1985  . Sphincterotomy  2009  . Cardiovascular stress test  04/ 2010    low risk study/  very small region of mildly reduced perfersion is mildly reversible dLAD territory  . Resection anal mass  02-05-2011  . Excision anal polyp  2009  . Re-excision anal squamous cell carcinoma  2010  . Removal rectal mass  01-08-2014  . Eua w/ bx perianal skin  06-08-2009  . Port-a-cath placement  02-21-2011    REMOVED  2013  . Coronary angioplasty with stent placement  1998   [facility]    BM stent to pLAD and PCI to Mid and Distal LAD/  no other significant cad  . Inguinal hernia repair Bilateral 1988  . Colonoscopy with propofol  05-28-2008  . Mass excision N/A 03/26/2014    Procedure: ANAL EXAM UNDER ANESTHESIA,EXCISIONAL BIOPSY OF ANAL MASS;  Surgeon: Leighton Ruff, MD;  Location: [facility];  Service: General;  Laterality: N/A;  . Laparoscopic assisted abdominal perineal resection N/A 04/22/2014    Procedure: LAPAROSCOPIC  ABDOMINAL PERINEAL RESECTION, ;  Surgeon: Leighton Ruff, MD;  Location: [facility];  Service: General;  Laterality: N/A;  . Urethrotomy Bilateral 04/22/2014    Procedure: CYSTOSCOPY/URETHROTOMY;  Surgeon: Leighton Ruff, MD;  Location: [facility];  Service:  General;  Laterality: Bilateral;  . Muscle flap closure N/A 04/22/2014    Procedure: VRAM FLAP;  Surgeon: Irene Limbo, MD;  Location: [facility];  Service: Plastics;  Laterality: N/A;  . Portacath placement Left 06/24/2014    Procedure: INSERTION PORT-A-CATH LEFT SUBCLAVIAN;  Surgeon: Leighton Ruff, MD;  Location: [facility];  Service: General;  Laterality: Left;    SOCIAL HISTORY: Social History   Social History  . Marital Status: Single    Spouse Name: N/A  . Number of Children: 0  . Years of Education: N/A   Occupational History  .  [place] History Main Topics  . Smoking status: Never Smoker   . Smokeless tobacco: Never Used  . Alcohol Use: No     Comment: RARE  . Drug Use: No  . Sexual Activity: Not on file   Other Topics Concern  . Not on file   Social History Narrative   Full time. Single. Does not regularly exercise.    Lives alone, his mother lives close by  FAMILY HISTORY: Family History  Problem Relation Age of Onset  . Diabetes type II Mother   . Breast cancer Mother 12     survivor  . Hypertension Mother   . Coronary artery disease Father   Paternal GM had utrine cancer, no other history of malignancy   ALLERGIES:  is allergic to sulfa antibiotics.  MEDICATIONS:  Current Outpatient Prescriptions  Medication Sig Dispense Refill  . aspirin (ASPIR-81) 81 MG EC tablet Take 81 mg by mouth at bedtime.     Marland Kitchen atorvastatin (LIPITOR) 80 MG tablet Take 1 tablet (80 mg total) by mouth every evening. 90 tablet 4  . Blood Glucose Monitoring Suppl (FREESTYLE LITE) DEVI     . citalopram (CELEXA) 20 MG tablet Take by mouth.    . dexamethasone (DECADRON) 4 MG tablet Take 1 tablet (4 mg total) by mouth daily. (Patient not taking: Reported on 11/17/2015) 10 tablet 1  . efavirenz-emtrictabine-tenofovir (ATRIPLA) 600-200-300 MG per tablet Take 1 tablet by mouth at bedtime.     . Empagliflozin-Linagliptin (GLYXAMBI) 25-5 MG TABS Take 1 tablet by mouth  daily.    Marland Kitchen gabapentin (NEURONTIN) 100 MG capsule Take 2 capsules (200 mg total) by mouth 3 (three) times daily. 180 capsule 1  . glimepiride (AMARYL) 2 MG tablet Take 4 mg by mouth daily with breakfast.     . HYDROcodone-acetaminophen (NORCO/VICODIN) 5-325 MG tablet Take 1 tablet by mouth every 8 (eight) hours as needed for moderate pain. 60 tablet 0  . HYDROcodone-homatropine (HYCODAN) 5-1.5 MG/5ML syrup Take 5 mLs by mouth every 6 (six) hours as needed for cough. (Patient not taking: Reported on 11/17/2015) 120 mL 0  . losartan (COZAAR) 100 MG tablet Take 1 tablet (100 mg total) by mouth daily. 90 tablet 4  . metFORMIN (GLUCOPHAGE) 1000 MG tablet Take 1,000 mg by mouth 2 (two) times daily.      . ondansetron (ZOFRAN) 8 MG tablet Take 1 tablet (8 mg total) by mouth 2 (two) times daily as needed for nausea or vomiting. Take 8 mg by mouth 2 times daily for 3 days and as needed for nausea -  Start on third  day after chemo . 30 tablet 1  . prochlorperazine (COMPAZINE) 10 MG tablet Take 1 tablet (10 mg total) by mouth every 6 (six) hours as needed for nausea or vomiting. 30 tablet 1  . traZODone (DESYREL) 100 MG tablet Take 1 tablet (100 mg total) by mouth at bedtime. 60 tablet 1   No current facility-administered medications for this visit.   Facility-Administered Medications Ordered in Other Visits  Medication Dose Route Frequency Provider Last Rate Last Dose  . heparin lock flush 100 unit/mL  500 Units Intravenous Once Truitt Merle, MD      . sodium chloride 0.9 % injection 10 mL  10 mL Intravenous PRN Truitt Merle, MD        REVIEW OF SYSTEMS:   Constitutional: Denies fevers, chills or abnormal night sweats Eyes: Denies blurriness of vision, double vision or watery eyes Ears, nose, mouth, throat, and face: Denies mucositis or sore throat Respiratory: Denies cough, dyspnea or wheezes Cardiovascular: Denies palpitation, chest discomfort or lower extremity swelling Gastrointestinal:  Denies nausea,  heartburn or change in bowel habits, (+) colostomy bag Skin: Mild skin pigmentation from his previous rash. Lymphatics: Denies new lymphadenopathy or easy bruising Neurological:Denies numbness, tingling or new weaknesses Behavioral/Psych: Mood is stable, no new changes  All other systems were reviewed with the patient and are negative.  PHYSICAL EXAMINATION: ECOG PERFORMANCE STATUS: 1 BP 132/70 mmHg  Pulse 87  Temp(Src) 98.2 F (36.8 C) (Oral)  Resp 18  Ht 6' (1.829 m)  Wt 189 lb 11.2 oz (86.047 kg)  BMI 25.72 kg/m2  SpO2 98%  GENERAL:alert, no distress, sits in a wheelchair comfortably SKIN: skin color, texture, turgor are normal. A few skin pigmentation from his previous rash EYES: normal, conjunctiva are pink and non-injected, sclera clear OROPHARYNX:no exudate, no erythema and lips, buccal mucosa, and tongue normal  NECK: supple, thyroid normal size, non-tender, without nodularity LYMPH:  no palpable lymphadenopathy in the cervical, axillary or inguinal LUNGS: clear to auscultation and percussion with normal breathing effort HEART: regular rate & rhythm and no murmurs and no lower extremity edema ABDOMEN:abdomen soft, non-tender and normal bowel sounds. (+) Colostomy bag. The perianal reconstruction site is completely healed.  Musculoskeletal:no cyanosis of digits and no clubbing  PSYCH: alert & oriented x 3 with fluent speech NEURO: no focal motor/sensory deficits  LABORATORY DATA:  I have reviewed the data as listed CBC Latest Ref Rng 01/10/2016 12/20/2015 12/01/2015  WBC 4.0 - 10.3 10e3/uL 3.7(L) 3.4(L) 1.4(L)  Hemoglobin 13.0 - 17.1 g/dL 9.7(L) 10.4(L) 10.0(L)  Hematocrit 38.4 - 49.9 % 29.2(L) 31.4(L) 29.6(L)  Platelets 140 - 400 10e3/uL 119(L) 209 243    CMP Latest Ref Rng 01/10/2016 12/20/2015 12/01/2015  Glucose 70 - 140 mg/dl 138 72 137  BUN 7.0 - 26.0 mg/dL 20.2 19.2 20.4  Creatinine 0.7 - 1.3 mg/dL 1.1 1.1 1.0  Sodium 136 - 145 mEq/L 137 138 136  Potassium 3.5 - 5.1  mEq/L 4.3 4.5 4.3  CO2 22 - 29 mEq/L 22 22 24   Calcium 8.4 - 10.4 mg/dL 8.8 9.4 8.8  Total Protein 6.4 - 8.3 g/dL 7.6 7.7 7.3  Total Bilirubin 0.20 - 1.20 mg/dL 0.30 <0.30 <0.30  Alkaline Phos 40 - 150 U/L 151(H) 157(H) 152(H)  AST 5 - 34 U/L 23 25 27   ALT 0 - 55 U/L 25 28 29    ANC 1.9 today   PATHOLOGY REPORT 04/22/2014 Diagnosis 1. Soft tissue, biopsy, right pelvic sidewall; r/o malignancy - FIBROADIPOSE TISSUE, NO EVIDENCE OF MALIGNANCY. 2. Colon, segmental resection for tumor, rectum,sigmoid and anus - RECURRENT INVASIVE POORLY DIFFERENTIATED SQUAMOUS CELL CARCINOMA, INVADING THROUGH THE MUSCULARIS PROPRIA, INTO PERICOLONIC FATTY TISSUE, EXTENDING TO THE DEEP INKED MARGIN. - PERINEURAL INVASION AND ANGIOLYMPHATIC PRESENT. - SEVEN OF SEVENTEEN LYMPH NODES, POSITIVE FOR METASTATIC CARCINOMA (7/17) Microscopic Comment 2. Anus Specimen: Anus, rectum, sigmoid Procedure: Segmental resection Tumor site: Distal ends of specimen Specimen integrity: Intact Invasive tumor: Maximum size: At least 2.5 cm Histologic type(s): Invasive squamous cell carcinoma Histologic grade and differentiation: G2-3, moderately to poorly differentiated Microscopic extension of invasive tumor: Invading through the muscularis propria into pericolonic fatty tissue involving adjacent skeletal muscle tissue Lymph-Vascular invasion: Present Peri-neural invasion: Present Resection margins: Deep margin is positive for tumor Treatment effect (neo-adjuvant therapy): Present, minimally Lymph nodes: number examined 17; number positive: 7 Pathologic Staging: ypT2, yN1 (R1) Ancillary studies: N/A  Diagnosis 03/15/2015 Bone, biopsy, caudal left side of the sacrum - METASTATIC SQUAMOUS CELL CARCINOMA, SEE COMMENT. Microscopic Comment Biopsies demonstrate extensive involvement by metastatic focally keratinizing, well to moderately differentiated squamous cell carcinoma. The previous anorectal resection  demonstrating invasive squamous cell carcinoma is noted [id number]). (CRR:ecj 03/16/2015)  RADIOGRAPHIC STUDIES: I have personally reviewed the radiological images as listed and agreed with the findings in the report.  CT chest PE protocol 12/16/2015 IMPRESSION: 1. Chronic bronchitic type  interstitial lung changes and tree-in-bud appearance. This has slightly improved since the prior CTA from 07/14/2015. 2. Slight interval enlargement of an ill-defined sub solid nodular lesion in the right middle lobe. This is more likely scarring or atelectasis but attention on future scans is suggested. 3. Stable hepatic metastatic lesions. No new disease. 4. No abdominal/pelvic lymphadenopathy or evidence of recurrent tumor in the pelvis. 5. Stable mixed lytic and sclerotic metastatic bone disease involving the pelvis. No new osseous metastatic disease.    ASSESSMENT & PLAN:  55 years old male with past medical history of anal squamous cell carcinoma, status post surgical resection and concurrent chemoradiation in 2009, now has local recurrence status post APR and  reconstruction.ypT2, yN1 (R1), unfortunately his deep surgical margin was positive, and 7 out of 17 lymph nodes were positive also. No distant metastasis on the preop PET scan. His medical history is also complicated by diabetes, coronary artery disease status post stent placement, and HIV on treatment.   1. Local recurrence of anal squamous cell carcinoma, with positive surgical margines, biopsy proven sacral bone mets on 03/15/2015, lung mets in 09/2015 -His CT chest in 09/2015 showed bilateral lung metastasis -He knows this is terminal and incurable, his prognosis is extremely poor, giving he has had multiple lines of therapy. -He started weekly carbo and Taxol, tolerated well so far, he has developed mild peripheral neuropathy -he has significant clinical improvement since he started chemotherapy, off oxygen, cough and dyspnea near  resolved, performance status much improved -I reviewed his restaging CT scan images from 12/16/2015 in person with patient. The infiltrative lesions in the lungs are likely metastatic cancer, which have significantly improved. His liver lesions have been there for years, unlikely metastatic disease. -I have changed his carbotaxol to every 3 weeks with dose reduction, he tolerated well, mild neuropathy is stable. -Lab results reviewed with patient, adequate for treatment, we'll proceed cycle 4 chemotherapy today  -Plan to repeat CT or PET scan after cycle 5 or 6 of chemotherapy  2. Cough, dyspnea and hypoxia -Secondary to his underlying lung metastasis, much improved since he started chemotherapy -he is off oxygen,cough and dyspnea minimal now.  3. Peripheral neuropathy, G1 -improved lately -He is diabetic, has baseline mild neuropathy -continue Neurontin  -Continue close monitoring  4. Sacral pain -secondary to his sacral bone metastasis, status post palliative radiation -He will continue oxycodone or hydrocodone as needed for pain. -He tried tramadol before, it did not work well -I suggest him to use Tylenol as needed.   5. Diabetes -controlled recently  -continue metformin and glimepiride, follow up with his endocrinologist   6. HIV  -Well controlled. Continue his treatment . His recent CD4 count was normal.   7. Hypertension and coronary artery disease  -He will continue follow-up with his primary care physician and cardiologist. Continue medications.   8. depression -worse since disease progression, not suicidal.  9. CODE STATUS -Has a long conversation about goals of care and CODE STATUS. Patient is agreeable with DO NOT RESUSCITATE and DO NOT INTUBATE -I encouraged him to call us if he has worsening short of breast, and we can see him in our symptom management clinic, instead of going to emergency room.   Plan -lab reviewed,adequate for treatment, will proceed C4  carboplatin and Taxol today, slight decreased couple doses due to some cytopenia, continue every 3 weeks -return to clinic in 3 weeks for next cycle treatment  All questions were answered. The patient knows to call the  clinic with any problems, questions or concerns.   I spent 25 minutes counseling the patient face to face. The total time spent in the appointment was 30 minutes and more than 50% was on counseling.     Truitt Merle, MD 01/10/2016

## 2016-01-10 NOTE — Telephone Encounter (Signed)
Gave pt cal & avs °

## 2016-01-10 NOTE — Patient Instructions (Signed)
[facility] Discharge Instructions for Patients Receiving Chemotherapy  Today you received the following chemotherapy agents taxol/carboplatin  To help prevent nausea and vomiting after your treatment, we encourage you to take your nausea medication as directed   If you develop nausea and vomiting that is not controlled by your nausea medication, call the clinic.   BELOW ARE SYMPTOMS THAT SHOULD BE REPORTED IMMEDIATELY:  *FEVER GREATER THAN 100.5 F  *CHILLS WITH OR WITHOUT FEVER  NAUSEA AND VOMITING THAT IS NOT CONTROLLED WITH YOUR NAUSEA MEDICATION  *UNUSUAL SHORTNESS OF BREATH  *UNUSUAL BRUISING OR BLEEDING  TENDERNESS IN MOUTH AND THROAT WITH OR WITHOUT PRESENCE OF ULCERS  *URINARY PROBLEMS  *BOWEL PROBLEMS  UNUSUAL RASH Items with * indicate a potential emergency and should be followed up as soon as possible.  Feel free to call the clinic you have any questions or concerns. The clinic phone number is [phone].  

## 2016-01-31 ENCOUNTER — Encounter: Payer: Self-pay | Admitting: *Deleted

## 2016-01-31 ENCOUNTER — Encounter: Payer: Self-pay | Admitting: Hematology

## 2016-01-31 ENCOUNTER — Telehealth: Payer: Self-pay | Admitting: Hematology

## 2016-01-31 ENCOUNTER — Ambulatory Visit (HOSPITAL_BASED_OUTPATIENT_CLINIC_OR_DEPARTMENT_OTHER): Payer: Managed Care, Other (non HMO) | Admitting: Hematology

## 2016-01-31 ENCOUNTER — Other Ambulatory Visit (HOSPITAL_BASED_OUTPATIENT_CLINIC_OR_DEPARTMENT_OTHER): Payer: Managed Care, Other (non HMO)

## 2016-01-31 ENCOUNTER — Ambulatory Visit (HOSPITAL_BASED_OUTPATIENT_CLINIC_OR_DEPARTMENT_OTHER): Payer: Managed Care, Other (non HMO)

## 2016-01-31 ENCOUNTER — Ambulatory Visit: Payer: Managed Care, Other (non HMO)

## 2016-01-31 VITALS — BP 128/77 | HR 74 | Temp 98.6°F | Resp 18 | Ht 72.0 in | Wt 191.8 lb

## 2016-01-31 DIAGNOSIS — C7951 Secondary malignant neoplasm of bone: Secondary | ICD-10-CM | POA: Diagnosis not present

## 2016-01-31 DIAGNOSIS — Z794 Long term (current) use of insulin: Secondary | ICD-10-CM

## 2016-01-31 DIAGNOSIS — E1165 Type 2 diabetes mellitus with hyperglycemia: Secondary | ICD-10-CM | POA: Diagnosis not present

## 2016-01-31 DIAGNOSIS — I10 Essential (primary) hypertension: Secondary | ICD-10-CM

## 2016-01-31 DIAGNOSIS — I2510 Atherosclerotic heart disease of native coronary artery without angina pectoris: Secondary | ICD-10-CM

## 2016-01-31 DIAGNOSIS — B20 Human immunodeficiency virus [HIV] disease: Secondary | ICD-10-CM

## 2016-01-31 DIAGNOSIS — F32A Depression, unspecified: Secondary | ICD-10-CM

## 2016-01-31 DIAGNOSIS — C211 Malignant neoplasm of anal canal: Secondary | ICD-10-CM

## 2016-01-31 DIAGNOSIS — C210 Malignant neoplasm of anus, unspecified: Secondary | ICD-10-CM

## 2016-01-31 DIAGNOSIS — IMO0001 Reserved for inherently not codable concepts without codable children: Secondary | ICD-10-CM

## 2016-01-31 DIAGNOSIS — Z5111 Encounter for antineoplastic chemotherapy: Secondary | ICD-10-CM

## 2016-01-31 DIAGNOSIS — Z21 Asymptomatic human immunodeficiency virus [HIV] infection status: Secondary | ICD-10-CM

## 2016-01-31 DIAGNOSIS — F329 Major depressive disorder, single episode, unspecified: Secondary | ICD-10-CM

## 2016-01-31 DIAGNOSIS — E119 Type 2 diabetes mellitus without complications: Secondary | ICD-10-CM

## 2016-01-31 LAB — CBC WITH DIFFERENTIAL/PLATELET
BASO%: 0.6 % (ref 0.0–2.0)
BASOS ABS: 0 10*3/uL (ref 0.0–0.1)
EOS%: 1.3 % (ref 0.0–7.0)
Eosinophils Absolute: 0 10*3/uL (ref 0.0–0.5)
HEMATOCRIT: 29.7 % — AB (ref 38.4–49.9)
HEMOGLOBIN: 10 g/dL — AB (ref 13.0–17.1)
LYMPH#: 0.7 10*3/uL — AB (ref 0.9–3.3)
LYMPH%: 30.9 % (ref 14.0–49.0)
MCH: 35 pg — AB (ref 27.2–33.4)
MCHC: 33.8 g/dL (ref 32.0–36.0)
MCV: 103.8 fL — ABNORMAL HIGH (ref 79.3–98.0)
MONO#: 0.7 10*3/uL (ref 0.1–0.9)
MONO%: 30.2 % — ABNORMAL HIGH (ref 0.0–14.0)
NEUT#: 0.9 10*3/uL — ABNORMAL LOW (ref 1.5–6.5)
NEUT%: 37 % — ABNORMAL LOW (ref 39.0–75.0)
Platelets: 241 10*3/uL (ref 140–400)
RBC: 2.86 10*6/uL — ABNORMAL LOW (ref 4.20–5.82)
RDW: 20.5 % — AB (ref 11.0–14.6)
WBC: 2.4 10*3/uL — ABNORMAL LOW (ref 4.0–10.3)

## 2016-01-31 LAB — COMPREHENSIVE METABOLIC PANEL
ALBUMIN: 3.9 g/dL (ref 3.5–5.0)
ALK PHOS: 145 U/L (ref 40–150)
ALT: 36 U/L (ref 0–55)
AST: 30 U/L (ref 5–34)
Anion Gap: 8 mEq/L (ref 3–11)
BUN: 19.4 mg/dL (ref 7.0–26.0)
CALCIUM: 9.6 mg/dL (ref 8.4–10.4)
CO2: 23 mEq/L (ref 22–29)
CREATININE: 1 mg/dL (ref 0.7–1.3)
Chloride: 107 mEq/L (ref 98–109)
EGFR: 80 mL/min/{1.73_m2} — ABNORMAL LOW (ref >90)
Glucose: 75 mg/dl (ref 70–140)
Potassium: 4.5 mEq/L (ref 3.5–5.1)
Sodium: 138 mEq/L (ref 136–145)
TOTAL PROTEIN: 8 g/dL (ref 6.4–8.3)

## 2016-01-31 MED ORDER — DIPHENHYDRAMINE HCL 50 MG/ML IJ SOLN
50.0000 mg | Freq: Once | INTRAMUSCULAR | Status: AC
  Administered 2016-01-31: 50 mg via INTRAVENOUS

## 2016-01-31 MED ORDER — SODIUM CHLORIDE 0.9 % IV SOLN
20.0000 mg | Freq: Once | INTRAVENOUS | Status: AC
  Administered 2016-01-31: 20 mg via INTRAVENOUS
  Filled 2016-01-31: qty 2

## 2016-01-31 MED ORDER — CARBOPLATIN CHEMO INJECTION 600 MG/60ML
503.2000 mg | Freq: Once | INTRAVENOUS | Status: AC
  Administered 2016-01-31: 500 mg via INTRAVENOUS
  Filled 2016-01-31: qty 50

## 2016-01-31 MED ORDER — FAMOTIDINE IN NACL 20-0.9 MG/50ML-% IV SOLN
INTRAVENOUS | Status: AC
  Filled 2016-01-31: qty 50

## 2016-01-31 MED ORDER — PEGFILGRASTIM 6 MG/0.6ML ~~LOC~~ PSKT: 6.0000 mg | PREFILLED_SYRINGE | Freq: Once | SUBCUTANEOUS | Status: DC

## 2016-01-31 MED ORDER — PALONOSETRON HCL INJECTION 0.25 MG/5ML
INTRAVENOUS | Status: AC
  Filled 2016-01-31: qty 5

## 2016-01-31 MED ORDER — PACLITAXEL CHEMO INJECTION 300 MG/50ML
145.0000 mg/m2 | Freq: Once | INTRAVENOUS | Status: AC
  Administered 2016-01-31: 300 mg via INTRAVENOUS
  Filled 2016-01-31: qty 50

## 2016-01-31 MED ORDER — HEPARIN SOD (PORK) LOCK FLUSH 100 UNIT/ML IV SOLN
500.0000 [IU] | Freq: Once | INTRAVENOUS | Status: AC | PRN
  Administered 2016-01-31: 500 [IU]
  Filled 2016-01-31: qty 5

## 2016-01-31 MED ORDER — DIPHENHYDRAMINE HCL 50 MG/ML IJ SOLN
INTRAMUSCULAR | Status: AC
  Filled 2016-01-31: qty 1

## 2016-01-31 MED ORDER — SODIUM CHLORIDE 0.9 % IJ SOLN
10.0000 mL | INTRAMUSCULAR | Status: DC | PRN
  Administered 2016-01-31: 10 mL via INTRAVENOUS
  Filled 2016-01-31: qty 10

## 2016-01-31 MED ORDER — SODIUM CHLORIDE 0.9 % IV SOLN
Freq: Once | INTRAVENOUS | Status: AC
  Administered 2016-01-31: 11:00:00 via INTRAVENOUS

## 2016-01-31 MED ORDER — SODIUM CHLORIDE 0.9% FLUSH
10.0000 mL | INTRAVENOUS | Status: DC | PRN
  Administered 2016-01-31: 10 mL
  Filled 2016-01-31: qty 10

## 2016-01-31 MED ORDER — FAMOTIDINE IN NACL 20-0.9 MG/50ML-% IV SOLN
20.0000 mg | Freq: Once | INTRAVENOUS | Status: AC
  Administered 2016-01-31: 20 mg via INTRAVENOUS

## 2016-01-31 MED ORDER — HYDROCODONE-ACETAMINOPHEN 5-325 MG PO TABS: 1.0000 | ORAL_TABLET | Freq: Three times a day (TID) | ORAL | 0 refills | Status: DC | PRN

## 2016-01-31 MED ORDER — PALONOSETRON HCL INJECTION 0.25 MG/5ML
0.2500 mg | Freq: Once | INTRAVENOUS | Status: AC
  Administered 2016-01-31: 0.25 mg via INTRAVENOUS

## 2016-01-31 NOTE — Telephone Encounter (Signed)
Gave patient avs report and appointments for August.  °

## 2016-01-31 NOTE — Patient Instructions (Signed)
[facility] Discharge Instructions for Patients Receiving Chemotherapy  Today you received the following chemotherapy agents taxol/carboplatin  To help prevent nausea and vomiting after your treatment, we encourage you to take your nausea medication as directed   If you develop nausea and vomiting that is not controlled by your nausea medication, call the clinic.   BELOW ARE SYMPTOMS THAT SHOULD BE REPORTED IMMEDIATELY:  *FEVER GREATER THAN 100.5 F  *CHILLS WITH OR WITHOUT FEVER  NAUSEA AND VOMITING THAT IS NOT CONTROLLED WITH YOUR NAUSEA MEDICATION  *UNUSUAL SHORTNESS OF BREATH  *UNUSUAL BRUISING OR BLEEDING  TENDERNESS IN MOUTH AND THROAT WITH OR WITHOUT PRESENCE OF ULCERS  *URINARY PROBLEMS  *BOWEL PROBLEMS  UNUSUAL RASH Items with * indicate a potential emergency and should be followed up as soon as possible.  Feel free to call the clinic you have any questions or concerns. The clinic phone number is [phone].  

## 2016-01-31 NOTE — Progress Notes (Signed)
[facility] NOTE  Patient Care Team: Leonel Ramsay, MD as PCP - General (Infectious Diseases) Minna Merritts, MD as Consulting Physician (Cardiology) Robert Bellow, MD (General Surgery) Leighton Ruff, MD as Consulting Physician (General Surgery) Irene Limbo, MD as Consulting Physician (Plastic Surgery) Truitt Merle, MD as Consulting Physician (Hematology) Festus Aloe, MD as Consulting Physician (Urology) Gery Pray, MD as Consulting Physician (Radiation Oncology)  DIAGNOSIS Follow-up recurrent anal cancer    Primary cancer of anal canal ([place])   02/05/2011 Initial Diagnosis    Primary cancer of anal canal, T1N0M0, s/p surgical resection with positive margines. He received definitive dose RT 45 Gy over 5 weeks with 16Gy boosting and concurrent chemo with 5FU, treated at [facility] by Drs.  Geoffreyh and [employer].      11/23/2013 Relapse/Recurrence    local recurrence      04/21/2014 Surgery    abdominal perianal resection (APR) and VRAM flap closure of perineal wound on 04/21/14, surgical margins were positive for cancer      06/28/2014 - 09/25/2014 Chemotherapy    5-FU 1000mg /m2/day on D1-5, ciplatin 100mg /m2 on D2, every 28 days, s/p 4 cycles      10/11/2014 Progression    CT CAP showed new liver lesions and abd/pelc adenopathy, highly suspecious for progressive metastatic disease      11/02/2014 Imaging    abdomen MRI w wo contrast showed new small lesions in the right hepatic lobe with nonspecific enhancement, and enlarging nodes in the porta hepatis, metastatic disease not excluded      02/24/2015 Imaging    PET scan showed hypermetabolic bone mets in right sacrum, and small hypermetabolic retroperitoneal lymph nodes, no other abnormal metabolic activity within the liver or other place.     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     04/15/2015 - 08/18/2015 Chemotherapy     Nivolumab 240 mg every 2 weeks, stopped due to disease progression      07/07/2015 Imaging    PET scan showed interval progression of lytic metastasis involving the righ side of sacrum, pathologic fracture involving the right sacral wing. Improvement in retroperitoneal lymph node metastasis. 7 mm nodule in the left upper lobe lung, indeterminate.     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     10/20/2015 -  Chemotherapy    Weekly carboplatin AUC 2, Taxol 80 mg/m, 3 weeks on, one-week off, changed to every 3 weeks from cycle 3        OTHER RELATED ISSUES: 1. (+) HIV, on therapy with good control  2. CAD, s/p stent placement in [place]: Weekly carboplatin AUC 2, Taxol 80 mg/m, 3 weeks on, one-week off, started on 10/20/2015, changed to carboplatin AUC 4.0-4.5, Taxol 150mg /m, every 3 weeks, from 12/20/2015  INTERIM HISTORY: Ritwik returns for follow up and chemotherapy. He is doing very well overall. His neuropathy (Numbness and tingling ) on his feet is mild and stable, no issues with balance or gait. He denies any significant pain, except mild low back pain which has been chronic and stable. He denies any cough, dyspnea, or chest discomfort. He has been living independently, functions very well at  home.  MEDICAL HISTORY:  Past Medical History:  Diagnosis Date  . Anxiety   . At risk for sleep apnea    STOP-BANG= 4     SENT TO PCP 03-23-2014  . Coronary artery disease CARDIOLOGIST--  DR Rockey Situ --  LAST LOV 04-17-2013   S/P  PCI to mid and distal LAD and BM stent x1 to pLAD  . Depression   . ED (erectile dysfunction)   . History of carcinoma in situ of anal canal    2010  . History of rectal cancer    04/  2012--  invasive squamous cell carcinoma at 9 o'clock position, poorly differentiated s/p resection and  30 cylces radiation / chemo therapy for 2 weeks  . HIV positive ([place])   . Hyperlipidemia   . Hypertension   . PONV (postoperative nausea and vomiting)   . Primary cancer of anal canal ([place]) 05/18/2011  . Radiation 01/2011   definitive dose RT 45 Gy over 5 weeks with 16Gy boosting  . Radiation 09/01/15-09/22/15   right sacrum 35 Gy  . 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 S/p bare metal coronary artery stent    1998  pLAD  . Sigmoid diverticulosis   . Type 2 diabetes mellitus ([place])   . Wears glasses     SURGICAL HISTORY: Past Surgical History:  Procedure Laterality Date  . CARDIOVASCULAR STRESS TEST  04/ 2010   low risk study/  very small region of mildly reduced perfersion is mildly reversible dLAD territory  . COLONOSCOPY WITH PROPOFOL  05-28-2008  . CORONARY ANGIOPLASTY WITH STENT PLACEMENT  1998   [facility]   BM stent to pLAD and PCI to Mid and Distal LAD/  no other significant cad  . EUA W/ BX PERIANAL SKIN  06-08-2009  . EXCISION ANAL POLYP  2009  . INGUINAL HERNIA REPAIR Bilateral 1988  . LAPAROSCOPIC ASSISTED ABDOMINAL PERINEAL RESECTION N/A 04/22/2014   Procedure: LAPAROSCOPIC  ABDOMINAL PERINEAL RESECTION, ;  Surgeon: Leighton Ruff, MD;  Location: [facility];  Service: General;  Laterality: N/A;  . MASS EXCISION N/A 03/26/2014   Procedure: ANAL EXAM UNDER ANESTHESIA,EXCISIONAL BIOPSY OF ANAL MASS;  Surgeon: Leighton Ruff, MD;  Location: [facility];  Service: General;  Laterality: N/A;  . MUSCLE FLAP CLOSURE N/A 04/22/2014   Procedure: VRAM FLAP;  Surgeon: Irene Limbo, MD;  Location: [facility];  Service: Plastics;  Laterality: N/A;  . PORT-A-CATH PLACEMENT  02-21-2011   REMOVED  2013  . PORTACATH PLACEMENT Left 06/24/2014   Procedure: INSERTION PORT-A-CATH LEFT SUBCLAVIAN;  Surgeon: Leighton Ruff, MD;  Location: [facility];   Service: General;  Laterality: Left;  . RE-EXCISION ANAL SQUAMOUS CELL CARCINOMA  2010  . REMOVAL RECTAL MASS  01-08-2014  . RESECTION ANAL MASS  02-05-2011  . SEPTOPLASTY  1985  . SPHINCTEROTOMY  2009  . URETHROTOMY Bilateral 04/22/2014   Procedure: CYSTOSCOPY/URETHROTOMY;  Surgeon: Leighton Ruff, MD;  Location: [facility];  Service: General;  Laterality: Bilateral;    SOCIAL HISTORY: Social History   Social History  . Marital status: Single    Spouse name: N/A  . Number of children: 0  . Years of education: N/A   Occupational History  .  [place] History Main Topics  . Smoking status: Never Smoker  . Smokeless tobacco: Never Used  . Alcohol use No     Comment: RARE  . Drug use: No  . Sexual activity:  Not on file   Other Topics Concern  . Not on file   Social History Narrative   Full time. Single. Does not regularly exercise.    Lives alone, his mother lives close by    FAMILY HISTORY: Family History  Problem Relation Age of Onset  . Diabetes type II Mother   . Breast cancer Mother 59     survivor  . Hypertension Mother   . Coronary artery disease Father   Paternal GM had utrine cancer, no other history of malignancy   ALLERGIES:  is allergic to sulfa antibiotics.  MEDICATIONS:  Current Outpatient Prescriptions  Medication Sig Dispense Refill  . aspirin (ASPIR-81) 81 MG EC tablet Take 81 mg by mouth at bedtime.     Marland Kitchen atorvastatin (LIPITOR) 80 MG tablet Take 1 tablet (80 mg total) by mouth every evening. 90 tablet 4  . Blood Glucose Monitoring Suppl (FREESTYLE LITE) DEVI     . citalopram (CELEXA) 20 MG tablet Take by mouth.    . efavirenz-emtrictabine-tenofovir (ATRIPLA) 600-200-300 MG per tablet Take 1 tablet by mouth at bedtime.     . Empagliflozin-Linagliptin (GLYXAMBI) 25-5 MG TABS Take 1 tablet by mouth daily.    Marland Kitchen gabapentin (NEURONTIN) 100 MG capsule Take 2 capsules (200 mg total) by mouth 3 (three) times daily. 180 capsule 1  .  glimepiride (AMARYL) 2 MG tablet Take 2 mg by mouth daily with breakfast.     . HYDROcodone-acetaminophen (NORCO/VICODIN) 5-325 MG tablet Take 1 tablet by mouth every 8 (eight) hours as needed for moderate pain. 30 tablet 0  . losartan (COZAAR) 100 MG tablet Take 1 tablet (100 mg total) by mouth daily. 90 tablet 4  . metFORMIN (GLUCOPHAGE) 1000 MG tablet Take 1,000 mg by mouth 2 (two) times daily.      . ondansetron (ZOFRAN) 8 MG tablet Take 1 tablet (8 mg total) by mouth 2 (two) times daily as needed for nausea or vomiting. Take 8 mg by mouth 2 times daily for 3 days and as needed for nausea -  Start on third  day after chemo . 30 tablet 1  . prochlorperazine (COMPAZINE) 10 MG tablet Take 1 tablet (10 mg total) by mouth every 6 (six) hours as needed for nausea or vomiting. 30 tablet 1  . HYDROcodone-homatropine (HYCODAN) 5-1.5 MG/5ML syrup Take 5 mLs by mouth every 6 (six) hours as needed for cough. (Patient not taking: Reported on 11/17/2015) 120 mL 0  . traZODone (DESYREL) 100 MG tablet Take 1 tablet (100 mg total) by mouth at bedtime. (Patient not taking: Reported on 01/31/2016) 60 tablet 1   No current facility-administered medications for this visit.    Facility-Administered Medications Ordered in Other Visits  Medication Dose Route Frequency Provider Last Rate Last Dose  . heparin lock flush 100 unit/mL  500 Units Intravenous Once Truitt Merle, MD      . sodium chloride 0.9 % injection 10 mL  10 mL Intravenous PRN Truitt Merle, MD        REVIEW OF SYSTEMS:   Constitutional: Denies fevers, chills or abnormal night sweats Eyes: Denies blurriness of vision, double vision or watery eyes Ears, nose, mouth, throat, and face: Denies mucositis or sore throat Respiratory: Denies cough, dyspnea or wheezes Cardiovascular: Denies palpitation, chest discomfort or lower extremity swelling Gastrointestinal:  Denies nausea, heartburn or change in bowel habits, (+) colostomy bag Skin: Mild skin pigmentation from  his previous rash. Lymphatics: Denies new lymphadenopathy or easy bruising Neurological:Denies numbness, tingling or  new weaknesses Behavioral/Psych: Mood is stable, no new changes  All other systems were reviewed with the patient and are negative.  PHYSICAL EXAMINATION: ECOG PERFORMANCE STATUS: 1 BP 128/77 (BP Location: Left Arm, Patient Position: Sitting)   Pulse 74   Temp 98.6 F (37 C) (Oral)   Resp 18   Ht 6' (1.829 m)   Wt 191 lb 12.8 oz (87 kg)   SpO2 100%   BMI 26.01 kg/m   GENERAL:alert, no distress, sits in a wheelchair comfortably SKIN: skin color, texture, turgor are normal. A few skin pigmentation from his previous rash EYES: normal, conjunctiva are pink and non-injected, sclera clear OROPHARYNX:no exudate, no erythema and lips, buccal mucosa, and tongue normal  NECK: supple, thyroid normal size, non-tender, without nodularity LYMPH:  no palpable lymphadenopathy in the cervical, axillary or inguinal LUNGS: clear to auscultation and percussion with normal breathing effort HEART: regular rate & rhythm and no murmurs and no lower extremity edema ABDOMEN:abdomen soft, non-tender and normal bowel sounds. (+) Colostomy bag. The perianal reconstruction site is completely healed.  Musculoskeletal:no cyanosis of digits and no clubbing  PSYCH: alert & oriented x 3 with fluent speech NEURO: no focal motor/sensory deficits  LABORATORY DATA:  I have reviewed the data as listed CBC Latest Ref Rng & Units 01/31/2016 01/10/2016 12/20/2015  WBC 4.0 - 10.3 10e3/uL 2.4(L) 3.7(L) 3.4(L)  Hemoglobin 13.0 - 17.1 g/dL 10.0(L) 9.7(L) 10.4(L)  Hematocrit 38.4 - 49.9 % 29.7(L) 29.2(L) 31.4(L)  Platelets 140 - 400 10e3/uL 241 119(L) 209    CMP Latest Ref Rng & Units 01/31/2016 01/10/2016 12/20/2015  Glucose 70 - 140 mg/dl 75 138 72  BUN 7.0 - 26.0 mg/dL 19.4 20.2 19.2  Creatinine 0.7 - 1.3 mg/dL 1.0 1.1 1.1  Sodium 136 - 145 mEq/L 138 137 138  Potassium 3.5 - 5.1 mEq/L 4.5 4.3 4.5  Chloride 96  - 112 mEq/L - - -  CO2 22 - 29 mEq/L 23 22 22   Calcium 8.4 - 10.4 mg/dL 9.6 8.8 9.4  Total Protein 6.4 - 8.3 g/dL 8.0 7.6 7.7  Total Bilirubin 0.20 - 1.20 mg/dL <0.30 0.30 <0.30  Alkaline Phos 40 - 150 U/L 145 151(H) 157(H)  AST 5 - 34 U/L 30 23 25   ALT 0 - 55 U/L 36 25 28   ANC 0.9 today   PATHOLOGY REPORT 04/22/2014 Diagnosis 1. Soft tissue, biopsy, right pelvic sidewall; r/o malignancy - FIBROADIPOSE TISSUE, NO EVIDENCE OF MALIGNANCY. 2. Colon, segmental resection for tumor, rectum,sigmoid and anus - RECURRENT INVASIVE POORLY DIFFERENTIATED SQUAMOUS CELL CARCINOMA, INVADING THROUGH THE MUSCULARIS PROPRIA, INTO PERICOLONIC FATTY TISSUE, EXTENDING TO THE DEEP INKED MARGIN. - PERINEURAL INVASION AND ANGIOLYMPHATIC PRESENT. - SEVEN OF SEVENTEEN LYMPH NODES, POSITIVE FOR METASTATIC CARCINOMA (7/17) Microscopic Comment 2. Anus Specimen: Anus, rectum, sigmoid Procedure: Segmental resection Tumor site: Distal ends of specimen Specimen integrity: Intact Invasive tumor: Maximum size: At least 2.5 cm Histologic type(s): Invasive squamous cell carcinoma Histologic grade and differentiation: G2-3, moderately to poorly differentiated Microscopic extension of invasive tumor: Invading through the muscularis propria into pericolonic fatty tissue involving adjacent skeletal muscle tissue Lymph-Vascular invasion: Present Peri-neural invasion: Present Resection margins: Deep margin is positive for tumor Treatment effect (neo-adjuvant therapy): Present, minimally Lymph nodes: number examined 17; number positive: 7 Pathologic Staging: ypT2, yN1 (R1) Ancillary studies: N/A  Diagnosis 03/15/2015 Bone, biopsy, caudal left side of the sacrum - METASTATIC SQUAMOUS CELL CARCINOMA, SEE COMMENT. Microscopic Comment Biopsies demonstrate extensive involvement by metastatic focally keratinizing, well to moderately differentiated  squamous cell carcinoma. The previous anorectal resection demonstrating  invasive squamous cell carcinoma is noted [id number]). (CRR:ecj 03/16/2015)  RADIOGRAPHIC STUDIES: I have personally reviewed the radiological images as listed and agreed with the findings in the report.  CT chest PE protocol 12/16/2015 IMPRESSION: 1. Chronic bronchitic type interstitial lung changes and tree-in-bud appearance. This has slightly improved since the prior CTA from 07/14/2015. 2. Slight interval enlargement of an ill-defined sub solid nodular lesion in the right middle lobe. This is more likely scarring or atelectasis but attention on future scans is suggested. 3. Stable hepatic metastatic lesions. No new disease. 4. No abdominal/pelvic lymphadenopathy or evidence of recurrent tumor in the pelvis. 5. Stable mixed lytic and sclerotic metastatic bone disease involving the pelvis. No new osseous metastatic disease.    ASSESSMENT & PLAN:  55 years old male with past medical history of anal squamous cell carcinoma, status post surgical resection and concurrent chemoradiation in 2009, now has local recurrence status post APR and  reconstruction.ypT2, yN1 (R1), unfortunately his deep surgical margin was positive, and 7 out of 17 lymph nodes were positive also. No distant metastasis on the preop PET scan. His medical history is also complicated by diabetes, coronary artery disease status post stent placement, and HIV on treatment.   1. Local recurrence of anal squamous cell carcinoma, with positive surgical margines, biopsy proven sacral bone mets on 03/15/2015, lung mets in 09/2015 -His CT chest in 09/2015 showed bilateral lung infiltrative changes likely metastasis -He knows this is terminal and incurable, his prognosis is extremely poor, giving he has had multiple lines of therapy. -He started weekly carbo and Taxol, tolerated well so far, he has developed mild peripheral neuropathy -he has significant clinical improvement since he started chemotherapy, off oxygen, cough and  dyspnea has resolved, performance status much improved -I reviewed his restaging CT scan images from 12/16/2015 in person with patient. The infiltrative lesions in the lungs are likely metastatic cancer, which have significantly improved. His liver lesions have been there for years, unlikely metastatic disease. -I have changed his carbotaxol to every 3 weeks with dose reduction, he tolerated well, mild neuropathy is stable. -Lab results reviewed with patient, neutropenia with ANC 0.9, adequate for treatment, we'll proceed chemo today with Neulasta support -Plan to repeat PET scan after next cycle chemo. If scan showed continued improvement, I'll recommend stopping chemotherapy and surveillance.  2. Cough, dyspnea and hypoxia -Secondary to his underlying lung metastasis, now resolved since he started chemotherapy  3. Peripheral neuropathy, G1-2 -improved lately -He is diabetic, has baseline mild neuropathy -continue Neurontin  -Continue close monitoring  4. Sacral pain -secondary to his sacral bone metastasis, status post palliative radiation -He will continue oxycodone or hydrocodone as needed for pain. -He tried tramadol before, it did not work well -I suggest him to use Tylenol as needed.   5. Diabetes -controlled recently  -continue metformin and glimepiride, follow up with his endocrinologist   6. HIV  -Well controlled. Continue his treatment . His recent CD4 count was normal.   7. Hypertension and coronary artery disease  -He will continue follow-up with his primary care physician and cardiologist. Continue medications.   8. depression -worse since disease progression, not suicidal.  9. CODE STATUS -Has a long conversation about goals of care and CODE STATUS. Patient is agreeable with DO NOT RESUSCITATE and DO NOT INTUBATE -I encouraged him to call us if he has worsening short of breast, and we can see him in our symptom management clinic,  instead of going to emergency  room.   Plan -lab reviewed,adequate for treatment, will proceed carboplatin and Taxol today, with Neulasta on day 3 due to his neutropenia -return to clinic in 3 weeks for next cycle treatment, we'll repeat a PET after his next cycle chemotherapy.  All questions were answered. The patient knows to call the clinic with any problems, questions or concerns.   I spent 25 minutes counseling the patient face to face. The total time spent in the appointment was 30 minutes and more than 50% was on counseling.     Truitt Merle, MD 01/31/2016

## 2016-01-31 NOTE — Progress Notes (Signed)
Via covermymeds sent prior auth for on pro neulasta

## 2016-01-31 NOTE — Progress Notes (Signed)
Pt okay to proceed treatment with taxol/carbo today per Dr. Burr Medico note. Pt will go home on onpro today.

## 2016-01-31 NOTE — Progress Notes (Signed)
Oncology Nurse Navigator Documentation  Oncology Nurse Navigator Flowsheets 01/31/2016  Navigator Location [facility]  Navigator Encounter Type Treatment  Telephone -  Patient Visit Type MedOnc  Treatment Phase Active Tx--Taxol/Carbo  Barriers/Navigation Needs No barriers at this time;No Questions;No Needs  Interventions None required  Coordination of Care -  Support Groups/Services GI Support Group--reminded him of meeting date/time for August  Acuity -  Time Spent with Patient 68  Tells RN he is doing well at home. Drives himself to treatments and pain is improved. He stopped physical therapy after two sessions. Does not feel it is necessary at this time. No navigation needs at this time.

## 2016-02-02 ENCOUNTER — Ambulatory Visit (HOSPITAL_BASED_OUTPATIENT_CLINIC_OR_DEPARTMENT_OTHER): Payer: Managed Care, Other (non HMO)

## 2016-02-02 VITALS — BP 117/80 | HR 79 | Temp 97.5°F | Resp 17

## 2016-02-02 DIAGNOSIS — Z5189 Encounter for other specified aftercare: Secondary | ICD-10-CM | POA: Diagnosis not present

## 2016-02-02 DIAGNOSIS — C211 Malignant neoplasm of anal canal: Secondary | ICD-10-CM | POA: Diagnosis not present

## 2016-02-02 DIAGNOSIS — C7951 Secondary malignant neoplasm of bone: Secondary | ICD-10-CM | POA: Diagnosis not present

## 2016-02-02 MED ORDER — PEGFILGRASTIM INJECTION 6 MG/0.6ML ~~LOC~~
6.0000 mg | PREFILLED_SYRINGE | Freq: Once | SUBCUTANEOUS | Status: AC
  Administered 2016-02-02: 6 mg via SUBCUTANEOUS
  Filled 2016-02-02: qty 0.6

## 2016-02-21 ENCOUNTER — Ambulatory Visit (HOSPITAL_BASED_OUTPATIENT_CLINIC_OR_DEPARTMENT_OTHER): Payer: Managed Care, Other (non HMO)

## 2016-02-21 ENCOUNTER — Ambulatory Visit (HOSPITAL_BASED_OUTPATIENT_CLINIC_OR_DEPARTMENT_OTHER): Payer: Managed Care, Other (non HMO) | Admitting: Hematology

## 2016-02-21 ENCOUNTER — Encounter: Payer: Self-pay | Admitting: Hematology

## 2016-02-21 ENCOUNTER — Telehealth: Payer: Self-pay | Admitting: Hematology

## 2016-02-21 ENCOUNTER — Other Ambulatory Visit (HOSPITAL_BASED_OUTPATIENT_CLINIC_OR_DEPARTMENT_OTHER): Payer: Managed Care, Other (non HMO)

## 2016-02-21 ENCOUNTER — Ambulatory Visit: Payer: Managed Care, Other (non HMO)

## 2016-02-21 VITALS — BP 116/67 | HR 93 | Temp 98.7°F | Resp 18 | Ht 72.0 in | Wt 193.1 lb

## 2016-02-21 DIAGNOSIS — C211 Malignant neoplasm of anal canal: Secondary | ICD-10-CM

## 2016-02-21 DIAGNOSIS — Z5111 Encounter for antineoplastic chemotherapy: Secondary | ICD-10-CM | POA: Diagnosis not present

## 2016-02-21 DIAGNOSIS — Z21 Asymptomatic human immunodeficiency virus [HIV] infection status: Secondary | ICD-10-CM | POA: Diagnosis not present

## 2016-02-21 DIAGNOSIS — B20 Human immunodeficiency virus [HIV] disease: Secondary | ICD-10-CM

## 2016-02-21 DIAGNOSIS — F329 Major depressive disorder, single episode, unspecified: Secondary | ICD-10-CM

## 2016-02-21 DIAGNOSIS — I10 Essential (primary) hypertension: Secondary | ICD-10-CM | POA: Diagnosis not present

## 2016-02-21 DIAGNOSIS — E1165 Type 2 diabetes mellitus with hyperglycemia: Secondary | ICD-10-CM

## 2016-02-21 DIAGNOSIS — F32A Depression, unspecified: Secondary | ICD-10-CM

## 2016-02-21 DIAGNOSIS — IMO0001 Reserved for inherently not codable concepts without codable children: Secondary | ICD-10-CM

## 2016-02-21 DIAGNOSIS — Z794 Long term (current) use of insulin: Secondary | ICD-10-CM

## 2016-02-21 DIAGNOSIS — I2510 Atherosclerotic heart disease of native coronary artery without angina pectoris: Secondary | ICD-10-CM

## 2016-02-21 LAB — CBC WITH DIFFERENTIAL/PLATELET
BASO%: 0.7 % (ref 0.0–2.0)
BASOS ABS: 0 10*3/uL (ref 0.0–0.1)
EOS%: 0.7 % (ref 0.0–7.0)
Eosinophils Absolute: 0 10*3/uL (ref 0.0–0.5)
HEMATOCRIT: 28.6 % — AB (ref 38.4–49.9)
HGB: 9.5 g/dL — ABNORMAL LOW (ref 13.0–17.1)
LYMPH%: 14.1 % (ref 14.0–49.0)
MCH: 35.4 pg — AB (ref 27.2–33.4)
MCHC: 33.2 g/dL (ref 32.0–36.0)
MCV: 106.7 fL — ABNORMAL HIGH (ref 79.3–98.0)
MONO#: 1.4 10*3/uL — ABNORMAL HIGH (ref 0.1–0.9)
MONO%: 24.7 % — AB (ref 0.0–14.0)
NEUT#: 3.3 10*3/uL (ref 1.5–6.5)
NEUT%: 59.8 % (ref 39.0–75.0)
Platelets: 199 10*3/uL (ref 140–400)
RBC: 2.68 10*6/uL — ABNORMAL LOW (ref 4.20–5.82)
RDW: 16.8 % — ABNORMAL HIGH (ref 11.0–14.6)
WBC: 5.5 10*3/uL (ref 4.0–10.3)
lymph#: 0.8 10*3/uL — ABNORMAL LOW (ref 0.9–3.3)

## 2016-02-21 LAB — COMPREHENSIVE METABOLIC PANEL
ALT: 25 U/L (ref 0–55)
ANION GAP: 11 meq/L (ref 3–11)
AST: 25 U/L (ref 5–34)
Albumin: 3.8 g/dL (ref 3.5–5.0)
Alkaline Phosphatase: 154 U/L — ABNORMAL HIGH (ref 40–150)
BUN: 20.5 mg/dL (ref 7.0–26.0)
CALCIUM: 9.4 mg/dL (ref 8.4–10.4)
CHLORIDE: 104 meq/L (ref 98–109)
CO2: 22 mEq/L (ref 22–29)
Creatinine: 1.1 mg/dL (ref 0.7–1.3)
EGFR: 73 mL/min/{1.73_m2} — ABNORMAL LOW (ref >90)
Glucose: 126 mg/dl (ref 70–140)
POTASSIUM: 4.6 meq/L (ref 3.5–5.1)
Sodium: 137 mEq/L (ref 136–145)
Total Bilirubin: 0.38 mg/dL (ref 0.20–1.20)
Total Protein: 7.8 g/dL (ref 6.4–8.3)

## 2016-02-21 MED ORDER — CARBOPLATIN CHEMO INJECTION 600 MG/60ML
470.0000 mg | Freq: Once | INTRAVENOUS | Status: AC
  Administered 2016-02-21: 470 mg via INTRAVENOUS
  Filled 2016-02-21: qty 47

## 2016-02-21 MED ORDER — PEGFILGRASTIM 6 MG/0.6ML ~~LOC~~ PSKT
6.0000 mg | PREFILLED_SYRINGE | Freq: Once | SUBCUTANEOUS | Status: AC
  Administered 2016-02-21: 6 mg via SUBCUTANEOUS
  Filled 2016-02-21: qty 0.6

## 2016-02-21 MED ORDER — HEPARIN SOD (PORK) LOCK FLUSH 100 UNIT/ML IV SOLN
500.0000 [IU] | Freq: Once | INTRAVENOUS | Status: AC | PRN
  Administered 2016-02-21: 500 [IU]
  Filled 2016-02-21: qty 5

## 2016-02-21 MED ORDER — SODIUM CHLORIDE 0.9% FLUSH
10.0000 mL | INTRAVENOUS | Status: DC | PRN
  Administered 2016-02-21: 10 mL
  Filled 2016-02-21: qty 10

## 2016-02-21 MED ORDER — DIPHENHYDRAMINE HCL 50 MG/ML IJ SOLN
INTRAMUSCULAR | Status: AC
  Filled 2016-02-21: qty 1

## 2016-02-21 MED ORDER — SODIUM CHLORIDE 0.9 % IV SOLN
145.0000 mg/m2 | Freq: Once | INTRAVENOUS | Status: AC
  Administered 2016-02-21: 300 mg via INTRAVENOUS
  Filled 2016-02-21: qty 50

## 2016-02-21 MED ORDER — FAMOTIDINE IN NACL 20-0.9 MG/50ML-% IV SOLN
20.0000 mg | Freq: Once | INTRAVENOUS | Status: AC
  Administered 2016-02-21: 20 mg via INTRAVENOUS

## 2016-02-21 MED ORDER — SODIUM CHLORIDE 0.9 % IV SOLN
Freq: Once | INTRAVENOUS | Status: AC
  Administered 2016-02-21: 10:00:00 via INTRAVENOUS

## 2016-02-21 MED ORDER — SODIUM CHLORIDE 0.9 % IV SOLN
20.0000 mg | Freq: Once | INTRAVENOUS | Status: AC
  Administered 2016-02-21: 20 mg via INTRAVENOUS
  Filled 2016-02-21: qty 2

## 2016-02-21 MED ORDER — DIPHENHYDRAMINE HCL 50 MG/ML IJ SOLN
50.0000 mg | Freq: Once | INTRAMUSCULAR | Status: AC
  Administered 2016-02-21: 50 mg via INTRAVENOUS

## 2016-02-21 MED ORDER — PALONOSETRON HCL INJECTION 0.25 MG/5ML
0.2500 mg | Freq: Once | INTRAVENOUS | Status: AC
  Administered 2016-02-21: 0.25 mg via INTRAVENOUS

## 2016-02-21 MED ORDER — FAMOTIDINE IN NACL 20-0.9 MG/50ML-% IV SOLN
INTRAVENOUS | Status: AC
  Filled 2016-02-21: qty 50

## 2016-02-21 MED ORDER — PALONOSETRON HCL INJECTION 0.25 MG/5ML
INTRAVENOUS | Status: AC
  Filled 2016-02-21: qty 5

## 2016-02-21 NOTE — Patient Instructions (Signed)
[facility] Discharge Instructions for Patients Receiving Chemotherapy  Today you received the following chemotherapy agents Taxol and Carboplatin.  To help prevent nausea and vomiting after your treatment, we encourage you to take your nausea medication as prescribed.   If you develop nausea and vomiting that is not controlled by your nausea medication, call the clinic.   BELOW ARE SYMPTOMS THAT SHOULD BE REPORTED IMMEDIATELY:  *FEVER GREATER THAN 100.5 F  *CHILLS WITH OR WITHOUT FEVER  NAUSEA AND VOMITING THAT IS NOT CONTROLLED WITH YOUR NAUSEA MEDICATION  *UNUSUAL SHORTNESS OF BREATH  *UNUSUAL BRUISING OR BLEEDING  TENDERNESS IN MOUTH AND THROAT WITH OR WITHOUT PRESENCE OF ULCERS  *URINARY PROBLEMS  *BOWEL PROBLEMS  UNUSUAL RASH Items with * indicate a potential emergency and should be followed up as soon as possible.  Feel free to call the clinic you have any questions or concerns. The clinic phone number is [phone].  Please show the CHEMO ALERT CARD at check-in to the Emergency Department and triage nurse.   

## 2016-02-21 NOTE — Telephone Encounter (Signed)
Gave patient avs report and appointments for September and October. Central radiology will call re pet scan - patient aware.

## 2016-02-21 NOTE — Patient Instructions (Signed)

## 2016-02-21 NOTE — Progress Notes (Signed)
[place] NOTE  Patient Care Team: Leonel Ramsay, MD as PCP - General (Infectious Diseases) Minna Merritts, MD as Consulting Physician (Cardiology) Robert Bellow, MD (General Surgery) Leighton Ruff, MD as Consulting Physician (General Surgery) Irene Limbo, MD as Consulting Physician (Plastic Surgery) Truitt Merle, MD as Consulting Physician (Hematology) Festus Aloe, MD as Consulting Physician (Urology) Gery Pray, MD as Consulting Physician (Radiation Oncology)  DIAGNOSIS Follow-up recurrent anal cancer    Primary cancer of anal canal ([place])   02/05/2011 Initial Diagnosis    Primary cancer of anal canal, T1N0M0, s/p surgical resection with positive margines. He received definitive dose RT 45 Gy over 5 weeks with 16Gy boosting and concurrent chemo with 5FU, treated at [facility] by Drs.  Geoffreyh and [employer].       11/23/2013 Relapse/Recurrence    local recurrence       04/21/2014 Surgery    abdominal perianal resection (APR) and VRAM flap closure of perineal wound on 04/21/14, surgical margins were positive for cancer       06/28/2014 - 09/25/2014 Chemotherapy    5-FU 1000mg /m2/day on D1-5, ciplatin 100mg /m2 on D2, every 28 days, s/p 4 cycles       10/11/2014 Progression    CT CAP showed new liver lesions and abd/pelc adenopathy, highly suspecious for progressive metastatic disease       11/02/2014 Imaging    abdomen MRI w wo contrast showed new small lesions in the right hepatic lobe with nonspecific enhancement, and enlarging nodes in the porta hepatis, metastatic disease not excluded       02/24/2015 Imaging    PET scan showed hypermetabolic bone mets in right sacrum, and small hypermetabolic retroperitoneal lymph nodes, no other abnormal metabolic activity within the liver or other place.      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      04/15/2015 - 08/18/2015  Chemotherapy    Nivolumab 240 mg every 2 weeks, stopped due to disease progression       07/07/2015 Imaging    PET scan showed interval progression of lytic metastasis involving the righ side of sacrum, pathologic fracture involving the right sacral wing. Improvement in retroperitoneal lymph node metastasis. 7 mm nodule in the left upper lobe lung, indeterminate.      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      10/20/2015 -  Chemotherapy    Weekly carboplatin AUC 2, Taxol 80 mg/m, 3 weeks on, one-week off, changed to every 3 weeks from cycle 3         OTHER RELATED ISSUES: 1. (+) HIV, on therapy with good control  2. CAD, s/p stent placement in [place]: Weekly carboplatin AUC 2, Taxol 80 mg/m, 3 weeks on, one-week off, started on 10/20/2015, changed to carboplatin AUC 4.0-4.5, Taxol 150mg /m, every 3 weeks, from 12/20/2015  INTERIM HISTORY: Julian Palmer returns for follow up and chemotherapy. He is doing very well overall. He denies any cough, dyspnea, or chest discomfort. His previous sacral pain has much improved overall, he takes Tylenol or ibuprofen occasionally, and has not needed any no narcotics lately. His neuropathy on his feet are stable, no issues  with balancing. Mild tingling on his fingers, her hand function is normal. No other new complaints.  MEDICAL HISTORY:  Past Medical History:  Diagnosis Date  . Anxiety   . At risk for sleep apnea    STOP-BANG= 4     SENT TO PCP 03-23-2014  . Coronary artery disease CARDIOLOGIST--  DR Rockey Situ --  LAST LOV 04-17-2013   S/P  PCI to mid and distal LAD and BM stent x1 to pLAD  . Depression   . ED (erectile dysfunction)   . History of carcinoma in situ of  anal canal    2010  . History of rectal cancer    04/ 2012--  invasive squamous cell carcinoma at 9 o'clock position, poorly differentiated s/p resection and  30 cylces radiation / chemo therapy for 2 weeks  . HIV positive ([place])   . Hyperlipidemia   . Hypertension   . PONV (postoperative nausea and vomiting)   . Primary cancer of anal canal ([place]) 05/18/2011  . Radiation 01/2011   definitive dose RT 45 Gy over 5 weeks with 16Gy boosting  . Radiation 09/01/15-09/22/15   right sacrum 35 Gy  . 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 S/p bare metal coronary artery stent    1998  pLAD  . Sigmoid diverticulosis   . Type 2 diabetes mellitus ([place])   . Wears glasses     SURGICAL HISTORY: Past Surgical History:  Procedure Laterality Date  . CARDIOVASCULAR STRESS TEST  04/ 2010   low risk study/  very small region of mildly reduced perfersion is mildly reversible dLAD territory  . COLONOSCOPY WITH PROPOFOL  05-28-2008  . CORONARY ANGIOPLASTY WITH STENT PLACEMENT  1998   [facility]   BM stent to pLAD and PCI to Mid and Distal LAD/  no other significant cad  . EUA W/ BX PERIANAL SKIN  06-08-2009  . EXCISION ANAL POLYP  2009  . INGUINAL HERNIA REPAIR Bilateral 1988  . LAPAROSCOPIC ASSISTED ABDOMINAL PERINEAL RESECTION N/A 04/22/2014   Procedure: LAPAROSCOPIC  ABDOMINAL PERINEAL RESECTION, ;  Surgeon: Leighton Ruff, MD;  Location: [facility];  Service: General;  Laterality: N/A;  . MASS EXCISION N/A 03/26/2014   Procedure: ANAL EXAM UNDER ANESTHESIA,EXCISIONAL BIOPSY OF ANAL MASS;  Surgeon: Leighton Ruff, MD;  Location: [facility];  Service: General;  Laterality: N/A;  . MUSCLE FLAP CLOSURE N/A 04/22/2014   Procedure: VRAM FLAP;  Surgeon: Irene Limbo, MD;  Location: [facility];  Service: Plastics;  Laterality: N/A;  . PORT-A-CATH PLACEMENT  02-21-2011   REMOVED  2013  . PORTACATH PLACEMENT Left 06/24/2014   Procedure: INSERTION PORT-A-CATH LEFT  SUBCLAVIAN;  Surgeon: Leighton Ruff, MD;  Location: [facility];  Service: General;  Laterality: Left;  . RE-EXCISION ANAL SQUAMOUS CELL CARCINOMA  2010  . REMOVAL RECTAL MASS  01-08-2014  . RESECTION ANAL MASS  02-05-2011  . SEPTOPLASTY  1985  . SPHINCTEROTOMY  2009  . URETHROTOMY Bilateral 04/22/2014   Procedure: CYSTOSCOPY/URETHROTOMY;  Surgeon: Leighton Ruff, MD;  Location: [facility];  Service: General;  Laterality: Bilateral;    SOCIAL HISTORY: Social History   Social History  . Marital status: Single    Spouse name: N/A  . Number of children: 0  . Years of education: N/A   Occupational History  .  [place] History Main Topics  . Smoking status: Never Smoker  . Smokeless tobacco: Never Used  . Alcohol use No  Comment: RARE  . Drug use: No  . Sexual activity: Not on file   Other Topics Concern  . Not on file   Social History Narrative   Full time. Single. Does not regularly exercise.    Lives alone, his mother lives close by    FAMILY HISTORY: Family History  Problem Relation Age of Onset  . Diabetes type II Mother   . Breast cancer Mother 103     survivor  . Hypertension Mother   . Coronary artery disease Father   Paternal GM had utrine cancer, no other history of malignancy   ALLERGIES:  is allergic to sulfa antibiotics.  MEDICATIONS:  Current Outpatient Prescriptions  Medication Sig Dispense Refill  . aspirin (ASPIR-81) 81 MG EC tablet Take 81 mg by mouth at bedtime.     Marland Kitchen atorvastatin (LIPITOR) 80 MG tablet Take 1 tablet (80 mg total) by mouth every evening. 90 tablet 4  . Blood Glucose Monitoring Suppl (FREESTYLE LITE) DEVI     . citalopram (CELEXA) 20 MG tablet Take by mouth.    . efavirenz-emtrictabine-tenofovir (ATRIPLA) 600-200-300 MG per tablet Take 1 tablet by mouth at bedtime.     . Empagliflozin-Linagliptin (GLYXAMBI) 25-5 MG TABS Take 1 tablet by mouth daily.    Marland Kitchen gabapentin (NEURONTIN) 100 MG capsule Take 2 capsules (200 mg  total) by mouth 3 (three) times daily. 180 capsule 1  . glimepiride (AMARYL) 2 MG tablet Take 2 mg by mouth daily with breakfast.     . HYDROcodone-acetaminophen (NORCO/VICODIN) 5-325 MG tablet Take 1 tablet by mouth every 8 (eight) hours as needed for moderate pain. 30 tablet 0  . HYDROcodone-homatropine (HYCODAN) 5-1.5 MG/5ML syrup Take 5 mLs by mouth every 6 (six) hours as needed for cough. (Patient not taking: Reported on 11/17/2015) 120 mL 0  . losartan (COZAAR) 100 MG tablet Take 1 tablet (100 mg total) by mouth daily. 90 tablet 4  . metFORMIN (GLUCOPHAGE) 1000 MG tablet Take 1,000 mg by mouth 2 (two) times daily.      . ondansetron (ZOFRAN) 8 MG tablet Take 1 tablet (8 mg total) by mouth 2 (two) times daily as needed for nausea or vomiting. Take 8 mg by mouth 2 times daily for 3 days and as needed for nausea -  Start on third  day after chemo . 30 tablet 1  . prochlorperazine (COMPAZINE) 10 MG tablet Take 1 tablet (10 mg total) by mouth every 6 (six) hours as needed for nausea or vomiting. 30 tablet 1  . traZODone (DESYREL) 100 MG tablet Take 1 tablet (100 mg total) by mouth at bedtime. (Patient not taking: Reported on 01/31/2016) 60 tablet 1   No current facility-administered medications for this visit.    Facility-Administered Medications Ordered in Other Visits  Medication Dose Route Frequency Provider Last Rate Last Dose  . heparin lock flush 100 unit/mL  500 Units Intravenous Once Truitt Merle, MD      . sodium chloride 0.9 % injection 10 mL  10 mL Intravenous PRN Truitt Merle, MD        REVIEW OF SYSTEMS:   Constitutional: Denies fevers, chills or abnormal night sweats Eyes: Denies blurriness of vision, double vision or watery eyes Ears, nose, mouth, throat, and face: Denies mucositis or sore throat Respiratory: Denies cough, dyspnea or wheezes Cardiovascular: Denies palpitation, chest discomfort or lower extremity swelling Gastrointestinal:  Denies nausea, heartburn or change in bowel  habits, (+) colostomy bag Skin: Mild skin pigmentation from his previous rash.  Lymphatics: Denies new lymphadenopathy or easy bruising Neurological:Denies numbness, tingling or new weaknesses Behavioral/Psych: Mood is stable, no new changes  All other systems were reviewed with the patient and are negative.  PHYSICAL EXAMINATION: ECOG PERFORMANCE STATUS: 1 BP 116/67 (BP Location: Left Arm, Patient Position: Sitting)   Pulse 93   Temp 98.7 F (37.1 C) (Oral)   Resp 18   Ht 6' (1.829 m)   Wt 193 lb 1.6 oz (87.6 kg)   SpO2 99%   BMI 26.19 kg/m   GENERAL:alert, no distress, sits in a wheelchair comfortably SKIN: skin color, texture, turgor are normal. A few skin pigmentation from his previous rash EYES: normal, conjunctiva are pink and non-injected, sclera clear OROPHARYNX:no exudate, no erythema and lips, buccal mucosa, and tongue normal  NECK: supple, thyroid normal size, non-tender, without nodularity LYMPH:  no palpable lymphadenopathy in the cervical, axillary or inguinal LUNGS: clear to auscultation and percussion with normal breathing effort HEART: regular rate & rhythm and no murmurs and no lower extremity edema ABDOMEN:abdomen soft, non-tender and normal bowel sounds. (+) Colostomy bag. The perianal reconstruction site is completely healed.  Musculoskeletal:no cyanosis of digits and no clubbing  PSYCH: alert & oriented x 3 with fluent speech NEURO: no focal motor/sensory deficits  LABORATORY DATA:  I have reviewed the data as listed CBC Latest Ref Rng & Units 02/21/2016 01/31/2016 01/10/2016  WBC 4.0 - 10.3 10e3/uL 5.5 2.4(L) 3.7(L)  Hemoglobin 13.0 - 17.1 g/dL 9.5(L) 10.0(L) 9.7(L)  Hematocrit 38.4 - 49.9 % 28.6(L) 29.7(L) 29.2(L)  Platelets 140 - 400 10e3/uL 199 241 119(L)    CMP Latest Ref Rng & Units 02/21/2016 01/31/2016 01/10/2016  Glucose 70 - 140 mg/dl 126 75 138  BUN 7.0 - 26.0 mg/dL 20.5 19.4 20.2  Creatinine 0.7 - 1.3 mg/dL 1.1 1.0 1.1  Sodium 136 - 145 mEq/L 137  138 137  Potassium 3.5 - 5.1 mEq/L 4.6 4.5 4.3  Chloride 96 - 112 mEq/L - - -  CO2 22 - 29 mEq/L 22 23 22   Calcium 8.4 - 10.4 mg/dL 9.4 9.6 8.8  Total Protein 6.4 - 8.3 g/dL 7.8 8.0 7.6  Total Bilirubin 0.20 - 1.20 mg/dL 0.38 <0.30 0.30  Alkaline Phos 40 - 150 U/L 154(H) 145 151(H)  AST 5 - 34 U/L 25 30 23   ALT 0 - 55 U/L 25 36 25   ANC 0.9 today   PATHOLOGY REPORT 04/22/2014 Diagnosis 1. Soft tissue, biopsy, right pelvic sidewall; r/o malignancy - FIBROADIPOSE TISSUE, NO EVIDENCE OF MALIGNANCY. 2. Colon, segmental resection for tumor, rectum,sigmoid and anus - RECURRENT INVASIVE POORLY DIFFERENTIATED SQUAMOUS CELL CARCINOMA, INVADING THROUGH THE MUSCULARIS PROPRIA, INTO PERICOLONIC FATTY TISSUE, EXTENDING TO THE DEEP INKED MARGIN. - PERINEURAL INVASION AND ANGIOLYMPHATIC PRESENT. - SEVEN OF SEVENTEEN LYMPH NODES, POSITIVE FOR METASTATIC CARCINOMA (7/17) Microscopic Comment 2. Anus Specimen: Anus, rectum, sigmoid Procedure: Segmental resection Tumor site: Distal ends of specimen Specimen integrity: Intact Invasive tumor: Maximum size: At least 2.5 cm Histologic type(s): Invasive squamous cell carcinoma Histologic grade and differentiation: G2-3, moderately to poorly differentiated Microscopic extension of invasive tumor: Invading through the muscularis propria into pericolonic fatty tissue involving adjacent skeletal muscle tissue Lymph-Vascular invasion: Present Peri-neural invasion: Present Resection margins: Deep margin is positive for tumor Treatment effect (neo-adjuvant therapy): Present, minimally Lymph nodes: number examined 17; number positive: 7 Pathologic Staging: ypT2, yN1 (R1) Ancillary studies: N/A  Diagnosis 03/15/2015 Bone, biopsy, caudal left side of the sacrum - METASTATIC SQUAMOUS CELL CARCINOMA, SEE COMMENT. Microscopic Comment Biopsies  demonstrate extensive involvement by metastatic focally keratinizing, well to moderately differentiated squamous  cell carcinoma. The previous anorectal resection demonstrating invasive squamous cell carcinoma is noted [id number]). (CRR:ecj 03/16/2015)  RADIOGRAPHIC STUDIES: I have personally reviewed the radiological images as listed and agreed with the findings in the report.  CT chest PE protocol 12/16/2015 IMPRESSION: 1. Chronic bronchitic type interstitial lung changes and tree-in-bud appearance. This has slightly improved since the prior CTA from 07/14/2015. 2. Slight interval enlargement of an ill-defined sub solid nodular lesion in the right middle lobe. This is more likely scarring or atelectasis but attention on future scans is suggested. 3. Stable hepatic metastatic lesions. No new disease. 4. No abdominal/pelvic lymphadenopathy or evidence of recurrent tumor in the pelvis. 5. Stable mixed lytic and sclerotic metastatic bone disease involving the pelvis. No new osseous metastatic disease.    ASSESSMENT & PLAN:  55 years old male with past medical history of anal squamous cell carcinoma, status post surgical resection and concurrent chemoradiation in 2009, now has local recurrence status post APR and  reconstruction.ypT2, yN1 (R1), unfortunately his deep surgical margin was positive, and 7 out of 17 lymph nodes were positive also. No distant metastasis on the preop PET scan. His medical history is also complicated by diabetes, coronary artery disease status post stent placement, and HIV on treatment.   1. Local recurrence of anal squamous cell carcinoma, with positive surgical margines, biopsy proven sacral bone mets on 03/15/2015, lung mets in 09/2015 -His CT chest in 09/2015 showed bilateral lung infiltrative changes likely metastasis -He knows this is terminal and incurable, his prognosis is extremely poor, giving he has had multiple lines of therapy. -He started weekly carbo and Taxol, tolerated well so far, he has developed mild peripheral neuropathy -he has significant clinical  improvement since he started chemotherapy, off oxygen, cough and dyspnea has resolved, performance status much improved -I reviewed his restaging CT scan images from 12/16/2015 in person with patient. The infiltrative lesions in the lungs are likely metastatic cancer, which have significantly improved. His liver lesions have been there for years, unlikely metastatic disease. -I have changed his carbotaxol to every 3 weeks with dose reduction, he tolerated well, mild neuropathy is stable. -Lab results reviewed with patient, mild anemia, adequate for treatment, we'll proceed chemo today with Neulasta support -Plan to repeat PET scan after this cycle chemo. If scan showed continued improvement, I'll recommend stopping chemotherapy and surveillance.  2. Peripheral neuropathy, G1 -improved lately -He is diabetic, has baseline mild neuropathy -continue Neurontin  -Continue close monitoring  4. Sacral pain -secondary to his sacral bone metastasis, status post palliative radiation, much improved lately -He has not needed oxycodone or hydrocodone lately  -He tried tramadol before, it did not work well -I suggest him to use Tylenol as needed.   5. Diabetes -controlled recently  -continue metformin and glimepiride, follow up with his endocrinologist   6. HIV  -Well controlled. Continue his treatment . His recent CD4 count was normal.   7. Hypertension and coronary artery disease  -He will continue follow-up with his primary care physician and cardiologist. Continue medications.   8. depression -worse since disease progression, not suicidal. -much better lately   9. CODE STATUS -Has a long conversation about goals of care and CODE STATUS. Patient is agreeable with DO NOT RESUSCITATE and DO NOT INTUBATE -I encouraged him to call us if he has worsening short of breast, and we can see him in our symptom management clinic, instead of going  to emergency room.   Plan -lab reviewed,adequate for  treatment, will proceed carboplatin and Taxol today, with Neulasta on day 3 due to his neutropenia -return to clinic in 4-5 weeks with restaging PET one week before at [facility] .  All questions were answered. The patient knows to call the clinic with any problems, questions or concerns.   I spent 20 minutes counseling the patient face to face. The total time spent in the appointment was 25 minutes and more than 50% was on counseling.     Truitt Merle, MD 02/21/2016

## 2016-02-29 ENCOUNTER — Other Ambulatory Visit: Payer: Self-pay | Admitting: Hematology

## 2016-03-19 ENCOUNTER — Encounter: Payer: Self-pay | Admitting: Cardiovascular Disease

## 2016-03-19 ENCOUNTER — Ambulatory Visit (INDEPENDENT_AMBULATORY_CARE_PROVIDER_SITE_OTHER): Payer: Managed Care, Other (non HMO) | Admitting: Cardiovascular Disease

## 2016-03-19 ENCOUNTER — Other Ambulatory Visit: Payer: Self-pay | Admitting: Medical Oncology

## 2016-03-19 VITALS — BP 116/78 | HR 69 | Ht 70.0 in | Wt 195.5 lb

## 2016-03-19 DIAGNOSIS — C211 Malignant neoplasm of anal canal: Secondary | ICD-10-CM

## 2016-03-19 DIAGNOSIS — R0600 Dyspnea, unspecified: Secondary | ICD-10-CM

## 2016-03-19 DIAGNOSIS — I2510 Atherosclerotic heart disease of native coronary artery without angina pectoris: Secondary | ICD-10-CM | POA: Diagnosis not present

## 2016-03-19 DIAGNOSIS — IMO0002 Reserved for concepts with insufficient information to code with codable children: Secondary | ICD-10-CM

## 2016-03-19 DIAGNOSIS — I10 Essential (primary) hypertension: Secondary | ICD-10-CM | POA: Diagnosis not present

## 2016-03-19 DIAGNOSIS — E785 Hyperlipidemia, unspecified: Secondary | ICD-10-CM | POA: Diagnosis not present

## 2016-03-19 DIAGNOSIS — E1165 Type 2 diabetes mellitus with hyperglycemia: Secondary | ICD-10-CM

## 2016-03-19 DIAGNOSIS — E1159 Type 2 diabetes mellitus with other circulatory complications: Secondary | ICD-10-CM

## 2016-03-19 DIAGNOSIS — Z794 Long term (current) use of insulin: Secondary | ICD-10-CM

## 2016-03-19 NOTE — Progress Notes (Signed)
Cardiology Office Note  Date:  03/19/2016   ID:  Julian Palmer, DOB 29-Apr-1961, MRN [id number]  PCP:  Leonel Ramsay, MD   Chief Complaint  Patient presents with  . other    6 month f/u c/o sugar drop. Meds reviewed verbally with pt.    HPI:  Julian Palmer is a very pleasant 55 year old gentleman with a history of coronary artery disease, stent placed to his proximal LAD in 1998 with angioplasty to the mid and distal LAD, diabetes, hypertension, history of HIV, rectal cancer with metastases, status post chemotherapy and radiation,  who presents for routine followup of his coronary artery disease    History of rectal cancer, s/p resection on February 05 2011, followed by radiation of more than 30 cycles, chemotherapy for 2 weeks.  Recurrence of his cancer in late 2015 surgical evaluation 03/26/2014 that did not show clear margins unfortunately . He is undergone radical surgical resection, rectus flap reconstruction, now with ostomy  He completed treatment with 5-FU and cisplatin, last chemotherapy April 2016, and radiation  several spots on his liver,  Currently on suppressive chemotherapy, followed in [place]  Last chemo Feb 21 2016 Needs a pet scan Quit his job after he had severe shortness of breath April 2017 Had to get oxygen in April 2017 Doing better since 10/2015 HBA1C 7.1 Overall feels he is stable, some gait instability Weight slowly trending back up after dramatic weight loss in April  Currently living with his parents  Previously  seen by psychologist, psychiatry, is on antidepressants . Difficulty sleeping, on medications for sleep   On today's visit had low sugar, required crackers, soda, sugar tested and was 70 after food  EKG on today's visit shows normal sinus rhythm, rate 69 bpm, no significant ST or T-wave changes  Other past medical history Cardiac catheterization report from 1998 shows a totally occluded LAD after the first septal branch. Otherwise no  other significant coronary artery disease. Balloon angioplasty was performed of the proximal mid LAD, stent placed to the proximal LAD with a 3.5 x 15 mm Multi-Link stent. Distal LAD was then angioplastied   Stress test in April 2010 was low risk scan, where he achieved 10 METS, with a very small region of mildly reduced perfusion is mildly reversible in the distal LAD territory   PMH:   has a past medical history of Anxiety; At risk for sleep apnea; Coronary artery disease (CARDIOLOGIST--  DR Rockey Situ --  LAST LOV 04-17-2013); Depression; ED (erectile dysfunction); History of carcinoma in situ of anal canal; History of rectal cancer; HIV positive ([place]); Hyperlipidemia; Hypertension; PONV (postoperative nausea and vomiting); Primary cancer of anal canal ([place]) (05/18/2011); Radiation (01/2011); Radiation (09/01/15-09/22/15); Recurrent squamous cell carcinoma of anus ([facility]); S/p bare metal coronary artery stent; Sigmoid diverticulosis; Type 2 diabetes mellitus ([place]); and Wears glasses.  PSH:    Past Surgical History:  Procedure Laterality Date  . CARDIOVASCULAR STRESS TEST  04/ 2010   low risk study/  very small region of mildly reduced perfersion is mildly reversible dLAD territory  . COLONOSCOPY WITH PROPOFOL  05-28-2008  . CORONARY ANGIOPLASTY WITH STENT PLACEMENT  1998   [facility]   BM stent to pLAD and PCI to Mid and Distal LAD/  no other significant cad  . EUA W/ BX PERIANAL SKIN  06-08-2009  . EXCISION ANAL POLYP  2009  . INGUINAL HERNIA REPAIR Bilateral 1988  . LAPAROSCOPIC ASSISTED ABDOMINAL PERINEAL RESECTION N/A 04/22/2014   Procedure: LAPAROSCOPIC  ABDOMINAL PERINEAL  RESECTION, ;  Surgeon: Leighton Ruff, MD;  Location: [facility];  Service: General;  Laterality: N/A;  . MASS EXCISION N/A 03/26/2014   Procedure: ANAL EXAM UNDER ANESTHESIA,EXCISIONAL BIOPSY OF ANAL MASS;  Surgeon: Leighton Ruff, MD;  Location: [facility];  Service: General;  Laterality: N/A;  . MUSCLE FLAP CLOSURE N/A  04/22/2014   Procedure: VRAM FLAP;  Surgeon: Irene Limbo, MD;  Location: [facility];  Service: Plastics;  Laterality: N/A;  . PORT-A-CATH PLACEMENT  02-21-2011   REMOVED  2013  . PORTACATH PLACEMENT Left 06/24/2014   Procedure: INSERTION PORT-A-CATH LEFT SUBCLAVIAN;  Surgeon: Leighton Ruff, MD;  Location: [facility];  Service: General;  Laterality: Left;  . RE-EXCISION ANAL SQUAMOUS CELL CARCINOMA  2010  . REMOVAL RECTAL MASS  01-08-2014  . RESECTION ANAL MASS  02-05-2011  . SEPTOPLASTY  1985  . SPHINCTEROTOMY  2009  . URETHROTOMY Bilateral 04/22/2014   Procedure: CYSTOSCOPY/URETHROTOMY;  Surgeon: Leighton Ruff, MD;  Location: [facility];  Service: General;  Laterality: Bilateral;    Current Outpatient Prescriptions  Medication Sig Dispense Refill  . aspirin (ASPIR-81) 81 MG EC tablet Take 81 mg by mouth at bedtime.     Marland Kitchen atorvastatin (LIPITOR) 80 MG tablet Take 1 tablet (80 mg total) by mouth every evening. 90 tablet 4  . Blood Glucose Monitoring Suppl (FREESTYLE LITE) DEVI     . citalopram (CELEXA) 20 MG tablet Take by mouth.    . efavirenz-emtrictabine-tenofovir (ATRIPLA) 600-200-300 MG per tablet Take 1 tablet by mouth at bedtime.     . Empagliflozin-Linagliptin (GLYXAMBI) 25-5 MG TABS Take 1 tablet by mouth daily.    Marland Kitchen gabapentin (NEURONTIN) 100 MG capsule TAKE 2 CAPSULES (200 MG TOTAL) BY MOUTH 3 (THREE) TIMES DAILY. (Patient taking differently: Take 300 mg by mouth at bedtime. ) 180 capsule 1  . glimepiride (AMARYL) 2 MG tablet Take 2 mg by mouth daily with breakfast.     . HYDROcodone-acetaminophen (NORCO/VICODIN) 5-325 MG tablet Take 1 tablet by mouth every 8 (eight) hours as needed for moderate pain. 30 tablet 0  . HYDROcodone-homatropine (HYCODAN) 5-1.5 MG/5ML syrup Take 5 mLs by mouth every 6 (six) hours as needed for cough. 120 mL 0  . losartan (COZAAR) 100 MG tablet Take 1 tablet (100 mg total) by mouth daily. 90 tablet 4  . metFORMIN (GLUCOPHAGE) 1000 MG tablet Take 1,000 mg by  mouth 2 (two) times daily.      . ondansetron (ZOFRAN) 8 MG tablet Take 1 tablet (8 mg total) by mouth 2 (two) times daily as needed for nausea or vomiting. Take 8 mg by mouth 2 times daily for 3 days and as needed for nausea -  Start on third  day after chemo . 30 tablet 1  . prochlorperazine (COMPAZINE) 10 MG tablet Take 1 tablet (10 mg total) by mouth every 6 (six) hours as needed for nausea or vomiting. 30 tablet 1   No current facility-administered medications for this visit.    Facility-Administered Medications Ordered in Other Visits  Medication Dose Route Frequency Provider Last Rate Last Dose  . heparin lock flush 100 unit/mL  500 Units Intravenous Once Truitt Merle, MD      . sodium chloride 0.9 % injection 10 mL  10 mL Intravenous PRN Truitt Merle, MD         Allergies:   Ace inhibitors and Sulfa antibiotics   Social History:  The patient  reports that he has never smoked. He has never used smokeless  tobacco. He reports that he does not drink alcohol or use drugs.   Family History:   family history includes Breast cancer in his mother; Coronary artery disease in his father; Diabetes type II in his mother; Hypertension in his mother; Uterine cancer in his paternal grandmother.    Review of Systems: Review of Systems  Constitutional: Negative.   Respiratory: Negative.   Cardiovascular: Negative.   Gastrointestinal: Negative.   Musculoskeletal: Negative.   Neurological: Negative.   Psychiatric/Behavioral: Negative.   All other systems reviewed and are negative.    PHYSICAL EXAM: VS:  BP 116/78 (BP Location: Left Arm, Patient Position: Sitting, Cuff Size: Normal)   Pulse 69   Ht 5\' 10"  (1.778 m)   Wt 195 lb 8 oz (88.7 kg)   BMI 28.05 kg/m  , BMI Body mass index is 28.05 kg/m. GEN: Well nourished, well developed, in no acute distress,Appears pale, lost his hair   HEENT: normal  Neck: no JVD, carotid bruits, or masses Cardiac: RRR; no murmurs, rubs, or gallops,no edema   Respiratory:  clear to auscultation bilaterally, normal work of breathing GI: soft, nontender, nondistended, + BS MS: no deformity or atrophy  Skin: warm and dry, no rash Neuro:  Strength and sensation are intact Psych: euthymic mood, full affect    Recent Labs: 08/04/2015: TSH 1.423 02/21/2016: ALT 25; BUN 20.5; Creatinine 1.1; HGB 9.5; Platelets 199; Potassium 4.6; Sodium 137    Lipid Panel Lab Results  Component Value Date   CHOL 148 08/16/2015   HDL 47 08/16/2015   LDLCALC 53 08/16/2015   TRIG 241 (H) 08/16/2015      Wt Readings from Last 3 Encounters:  03/19/16 195 lb 8 oz (88.7 kg)  02/21/16 193 lb 1.6 oz (87.6 kg)  01/31/16 191 lb 12.8 oz (87 kg)       ASSESSMENT AND PLAN:  Atherosclerosis of native coronary artery of native heart without angina pectoris - Plan: EKG 12-Lead Currently with no symptoms of angina. No further workup at this time. Continue current medication regimen.  HYPERTENSION, BENIGN Blood pressure is well controlled on today's visit. No changes made to the medications. [id number] systolic on my check today  Hyperlipidemia Cholesterol is at goal on the current lipid regimen. No changes to the medications were made.  Primary cancer of anal canal ([place]) On suppressive therapy, has metastases Scheduled to have PET scan  Uncontrolled type 2 diabetes mellitus with other circulatory complication, with long-term current use of insulin ([place]) Dramatic weight loss in April and May 2017, slowly gaining weight back Recommended he avoid carbohydrates He had low sugar today, may need to cut back on his glimepiride even further if he continues to have hypoglycemic episodes as he did this morning in the office  Dyspnea Shortness of breath resolved had severe symptoms April and May 2017 Off oxygen    Total encounter time more than 15 minutes  Greater than 50% was spent in counseling and coordination of care with the patient    Disposition:   F/U  6  months   Orders Placed This Encounter  Procedures  . EKG 12-Lead     Signed, Esmond Plants, M.D., Ph.D. 03/19/2016  [place], [place]

## 2016-03-19 NOTE — Patient Instructions (Signed)

## 2016-03-21 ENCOUNTER — Other Ambulatory Visit (HOSPITAL_BASED_OUTPATIENT_CLINIC_OR_DEPARTMENT_OTHER): Payer: Managed Care, Other (non HMO)

## 2016-03-21 DIAGNOSIS — C211 Malignant neoplasm of anal canal: Secondary | ICD-10-CM | POA: Diagnosis not present

## 2016-03-21 LAB — COMPREHENSIVE METABOLIC PANEL
ALBUMIN: 3.8 g/dL (ref 3.5–5.0)
ALK PHOS: 172 U/L — AB (ref 40–150)
ALT: 24 U/L (ref 0–55)
ANION GAP: 10 meq/L (ref 3–11)
AST: 21 U/L (ref 5–34)
BUN: 19.2 mg/dL (ref 7.0–26.0)
CALCIUM: 9.1 mg/dL (ref 8.4–10.4)
CO2: 23 mEq/L (ref 22–29)
CREATININE: 1.2 mg/dL (ref 0.7–1.3)
Chloride: 105 mEq/L (ref 98–109)
EGFR: 70 mL/min/{1.73_m2} — AB (ref >90)
Glucose: 136 mg/dl (ref 70–140)
Potassium: 5 mEq/L (ref 3.5–5.1)
SODIUM: 138 meq/L (ref 136–145)
TOTAL PROTEIN: 7.9 g/dL (ref 6.4–8.3)

## 2016-03-21 LAB — CBC WITH DIFFERENTIAL/PLATELET
BASO%: 0.5 % (ref 0.0–2.0)
Basophils Absolute: 0 10*3/uL (ref 0.0–0.1)
EOS ABS: 0.3 10*3/uL (ref 0.0–0.5)
EOS%: 5.6 % (ref 0.0–7.0)
HEMATOCRIT: 32.3 % — AB (ref 38.4–49.9)
HEMOGLOBIN: 10.8 g/dL — AB (ref 13.0–17.1)
LYMPH#: 0.8 10*3/uL — AB (ref 0.9–3.3)
LYMPH%: 14.5 % (ref 14.0–49.0)
MCH: 35.9 pg — ABNORMAL HIGH (ref 27.2–33.4)
MCHC: 33.5 g/dL (ref 32.0–36.0)
MCV: 107.2 fL — AB (ref 79.3–98.0)
MONO#: 0.8 10*3/uL (ref 0.1–0.9)
MONO%: 14.6 % — ABNORMAL HIGH (ref 0.0–14.0)
NEUT%: 64.8 % (ref 39.0–75.0)
NEUTROS ABS: 3.5 10*3/uL (ref 1.5–6.5)
PLATELETS: 170 10*3/uL (ref 140–400)
RBC: 3.01 10*6/uL — ABNORMAL LOW (ref 4.20–5.82)
RDW: 14.5 % (ref 11.0–14.6)
WBC: 5.4 10*3/uL (ref 4.0–10.3)

## 2016-03-23 ENCOUNTER — Encounter
Admission: RE | Admit: 2016-03-23 | Discharge: 2016-03-23 | Disposition: A | Discharge status: Home / Self Care | Payer: Managed Care, Other (non HMO) | Source: Ambulatory Visit | Attending: Hematology | Admitting: Hematology

## 2016-03-23 DIAGNOSIS — C211 Malignant neoplasm of anal canal: Secondary | ICD-10-CM | POA: Insufficient documentation

## 2016-03-23 LAB — GLUCOSE, CAPILLARY: GLUCOSE-CAPILLARY: 116 mg/dL — AB (ref 65–99)

## 2016-03-23 MED ORDER — FLUDEOXYGLUCOSE F - 18 (FDG) INJECTION
11.9200 | Freq: Once | INTRAVENOUS | Status: AC | PRN
  Administered 2016-03-23: 11.92 via INTRAVENOUS

## 2016-03-28 ENCOUNTER — Ambulatory Visit (HOSPITAL_BASED_OUTPATIENT_CLINIC_OR_DEPARTMENT_OTHER): Payer: 59

## 2016-03-28 ENCOUNTER — Other Ambulatory Visit: Payer: Self-pay | Admitting: *Deleted

## 2016-03-28 ENCOUNTER — Telehealth: Payer: Self-pay | Admitting: Hematology

## 2016-03-28 ENCOUNTER — Encounter: Payer: Self-pay | Admitting: Hematology

## 2016-03-28 ENCOUNTER — Ambulatory Visit (HOSPITAL_BASED_OUTPATIENT_CLINIC_OR_DEPARTMENT_OTHER): Payer: 59 | Admitting: Hematology

## 2016-03-28 VITALS — BP 133/77 | HR 80 | Temp 98.6°F | Resp 18 | Ht 70.0 in | Wt 195.8 lb

## 2016-03-28 DIAGNOSIS — C210 Malignant neoplasm of anus, unspecified: Secondary | ICD-10-CM

## 2016-03-28 DIAGNOSIS — Z23 Encounter for immunization: Secondary | ICD-10-CM | POA: Diagnosis not present

## 2016-03-28 DIAGNOSIS — I2510 Atherosclerotic heart disease of native coronary artery without angina pectoris: Secondary | ICD-10-CM

## 2016-03-28 DIAGNOSIS — F32A Depression, unspecified: Secondary | ICD-10-CM

## 2016-03-28 DIAGNOSIS — B20 Human immunodeficiency virus [HIV] disease: Secondary | ICD-10-CM

## 2016-03-28 DIAGNOSIS — IMO0002 Reserved for concepts with insufficient information to code with codable children: Secondary | ICD-10-CM

## 2016-03-28 DIAGNOSIS — F329 Major depressive disorder, single episode, unspecified: Secondary | ICD-10-CM | POA: Diagnosis not present

## 2016-03-28 DIAGNOSIS — E1159 Type 2 diabetes mellitus with other circulatory complications: Secondary | ICD-10-CM

## 2016-03-28 DIAGNOSIS — C7951 Secondary malignant neoplasm of bone: Secondary | ICD-10-CM | POA: Diagnosis not present

## 2016-03-28 DIAGNOSIS — I10 Essential (primary) hypertension: Secondary | ICD-10-CM

## 2016-03-28 DIAGNOSIS — C211 Malignant neoplasm of anal canal: Secondary | ICD-10-CM

## 2016-03-28 DIAGNOSIS — Z794 Long term (current) use of insulin: Secondary | ICD-10-CM

## 2016-03-28 DIAGNOSIS — E1165 Type 2 diabetes mellitus with hyperglycemia: Secondary | ICD-10-CM

## 2016-03-28 MED ORDER — HEPARIN SOD (PORK) LOCK FLUSH 100 UNIT/ML IV SOLN
500.0000 [IU] | Freq: Once | INTRAVENOUS | Status: AC | PRN
  Administered 2016-03-28: 500 [IU] via INTRAVENOUS
  Filled 2016-03-28: qty 5

## 2016-03-28 MED ORDER — INFLUENZA VAC SPLIT QUAD 0.5 ML IM SUSY
0.5000 mL | PREFILLED_SYRINGE | Freq: Once | INTRAMUSCULAR | Status: AC
  Administered 2016-03-28: 0.5 mL via INTRAMUSCULAR
  Filled 2016-03-28: qty 0.5

## 2016-03-28 MED ORDER — SODIUM CHLORIDE 0.9 % IJ SOLN
10.0000 mL | INTRAMUSCULAR | Status: DC | PRN
  Administered 2016-03-28: 10 mL via INTRAVENOUS
  Filled 2016-03-28: qty 10

## 2016-03-28 NOTE — Progress Notes (Signed)
[place] NOTE  Patient Care Team: Leonel Ramsay, MD as PCP - General (Infectious Diseases) Minna Merritts, MD as Consulting Physician (Cardiology) Robert Bellow, MD (General Surgery) Leighton Ruff, MD as Consulting Physician (General Surgery) Irene Limbo, MD as Consulting Physician (Plastic Surgery) Truitt Merle, MD as Consulting Physician (Hematology) Festus Aloe, MD as Consulting Physician (Urology) Gery Pray, MD as Consulting Physician (Radiation Oncology)  DIAGNOSIS Follow-up recurrent anal cancer    Primary cancer of anal canal ([place])   02/05/2011 Initial Diagnosis    Primary cancer of anal canal, T1N0M0, s/p surgical resection with positive margines. He received definitive dose RT 45 Gy over 5 weeks with 16Gy boosting and concurrent chemo with 5FU, treated at [facility] by Drs.  Geoffreyh and [employer].       11/23/2013 Relapse/Recurrence    local recurrence       04/21/2014 Surgery    abdominal perianal resection (APR) and VRAM flap closure of perineal wound on 04/21/14, surgical margins were positive for cancer       06/28/2014 - 09/25/2014 Chemotherapy    5-FU 1000mg /m2/day on D1-5, ciplatin 100mg /m2 on D2, every 28 days, s/p 4 cycles       10/11/2014 Progression    CT CAP showed new liver lesions and abd/pelc adenopathy, highly suspecious for progressive metastatic disease       11/02/2014 Imaging    abdomen MRI w wo contrast showed new small lesions in the right hepatic lobe with nonspecific enhancement, and enlarging nodes in the porta hepatis, metastatic disease not excluded       02/24/2015 Imaging    PET scan showed hypermetabolic bone mets in right sacrum, and small hypermetabolic retroperitoneal lymph nodes, no other abnormal metabolic activity within the liver or other place.      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      04/15/2015 - 08/18/2015  Chemotherapy    Nivolumab 240 mg every 2 weeks, stopped due to disease progression       07/07/2015 Imaging    PET scan showed interval progression of lytic metastasis involving the righ side of sacrum, pathologic fracture involving the right sacral wing. Improvement in retroperitoneal lymph node metastasis. 7 mm nodule in the left upper lobe lung, indeterminate.      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      10/20/2015 -  Chemotherapy    Weekly carboplatin AUC 2, Taxol 80 mg/m, 3 weeks on, one-week off, changed to every 3 weeks from cycle 3         OTHER RELATED ISSUES: 1. (+) HIV, on therapy with good control  2. CAD, s/p stent placement in [address]: observation   INTERIM HISTORY: Julian Palmer returns for follow up and reviewing his restaging PET. He is doing very well overall, his sacrum pain is minimal now, he has not taken pain medications lately. He denies any dyspnea, cough or chest discomfort. He has good appetite and energy level, he found this very well at home. His skin some weight back, denies any other new symptoms.  MEDICAL HISTORY:  Past Medical History:  Diagnosis Date  . Anxiety   .  At risk for sleep apnea    STOP-BANG= 4     SENT TO PCP 03-23-2014  . Coronary artery disease CARDIOLOGIST--  DR Rockey Situ --  LAST LOV 04-17-2013   S/P  PCI to mid and distal LAD and BM stent x1 to pLAD  . Depression   . ED (erectile dysfunction)   . History of carcinoma in situ of anal canal    2010  . History of rectal cancer    04/ 2012--  invasive squamous cell carcinoma at 9 o'clock position, poorly differentiated s/p resection and  30 cylces radiation / chemo therapy for 2 weeks  . HIV positive ([place])    . Hyperlipidemia   . Hypertension   . PONV (postoperative nausea and vomiting)   . Primary cancer of anal canal ([place]) 05/18/2011  . Radiation 01/2011   definitive dose RT 45 Gy over 5 weeks with 16Gy boosting  . Radiation 09/01/15-09/22/15   right sacrum 35 Gy  . 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 S/p bare metal coronary artery stent    1998  pLAD  . Sigmoid diverticulosis   . Type 2 diabetes mellitus ([place])   . Wears glasses     SURGICAL HISTORY: Past Surgical History:  Procedure Laterality Date  . CARDIOVASCULAR STRESS TEST  04/ 2010   low risk study/  very small region of mildly reduced perfersion is mildly reversible dLAD territory  . COLONOSCOPY WITH PROPOFOL  05-28-2008  . CORONARY ANGIOPLASTY WITH STENT PLACEMENT  1998   [facility]   BM stent to pLAD and PCI to Mid and Distal LAD/  no other significant cad  . EUA W/ BX PERIANAL SKIN  06-08-2009  . EXCISION ANAL POLYP  2009  . INGUINAL HERNIA REPAIR Bilateral 1988  . LAPAROSCOPIC ASSISTED ABDOMINAL PERINEAL RESECTION N/A 04/22/2014   Procedure: LAPAROSCOPIC  ABDOMINAL PERINEAL RESECTION, ;  Surgeon: Leighton Ruff, MD;  Location: [facility];  Service: General;  Laterality: N/A;  . MASS EXCISION N/A 03/26/2014   Procedure: ANAL EXAM UNDER ANESTHESIA,EXCISIONAL BIOPSY OF ANAL MASS;  Surgeon: Leighton Ruff, MD;  Location: [facility];  Service: General;  Laterality: N/A;  . MUSCLE FLAP CLOSURE N/A 04/22/2014   Procedure: VRAM FLAP;  Surgeon: Irene Limbo, MD;  Location: [facility];  Service: Plastics;  Laterality: N/A;  . PORT-A-CATH PLACEMENT  02-21-2011   REMOVED  2013  . PORTACATH PLACEMENT Left 06/24/2014   Procedure: INSERTION PORT-A-CATH LEFT SUBCLAVIAN;  Surgeon: Leighton Ruff, MD;  Location: [facility];  Service: General;  Laterality: Left;  . RE-EXCISION ANAL SQUAMOUS CELL CARCINOMA  2010  . REMOVAL RECTAL MASS  01-08-2014  . RESECTION ANAL MASS  02-05-2011  . SEPTOPLASTY   1985  . SPHINCTEROTOMY  2009  . URETHROTOMY Bilateral 04/22/2014   Procedure: CYSTOSCOPY/URETHROTOMY;  Surgeon: Leighton Ruff, MD;  Location: [facility];  Service: General;  Laterality: Bilateral;    SOCIAL HISTORY: Social History   Social History  . Marital status: Single    Spouse name: N/A  . Number of children: 0  . Years of education: N/A   Occupational History  .  [place] History Main Topics  . Smoking status: Never Smoker  . Smokeless tobacco: Never Used  . Alcohol use No     Comment: RARE  . Drug use: No  . Sexual activity: Not on file   Other Topics Concern  . Not on file   Social History  Narrative   Full time. Single. Does not regularly exercise.    Lives alone, his mother lives close by    FAMILY HISTORY: Family History  Problem Relation Age of Onset  . Diabetes type II Mother   . Breast cancer Mother 34     survivor  . Hypertension Mother   . Coronary artery disease Father   Paternal GM had utrine cancer, no other history of malignancy   ALLERGIES:  is allergic to ace inhibitors and sulfa antibiotics.  MEDICATIONS:  Current Outpatient Prescriptions  Medication Sig Dispense Refill  . aspirin (ASPIR-81) 81 MG EC tablet Take 81 mg by mouth at bedtime.     Marland Kitchen atorvastatin (LIPITOR) 80 MG tablet Take 1 tablet (80 mg total) by mouth every evening. 90 tablet 4  . Blood Glucose Monitoring Suppl (FREESTYLE LITE) DEVI     . citalopram (CELEXA) 20 MG tablet Take by mouth.    . efavirenz-emtrictabine-tenofovir (ATRIPLA) 600-200-300 MG per tablet Take 1 tablet by mouth at bedtime.     . Empagliflozin-Linagliptin (GLYXAMBI) 25-5 MG TABS Take 1 tablet by mouth daily.    Marland Kitchen gabapentin (NEURONTIN) 100 MG capsule TAKE 2 CAPSULES (200 MG TOTAL) BY MOUTH 3 (THREE) TIMES DAILY. (Patient taking differently: Take 300 mg by mouth at bedtime. ) 180 capsule 1  . glimepiride (AMARYL) 2 MG tablet Take 2 mg by mouth daily with breakfast.     .  HYDROcodone-acetaminophen (NORCO/VICODIN) 5-325 MG tablet Take 1 tablet by mouth every 8 (eight) hours as needed for moderate pain. 30 tablet 0  . HYDROcodone-homatropine (HYCODAN) 5-1.5 MG/5ML syrup Take 5 mLs by mouth every 6 (six) hours as needed for cough. 120 mL 0  . losartan (COZAAR) 100 MG tablet Take 1 tablet (100 mg total) by mouth daily. 90 tablet 4  . metFORMIN (GLUCOPHAGE) 1000 MG tablet Take 1,000 mg by mouth 2 (two) times daily.      . ondansetron (ZOFRAN) 8 MG tablet Take 1 tablet (8 mg total) by mouth 2 (two) times daily as needed for nausea or vomiting. Take 8 mg by mouth 2 times daily for 3 days and as needed for nausea -  Start on third  day after chemo . 30 tablet 1  . prochlorperazine (COMPAZINE) 10 MG tablet Take 1 tablet (10 mg total) by mouth every 6 (six) hours as needed for nausea or vomiting. 30 tablet 1   No current facility-administered medications for this visit.    Facility-Administered Medications Ordered in Other Visits  Medication Dose Route Frequency Provider Last Rate Last Dose  . heparin lock flush 100 unit/mL  500 Units Intravenous Once Truitt Merle, MD      . sodium chloride 0.9 % injection 10 mL  10 mL Intravenous PRN Truitt Merle, MD        REVIEW OF SYSTEMS:   Constitutional: Denies fevers, chills or abnormal night sweats Eyes: Denies blurriness of vision, double vision or watery eyes Ears, nose, mouth, throat, and face: Denies mucositis or sore throat Respiratory: Denies cough, dyspnea or wheezes Cardiovascular: Denies palpitation, chest discomfort or lower extremity swelling Gastrointestinal:  Denies nausea, heartburn or change in bowel habits, (+) colostomy bag Skin: Mild skin pigmentation from his previous rash. Lymphatics: Denies new lymphadenopathy or easy bruising Neurological:Denies numbness, tingling or new weaknesses Behavioral/Psych: Mood is stable, no new changes  All other systems were reviewed with the patient and are negative.  PHYSICAL  EXAMINATION: ECOG PERFORMANCE STATUS: 1 BP 133/77 (BP Location: Right Arm, Patient  Position: Sitting)   Pulse 80   Temp 98.6 F (37 C) (Oral)   Resp 18   Ht 5\' 10"  (1.778 m)   Wt 195 lb 12.8 oz (88.8 kg)   SpO2 100%   BMI 28.09 kg/m   GENERAL:alert, no distress, sits in a wheelchair comfortably SKIN: skin color, texture, turgor are normal. A few skin pigmentation from his previous rash EYES: normal, conjunctiva are pink and non-injected, sclera clear OROPHARYNX:no exudate, no erythema and lips, buccal mucosa, and tongue normal  NECK: supple, thyroid normal size, non-tender, without nodularity LYMPH:  no palpable lymphadenopathy in the cervical, axillary or inguinal LUNGS: clear to auscultation and percussion with normal breathing effort HEART: regular rate & rhythm and no murmurs and no lower extremity edema ABDOMEN:abdomen soft, non-tender and normal bowel sounds. (+) Colostomy bag. The perianal reconstruction site is completely healed.  Musculoskeletal:no cyanosis of digits and no clubbing  PSYCH: alert & oriented x 3 with fluent speech NEURO: no focal motor/sensory deficits  LABORATORY DATA:  I have reviewed the data as listed CBC Latest Ref Rng & Units 03/21/2016 02/21/2016 01/31/2016  WBC 4.0 - 10.3 10e3/uL 5.4 5.5 2.4(L)  Hemoglobin 13.0 - 17.1 g/dL 10.8(L) 9.5(L) 10.0(L)  Hematocrit 38.4 - 49.9 % 32.3(L) 28.6(L) 29.7(L)  Platelets 140 - 400 10e3/uL 170 199 241    CMP Latest Ref Rng & Units 03/21/2016 02/21/2016 01/31/2016  Glucose 70 - 140 mg/dl 136 126 75  BUN 7.0 - 26.0 mg/dL 19.2 20.5 19.4  Creatinine 0.7 - 1.3 mg/dL 1.2 1.1 1.0  Sodium 136 - 145 mEq/L 138 137 138  Potassium 3.5 - 5.1 mEq/L 5.0 4.6 4.5  Chloride 96 - 112 mEq/L - - -  CO2 22 - 29 mEq/L 23 22 23   Calcium 8.4 - 10.4 mg/dL 9.1 9.4 9.6  Total Protein 6.4 - 8.3 g/dL 7.9 7.8 8.0  Total Bilirubin 0.20 - 1.20 mg/dL <0.30 0.38 <0.30  Alkaline Phos 40 - 150 U/L 172(H) 154(H) 145  AST 5 - 34 U/L 21 25 30   ALT 0 -  55 U/L 24 25 36   ANC 0.9 today   PATHOLOGY REPORT 04/22/2014 Diagnosis 1. Soft tissue, biopsy, right pelvic sidewall; r/o malignancy - FIBROADIPOSE TISSUE, NO EVIDENCE OF MALIGNANCY. 2. Colon, segmental resection for tumor, rectum,sigmoid and anus - RECURRENT INVASIVE POORLY DIFFERENTIATED SQUAMOUS CELL CARCINOMA, INVADING THROUGH THE MUSCULARIS PROPRIA, INTO PERICOLONIC FATTY TISSUE, EXTENDING TO THE DEEP INKED MARGIN. - PERINEURAL INVASION AND ANGIOLYMPHATIC PRESENT. - SEVEN OF SEVENTEEN LYMPH NODES, POSITIVE FOR METASTATIC CARCINOMA (7/17) Microscopic Comment 2. Anus Specimen: Anus, rectum, sigmoid Procedure: Segmental resection Tumor site: Distal ends of specimen Specimen integrity: Intact Invasive tumor: Maximum size: At least 2.5 cm Histologic type(s): Invasive squamous cell carcinoma Histologic grade and differentiation: G2-3, moderately to poorly differentiated Microscopic extension of invasive tumor: Invading through the muscularis propria into pericolonic fatty tissue involving adjacent skeletal muscle tissue Lymph-Vascular invasion: Present Peri-neural invasion: Present Resection margins: Deep margin is positive for tumor Treatment effect (neo-adjuvant therapy): Present, minimally Lymph nodes: number examined 17; number positive: 7 Pathologic Staging: ypT2, yN1 (R1) Ancillary studies: N/A  Diagnosis 03/15/2015 Bone, biopsy, caudal left side of the sacrum - METASTATIC SQUAMOUS CELL CARCINOMA, SEE COMMENT. Microscopic Comment Biopsies demonstrate extensive involvement by metastatic focally keratinizing, well to moderately differentiated squamous cell carcinoma. The previous anorectal resection demonstrating invasive squamous cell carcinoma is noted [id number]). (CRR:ecj 03/16/2015)  RADIOGRAPHIC STUDIES: I have personally reviewed the radiological images as listed and agreed with the findings  in the report.  PET 03/23/2016 IMPRESSION: 1. Infiltrative mixed lytic  and sclerotic osseous metastasis throughout the bilateral sacrum and medial right iliac bone, which demonstrates increased sclerosis on the CT images and low level hypermetabolism that has significantly decreased since the 07/07/2015 PET-CT study. Findings are consistent with significant treatment response. Nondisplaced pathologic fractures in the bilateral anterior sacrum. 2. No additional sites of hypermetabolic metastatic disease. No evidence of hypermetabolic tumor recurrence in the low anterior resection bed. 3. Mild patchy subpleural reticulation throughout both lungs, stable to slightly decreased, favor mild scarring related to treatment of lymphangitic carcinomatosis. No hypermetabolism in the lungs. 4. No hypermetabolic liver metastases. Previously described right liver lesions are not visualized on today's scan. 5. Additional findings include aortic atherosclerosis, three-vessel coronary atherosclerosis, stable moderate symmetry gynecomastia, cholelithiasis and mild distal colonic diverticulosis.     ASSESSMENT & PLAN:  55 years old male with past medical history of anal squamous cell carcinoma, status post surgical resection and concurrent chemoradiation in 2009, now has local recurrence status post APR and  reconstruction.ypT2, yN1 (R1), unfortunately his deep surgical margin was positive, and 7 out of 17 lymph nodes were positive also. No distant metastasis on the preop PET scan. His medical history is also complicated by diabetes, coronary artery disease status post stent placement, and HIV on treatment.   1. Local recurrence of anal squamous cell carcinoma, with positive surgical margines, biopsy proven sacral bone mets on 03/15/2015, lung mets in 09/2015 -His CT chest in 09/2015 showed bilateral lung infiltrative changes likely metastasis -He knows this is terminal and incurable, his prognosis is extremely poor, giving he has had multiple lines of therapy. -He started  weekly carbo and Taxol, tolerated well so far, he has developed mild peripheral neuropathy -he has significant clinical improvement since he started chemotherapy, off oxygen, cough and dyspnea has resolved, performance status much improved -I reviewed his restaging CT scan images from 12/16/2015 in person with patient. The infiltrative lesions in the lungs are likely metastatic cancer, which have significantly improved. His liver lesions have been there for years, unlikely metastatic disease. -He clinically responded to [place] Taxol very well, cough and dyspnea resolved, off oxygen now. -I reviewed his recent restaging PET scan images with pt in person, which showed near complete response to treatment. No new lesions. -We'll continue observation for now, plan to repeat PET scan in 3 months. -We again discussed that his metastatic cancer is incurable, likely will regrow again in the future, and we will keep close monitoring   2. Peripheral neuropathy, G1 -improved lately -He is diabetic, has baseline mild neuropathy -continue Neurontin  -Continue close monitoring  4. Sacral pain -secondary to his sacral bone metastasis, status post palliative radiation, much improved lately -He has not needed oxycodone or hydrocodone lately  -He tried tramadol before, it did not work well -I suggest him to use Tylenol as needed.   5. Diabetes -controlled recently  -continue metformin and glimepiride, follow up with his endocrinologist   6. HIV  -Well controlled. Continue his treatment . His recent CD4 count was normal.   7. Hypertension and coronary artery disease  -He will continue follow-up with his primary care physician and cardiologist. Continue medications.   8. depression -worse since disease progression, not suicidal. -much better lately   9. CODE STATUS -Has a long conversation about goals of care and CODE STATUS. Patient is agreeable with DO NOT RESUSCITATE and DO NOT INTUBATE -I encouraged  him to call us if he  has worsening short of breast, and we can see him in our symptom management clinic, instead of going to emergency room.   Plan -PET scan reviewed, near complete response -Port flush today -Lab, port flush and follow-up in 6 weeks  All questions were answered. The patient knows to call the clinic with any problems, questions or concerns.   I spent 20 minutes counseling the patient face to face. The total time spent in the appointment was 25 minutes and more than 50% was on counseling.     Truitt Merle, MD 03/28/2016

## 2016-03-28 NOTE — Telephone Encounter (Signed)
Avs report and  Appointment schedule, given to patient, per 03/28/16 los. °

## 2016-04-27 ENCOUNTER — Other Ambulatory Visit: Payer: Self-pay | Admitting: Hematology

## 2016-04-27 DIAGNOSIS — C211 Malignant neoplasm of anal canal: Secondary | ICD-10-CM

## 2016-05-07 NOTE — Progress Notes (Signed)
[facility] FOLLOW-UP NOTE  Patient Care Team: Leonel Ramsay, MD as PCP - General (Infectious Diseases) Minna Merritts, MD as Consulting Physician (Cardiology) Robert Bellow, MD (General Surgery) Leighton Ruff, MD as Consulting Physician (General Surgery) Irene Limbo, MD as Consulting Physician (Plastic Surgery) Truitt Merle, MD as Consulting Physician (Hematology) Festus Aloe, MD as Consulting Physician (Urology) Gery Pray, MD as Consulting Physician (Radiation Oncology)  DIAGNOSIS Follow-up recurrent anal cancer    Primary cancer of anal canal ([place])   02/05/2011 Initial Diagnosis    Primary cancer of anal canal, T1N0M0, s/p surgical resection with positive margines. He received definitive dose RT 45 Gy over 5 weeks with 16Gy boosting and concurrent chemo with 5FU, treated at [facility] by Drs.  Geoffreyh and [employer].       11/23/2013 Relapse/Recurrence    local recurrence       04/21/2014 Surgery    abdominal perianal resection (APR) and VRAM flap closure of perineal wound on 04/21/14, surgical margins were positive for cancer       06/28/2014 - 09/25/2014 Chemotherapy    5-FU 1000mg /m2/day on D1-5, ciplatin 100mg /m2 on D2, every 28 days, s/p 4 cycles       10/11/2014 Progression    CT CAP showed new liver lesions and abd/pelc adenopathy, highly suspecious for progressive metastatic disease       11/02/2014 Imaging    abdomen MRI w wo contrast showed new small lesions in the right hepatic lobe with nonspecific enhancement, and enlarging nodes in the porta hepatis, metastatic disease not excluded       02/24/2015 Imaging    PET scan showed hypermetabolic bone mets in right sacrum, and small hypermetabolic retroperitoneal lymph nodes, no other abnormal metabolic activity within the liver or other place.      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      04/15/2015 - 08/18/2015  Chemotherapy    Nivolumab 240 mg every 2 weeks, stopped due to disease progression       07/07/2015 Imaging    PET scan showed interval progression of lytic metastasis involving the righ side of sacrum, pathologic fracture involving the right sacral wing. Improvement in retroperitoneal lymph node metastasis. 7 mm nodule in the left upper lobe lung, indeterminate.      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      10/20/2015 - 02/21/2016 Chemotherapy    Weekly carboplatin AUC 2, Taxol 80 mg/m, 3 weeks on, one-week off, changed to every 3 weeks from cycle 3       03/23/2016 Imaging           OTHER RELATED ISSUES: 1. (+) HIV, on therapy with good control  2. CAD, s/p stent placement in [place]: observation   INTERIM HISTORY: Govanny returns for follow up. He is doing very well, denies any significant pain, his previous sacral pain has resolved. No cough, dyspnea, or other symptoms. He has good appetite and energy level, no limitation on his activities, although he is not very physically active, is thinking about doing some volunteer job.  MEDICAL HISTORY:  Past Medical History:  Diagnosis Date  . Anxiety   .  At risk for sleep apnea    STOP-BANG= 4     SENT TO PCP 03-23-2014  . Coronary artery disease CARDIOLOGIST--  DR Rockey Situ --  LAST LOV 04-17-2013   S/P  PCI to mid and distal LAD and BM stent x1 to pLAD  . Depression   . ED (erectile dysfunction)   . History of carcinoma in situ of anal canal    2010  . History of rectal cancer    04/ 2012--  invasive squamous cell carcinoma at 9 o'clock position, poorly differentiated s/p resection and  30 cylces radiation / chemo therapy for 2 weeks  . HIV  positive ([place])   . Hyperlipidemia   . Hypertension   . PONV (postoperative nausea and vomiting)   . Primary cancer of anal canal ([place]) 05/18/2011  . Radiation 01/2011   definitive dose RT 45 Gy over 5 weeks with 16Gy boosting  . Radiation 09/01/15-09/22/15   right sacrum 35 Gy  . 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 S/p bare metal coronary artery stent    1998  pLAD  . Sigmoid diverticulosis   . Type 2 diabetes mellitus ([place])   . Wears glasses     SURGICAL HISTORY: Past Surgical History:  Procedure Laterality Date  . CARDIOVASCULAR STRESS TEST  04/ 2010   low risk study/  very small region of mildly reduced perfersion is mildly reversible dLAD territory  . COLONOSCOPY WITH PROPOFOL  05-28-2008  . CORONARY ANGIOPLASTY WITH STENT PLACEMENT  1998   [facility]   BM stent to pLAD and PCI to Mid and Distal LAD/  no other significant cad  . EUA W/ BX PERIANAL SKIN  06-08-2009  . EXCISION ANAL POLYP  2009  . INGUINAL HERNIA REPAIR Bilateral 1988  . LAPAROSCOPIC ASSISTED ABDOMINAL PERINEAL RESECTION N/A 04/22/2014   Procedure: LAPAROSCOPIC  ABDOMINAL PERINEAL RESECTION, ;  Surgeon: Leighton Ruff, MD;  Location: [facility];  Service: General;  Laterality: N/A;  . MASS EXCISION N/A 03/26/2014   Procedure: ANAL EXAM UNDER ANESTHESIA,EXCISIONAL BIOPSY OF ANAL MASS;  Surgeon: Leighton Ruff, MD;  Location: [facility];  Service: General;  Laterality: N/A;  . MUSCLE FLAP CLOSURE N/A 04/22/2014   Procedure: VRAM FLAP;  Surgeon: Irene Limbo, MD;  Location: [facility];  Service: Plastics;  Laterality: N/A;  . PORT-A-CATH PLACEMENT  02-21-2011   REMOVED  2013  . PORTACATH PLACEMENT Left 06/24/2014   Procedure: INSERTION PORT-A-CATH LEFT SUBCLAVIAN;  Surgeon: Leighton Ruff, MD;  Location: [facility];  Service: General;  Laterality: Left;  . RE-EXCISION ANAL SQUAMOUS CELL CARCINOMA  2010  . REMOVAL RECTAL MASS  01-08-2014  . RESECTION ANAL MASS  02-05-2011  .  SEPTOPLASTY  1985  . SPHINCTEROTOMY  2009  . URETHROTOMY Bilateral 04/22/2014   Procedure: CYSTOSCOPY/URETHROTOMY;  Surgeon: Leighton Ruff, MD;  Location: [facility];  Service: General;  Laterality: Bilateral;    SOCIAL HISTORY: Social History   Social History  . Marital status: Single    Spouse name: N/A  . Number of children: 0  . Years of education: N/A   Occupational History  .  [place] History Main Topics  . Smoking status: Never Smoker  . Smokeless tobacco: Never Used  . Alcohol use No     Comment: RARE  . Drug use: No  . Sexual activity: Not on file   Other Topics Concern  . Not on file   Social History  Narrative   Full time. Single. Does not regularly exercise.    Lives alone, his mother lives close by    FAMILY HISTORY: Family History  Problem Relation Age of Onset  . Diabetes type II Mother   . Breast cancer Mother 31     survivor  . Hypertension Mother   . Coronary artery disease Father   Paternal GM had utrine cancer, no other history of malignancy   ALLERGIES:  is allergic to ace inhibitors and sulfa antibiotics.  MEDICATIONS:  Current Outpatient Prescriptions  Medication Sig Dispense Refill  . aspirin (ASPIR-81) 81 MG EC tablet Take 81 mg by mouth at bedtime.     Marland Kitchen atorvastatin (LIPITOR) 80 MG tablet Take 1 tablet (80 mg total) by mouth every evening. 90 tablet 4  . Blood Glucose Monitoring Suppl (FREESTYLE LITE) DEVI     . citalopram (CELEXA) 20 MG tablet Take by mouth.    . efavirenz-emtrictabine-tenofovir (ATRIPLA) 600-200-300 MG per tablet Take 1 tablet by mouth at bedtime.     . Empagliflozin-Linagliptin (GLYXAMBI) 25-5 MG TABS Take 1 tablet by mouth daily.    Marland Kitchen gabapentin (NEURONTIN) 100 MG capsule TAKE 2 CAPSULES (200 MG TOTAL) BY MOUTH 3 (THREE) TIMES DAILY. 180 capsule 1  . glimepiride (AMARYL) 2 MG tablet Take 2 mg by mouth daily with breakfast.     . losartan (COZAAR) 100 MG tablet Take 1 tablet (100 mg total) by mouth  daily. 90 tablet 4  . metFORMIN (GLUCOPHAGE) 1000 MG tablet Take 1,000 mg by mouth 2 (two) times daily.      Marland Kitchen HYDROcodone-acetaminophen (NORCO/VICODIN) 5-325 MG tablet Take 1 tablet by mouth every 8 (eight) hours as needed for moderate pain. (Patient not taking: Reported on 05/09/2016) 30 tablet 0  . HYDROcodone-homatropine (HYCODAN) 5-1.5 MG/5ML syrup Take 5 mLs by mouth every 6 (six) hours as needed for cough. (Patient not taking: Reported on 05/09/2016) 120 mL 0  . ondansetron (ZOFRAN) 8 MG tablet Take 1 tablet (8 mg total) by mouth 2 (two) times daily as needed for nausea or vomiting. Take 8 mg by mouth 2 times daily for 3 days and as needed for nausea -  Start on third  day after chemo . (Patient not taking: Reported on 05/09/2016) 30 tablet 1  . prochlorperazine (COMPAZINE) 10 MG tablet Take 1 tablet (10 mg total) by mouth every 6 (six) hours as needed for nausea or vomiting. (Patient not taking: Reported on 05/09/2016) 30 tablet 1   No current facility-administered medications for this visit.    Facility-Administered Medications Ordered in Other Visits  Medication Dose Route Frequency Provider Last Rate Last Dose  . heparin lock flush 100 unit/mL  500 Units Intravenous Once Truitt Merle, MD      . sodium chloride 0.9 % injection 10 mL  10 mL Intravenous PRN Truitt Merle, MD        REVIEW OF SYSTEMS:   Constitutional: Denies fevers, chills or abnormal night sweats Eyes: Denies blurriness of vision, double vision or watery eyes Ears, nose, mouth, throat, and face: Denies mucositis or sore throat Respiratory: Denies cough, dyspnea or wheezes Cardiovascular: Denies palpitation, chest discomfort or lower extremity swelling Gastrointestinal:  Denies nausea, heartburn or change in bowel habits, (+) colostomy bag Skin: Mild skin pigmentation from his previous rash. Lymphatics: Denies new lymphadenopathy or easy bruising Neurological:Denies numbness, tingling or new weaknesses Behavioral/Psych: Mood  is stable, no new changes  All other systems were reviewed with the patient and are negative.  PHYSICAL EXAMINATION: ECOG PERFORMANCE STATUS: 0 BP 132/80 (BP Location: Left Arm, Patient Position: Sitting)   Pulse 87   Temp 98.1 F (36.7 C) (Oral)   Resp 18   Ht 5\' 10"  (1.778 m)   Wt 199 lb 9.6 oz (90.5 kg)   SpO2 97%   BMI 28.64 kg/m   GENERAL:alert, no distress, sits in a wheelchair comfortably SKIN: skin color, texture, turgor are normal. A few skin pigmentation from his previous rash EYES: normal, conjunctiva are pink and non-injected, sclera clear OROPHARYNX:no exudate, no erythema and lips, buccal mucosa, and tongue normal  NECK: supple, thyroid normal size, non-tender, without nodularity LYMPH:  no palpable lymphadenopathy in the cervical, axillary or inguinal LUNGS: clear to auscultation and percussion with normal breathing effort HEART: regular rate & rhythm and no murmurs and no lower extremity edema ABDOMEN:abdomen soft, non-tender and normal bowel sounds. (+) Colostomy bag. The perianal reconstruction site is completely healed.  Musculoskeletal:no cyanosis of digits and no clubbing  PSYCH: alert & oriented x 3 with fluent speech NEURO: no focal motor/sensory deficits   LABORATORY DATA:  I have reviewed the data as listed CBC Latest Ref Rng & Units 05/09/2016 03/21/2016 02/21/2016  WBC 4.0 - 10.3 10e3/uL 4.4 5.4 5.5  Hemoglobin 13.0 - 17.1 g/dL 12.2(L) 10.8(L) 9.5(L)  Hematocrit 38.4 - 49.9 % 35.8(L) 32.3(L) 28.6(L)  Platelets 140 - 400 10e3/uL 197 170 199    CMP Latest Ref Rng & Units 05/09/2016 03/21/2016 02/21/2016  Glucose 70 - 140 mg/dl 207(H) 136 126  BUN 7.0 - 26.0 mg/dL 27.7(H) 19.2 20.5  Creatinine 0.7 - 1.3 mg/dL 1.3 1.2 1.1  Sodium 136 - 145 mEq/L 135(L) 138 137  Potassium 3.5 - 5.1 mEq/L 4.0 5.0 4.6  Chloride 96 - 112 mEq/L - - -  CO2 22 - 29 mEq/L 20(L) 23 22  Calcium 8.4 - 10.4 mg/dL 9.3 9.1 9.4  Total Protein 6.4 - 8.3 g/dL 8.0 7.9 7.8  Total  Bilirubin 0.20 - 1.20 mg/dL 0.34 <0.30 0.38  Alkaline Phos 40 - 150 U/L 179(H) 172(H) 154(H)  AST 5 - 34 U/L 29 21 25   ALT 0 - 55 U/L 30 24 25    ANC 0.9 today   PATHOLOGY REPORT 04/22/2014 Diagnosis 1. Soft tissue, biopsy, right pelvic sidewall; r/o malignancy - FIBROADIPOSE TISSUE, NO EVIDENCE OF MALIGNANCY. 2. Colon, segmental resection for tumor, rectum,sigmoid and anus - RECURRENT INVASIVE POORLY DIFFERENTIATED SQUAMOUS CELL CARCINOMA, INVADING THROUGH THE MUSCULARIS PROPRIA, INTO PERICOLONIC FATTY TISSUE, EXTENDING TO THE DEEP INKED MARGIN. - PERINEURAL INVASION AND ANGIOLYMPHATIC PRESENT. - SEVEN OF SEVENTEEN LYMPH NODES, POSITIVE FOR METASTATIC CARCINOMA (7/17) Microscopic Comment 2. Anus Specimen: Anus, rectum, sigmoid Procedure: Segmental resection Tumor site: Distal ends of specimen Specimen integrity: Intact Invasive tumor: Maximum size: At least 2.5 cm Histologic type(s): Invasive squamous cell carcinoma Histologic grade and differentiation: G2-3, moderately to poorly differentiated Microscopic extension of invasive tumor: Invading through the muscularis propria into pericolonic fatty tissue involving adjacent skeletal muscle tissue Lymph-Vascular invasion: Present Peri-neural invasion: Present Resection margins: Deep margin is positive for tumor Treatment effect (neo-adjuvant therapy): Present, minimally Lymph nodes: number examined 17; number positive: 7 Pathologic Staging: ypT2, yN1 (R1) Ancillary studies: N/A  Diagnosis 03/15/2015 Bone, biopsy, caudal left side of the sacrum - METASTATIC SQUAMOUS CELL CARCINOMA, SEE COMMENT. Microscopic Comment Biopsies demonstrate extensive involvement by metastatic focally keratinizing, well to moderately differentiated squamous cell carcinoma. The previous anorectal resection demonstrating invasive squamous cell carcinoma is noted [id number]). (CRR:ecj 03/16/2015)  RADIOGRAPHIC STUDIES:  I have personally reviewed the  radiological images as listed and agreed with the findings in the report.  PET 03/23/2016 IMPRESSION: 1. Infiltrative mixed lytic and sclerotic osseous metastasis throughout the bilateral sacrum and medial right iliac bone, which demonstrates increased sclerosis on the CT images and low level hypermetabolism that has significantly decreased since the 07/07/2015 PET-CT study. Findings are consistent with significant treatment response. Nondisplaced pathologic fractures in the bilateral anterior sacrum. 2. No additional sites of hypermetabolic metastatic disease. No evidence of hypermetabolic tumor recurrence in the low anterior resection bed. 3. Mild patchy subpleural reticulation throughout both lungs, stable to slightly decreased, favor mild scarring related to treatment of lymphangitic carcinomatosis. No hypermetabolism in the lungs. 4. No hypermetabolic liver metastases. Previously described right liver lesions are not visualized on today's scan. 5. Additional findings include aortic atherosclerosis, three-vessel coronary atherosclerosis, stable moderate symmetry gynecomastia, cholelithiasis and mild distal colonic diverticulosis.     ASSESSMENT & PLAN:  55 year old male with past medical history of anal squamous cell carcinoma, status post surgical resection and concurrent chemoradiation in 2009, now has local recurrence status post APR and  reconstruction.ypT2, yN1 (R1), unfortunately his deep surgical margin was positive, and 7 out of 17 lymph nodes were positive also. No distant metastasis on the preop PET scan. His medical history is also complicated by diabetes, coronary artery disease status post stent placement, and HIV on treatment.   1. Local recurrence of anal squamous cell carcinoma, with positive surgical margines, biopsy proven sacral bone mets on 03/15/2015, lung mets in 09/2015 -His CT chest in 09/2015 showed bilateral lung infiltrative changes likely metastasis -He  knows this is terminal and incurable, his prognosis is extremely poor, giving he has had multiple lines of therapy. -He started weekly carbo and Taxol, tolerated well so far, he has developed mild peripheral neuropathy -he has significant clinical improvement since he started chemotherapy, off oxygen, cough and dyspnea has resolved, performance status much improved -I previously reviewed his restaging CT scan images from 12/16/2015 in person with patient. The infiltrative lesions in the lungs are likely metastatic cancer, which have significantly improved. His liver lesions have been there for years, unlikely metastatic disease. -He clinically responded to [place] Taxol very well, cough and dyspnea resolved, off oxygen now.  -I previously reviewed his recent restaging PET scan images with pt in person, which showed near complete response to treatment. No new lesions. -She is clinically doing very well, asymptomatic, lab and physical exam today is unremarkable. We'll continue observation for now, plan to repeat PET scan in 6 weeks -We again discussed that his metastatic cancer is incurable, likely will regrow again in the future, and we will keep close monitoring    2. Peripheral neuropathy, G1 -improved lately, he takes Neurontin 300 mg night -He is diabetic, has baseline mild neuropathy -continue Neurontin  -Continue close monitoring  4. Sacral pain -secondary to his sacral bone metastasis, status post palliative radiation, resolved now. -He has not needed oxycodone or hydrocodone lately  -He tried tramadol before, it did not work well  5. Type 2 Diabetes -continue metformin and glimepiride, follow up with his endocrinologist   6. HIV  -Well controlled. Continue his treatment . His recent CD4 count was normal.   7. Hypertension and coronary artery disease  -He will continue follow-up with his primary care physician and cardiologist. Continue medications.   8. depression -worse since  disease progression, not suicidal. -much better lately  9. CODE STATUS -Has a long conversation  about goals of care and CODE STATUS. Patient is agreeable with DO NOT RESUSCITATE and DO NOT INTUBATE -I encouraged him to call us if he has worsening short of breast, and we can see him in our symptom management clinic, instead of going to emergency room.   Plan -lab and f/u in 6 weeks with PET a few days before   All questions were answered. The patient knows to call the clinic with any problems, questions or concerns.   I spent 20 minutes counseling the patient face to face. The total time spent in the appointment was 25 minutes and more than 50% was on counseling.     Truitt Merle, MD 05/09/2016

## 2016-05-09 ENCOUNTER — Ambulatory Visit (HOSPITAL_BASED_OUTPATIENT_CLINIC_OR_DEPARTMENT_OTHER): Payer: 59 | Admitting: Hematology

## 2016-05-09 ENCOUNTER — Inpatient Hospital Stay (HOSPITAL_COMMUNITY)
Admission: EM | Admit: 2016-05-09 | Discharge: 2016-05-11 | DRG: 100 | Disposition: A | Discharge status: Home / Self Care | Payer: 59 | Source: Other Acute Inpatient Hospital | Attending: Internal Medicine | Admitting: Internal Medicine

## 2016-05-09 ENCOUNTER — Other Ambulatory Visit (HOSPITAL_BASED_OUTPATIENT_CLINIC_OR_DEPARTMENT_OTHER): Payer: 59

## 2016-05-09 ENCOUNTER — Ambulatory Visit (HOSPITAL_BASED_OUTPATIENT_CLINIC_OR_DEPARTMENT_OTHER): Payer: 59

## 2016-05-09 ENCOUNTER — Encounter: Payer: Self-pay | Admitting: Hematology

## 2016-05-09 ENCOUNTER — Emergency Department: Payer: 59

## 2016-05-09 ENCOUNTER — Emergency Department
Admission: EM | Admit: 2016-05-09 | Discharge: 2016-05-09 | Disposition: A | Discharge status: Other Acute Inpatient Hospital | Payer: 59 | Attending: Emergency Medicine | Admitting: Emergency Medicine

## 2016-05-09 ENCOUNTER — Encounter: Payer: Self-pay | Admitting: Emergency Medicine

## 2016-05-09 ENCOUNTER — Telehealth: Payer: Self-pay | Admitting: Hematology

## 2016-05-09 VITALS — BP 132/80 | HR 87 | Temp 98.1°F | Resp 18 | Ht 70.0 in | Wt 199.6 lb

## 2016-05-09 DIAGNOSIS — Z85048 Personal history of other malignant neoplasm of rectum, rectosigmoid junction, and anus: Secondary | ICD-10-CM | POA: Insufficient documentation

## 2016-05-09 DIAGNOSIS — I2510 Atherosclerotic heart disease of native coronary artery without angina pectoris: Secondary | ICD-10-CM | POA: Diagnosis present

## 2016-05-09 DIAGNOSIS — I10 Essential (primary) hypertension: Secondary | ICD-10-CM

## 2016-05-09 DIAGNOSIS — Z794 Long term (current) use of insulin: Secondary | ICD-10-CM

## 2016-05-09 DIAGNOSIS — E1159 Type 2 diabetes mellitus with other circulatory complications: Secondary | ICD-10-CM

## 2016-05-09 DIAGNOSIS — T451X5A Adverse effect of antineoplastic and immunosuppressive drugs, initial encounter: Secondary | ICD-10-CM | POA: Diagnosis present

## 2016-05-09 DIAGNOSIS — F32A Depression, unspecified: Secondary | ICD-10-CM

## 2016-05-09 DIAGNOSIS — Z803 Family history of malignant neoplasm of breast: Secondary | ICD-10-CM

## 2016-05-09 DIAGNOSIS — Z9221 Personal history of antineoplastic chemotherapy: Secondary | ICD-10-CM

## 2016-05-09 DIAGNOSIS — Z7984 Long term (current) use of oral hypoglycemic drugs: Secondary | ICD-10-CM | POA: Diagnosis not present

## 2016-05-09 DIAGNOSIS — Z5181 Encounter for therapeutic drug level monitoring: Secondary | ICD-10-CM | POA: Diagnosis not present

## 2016-05-09 DIAGNOSIS — Z79899 Other long term (current) drug therapy: Secondary | ICD-10-CM | POA: Insufficient documentation

## 2016-05-09 DIAGNOSIS — C7931 Secondary malignant neoplasm of brain: Secondary | ICD-10-CM

## 2016-05-09 DIAGNOSIS — Z955 Presence of coronary angioplasty implant and graft: Secondary | ICD-10-CM | POA: Diagnosis not present

## 2016-05-09 DIAGNOSIS — R4701 Aphasia: Secondary | ICD-10-CM

## 2016-05-09 DIAGNOSIS — R569 Unspecified convulsions: Secondary | ICD-10-CM | POA: Diagnosis not present

## 2016-05-09 DIAGNOSIS — E119 Type 2 diabetes mellitus without complications: Secondary | ICD-10-CM

## 2016-05-09 DIAGNOSIS — B20 Human immunodeficiency virus [HIV] disease: Secondary | ICD-10-CM

## 2016-05-09 DIAGNOSIS — R531 Weakness: Secondary | ICD-10-CM

## 2016-05-09 DIAGNOSIS — N179 Acute kidney failure, unspecified: Secondary | ICD-10-CM | POA: Diagnosis present

## 2016-05-09 DIAGNOSIS — E785 Hyperlipidemia, unspecified: Secondary | ICD-10-CM | POA: Diagnosis present

## 2016-05-09 DIAGNOSIS — Z8249 Family history of ischemic heart disease and other diseases of the circulatory system: Secondary | ICD-10-CM | POA: Diagnosis not present

## 2016-05-09 DIAGNOSIS — C211 Malignant neoplasm of anal canal: Secondary | ICD-10-CM | POA: Diagnosis present

## 2016-05-09 DIAGNOSIS — Z833 Family history of diabetes mellitus: Secondary | ICD-10-CM

## 2016-05-09 DIAGNOSIS — Z21 Asymptomatic human immunodeficiency virus [HIV] infection status: Secondary | ICD-10-CM | POA: Diagnosis present

## 2016-05-09 DIAGNOSIS — E1165 Type 2 diabetes mellitus with hyperglycemia: Secondary | ICD-10-CM

## 2016-05-09 DIAGNOSIS — C7951 Secondary malignant neoplasm of bone: Secondary | ICD-10-CM | POA: Diagnosis present

## 2016-05-09 DIAGNOSIS — G939 Disorder of brain, unspecified: Secondary | ICD-10-CM | POA: Diagnosis present

## 2016-05-09 DIAGNOSIS — G4089 Other seizures: Principal | ICD-10-CM | POA: Diagnosis present

## 2016-05-09 DIAGNOSIS — Z882 Allergy status to sulfonamides status: Secondary | ICD-10-CM

## 2016-05-09 DIAGNOSIS — C20 Malignant neoplasm of rectum: Secondary | ICD-10-CM

## 2016-05-09 DIAGNOSIS — C210 Malignant neoplasm of anus, unspecified: Secondary | ICD-10-CM

## 2016-05-09 DIAGNOSIS — IMO0002 Reserved for concepts with insufficient information to code with codable children: Secondary | ICD-10-CM

## 2016-05-09 DIAGNOSIS — C7989 Secondary malignant neoplasm of other specified sites: Secondary | ICD-10-CM | POA: Diagnosis not present

## 2016-05-09 DIAGNOSIS — F329 Major depressive disorder, single episode, unspecified: Secondary | ICD-10-CM

## 2016-05-09 LAB — CBC WITH DIFFERENTIAL/PLATELET
BASO%: 0.2 % (ref 0.0–2.0)
BASOS PCT: 1 %
Basophils Absolute: 0 10*3/uL (ref 0.0–0.1)
Basophils Absolute: 0 10*3/uL (ref 0–0.1)
EOS ABS: 0.1 10*3/uL (ref 0.0–0.5)
EOS ABS: 0.1 10*3/uL (ref 0–0.7)
EOS%: 2.7 % (ref 0.0–7.0)
Eosinophils Relative: 3 %
HCT: 35.8 % — ABNORMAL LOW (ref 38.4–49.9)
HCT: 37.4 % — ABNORMAL LOW (ref 40.0–52.0)
HEMOGLOBIN: 12.2 g/dL — AB (ref 13.0–17.1)
Hemoglobin: 13 g/dL (ref 13.0–18.0)
LYMPH%: 20.6 % (ref 14.0–49.0)
Lymphocytes Relative: 24 %
Lymphs Abs: 1.2 10*3/uL (ref 1.0–3.6)
MCH: 33.6 pg — ABNORMAL HIGH (ref 27.2–33.4)
MCH: 34.5 pg — AB (ref 26.0–34.0)
MCHC: 34.1 g/dL (ref 32.0–36.0)
MCHC: 34.8 g/dL (ref 32.0–36.0)
MCV: 98.6 fL — AB (ref 79.3–98.0)
MCV: 99.2 fL (ref 80.0–100.0)
MONO ABS: 1 10*3/uL (ref 0.2–1.0)
MONO#: 0.6 10*3/uL (ref 0.1–0.9)
MONO%: 13.4 % (ref 0.0–14.0)
MONOS PCT: 20 %
NEUT%: 63.1 % (ref 39.0–75.0)
NEUTROS ABS: 2.8 10*3/uL (ref 1.5–6.5)
Neutro Abs: 2.6 10*3/uL (ref 1.4–6.5)
Neutrophils Relative %: 52 %
Platelets: 197 10*3/uL (ref 140–400)
Platelets: 246 10*3/uL (ref 150–440)
RBC: 3.63 10*6/uL — ABNORMAL LOW (ref 4.20–5.82)
RBC: 3.76 MIL/uL — ABNORMAL LOW (ref 4.40–5.90)
RDW: 13 % (ref 11.0–14.6)
RDW: 13.6 % (ref 11.5–14.5)
WBC: 4.4 10*3/uL (ref 4.0–10.3)
WBC: 5 10*3/uL (ref 3.8–10.6)
lymph#: 0.9 10*3/uL (ref 0.9–3.3)

## 2016-05-09 LAB — BASIC METABOLIC PANEL
Anion gap: 9 (ref 5–15)
BUN: 33 mg/dL — AB (ref 6–20)
CALCIUM: 9.1 mg/dL (ref 8.9–10.3)
CHLORIDE: 101 mmol/L (ref 101–111)
CO2: 25 mmol/L (ref 22–32)
CREATININE: 1.47 mg/dL — AB (ref 0.61–1.24)
GFR calc non Af Amer: 52 mL/min — ABNORMAL LOW (ref >60)
GFR, EST AFRICAN AMERICAN: 60 mL/min — AB (ref >60)
Glucose, Bld: 144 mg/dL — ABNORMAL HIGH (ref 65–99)
Potassium: 3.8 mmol/L (ref 3.5–5.1)
SODIUM: 135 mmol/L (ref 135–145)

## 2016-05-09 LAB — COMPREHENSIVE METABOLIC PANEL
ALBUMIN: 3.6 g/dL (ref 3.5–5.0)
ALK PHOS: 179 U/L — AB (ref 40–150)
ALT: 30 U/L (ref 0–55)
AST: 29 U/L (ref 5–34)
Anion Gap: 12 mEq/L — ABNORMAL HIGH (ref 3–11)
BILIRUBIN TOTAL: 0.34 mg/dL (ref 0.20–1.20)
BUN: 27.7 mg/dL — AB (ref 7.0–26.0)
CO2: 20 meq/L — AB (ref 22–29)
CREATININE: 1.3 mg/dL (ref 0.7–1.3)
Calcium: 9.3 mg/dL (ref 8.4–10.4)
Chloride: 103 mEq/L (ref 98–109)
EGFR: 60 mL/min/{1.73_m2} — ABNORMAL LOW (ref >90)
GLUCOSE: 207 mg/dL — AB (ref 70–140)
Potassium: 4 mEq/L (ref 3.5–5.1)
SODIUM: 135 meq/L — AB (ref 136–145)
TOTAL PROTEIN: 8 g/dL (ref 6.4–8.3)

## 2016-05-09 LAB — PROTIME-INR
INR: 0.98
PROTHROMBIN TIME: 13 s (ref 11.4–15.2)

## 2016-05-09 LAB — APTT: aPTT: 25 seconds (ref 24–36)

## 2016-05-09 LAB — TROPONIN I

## 2016-05-09 LAB — GLUCOSE, CAPILLARY: Glucose-Capillary: 128 mg/dL — ABNORMAL HIGH (ref 65–99)

## 2016-05-09 MED ORDER — CITALOPRAM HYDROBROMIDE 20 MG PO TABS
20.0000 mg | ORAL_TABLET | Freq: Every day | ORAL | Status: DC
  Administered 2016-05-10 – 2016-05-11 (×2): 20 mg via ORAL
  Filled 2016-05-09 (×2): qty 1

## 2016-05-09 MED ORDER — SENNOSIDES-DOCUSATE SODIUM 8.6-50 MG PO TABS: 1.0000 | ORAL_TABLET | Freq: Every evening | ORAL | Status: DC | PRN

## 2016-05-09 MED ORDER — ACETAMINOPHEN 325 MG PO TABS: 650.0000 mg | ORAL_TABLET | Freq: Four times a day (QID) | ORAL | Status: DC | PRN

## 2016-05-09 MED ORDER — DEXAMETHASONE SODIUM PHOSPHATE 4 MG/ML IJ SOLN
10.0000 mg | Freq: Four times a day (QID) | INTRAMUSCULAR | Status: DC
  Administered 2016-05-09: 10 mg via INTRAVENOUS

## 2016-05-09 MED ORDER — EMPAGLIFLOZIN-LINAGLIPTIN 25-5 MG PO TABS: 1.0000 | ORAL_TABLET | Freq: Every day | ORAL | Status: DC

## 2016-05-09 MED ORDER — LINAGLIPTIN 5 MG PO TABS
5.0000 mg | ORAL_TABLET | Freq: Every day | ORAL | Status: DC
  Administered 2016-05-11: 5 mg via ORAL
  Filled 2016-05-09 (×2): qty 1

## 2016-05-09 MED ORDER — ACETAMINOPHEN 650 MG RE SUPP: 650.0000 mg | Freq: Four times a day (QID) | RECTAL | Status: DC | PRN

## 2016-05-09 MED ORDER — HYDROCODONE-ACETAMINOPHEN 5-325 MG PO TABS: 1.0000 | ORAL_TABLET | ORAL | Status: DC | PRN

## 2016-05-09 MED ORDER — SODIUM CHLORIDE 0.9 % IV SOLN
INTRAVENOUS | Status: DC
  Administered 2016-05-10: via INTRAVENOUS

## 2016-05-09 MED ORDER — GABAPENTIN 100 MG PO CAPS
200.0000 mg | ORAL_CAPSULE | Freq: Three times a day (TID) | ORAL | Status: DC
  Administered 2016-05-10 – 2016-05-11 (×6): 200 mg via ORAL
  Filled 2016-05-09 (×6): qty 2

## 2016-05-09 MED ORDER — ATORVASTATIN CALCIUM 40 MG PO TABS
80.0000 mg | ORAL_TABLET | Freq: Every evening | ORAL | Status: DC
  Administered 2016-05-10: 80 mg via ORAL
  Filled 2016-05-09: qty 2

## 2016-05-09 MED ORDER — EFAVIRENZ-EMTRICITAB-TENOFOVIR 600-200-300 MG PO TABS
1.0000 | ORAL_TABLET | Freq: Every day | ORAL | Status: DC
  Administered 2016-05-10 (×2): 1 via ORAL
  Filled 2016-05-09 (×3): qty 1

## 2016-05-09 MED ORDER — GADOBENATE DIMEGLUMINE 529 MG/ML IV SOLN
20.0000 mL | Freq: Once | INTRAVENOUS | Status: AC | PRN
  Administered 2016-05-09: 18 mL via INTRAVENOUS

## 2016-05-09 MED ORDER — CANAGLIFLOZIN 100 MG PO TABS
100.0000 mg | ORAL_TABLET | Freq: Every day | ORAL | Status: DC
  Administered 2016-05-11: 100 mg via ORAL
  Filled 2016-05-09 (×2): qty 1

## 2016-05-09 MED ORDER — SODIUM CHLORIDE 0.9 % IJ SOLN
10.0000 mL | INTRAMUSCULAR | Status: DC | PRN
Start: 2016-05-09 — End: 2016-05-09
  Administered 2016-05-09: 10 mL via INTRAVENOUS
  Filled 2016-05-09: qty 10

## 2016-05-09 MED ORDER — LORAZEPAM 2 MG/ML IJ SOLN
INTRAMUSCULAR | Status: AC
  Filled 2016-05-09: qty 1

## 2016-05-09 MED ORDER — LORAZEPAM 2 MG/ML IJ SOLN
1.0000 mg | Freq: Once | INTRAMUSCULAR | Status: AC
  Administered 2016-05-09: 1 mg via INTRAVENOUS

## 2016-05-09 MED ORDER — ENOXAPARIN SODIUM 40 MG/0.4ML ~~LOC~~ SOLN
40.0000 mg | Freq: Every day | SUBCUTANEOUS | Status: DC
  Filled 2016-05-09: qty 0.4

## 2016-05-09 MED ORDER — HEPARIN SOD (PORK) LOCK FLUSH 100 UNIT/ML IV SOLN
500.0000 [IU] | Freq: Once | INTRAVENOUS | Status: AC | PRN
  Administered 2016-05-09: 500 [IU] via INTRAVENOUS
  Filled 2016-05-09: qty 5

## 2016-05-09 MED ORDER — DEXAMETHASONE SODIUM PHOSPHATE 4 MG/ML IJ SOLN
INTRAMUSCULAR | Status: AC
  Filled 2016-05-09: qty 3

## 2016-05-09 NOTE — Telephone Encounter (Signed)
Appointments scheduled per 05/09/16 los. AVS report and appointment schedule given to patient per 05/09/16 los. °

## 2016-05-09 NOTE — Progress Notes (Signed)
Patient presents with aphasia, known history of colon cancer, CT head with multiple metastasis. ED physician discussed case with Dr Irene Limbo with oncology who recommend transfer to [facility] and IV decadron. Patient symptoms has resolved and is stable for oncology bed.   Niel Hummer, MD.

## 2016-05-09 NOTE — ED Notes (Signed)
Called [facility] for Transfer to Adult Oncology 2022

## 2016-05-09 NOTE — ED Provider Notes (Signed)
[facility] Emergency Department Provider Note  ____________________________________________  Time seen: Approximately 7:36 PM  I have reviewed the triage vital signs and the nursing notes.   HISTORY  Chief Complaint Aphasia   HPI SHAKA HUNEKE is a 55 y.o. male history of CAD, metastatic colorectal cancer currently undergoing hemotherapy at [facility], HIV, DM, HTN, HLD who presents for evaluation of aphasia. Patient drove to church at 6:30 PM and when he arrived at the parking lot he developed sudden onset of difficulty finding words. According to patient's pastor who was present at the scene and who is also here at the bedside, patient had difficulty finding words but no facial droop, patient had intact strength bilaterally, no gait instability. The episode lasted about 25 minutes. When EMS arrived patient was neurologically intact with no difficulty finding words. Patient denies any prior history of stroke. Patient takes daily baby aspirin. Patient denies headache, chest pain, changes in vision, nausea, vomiting, shortness of breath, abdominal pain, back pain. Patient reports that he feels back to baseline.   Past Medical History:  Diagnosis Date  . Anxiety   . At risk for sleep apnea    STOP-BANG= 4     SENT TO PCP 03-23-2014  . Coronary artery disease CARDIOLOGIST--  DR Rockey Situ --  LAST LOV 04-17-2013   S/P  PCI to mid and distal LAD and BM stent x1 to pLAD  . Depression   . ED (erectile dysfunction)   . History of carcinoma in situ of anal canal    2010  . History of rectal cancer    04/ 2012--  invasive squamous cell carcinoma at 9 o'clock position, poorly differentiated s/p resection and  30 cylces radiation / chemo therapy for 2 weeks  . HIV positive ([place])   . Hyperlipidemia   . Hypertension   . PONV (postoperative nausea and vomiting)   . Primary cancer of anal canal ([place]) 05/18/2011  . Radiation 01/2011   definitive dose RT 45 Gy over 5  weeks with 16Gy boosting  . Radiation 09/01/15-09/22/15   right sacrum 35 Gy  . 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 S/p bare metal coronary artery stent    1998  pLAD  . Sigmoid diverticulosis   . Type 2 diabetes mellitus ([place])   . Wears glasses     Patient Active Problem List   Diagnosis Date Noted  . Aphasia 05/09/2016  . Dyspnea 10/16/2015  . Recurrent squamous cell carcinoma of anus ([place]) 08/17/2015  . HIV (human immunodeficiency virus infection) ([place]) 05/27/2015  . Type 2 diabetes mellitus ([place]) 05/27/2015  . Depression 05/27/2015  . History of rectal cancer 03/24/2015  . Erectile dysfunction 04/17/2013  . Diabetes type 2, uncontrolled ([place]) 04/17/2013  . Primary cancer of anal canal ([place]) 05/18/2011  . Hyperlipidemia 09/13/2009  . HYPERTENSION, BENIGN 09/13/2009  . CAD, NATIVE VESSEL 09/13/2009    Past Surgical History:  Procedure Laterality Date  . CARDIOVASCULAR STRESS TEST  04/ 2010   low risk study/  very small region of mildly reduced perfersion is mildly reversible dLAD territory  . COLONOSCOPY WITH PROPOFOL  05-28-2008  . CORONARY ANGIOPLASTY WITH STENT PLACEMENT  1998   [facility]   BM stent to pLAD and PCI to Mid and Distal LAD/  no other significant cad  . EUA W/ BX PERIANAL SKIN  06-08-2009  . EXCISION ANAL POLYP  2009  . INGUINAL HERNIA REPAIR Bilateral 1988  .  LAPAROSCOPIC ASSISTED ABDOMINAL PERINEAL RESECTION N/A 04/22/2014   Procedure: LAPAROSCOPIC  ABDOMINAL PERINEAL RESECTION, ;  Surgeon: Leighton Ruff, MD;  Location: [facility];  Service: General;  Laterality: N/A;  . MASS EXCISION N/A 03/26/2014   Procedure: ANAL EXAM UNDER ANESTHESIA,EXCISIONAL BIOPSY OF ANAL MASS;  Surgeon: Leighton Ruff, MD;  Location: [facility];  Service: General;  Laterality: N/A;  . MUSCLE FLAP CLOSURE N/A 04/22/2014   Procedure: VRAM FLAP;  Surgeon: Irene Limbo, MD;  Location: [facility];  Service: Plastics;  Laterality: N/A;  .  PORT-A-CATH PLACEMENT  02-21-2011   REMOVED  2013  . PORTACATH PLACEMENT Left 06/24/2014   Procedure: INSERTION PORT-A-CATH LEFT SUBCLAVIAN;  Surgeon: Leighton Ruff, MD;  Location: [facility];  Service: General;  Laterality: Left;  . RE-EXCISION ANAL SQUAMOUS CELL CARCINOMA  2010  . REMOVAL RECTAL MASS  01-08-2014  . RESECTION ANAL MASS  02-05-2011  . SEPTOPLASTY  1985  . SPHINCTEROTOMY  2009  . URETHROTOMY Bilateral 04/22/2014   Procedure: CYSTOSCOPY/URETHROTOMY;  Surgeon: Leighton Ruff, MD;  Location: [facility];  Service: General;  Laterality: Bilateral;    Prior to Admission medications   Medication Sig Start Date End Date Taking? Authorizing Provider  aspirin (ASPIR-81) 81 MG EC tablet Take 81 mg by mouth at bedtime.     Historical Provider, MD  atorvastatin (LIPITOR) 80 MG tablet Take 1 tablet (80 mg total) by mouth every evening. 08/08/15   Minna Merritts, MD  Blood Glucose Monitoring Suppl (FREESTYLE LITE) [facility]  12/14/15 12/13/16  Historical Provider, MD  citalopram (CELEXA) 20 MG tablet Take by mouth. 12/06/15 12/05/16  Historical Provider, MD  efavirenz-emtrictabine-tenofovir (ATRIPLA) 600-200-300 MG per tablet Take 1 tablet by mouth at bedtime.     Historical Provider, MD  Empagliflozin-Linagliptin (GLYXAMBI) 25-5 MG TABS Take 1 tablet by mouth daily. 12/16/14   Minna Merritts, MD  gabapentin (NEURONTIN) 100 MG capsule TAKE 2 CAPSULES (200 MG TOTAL) BY MOUTH 3 (THREE) TIMES DAILY. 04/27/16   Truitt Merle, MD  glimepiride (AMARYL) 2 MG tablet Take 2 mg by mouth daily with breakfast.     Historical Provider, MD  HYDROcodone-acetaminophen (NORCO/VICODIN) 5-325 MG tablet Take 1 tablet by mouth every 8 (eight) hours as needed for moderate pain. Patient not taking: Reported on 05/09/2016 01/31/16   Truitt Merle, MD  HYDROcodone-homatropine [facility]) 5-1.5 MG/5ML syrup Take 5 mLs by mouth every 6 (six) hours as needed for cough. Patient not taking: Reported on 05/09/2016 10/14/15   Susanne Borders, NP    losartan (COZAAR) 100 MG tablet Take 1 tablet (100 mg total) by mouth daily. 08/08/15   Minna Merritts, MD  metFORMIN (GLUCOPHAGE) 1000 MG tablet Take 1,000 mg by mouth 2 (two) times daily.      Historical Provider, MD  ondansetron (ZOFRAN) 8 MG tablet Take 1 tablet (8 mg total) by mouth 2 (two) times daily as needed for nausea or vomiting. Take 8 mg by mouth 2 times daily for 3 days and as needed for nausea -  Start on third  day after chemo . Patient not taking: Reported on 05/09/2016 11/03/15   Truitt Merle, MD  prochlorperazine (COMPAZINE) 10 MG tablet Take 1 tablet (10 mg total) by mouth every 6 (six) hours as needed for nausea or vomiting. Patient not taking: Reported on 05/09/2016 11/03/15   Truitt Merle, MD    Allergies Ace inhibitors and Sulfa antibiotics  Family History  Problem Relation Age of Onset  . Diabetes type II Mother   .  Breast cancer Mother     survivor  . Hypertension Mother   . Coronary artery disease Father   . Uterine cancer Paternal Grandmother     Social History Social History  Substance Use Topics  . Smoking status: Never Smoker  . Smokeless tobacco: Never Used  . Alcohol use No     Comment: RARE    Review of Systems  Constitutional: Negative for fever. Eyes: Negative for visual changes. ENT: Negative for sore throat. Cardiovascular: Negative for chest pain. Respiratory: Negative for shortness of breath. Gastrointestinal: Negative for abdominal pain, vomiting or diarrhea. Genitourinary: Negative for dysuria. Musculoskeletal: Negative for back pain. Skin: Negative for rash. Neurological: Negative for headaches, weakness or numbness. + aphasia  ____________________________________________   PHYSICAL EXAM:  VITAL SIGNS: ED Triage Vitals  Enc Vitals Group     BP 05/09/16 1926 (!) 132/93     Pulse Rate 05/09/16 1926 83     Resp 05/09/16 1926 18     Temp 05/09/16 1926 98.1 F (36.7 C)     Temp Source 05/09/16 1926 Oral     SpO2 05/09/16 1926 95  %     Weight 05/09/16 1927 199 lb 3.2 oz (90.4 kg)     Height 05/09/16 1927 5\' 10"  (1.778 m)     Head Circumference --      Peak Flow --      Pain Score 05/09/16 1927 0     Pain Loc --      Pain Edu? --      Excl. in [place]? --     Constitutional: Alert and oriented. Well appearing and in no apparent distress. HEENT:      Head: Normocephalic and atraumatic.         Eyes: Conjunctivae are normal. Sclera is non-icteric. EOMI. PERRL      Mouth/Throat: Mucous membranes are moist.       Neck: Supple with no signs of meningismus. Cardiovascular: Regular rate and rhythm. No murmurs, gallops, or rubs. 2+ symmetrical distal pulses are present in all extremities. No JVD. Respiratory: Normal respiratory effort. Lungs are clear to auscultation bilaterally. No wheezes, crackles, or rhonchi.  Gastrointestinal: Soft, non tender, and non distended with positive bowel sounds. No rebound or guarding. Musculoskeletal: Nontender with normal range of motion in all extremities. No edema, cyanosis, or erythema of extremities. Neurologic: Normal speech and language. A & O x3, PERRL, no nystagmus, CN II-XII intact, motor testing reveals good tone and bulk throughout. There is no evidence of pronator drift or dysmetria. Muscle strength is 5/5 throughout. Deep tendon reflexes are 2+ throughout with downgoing toes. Sensory examination is intact. Gait is normal. Skin: Skin is warm, dry and intact. No rash noted. Psychiatric: Mood and affect are normal. Speech and behavior are normal.  ____________________________________________   LABS (all labs ordered are listed, but only abnormal results are displayed)  Labs Reviewed  GLUCOSE, CAPILLARY - Abnormal; Notable for the following:       Result Value   Glucose-Capillary 128 (*)    All other components within normal limits  CBC WITH DIFFERENTIAL/PLATELET - Abnormal; Notable for the following:    RBC 3.76 (*)    HCT 37.4 (*)    MCH 34.5 (*)    All other components  within normal limits  BASIC METABOLIC PANEL - Abnormal; Notable for the following:    Glucose, Bld 144 (*)    BUN 33 (*)    Creatinine, Ser 1.47 (*)    GFR calc non  Af Amer 52 (*)    GFR calc Af Amer 60 (*)    All other components within normal limits  TROPONIN I  PROTIME-INR  APTT   ____________________________________________  EKG  ED ECG REPORT I, Rudene Re, the attending physician, personally viewed and interpreted this ECG.  Normal sinus rhythm, rate of 85, normal intervals, normal axis, no ST elevations or depressions. ____________________________________________  RADIOLOGY  Head CT: Multiple hyper attenuating foci throughout the brain parenchyma most concerning for metastatic disease. Difficult to exclude petechial hemorrhage within some of these lesions. Recommend brain MRI with and without contrast for further evaluation  CXR: Irregular nodular density peripheral right lung base likely representing the lesions seen on previous CT scan. Otherwise no acute cardiopulmonary findings. ____________________________________________   PROCEDURES  Procedure(s) performed: None Procedures Critical Care performed:  None ____________________________________________   INITIAL IMPRESSION / ASSESSMENT AND PLAN / ED COURSE  55 y.o. male history of CAD, metastatic colorectal cancer currently undergoing hemotherapy at [facility], HIV, DM, HTN, HLD who presents for evaluation of aphasia starting at 6:30PM lasting 25 min. patient is neurologically intact at this time. Will pursue head CT, basic labs and admit patient for TIA workup.  Clinical Course as of May 09 2100  Wed May 09, 2016  2021 CT showing multiple hyperattenuating brain lesions concerning for brain mets. Patient has been updated of these results. Since patient's care is done at [facility], will call them to transfer patient. MRI ordered STAT  [CV]    Clinical Course User Index [CV] Rudene Re,  MD    8:30 PM  I spoke with Dr. Irene Limbo, oncology at [facility] who recommended starting patient on decadron 10mg  q 6hrs and transfer patient to ED. Spoke with Dr. Janice Norrie, hospitalist at [facility] who accepted transfer of patient. Patient undergoing MRI at this time.   Pertinent labs & imaging results that were available during my care of the patient were reviewed by me and considered in my medical decision making (see chart for details).    ____________________________________________   FINAL CLINICAL IMPRESSION(S) / ED DIAGNOSES  Final diagnoses:  Metastasis to brain [facility]      NEW MEDICATIONS STARTED DURING THIS VISIT:  New Prescriptions   No medications on file     Note:  This document was prepared using Dragon voice recognition software and may include unintentional dictation errors.    Rudene Re, MD 05/09/16 2101

## 2016-05-09 NOTE — H&P (Signed)
History and Physical    Julian Palmer [id number] DOB: 1960-07-06 DOA: 05/09/2016  PCP: Leonel Ramsay, MD  Patient coming from: Home   Chief Complaint: difficulty finding words.   HPI: Julian Palmer is a 55 y.o. male with medical history significant of history of rectal cancer, Type 2 DM, CAD s/p stent ,HIV who presents to ED at [facility] with difficulty finding words that lasted for 20 minutes. Patient is now at baseline. He denies any history of headaches,no fevers,no history of confusion.  He is complaint with his medications.  ED Course: CT head showed Multiple hyper attenuating foci throughout the brain parenchyma most concerning for metastatic disease. Difficult to exclude petechial hemorrhage within some of these lesions. Recommend brain MRI with and without contrast for further evaluation. ED physician discussed case withon call oncologist who recommend for patient to be transfer to [facility] long for further evaluation. Also IV decadron was recommended. Subsequently patient had MRI done which showed Innumerable parenchymal contrast-enhancing lesions throughout the brain. The appearance is most consistent with metastatic disease.However, given the patient's HIV status, other considerations include infection, particularly toxoplasmosis, and CNS lymphoma. No evidence of acute hemorrhage. No midline shift or other evidence of herniation.  Review of Systems: As per HPI otherwise 10 point review of systems negative.    Past Medical History:  Diagnosis Date  . Anxiety   . At risk for sleep apnea    STOP-BANG= 4     SENT TO PCP 03-23-2014  . Coronary artery disease CARDIOLOGIST--  DR Rockey Situ --  LAST LOV 04-17-2013   S/P  PCI to mid and distal LAD and BM stent x1 to pLAD  . Depression   . ED (erectile dysfunction)   . History of carcinoma in situ of anal canal    2010  . History of rectal cancer    04/ 2012--  invasive squamous cell carcinoma at 9 o'clock position, poorly  differentiated s/p resection and  30 cylces radiation / chemo therapy for 2 weeks  . HIV positive ([place])   . Hyperlipidemia   . Hypertension   . PONV (postoperative nausea and vomiting)   . Primary cancer of anal canal ([place]) 05/18/2011  . Radiation 01/2011   definitive dose RT 45 Gy over 5 weeks with 16Gy boosting  . Radiation 09/01/15-09/22/15   right sacrum 35 Gy  . 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 S/p bare metal coronary artery stent    1998  pLAD  . Sigmoid diverticulosis   . Type 2 diabetes mellitus ([place])   . Wears glasses     Past Surgical History:  Procedure Laterality Date  . CARDIOVASCULAR STRESS TEST  04/ 2010   low risk study/  very small region of mildly reduced perfersion is mildly reversible dLAD territory  . COLONOSCOPY WITH PROPOFOL  05-28-2008  . CORONARY ANGIOPLASTY WITH STENT PLACEMENT  1998   [facility]   BM stent to pLAD and PCI to Mid and Distal LAD/  no other significant cad  . EUA W/ BX PERIANAL SKIN  06-08-2009  . EXCISION ANAL POLYP  2009  . INGUINAL HERNIA REPAIR Bilateral 1988  . LAPAROSCOPIC ASSISTED ABDOMINAL PERINEAL RESECTION N/A 04/22/2014   Procedure: LAPAROSCOPIC  ABDOMINAL PERINEAL RESECTION, ;  Surgeon: Leighton Ruff, MD;  Location: [facility];  Service: General;  Laterality: N/A;  . MASS EXCISION N/A 03/26/2014   Procedure: ANAL EXAM UNDER ANESTHESIA,EXCISIONAL BIOPSY OF ANAL MASS;  Surgeon: Elmo Putt  Marcello Moores, MD;  Location: [facility];  Service: General;  Laterality: N/A;  . MUSCLE FLAP CLOSURE N/A 04/22/2014   Procedure: VRAM FLAP;  Surgeon: Irene Limbo, MD;  Location: [facility];  Service: Plastics;  Laterality: N/A;  . PORT-A-CATH PLACEMENT  02-21-2011   REMOVED  2013  . PORTACATH PLACEMENT Left 06/24/2014   Procedure: INSERTION PORT-A-CATH LEFT SUBCLAVIAN;  Surgeon: Leighton Ruff, MD;  Location: [facility];  Service: General;  Laterality: Left;  . RE-EXCISION ANAL SQUAMOUS CELL CARCINOMA  2010  .  REMOVAL RECTAL MASS  01-08-2014  . RESECTION ANAL MASS  02-05-2011  . SEPTOPLASTY  1985  . SPHINCTEROTOMY  2009  . URETHROTOMY Bilateral 04/22/2014   Procedure: CYSTOSCOPY/URETHROTOMY;  Surgeon: Leighton Ruff, MD;  Location: [facility];  Service: General;  Laterality: Bilateral;     reports that he has never smoked. He has never used smokeless tobacco. He reports that he does not drink alcohol or use drugs.  Allergies  Allergen Reactions  . Ace Inhibitors Other (See Comments)    Other reaction(s): Unknown  . Sulfa Antibiotics Hives    Family History  Problem Relation Age of Onset  . Diabetes type II Mother   . Breast cancer Mother     survivor  . Hypertension Mother   . Coronary artery disease Father   . Uterine cancer Paternal Grandmother     Prior to Admission medications   Medication Sig Start Date End Date Taking? Authorizing Provider  aspirin (ASPIR-81) 81 MG EC tablet Take 81 mg by mouth at bedtime.     Historical Provider, MD  atorvastatin (LIPITOR) 80 MG tablet Take 1 tablet (80 mg total) by mouth every evening. 08/08/15   Minna Merritts, MD  Blood Glucose Monitoring Suppl (FREESTYLE LITE) [facility]  12/14/15 12/13/16  Historical Provider, MD  citalopram (CELEXA) 20 MG tablet Take by mouth. 12/06/15 12/05/16  Historical Provider, MD  efavirenz-emtrictabine-tenofovir (ATRIPLA) 600-200-300 MG per tablet Take 1 tablet by mouth at bedtime.     Historical Provider, MD  Empagliflozin-Linagliptin (GLYXAMBI) 25-5 MG TABS Take 1 tablet by mouth daily. 12/16/14   Minna Merritts, MD  gabapentin (NEURONTIN) 100 MG capsule TAKE 2 CAPSULES (200 MG TOTAL) BY MOUTH 3 (THREE) TIMES DAILY. 04/27/16   Truitt Merle, MD  glimepiride (AMARYL) 2 MG tablet Take 2 mg by mouth daily with breakfast.     Historical Provider, MD  HYDROcodone-acetaminophen (NORCO/VICODIN) 5-325 MG tablet Take 1 tablet by mouth every 8 (eight) hours as needed for moderate pain. Patient not taking: Reported on 05/09/2016 01/31/16   Truitt Merle, MD  HYDROcodone-homatropine [facility]) 5-1.5 MG/5ML syrup Take 5 mLs by mouth every 6 (six) hours as needed for cough. Patient not taking: Reported on 05/09/2016 10/14/15   Susanne Borders, NP  losartan (COZAAR) 100 MG tablet Take 1 tablet (100 mg total) by mouth daily. 08/08/15   Minna Merritts, MD  metFORMIN (GLUCOPHAGE) 1000 MG tablet Take 1,000 mg by mouth 2 (two) times daily.      Historical Provider, MD  ondansetron (ZOFRAN) 8 MG tablet Take 1 tablet (8 mg total) by mouth 2 (two) times daily as needed for nausea or vomiting. Take 8 mg by mouth 2 times daily for 3 days and as needed for nausea -  Start on third  day after chemo . Patient not taking: Reported on 05/09/2016 11/03/15   Truitt Merle, MD  prochlorperazine (COMPAZINE) 10 MG tablet Take 1 tablet (10 mg total) by mouth every 6 (  six) hours as needed for nausea or vomiting. Patient not taking: Reported on 05/09/2016 11/03/15   Truitt Merle, MD    Physical Exam: Vitals:   05/09/16 2315  BP: (!) 125/92  Pulse: 85  Resp: 16  Temp: 97.8 F (36.6 C)  TempSrc: Oral  SpO2: 96%      Constitutional: NAD, calm, comfortable Vitals:   05/09/16 2315  BP: (!) 125/92  Pulse: 85  Resp: 16  Temp: 97.8 F (36.6 C)  TempSrc: Oral  SpO2: 96%   Eyes: PERRL, lids and conjunctivae normal ENMT: Mucous membranes are moist. Posterior pharynx clear of any exudate or lesions.Normal dentition.  Neck: normal, supple, no masses, no thyromegaly Respiratory: clear to auscultation bilaterally, no wheezing, no crackles. Normal respiratory effort. No accessory muscle use.  Cardiovascular: Regular rate and rhythm, no murmurs / rubs / gallops. No extremity edema. 2+ pedal pulses. No carotid bruits.  Abdomen: no tenderness, no masses palpated. No hepatosplenomegaly. Bowel sounds positive. Colostomy in place with bag.  Musculoskeletal: no clubbing / cyanosis. No joint deformity upper and lower extremities. Good ROM, no contractures. Normal muscle tone.    Skin: no rashes, lesions, ulcers. No induration Neurologic: CN 2-12 grossly intact. Sensation intact, DTR normal. Strength 5/5 in all 4.  Psychiatric: Normal judgment and insight. Alert and oriented x 3. Normal mood.     Labs on Admission: I have personally reviewed following labs and imaging studies  CBC:  Recent Labs Lab 05/09/16 0755 05/09/16 1930  WBC 4.4 5.0  NEUTROABS 2.8 2.6  HGB 12.2* 13.0  HCT 35.8* 37.4*  MCV 98.6* 99.2  PLT 197 0000000   Basic Metabolic Panel:  Recent Labs Lab 05/09/16 0755 05/09/16 1930  NA 135* 135  K 4.0 3.8  CL  --  101  CO2 20* 25  GLUCOSE 207* 144*  BUN 27.7* 33*  CREATININE 1.3 1.47*  CALCIUM 9.3 9.1   GFR: Estimated Creatinine Clearance: 63.5 mL/min (by C-G formula based on SCr of 1.47 mg/dL (H)). Liver Function Tests:  Recent Labs Lab 05/09/16 0755  AST 29  ALT 30  ALKPHOS 179*  BILITOT 0.34  PROT 8.0  ALBUMIN 3.6   No results for input(s): LIPASE, AMYLASE in the last 168 hours. No results for input(s): AMMONIA in the last 168 hours. Coagulation Profile:  Recent Labs Lab 05/09/16 1930  INR 0.98   Cardiac Enzymes:  Recent Labs Lab 05/09/16 1930  TROPONINI <0.03   BNP (last 3 results) No results for input(s): PROBNP in the last 8760 hours. HbA1C: No results for input(s): HGBA1C in the last 72 hours. CBG:  Recent Labs Lab 05/09/16 1924  GLUCAP 128*   Lipid Profile: No results for input(s): CHOL, HDL, LDLCALC, TRIG, CHOLHDL, LDLDIRECT in the last 72 hours. Thyroid Function Tests: No results for input(s): TSH, T4TOTAL, FREET4, T3FREE, THYROIDAB in the last 72 hours. Anemia Panel: No results for input(s): VITAMINB12, FOLATE, FERRITIN, TIBC, IRON, RETICCTPCT in the last 72 hours. Urine analysis: No results found for: COLORURINE, APPEARANCEUR, LABSPEC, PHURINE, GLUCOSEU, HGBUR, BILIRUBINUR, KETONESUR, PROTEINUR, UROBILINOGEN, NITRITE, LEUKOCYTESUR Sepsis Labs:  !!!!!!!!!!!!!!!!!!!!!!!!!!!!!!!!!!!!!!!!!!!! @LABRCNTIP (procalcitonin:4,lacticidven:4) )No results found for this or any previous visit (from the past 240 hour(s)).   Radiological Exams on Admission: Dg Chest 1 View  Result Date: 05/09/2016 CLINICAL DATA:  Weakness. EXAM: CHEST 1 VIEW COMPARISON:  Chest CT 12/16/2015.  Chest x-ray 06/24/2014. FINDINGS: Ill-defined nodule peripheral right base compatible with the right middle lobe lesions seen on previous CT scan. No other focal airspace consolidation. No  pulmonary edema or pleural effusion. The cardiopericardial silhouette is within normal limits for size. Left Port-A-Cath tip overlies the proximal SVC. Bones are diffusely demineralized. Telemetry leads overlie the chest. IMPRESSION: Irregular nodular density peripheral right lung base likely representing the lesions seen on previous CT scan. Otherwise no acute cardiopulmonary findings. Electronically Signed   By: Misty Stanley M.D.   On: 05/09/2016 20:15   Ct Head Wo Contrast  Result Date: 05/09/2016 CLINICAL DATA:  Acute onset altered mental status earlier today. Concern for stroke. Active chemotherapy for rectal cancer. EXAM: CT HEAD WITHOUT CONTRAST TECHNIQUE: Contiguous axial images were obtained from the base of the skull through the vertex without intravenous contrast. COMPARISON:  None. FINDINGS: Brain: There multiple small foci of abnormal increased density throughout the brain most concerning for metastatic disease. Largest apparent lesion in the right parietal lobe measures 1.4 cm. Many of these appear subcentimeter. Difficult to exclude petechial hemorrhage within the lesions. No significant midline shift. No hydrocephalus. No evidence of large vessel ischemia. Vascular: No hyperdense vessel. Skull: No evidence of osseous metastatic disease. Sinuses/Orbits: No acute finding. Other: None. IMPRESSION: Multiple hyper attenuating foci throughout the brain parenchyma most concerning for  metastatic disease. Difficult to exclude petechial hemorrhage within some of these lesions. Recommend brain MRI with and without contrast for further evaluation. These results were called by telephone at the time of interpretation on 05/09/2016 at 8:05 pm to Dr. Rudene Re , who verbally acknowledged these results. Electronically Signed   By: Jeb Levering M.D.   On: 05/09/2016 20:05   Mr Brain W And Wo Contrast  Result Date: 05/09/2016 CLINICAL DATA:  Speech difficulties.  Difficulty standing. EXAM: MRI HEAD WITHOUT AND WITH CONTRAST TECHNIQUE: Multiplanar, multiecho pulse sequences of the brain and surrounding structures were obtained without and with intravenous contrast. CONTRAST:  69mL MULTIHANCE GADOBENATE DIMEGLUMINE 529 MG/ML IV SOLN COMPARISON:  Head CT 05/09/2016 FINDINGS: Brain: There are innumerable ring-enhancing foci scattered throughout all areas of the supratentorial and infratentorial brain. Multiple lesion show a degree of diffusion restriction with central low T2 weighted signal. The largest lesion is located in the right parietal lobe measures 1.6 x 1.3 cm. There is no evidence of hemorrhage. No midline shift. No hydrocephalus or extra-axial fluid collection. No age advanced or lobar predominant atrophy. Vascular: Major intracranial arterial and venous sinus flow voids are preserved. No evidence of chronic microhemorrhage or amyloid angiopathy. Skull and upper cervical spine: The visualized skull base, calvarium, upper cervical spine and extracranial soft tissues are normal. Sinuses/Orbits: No fluid levels or advanced mucosal thickening. No mastoid effusion. Normal orbits. IMPRESSION: 1. Innumerable parenchymal contrast-enhancing lesions throughout the brain. The appearance is most consistent with metastatic disease. However, given the patient's HIV status, other considerations include infection, particularly toxoplasmosis, and CNS lymphoma. 2. No evidence of acute hemorrhage. No  midline shift or other evidence of herniation. Electronically Signed   By: Ulyses Jarred M.D.   On: 05/09/2016 21:54    EKG: none available.   Assessment/Plan Active Problems:   Primary cancer of anal canal (HCC)   HIV (human immunodeficiency virus infection) ([place])   Type 2 diabetes mellitus ([place])   Aphasia   Brain lesion  1-Multiples Brain Lesions;  This could be metastasisdiseases, with patient history opportunistic infection is possibility. Patient with no history of fever or confusion. He is complaint with his medications, and he report that he viral load is undetectable.  Check CD 4, viral load.  Toxoplasmosis IgM.  Will hold on IV  steroids due to possibility for infection. Needs further discussion with ID>  Need Oncology and ID consultation.   2-HIV; continue with current medications.   3-AKI; IV fluids.  Hold cozaar.   4-CAD,continue with statins.   5-History of anal cancer; follows with Dr Burr Medico   6-DM; hold metformin. SSI>     DVT prophylaxis: Lovenox.  Code Status: Full code for now, he has living will.  Family Communication: care discussed with patient. He does not wants his medical information to be disclose.  Disposition Plan: to be determine.  Consults called: need to call ID and oncology in am.  Admission status: inpatient, med-surgery    Elmarie Shiley MD [facility] Pager [phone]  If 7PM-7AM, please contact night-coverage [email or web address] Password [email or web address]  05/09/2016, 11:46 PM

## 2016-05-09 NOTE — ED Triage Notes (Signed)
Pt was at church and started talking funny and not making any sense. Pt was not able to stand up and could not get up steady. Pt has a hx of rectal cancer and activly being treated with chemo at [facility].

## 2016-05-10 ENCOUNTER — Encounter (HOSPITAL_COMMUNITY): Payer: Self-pay | Admitting: *Deleted

## 2016-05-10 DIAGNOSIS — R569 Unspecified convulsions: Secondary | ICD-10-CM

## 2016-05-10 DIAGNOSIS — C211 Malignant neoplasm of anal canal: Secondary | ICD-10-CM

## 2016-05-10 DIAGNOSIS — R4701 Aphasia: Secondary | ICD-10-CM

## 2016-05-10 DIAGNOSIS — E119 Type 2 diabetes mellitus without complications: Secondary | ICD-10-CM

## 2016-05-10 DIAGNOSIS — B20 Human immunodeficiency virus [HIV] disease: Secondary | ICD-10-CM

## 2016-05-10 DIAGNOSIS — C7951 Secondary malignant neoplasm of bone: Secondary | ICD-10-CM

## 2016-05-10 DIAGNOSIS — C7931 Secondary malignant neoplasm of brain: Secondary | ICD-10-CM

## 2016-05-10 LAB — GLUCOSE, CAPILLARY
GLUCOSE-CAPILLARY: 146 mg/dL — AB (ref 65–99)
Glucose-Capillary: 197 mg/dL — ABNORMAL HIGH (ref 65–99)
Glucose-Capillary: 140 mg/dL — ABNORMAL HIGH (ref 65–99)
Glucose-Capillary: 194 mg/dL — ABNORMAL HIGH (ref 65–99)

## 2016-05-10 LAB — T-HELPER CELLS (CD4) COUNT (NOT AT ARMC)
CD4 % Helper T Cell: 22 % — ABNORMAL LOW (ref 33–55)
CD4 T CELL ABS: 110 /uL — AB (ref 400–2700)

## 2016-05-10 MED ORDER — SODIUM CHLORIDE 0.9 % IV SOLN
1000.0000 mg | Freq: Once | INTRAVENOUS | Status: AC
  Administered 2016-05-10: 1000 mg via INTRAVENOUS
  Filled 2016-05-10: qty 10

## 2016-05-10 MED ORDER — INSULIN ASPART 100 UNIT/ML ~~LOC~~ SOLN
0.0000 [IU] | Freq: Three times a day (TID) | SUBCUTANEOUS | Status: DC
  Administered 2016-05-10: 1 [IU] via SUBCUTANEOUS
  Administered 2016-05-10 (×2): 2 [IU] via SUBCUTANEOUS
  Administered 2016-05-11: 1 [IU] via SUBCUTANEOUS
  Administered 2016-05-11: 3 [IU] via SUBCUTANEOUS

## 2016-05-10 MED ORDER — LEVETIRACETAM 500 MG PO TABS
500.0000 mg | ORAL_TABLET | Freq: Two times a day (BID) | ORAL | Status: DC
  Administered 2016-05-10 – 2016-05-11 (×2): 500 mg via ORAL
  Filled 2016-05-10 (×3): qty 1

## 2016-05-10 NOTE — Progress Notes (Signed)
Julian Palmer   DOB:04/13/1961   [id number]   [id number]  ONCOLOGY FOLLOW UP NOTE   Subjective: Patient is well-known to me, was seen by me yesterday in the office for follow-up. He developed sudden onset aphasia, and slight confusion, when he was in church last night, his symptoms lasted about 20 minutes, resolved spontaneously. He was admitted to [facility] last night. He feels well today, denies any headaches, or other new symptoms.   Objective:  Vitals:   05/10/16 1430 05/10/16 2118  BP: 136/88 108/62  Pulse: 78 81  Resp: 18 18  Temp: 98 F (36.7 C) 98.2 F (36.8 C)    There is no height or weight on file to calculate BMI.  Intake/Output Summary (Last 24 hours) at 05/10/16 2138 Last data filed at 05/10/16 1800  Gross per 24 hour  Intake              450 ml  Output             1100 ml  Net             -650 ml     Sclerae unicteric  Oropharynx clear  No peripheral adenopathy  Lungs clear -- no rales or rhonchi  Heart regular rate and rhythm  Abdomen benign  MSK no focal spinal tenderness, no peripheral edema  Neuro nonfocal    CBG (last 3)   Recent Labs  05/10/16 0758 05/10/16 1137 05/10/16 1731  GLUCAP 197* 140* 194*     Labs:  Lab Results  Component Value Date   WBC 5.0 05/09/2016   HGB 13.0 05/09/2016   HCT 37.4 (L) 05/09/2016   MCV 99.2 05/09/2016   PLT 246 05/09/2016   NEUTROABS 2.6 05/09/2016    Urine Studies No results for input(s): UHGB, CRYS in the last 72 hours.  Invalid input(s): UACOL, UAPR, USPG, UPH, UTP, UGL, UKET, UBIL, UNIT, UROB, ULEU, UEPI, UWBC, URBC, UBAC, CAST, UCOM, BILUA  Basic Metabolic Panel:  Recent Labs Lab 05/09/16 0755 05/09/16 1930  NA 135* 135  K 4.0 3.8  CL  --  101  CO2 20* 25  GLUCOSE 207* 144*  BUN 27.7* 33*  CREATININE 1.3 1.47*  CALCIUM 9.3 9.1   GFR Estimated Creatinine Clearance: 63.5 mL/min (by C-G formula based on SCr of 1.47 mg/dL (H)). Liver Function Tests:  Recent Labs Lab  05/09/16 0755  AST 29  ALT 30  ALKPHOS 179*  BILITOT 0.34  PROT 8.0  ALBUMIN 3.6   No results for input(s): LIPASE, AMYLASE in the last 168 hours. No results for input(s): AMMONIA in the last 168 hours. Coagulation profile  Recent Labs Lab 05/09/16 1930  INR 0.98    CBC:  Recent Labs Lab 05/09/16 0755 05/09/16 1930  WBC 4.4 5.0  NEUTROABS 2.8 2.6  HGB 12.2* 13.0  HCT 35.8* 37.4*  MCV 98.6* 99.2  PLT 197 246   Cardiac Enzymes:  Recent Labs Lab 05/09/16 1930  TROPONINI <0.03   BNP: Invalid input(s): POCBNP CBG:  Recent Labs Lab 05/09/16 1924 05/10/16 0758 05/10/16 1137 05/10/16 1731  GLUCAP 128* 197* 140* 194*   D-Dimer No results for input(s): DDIMER in the last 72 hours. Hgb A1c No results for input(s): HGBA1C in the last 72 hours. Lipid Profile No results for input(s): CHOL, HDL, LDLCALC, TRIG, CHOLHDL, LDLDIRECT in the last 72 hours. Thyroid function studies No results for input(s): TSH, T4TOTAL, T3FREE, THYROIDAB in the last 72 hours.  Invalid input(s): FREET3 Anemia  work up No results for input(s): VITAMINB12, FOLATE, FERRITIN, TIBC, IRON, RETICCTPCT in the last 72 hours. Microbiology No results found for this or any previous visit (from the past 240 hour(s)).    Studies:  Dg Chest 1 View  Result Date: 05/09/2016 CLINICAL DATA:  Weakness. EXAM: CHEST 1 VIEW COMPARISON:  Chest CT 12/16/2015.  Chest x-ray 06/24/2014. FINDINGS: Ill-defined nodule peripheral right base compatible with the right middle lobe lesions seen on previous CT scan. No other focal airspace consolidation. No pulmonary edema or pleural effusion. The cardiopericardial silhouette is within normal limits for size. Left Port-A-Cath tip overlies the proximal SVC. Bones are diffusely demineralized. Telemetry leads overlie the chest. IMPRESSION: Irregular nodular density peripheral right lung base likely representing the lesions seen on previous CT scan. Otherwise no acute  cardiopulmonary findings. Electronically Signed   By: Misty Stanley M.D.   On: 05/09/2016 20:15   Ct Head Wo Contrast  Result Date: 05/09/2016 CLINICAL DATA:  Acute onset altered mental status earlier today. Concern for stroke. Active chemotherapy for rectal cancer. EXAM: CT HEAD WITHOUT CONTRAST TECHNIQUE: Contiguous axial images were obtained from the base of the skull through the vertex without intravenous contrast. COMPARISON:  None. FINDINGS: Brain: There multiple small foci of abnormal increased density throughout the brain most concerning for metastatic disease. Largest apparent lesion in the right parietal lobe measures 1.4 cm. Many of these appear subcentimeter. Difficult to exclude petechial hemorrhage within the lesions. No significant midline shift. No hydrocephalus. No evidence of large vessel ischemia. Vascular: No hyperdense vessel. Skull: No evidence of osseous metastatic disease. Sinuses/Orbits: No acute finding. Other: None. IMPRESSION: Multiple hyper attenuating foci throughout the brain parenchyma most concerning for metastatic disease. Difficult to exclude petechial hemorrhage within some of these lesions. Recommend brain MRI with and without contrast for further evaluation. These results were called by telephone at the time of interpretation on 05/09/2016 at 8:05 pm to Dr. Rudene Re , who verbally acknowledged these results. Electronically Signed   By: Jeb Levering M.D.   On: 05/09/2016 20:05   Mr Brain W And Wo Contrast  Result Date: 05/09/2016 CLINICAL DATA:  Speech difficulties.  Difficulty standing. EXAM: MRI HEAD WITHOUT AND WITH CONTRAST TECHNIQUE: Multiplanar, multiecho pulse sequences of the brain and surrounding structures were obtained without and with intravenous contrast. CONTRAST:  55mL MULTIHANCE GADOBENATE DIMEGLUMINE 529 MG/ML IV SOLN COMPARISON:  Head CT 05/09/2016 FINDINGS: Brain: There are innumerable ring-enhancing foci scattered throughout all areas of  the supratentorial and infratentorial brain. Multiple lesion show a degree of diffusion restriction with central low T2 weighted signal. The largest lesion is located in the right parietal lobe measures 1.6 x 1.3 cm. There is no evidence of hemorrhage. No midline shift. No hydrocephalus or extra-axial fluid collection. No age advanced or lobar predominant atrophy. Vascular: Major intracranial arterial and venous sinus flow voids are preserved. No evidence of chronic microhemorrhage or amyloid angiopathy. Skull and upper cervical spine: The visualized skull base, calvarium, upper cervical spine and extracranial soft tissues are normal. Sinuses/Orbits: No fluid levels or advanced mucosal thickening. No mastoid effusion. Normal orbits. IMPRESSION: 1. Innumerable parenchymal contrast-enhancing lesions throughout the brain. The appearance is most consistent with metastatic disease. However, given the patient's HIV status, other considerations include infection, particularly toxoplasmosis, and CNS lymphoma. 2. No evidence of acute hemorrhage. No midline shift or other evidence of herniation. Electronically Signed   By: Ulyses Jarred M.D.   On: 05/09/2016 21:54    Assessment: 55 y.o. 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 His symptoms resolved in 20-30 minutes. Asymptomatic now, his brain MRI showed innumerable contrast enhancing lesion throughout the brain, concerning for metastatic disease.    Plan:  -I spoke with Dr. Sheran Fava this morning, who called ID and neurology. The consensus about his brain lesions is likely metastatic disease from his anal cancer, which has known metastatic disease. ID felt this is unlikely toxoplasmosis given his well controlled HIV. His repeated CD4 counts and HIV RNA results are still pending. If his CD4 count is low, I strongly suggest formal ID consult to see if LP or other  work up is needed to rule out infection -I have discussed with his rad/onc Dr. Sondra Come this morning. We will review his scan in our brain tumor board next Monday. If this is most consistent with brain metastasis, he is likely going to have whole brain radiation  -If no contraindication from ID, will recommend dexamethasone 4mg  daily (given no neuro symptoms)  -If no other work up needed, OK to discharge home from oncology standpoint. Dr. Sondra Come is going to see him in clinic on Monday 11/21 -I spoke with pt, his sister and mother at his bed side today, questions were answered.    Truitt Merle, MD 05/10/2016

## 2016-05-10 NOTE — Progress Notes (Signed)
PROGRESS NOTE  Julian Palmer  [id number] DOB: [birth year] DOA: 05/09/2016 PCP: Leonel Ramsay, MD  Brief Narrative:   Julian Palmer is a 55 y.o. male with medical history significant of history of metastatic rectal cancer that responded to his last cycle of chemotherapy, followed by Dr. Burr Medico, Type 2 DM, CAD s/p stent, and HIV (normal CD4 count in 600-700 range and undetectable VL) who presents to ED at [facility] with difficulty finding words that lasted for 20 minutes.  He denied associated numbness, tingling, or weakness.  CT head showed Multiple hyper attenuating foci throughout the brain parenchyma most concerning for metastatic disease.  MRI confirmed ring-enhancing lesions c/w metastatic cancer.  Case was discussed with Neurology who recommended starting Keppra (no need for EEG) for probable partial seizure induced by some of the temporal lobe lesions seen on MRI.  Case was also discussed with infectious disease who did not feel that the patient was high risk for toxoplasmosis if his CD4 counts have been consistently normal.  Oncology is consulting and radiation oncology has been consulted by Oncology.  Awaiting initiation of dexamethasone pending results of CD4.    Assessment & Plan:   Principal Problem:   Aphasia Active Problems:   Primary cancer of anal canal (HCC)   HIV (human immunodeficiency virus infection) ([place])   Type 2 diabetes mellitus ([place])   Brain lesion   Transient aphasia, possibly related to seizure -  Start keppra -  No further work up for TIA  Multiple brain lesions, likely metastatic rectal cancer.  Unless CD4 count is very low, unlikely to be toxoplasmosis.   -  If CD4 count is low, will need lumbar puncture -  If CD4 count is above 200, will start dexamethasone 4mg  daily -  XRT ONC consult pending -  Appreciate oncology assistance  HIV, awaiting viral load and CD4 count -  Continue ART  AKI, continue IVF -  Repeat BMP in AM  CAD, stable,  continue statin.  Unclear why not on aspirin  T2DM, holding metformin -  Continue SSI  DVT prophylaxis:  lovenox Code Status:  full Family Communication:  Patient alone.  He does not wants his medical information to be disclose.  Disposition Plan:  Possibly home tomorrow.  Pending CD4 count   Consultants:   Oncology, Dr. Burr Medico  Procedures:  none  Antimicrobials:   none    Subjective: Feeling back to baseline.  Denies recurrent episodes of speech difficulty.  Objective: Vitals:   05/09/16 2315 05/10/16 0524  BP: (!) 125/92 113/71  Pulse: 85 82  Resp: 16 16  Temp: 97.8 F (36.6 C) 98.1 F (36.7 C)  TempSrc: Oral Oral  SpO2: 96% 94%    Intake/Output Summary (Last 24 hours) at 05/10/16 1307 Last data filed at 05/10/16 0846  Gross per 24 hour  Intake              240 ml  Output              500 ml  Net             -260 ml   There were no vitals filed for this visit.  Examination:  General exam:  Adult male.  No acute distress.  HEENT:  NCAT, MMM Respiratory system: Clear to auscultation bilaterally Cardiovascular system: Regular rate and rhythm, normal S1/S2. No murmurs, rubs, gallops or clicks.  Warm extremities Gastrointestinal system: Normal active bowel sounds, soft, nondistended, nontender.  Ostomy with soft stool  in the left lower quadrant MSK:  Normal tone and bulk, no lower extremity edema Neuro:  Grossly intact    Data Reviewed: I have personally reviewed following labs and imaging studies  CBC:  Recent Labs Lab 05/09/16 0755 05/09/16 1930  WBC 4.4 5.0  NEUTROABS 2.8 2.6  HGB 12.2* 13.0  HCT 35.8* 37.4*  MCV 98.6* 99.2  PLT 197 0000000   Basic Metabolic Panel:  Recent Labs Lab 05/09/16 0755 05/09/16 1930  NA 135* 135  K 4.0 3.8  CL  --  101  CO2 20* 25  GLUCOSE 207* 144*  BUN 27.7* 33*  CREATININE 1.3 1.47*  CALCIUM 9.3 9.1   GFR: Estimated Creatinine Clearance: 63.5 mL/min (by C-G formula based on SCr of 1.47 mg/dL  (H)). Liver Function Tests:  Recent Labs Lab 05/09/16 0755  AST 29  ALT 30  ALKPHOS 179*  BILITOT 0.34  PROT 8.0  ALBUMIN 3.6   No results for input(s): LIPASE, AMYLASE in the last 168 hours. No results for input(s): AMMONIA in the last 168 hours. Coagulation Profile:  Recent Labs Lab 05/09/16 1930  INR 0.98   Cardiac Enzymes:  Recent Labs Lab 05/09/16 1930  TROPONINI <0.03   BNP (last 3 results) No results for input(s): PROBNP in the last 8760 hours. HbA1C: No results for input(s): HGBA1C in the last 72 hours. CBG:  Recent Labs Lab 05/09/16 1924 05/10/16 0758 05/10/16 1137  GLUCAP 128* 197* 140*   Lipid Profile: No results for input(s): CHOL, HDL, LDLCALC, TRIG, CHOLHDL, LDLDIRECT in the last 72 hours. Thyroid Function Tests: No results for input(s): TSH, T4TOTAL, FREET4, T3FREE, THYROIDAB in the last 72 hours. Anemia Panel: No results for input(s): VITAMINB12, FOLATE, FERRITIN, TIBC, IRON, RETICCTPCT in the last 72 hours. Urine analysis: No results found for: COLORURINE, APPEARANCEUR, LABSPEC, PHURINE, GLUCOSEU, HGBUR, BILIRUBINUR, KETONESUR, PROTEINUR, UROBILINOGEN, NITRITE, LEUKOCYTESUR Sepsis Labs: @LABRCNTIP (procalcitonin:4,lacticidven:4)  )No results found for this or any previous visit (from the past 240 hour(s)).    Radiology Studies: Dg Chest 1 View  Result Date: 05/09/2016 CLINICAL DATA:  Weakness. EXAM: CHEST 1 VIEW COMPARISON:  Chest CT 12/16/2015.  Chest x-ray 06/24/2014. FINDINGS: Ill-defined nodule peripheral right base compatible with the right middle lobe lesions seen on previous CT scan. No other focal airspace consolidation. No pulmonary edema or pleural effusion. The cardiopericardial silhouette is within normal limits for size. Left Port-A-Cath tip overlies the proximal SVC. Bones are diffusely demineralized. Telemetry leads overlie the chest. IMPRESSION: Irregular nodular density peripheral right lung base likely representing the  lesions seen on previous CT scan. Otherwise no acute cardiopulmonary findings. Electronically Signed   By: Misty Stanley M.D.   On: 05/09/2016 20:15   Ct Head Wo Contrast  Result Date: 05/09/2016 CLINICAL DATA:  Acute onset altered mental status earlier today. Concern for stroke. Active chemotherapy for rectal cancer. EXAM: CT HEAD WITHOUT CONTRAST TECHNIQUE: Contiguous axial images were obtained from the base of the skull through the vertex without intravenous contrast. COMPARISON:  None. FINDINGS: Brain: There multiple small foci of abnormal increased density throughout the brain most concerning for metastatic disease. Largest apparent lesion in the right parietal lobe measures 1.4 cm. Many of these appear subcentimeter. Difficult to exclude petechial hemorrhage within the lesions. No significant midline shift. No hydrocephalus. No evidence of large vessel ischemia. Vascular: No hyperdense vessel. Skull: No evidence of osseous metastatic disease. Sinuses/Orbits: No acute finding. Other: None. IMPRESSION: Multiple hyper attenuating foci throughout the brain parenchyma most concerning for metastatic disease. Difficult  to exclude petechial hemorrhage within some of these lesions. Recommend brain MRI with and without contrast for further evaluation. These results were called by telephone at the time of interpretation on 05/09/2016 at 8:05 pm to Dr. Rudene Re , who verbally acknowledged these results. Electronically Signed   By: Jeb Levering M.D.   On: 05/09/2016 20:05   Mr Brain W And Wo Contrast  Result Date: 05/09/2016 CLINICAL DATA:  Speech difficulties.  Difficulty standing. EXAM: MRI HEAD WITHOUT AND WITH CONTRAST TECHNIQUE: Multiplanar, multiecho pulse sequences of the brain and surrounding structures were obtained without and with intravenous contrast. CONTRAST:  55mL MULTIHANCE GADOBENATE DIMEGLUMINE 529 MG/ML IV SOLN COMPARISON:  Head CT 05/09/2016 FINDINGS: Brain: There are innumerable  ring-enhancing foci scattered throughout all areas of the supratentorial and infratentorial brain. Multiple lesion show a degree of diffusion restriction with central low T2 weighted signal. The largest lesion is located in the right parietal lobe measures 1.6 x 1.3 cm. There is no evidence of hemorrhage. No midline shift. No hydrocephalus or extra-axial fluid collection. No age advanced or lobar predominant atrophy. Vascular: Major intracranial arterial and venous sinus flow voids are preserved. No evidence of chronic microhemorrhage or amyloid angiopathy. Skull and upper cervical spine: The visualized skull base, calvarium, upper cervical spine and extracranial soft tissues are normal. Sinuses/Orbits: No fluid levels or advanced mucosal thickening. No mastoid effusion. Normal orbits. IMPRESSION: 1. Innumerable parenchymal contrast-enhancing lesions throughout the brain. The appearance is most consistent with metastatic disease. However, given the patient's HIV status, other considerations include infection, particularly toxoplasmosis, and CNS lymphoma. 2. No evidence of acute hemorrhage. No midline shift or other evidence of herniation. Electronically Signed   By: Ulyses Jarred M.D.   On: 05/09/2016 21:54     Scheduled Meds: . atorvastatin  80 mg Oral QPM  . canagliflozin  100 mg Oral QAC breakfast   And  . linagliptin  5 mg Oral QAC breakfast  . citalopram  20 mg Oral Daily  . efavirenz-emtricitabine-tenofovir  1 tablet Oral QHS  . enoxaparin (LOVENOX) injection  40 mg Subcutaneous QHS  . gabapentin  200 mg Oral TID  . insulin aspart  0-9 Units Subcutaneous TID WC  . levETIRAcetam  500 mg Oral BID   Continuous Infusions:   LOS: 1 day    Time spent: 30 min    Janece Canterbury, MD [facility] Pager [phone]  If 7PM-7AM, please contact night-coverage [email or web address] Password [email or web address] 05/10/2016, 1:07 PM

## 2016-05-10 NOTE — Consult Note (Signed)
[place] Nurse ostomy consult note Stoma type/location: LLQ colostomy (end).  Last seen by me this past Spring (approximately 6 months ago). Mother and sister in room.   Stomal assessment/size: 1 and 1/4 inches round, raised and moist Peristomal assessment: Not seen today.  Patient declines the opportunity for [place] nurse to inspect skin. Treatment options for stomal/peristomal skin: None today.  Patient reports that none are indicated. Output None in pouch.  Patent irrigated colostomy last evening and does not typically have output in-between irrigatoins Ostomy pouching: 2pc.:  1 and 3/4 inch skin barrier with corresponding closed end pouch Education provided: Patient is visited today for support during this admission and for changes that have occurred since our last visit.  He reports that he has had to resign from his job; now is spending time at home "doing nothing".  He seems resigned to this, he reports that he went by his previous workplace recently to say "hello" to his coworkers who he "misses like family." I do not have any supplies in his size today; sister says that she will be happy to go by and pick up supplies from patient's home as needed.  The alternative is for me to assist him to change into a 2 and 1/4 inch system, he declines this today. Enrolled patient in [facility] program: No WOC nursing team will not follow closely, but will remain available to this patient, the nursing and medical teams.  Please re-consult if needed. Thanks, Maudie Flakes, MSN, RN, [place], Arther Abbott  Pager# [phone]

## 2016-05-11 DIAGNOSIS — C7989 Secondary malignant neoplasm of other specified sites: Secondary | ICD-10-CM

## 2016-05-11 DIAGNOSIS — Z803 Family history of malignant neoplasm of breast: Secondary | ICD-10-CM

## 2016-05-11 DIAGNOSIS — B20 Human immunodeficiency virus [HIV] disease: Secondary | ICD-10-CM

## 2016-05-11 DIAGNOSIS — Z833 Family history of diabetes mellitus: Secondary | ICD-10-CM

## 2016-05-11 DIAGNOSIS — Z882 Allergy status to sulfonamides status: Secondary | ICD-10-CM

## 2016-05-11 DIAGNOSIS — Z888 Allergy status to other drugs, medicaments and biological substances status: Secondary | ICD-10-CM

## 2016-05-11 DIAGNOSIS — R569 Unspecified convulsions: Secondary | ICD-10-CM

## 2016-05-11 DIAGNOSIS — C20 Malignant neoplasm of rectum: Secondary | ICD-10-CM

## 2016-05-11 DIAGNOSIS — Z8049 Family history of malignant neoplasm of other genital organs: Secondary | ICD-10-CM

## 2016-05-11 DIAGNOSIS — G939 Disorder of brain, unspecified: Secondary | ICD-10-CM

## 2016-05-11 DIAGNOSIS — Z8249 Family history of ischemic heart disease and other diseases of the circulatory system: Secondary | ICD-10-CM

## 2016-05-11 DIAGNOSIS — C7951 Secondary malignant neoplasm of bone: Secondary | ICD-10-CM

## 2016-05-11 LAB — GLUCOSE, CAPILLARY
GLUCOSE-CAPILLARY: 228 mg/dL — AB (ref 65–99)
GLUCOSE-CAPILLARY: 252 mg/dL — AB (ref 65–99)
Glucose-Capillary: 143 mg/dL — ABNORMAL HIGH (ref 65–99)

## 2016-05-11 LAB — INFECT DISEASE AB IGM REFLEX 1

## 2016-05-11 LAB — HIV-1 RNA QUANT-NO REFLEX-BLD
HIV 1 RNA Quant: <20 copies/mL
LOG10 HIV-1 RNA: UNDETERMINED {Log_copies}/mL

## 2016-05-11 LAB — TOXOPLASMA GONDII ANTIBODY, IGM: Toxoplasma Antibody- IgM: <3 AU/mL (ref 0.0–7.9)

## 2016-05-11 MED ORDER — LEVETIRACETAM 500 MG PO TABS: 500.0000 mg | ORAL_TABLET | Freq: Two times a day (BID) | ORAL | 0 refills | Status: AC

## 2016-05-11 NOTE — Discharge Summary (Signed)
Physician Discharge Summary  Julian Palmer [id number] DOB: [birth year] DOA: 05/09/2016  PCP: Leonel Ramsay, MD  Admit date: 05/09/2016 Discharge date: 05/11/2016  Admitted From: Home  Disposition:  Home  Recommendations for Outpatient Follow-up:  1. Follow up with Dr. Sondra Come on Monday  2. Dr. Tommy Medal, ID, to follow-up toxoplasmosis IgG and schedule lumbar puncture if positive 3. Defer initiation of brain radiation and dexamethasone until toxoplasmosis ruled out by IgG level 4. Started Keppra 500 mg by mouth twice a day  Home Health:  None  Equipment/Devices:  None   Discharge Condition:  Stable, improved CODE STATUS:  Full code  Diet recommendation:  Diabetic   Brief/Interim Summary:  Julian Palmer a 55 y.o.malewith medical history significant of history of metastatic rectal cancer that responded to his last cycle of chemotherapy, followed by Dr. Burr Medico, Type 2 DM, CAD s/p stent, and HIV (normal CD4 count in 600-700 range and undetectable VL) who presented with difficulty finding words that lasted for 20 minutes.  He denied associated numbness, tingling, or weakness.  CT head showed multiple hyperattenuating foci throughout the brain parenchyma most concerning for metastatic disease.  MRI confirmed ring-enhancing lesions c/w metastatic cancer, however, due to his history of HIV, the possibility of toxoplasmosis was raised.   the patient's transient aphasia was likely secondary to a partial seizure induced by 11 or more of his temporal lobe lesions. He was started on Keppra and will need to follow-up with neurology as an outpatient or return to the hospital if he has any further symptoms. Infectious disease was consulted because of the possibility of toxoplasmosis. The patient CD4 counts have been consistently in the 600-700 range which is well above the threshold for which toxoplasmosis would be considered however over the last 8 months, chemotherapy has caused a dramatic drop  in the CD4 counts. Over the summer, his CD4 count was 200 and during his hospitalization his CD4 count was close to 100. For this reason, toxoplasmosis IgM and IgG were sent to the lab. The IgM was negative but IgG is pending at the time of discharge. If the IgG is positive, toxoplasmosis would be higher in the differential and the patient would need to undergo a lumbar puncture to confirm the diagnosis. Alternatively, if the IgG is negative, this is likely not toxoplasmosis and treatment with steroids and brain radiation should be pursued. If the lesions dramatically worsen in a fashion more consistent with metastatic cancer and the diagnosis is more clear, again initiation of steroids and brain radiation without a lumbar puncture will be appropriate.  Dr. Tommy Medal will communicate with the patient after the results of the IgG return next week.  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 Tommy Medal, we will follow-up the results of the pending blood tests -   patient was seen by Oncology, Dr. Burr Medico, who assisted with coordinating  care with radiation oncology.  Tumor Board meeting on Monday to discuss treatment plan.   HIV, viral load pending and CD4 count 110 -  Continued ART  AKI, eating and drinking well -  Repeat BMP at next visit  CAD, stable, continue statin and aspirin  T2DM, resumed home  medications  Discharge Instructions  Discharge Instructions    Call MD for:  difficulty breathing, headache or visual disturbances    Complete by:  As directed    Call MD for:  extreme fatigue    Complete by:  As directed    Call MD for:  hives    Complete by:  As directed    Call MD for:  persistant dizziness or light-headedness    Complete by:  As directed    Call MD for:  persistant nausea and vomiting    Complete by:  As directed    Call MD for:  severe uncontrolled pain    Complete by:  As directed    Call MD for:  temperature >100.4    Complete by:  As directed    Diet Carb Modified    Complete by:  As directed    Increase activity slowly    Complete by:  As directed        Medication List    STOP taking these medications   gabapentin 100 MG capsule Commonly known as:  NEURONTIN   HYDROcodone-acetaminophen 5-325 MG tablet Commonly known as:  NORCO/VICODIN   losartan 100 MG tablet Commonly known as:  COZAAR   ondansetron 8 MG tablet Commonly known as:  ZOFRAN   oxyCODONE-acetaminophen 5-325 MG tablet Commonly known as:  PERCOCET/ROXICET   prochlorperazine 10 MG tablet Commonly known as:  COMPAZINE     TAKE these medications   aspirin EC 81 MG tablet Take 81 mg by mouth at bedtime.   atorvastatin 80 MG tablet Commonly known as:  LIPITOR Take 1 tablet (80 mg total) by mouth every evening.   citalopram 40 MG tablet Commonly known as:  CELEXA Take 40 mg by mouth daily with breakfast.   efavirenz-emtricitabine-tenofovir 600-200-300 MG tablet Commonly known as:  ATRIPLA Take 1 tablet by mouth at bedtime.   glimepiride 4 MG tablet Commonly known as:  AMARYL Take 2 mg by mouth daily with breakfast.   GLYXAMBI 25-5 MG Tabs Generic drug:  Empagliflozin-Linagliptin Take 1 tablet by mouth daily with breakfast.   levETIRAcetam 500 MG tablet Commonly known as:  KEPPRA Take 1 tablet (500 mg total) by mouth 2 (two) times daily.   metFORMIN 1000 MG  tablet Commonly known as:  GLUCOPHAGE Take 1,000 mg by mouth 2 (two) times daily.   multivitamin with minerals Tabs tablet Take 1 tablet by mouth daily.   PROBIOTIC DAILY Caps Take 1 tablet by mouth daily.      Follow-up Information    Truitt Merle, MD. Schedule an appointment as soon as possible for a visit in 1 week(s).   Specialties:  Hematology, Oncology Contact information: [place] [address] [place], [place]. Schedule an appointment as soon as possible for a visit in 1 week(s).   Specialty:  Infectious Diseases Why:  regarding toxoplasmosis test Contact information: [address] [phone]          Allergies  Allergen Reactions  . Ace Inhibitors Other (See Comments)    Other reaction(s): Unknown  . Sulfa Antibiotics Hives  Consultations: Oncology, Dr. Burr Medico Infectious Disease, Dr. Tommy Medal   Procedures/Studies: Dg Chest 1 View  Result Date: 05/09/2016 CLINICAL DATA:  Weakness. EXAM: CHEST 1 VIEW COMPARISON:  Chest CT 12/16/2015.  Chest x-ray 06/24/2014. FINDINGS: Ill-defined nodule peripheral right base compatible with the right middle lobe lesions seen on previous CT scan. No other focal airspace consolidation. No pulmonary edema or pleural effusion. The cardiopericardial silhouette is within normal limits for size. Left Port-A-Cath tip overlies the proximal SVC. Bones are diffusely demineralized. Telemetry leads overlie the chest. IMPRESSION: Irregular nodular density peripheral right lung base likely representing the lesions seen on previous CT scan. Otherwise no acute cardiopulmonary findings. Electronically Signed   By: Misty Stanley M.D.   On: 05/09/2016 20:15   Ct Head Wo Contrast  Result Date: 05/09/2016 CLINICAL DATA:  Acute onset altered mental status earlier today. Concern for stroke. Active chemotherapy for rectal cancer. EXAM: CT HEAD WITHOUT CONTRAST TECHNIQUE: Contiguous  axial images were obtained from the base of the skull through the vertex without intravenous contrast. COMPARISON:  None. FINDINGS: Brain: There multiple small foci of abnormal increased density throughout the brain most concerning for metastatic disease. Largest apparent lesion in the right parietal lobe measures 1.4 cm. Many of these appear subcentimeter. Difficult to exclude petechial hemorrhage within the lesions. No significant midline shift. No hydrocephalus. No evidence of large vessel ischemia. Vascular: No hyperdense vessel. Skull: No evidence of osseous metastatic disease. Sinuses/Orbits: No acute finding. Other: None. IMPRESSION: Multiple hyper attenuating foci throughout the brain parenchyma most concerning for metastatic disease. Difficult to exclude petechial hemorrhage within some of these lesions. Recommend brain MRI with and without contrast for further evaluation. These results were called by telephone at the time of interpretation on 05/09/2016 at 8:05 pm to Dr. Rudene Re , who verbally acknowledged these results. Electronically Signed   By: Jeb Levering M.D.   On: 05/09/2016 20:05   Mr Brain W And Wo Contrast  Result Date: 05/09/2016 CLINICAL DATA:  Speech difficulties.  Difficulty standing. EXAM: MRI HEAD WITHOUT AND WITH CONTRAST TECHNIQUE: Multiplanar, multiecho pulse sequences of the brain and surrounding structures were obtained without and with intravenous contrast. CONTRAST:  47mL MULTIHANCE GADOBENATE DIMEGLUMINE 529 MG/ML IV SOLN COMPARISON:  Head CT 05/09/2016 FINDINGS: Brain: There are innumerable ring-enhancing foci scattered throughout all areas of the supratentorial and infratentorial brain. Multiple lesion show a degree of diffusion restriction with central low T2 weighted signal. The largest lesion is located in the right parietal lobe measures 1.6 x 1.3 cm. There is no evidence of hemorrhage. No midline shift. No hydrocephalus or extra-axial fluid collection. No  age advanced or lobar predominant atrophy. Vascular: Major intracranial arterial and venous sinus flow voids are preserved. No evidence of chronic microhemorrhage or amyloid angiopathy. Skull and upper cervical spine: The visualized skull base, calvarium, upper cervical spine and extracranial soft tissues are normal. Sinuses/Orbits: No fluid levels or advanced mucosal thickening. No mastoid effusion. Normal orbits. IMPRESSION: 1. Innumerable parenchymal contrast-enhancing lesions throughout the brain. The appearance is most consistent with metastatic disease. However, given the patient's HIV status, other considerations include infection, particularly toxoplasmosis, and CNS lymphoma. 2. No evidence of acute hemorrhage. No midline shift or other evidence of herniation. Electronically Signed   By: Ulyses Jarred M.D.   On: 05/09/2016 21:54    Subjective: Feeling back to baseline.  Denies recurrent episodes of speech difficulty.  Denies focal numbness or weakness.  Would like to be discharged soon.    Discharge Exam:  Vitals:   05/11/16 0515 05/11/16 1537  BP: 112/88 (!) 125/92  Pulse: 68 79  Resp: 18 18  Temp: 97.7 F (36.5 C) 97.7 F (36.5 C)   Vitals:   05/10/16 1430 05/10/16 2118 05/11/16 0515 05/11/16 1537  BP: 136/88 108/62 112/88 (!) 125/92  Pulse: 78 81 68 79  Resp: 18 18 18 18   Temp: 98 F (36.7 C) 98.2 F (36.8 C) 97.7 F (36.5 C) 97.7 F (36.5 C)  TempSrc: Oral Oral Oral Oral  SpO2: 96% 93% 95% 94%    General exam:  Adult male.  No acute distress.  HEENT:  NCAT, MMM Respiratory system: Clear to auscultation bilaterally Cardiovascular system: Regular rate and rhythm, normal S1/S2. No murmurs, rubs, gallops or clicks.  Warm extremities Gastrointestinal system: Normal active bowel sounds, soft, nondistended, nontender.  Ostomy with soft stool in the left lower quadrant MSK:  Normal tone and bulk, no lower extremity edema Neuro:  Grossly intact, CN II - XII grossly intact,  strength 5/5 Throughout, sensation intact to light touch, no dysmetria    The results of significant diagnostics from this hospitalization (including imaging, microbiology, ancillary and laboratory) are listed below for reference.     Microbiology: No results found for this or any previous visit (from the past 240 hour(s)).   Labs: BNP (last 3 results) No results for input(s): BNP in the last 8760 hours. Basic Metabolic Panel:  Recent Labs Lab 05/09/16 0755 05/09/16 1930  NA 135* 135  K 4.0 3.8  CL  --  101  CO2 20* 25  GLUCOSE 207* 144*  BUN 27.7* 33*  CREATININE 1.3 1.47*  CALCIUM 9.3 9.1   Liver Function Tests:  Recent Labs Lab 05/09/16 0755  AST 29  ALT 30  ALKPHOS 179*  BILITOT 0.34  PROT 8.0  ALBUMIN 3.6   No results for input(s): LIPASE, AMYLASE in the last 168 hours. No results for input(s): AMMONIA in the last 168 hours. CBC:  Recent Labs Lab 05/09/16 0755 05/09/16 1930  WBC 4.4 5.0  NEUTROABS 2.8 2.6  HGB 12.2* 13.0  HCT 35.8* 37.4*  MCV 98.6* 99.2  PLT 197 246   Cardiac Enzymes:  Recent Labs Lab 05/09/16 1930  TROPONINI <0.03   BNP: Invalid input(s): POCBNP CBG:  Recent Labs Lab 05/10/16 1731 05/10/16 2151 05/11/16 0745 05/11/16 1159 05/11/16 1659  GLUCAP 194* 146* 228* 143* 252*   D-Dimer No results for input(s): DDIMER in the last 72 hours. Hgb A1c No results for input(s): HGBA1C in the last 72 hours. Lipid Profile No results for input(s): CHOL, HDL, LDLCALC, TRIG, CHOLHDL, LDLDIRECT in the last 72 hours. Thyroid function studies No results for input(s): TSH, T4TOTAL, T3FREE, THYROIDAB in the last 72 hours.  Invalid input(s): FREET3 Anemia work up No results for input(s): VITAMINB12, FOLATE, FERRITIN, TIBC, IRON, RETICCTPCT in the last 72 hours. Urinalysis No results found for: COLORURINE, APPEARANCEUR, [place], [place], GLUCOSEU, HGBUR, BILIRUBINUR, KETONESUR, PROTEINUR, UROBILINOGEN, NITRITE, LEUKOCYTESUR Sepsis  Labs Invalid input(s): PROCALCITONIN,  WBC,  LACTICIDVEN   Time coordinating discharge: Over 30 minutes  SIGNED:   Janece Canterbury, MD  [facility] 05/11/2016, 5:27 PM Pager   If 7PM-7AM, please contact night-coverage [email or web address] Password [email or web address]

## 2016-05-11 NOTE — Progress Notes (Signed)
Location/Histology of Brain Tumor:  MRI of the brain from 05/09/16 showed "Innumerable parenchymal contrast-enhancing lesions throughout the brain."   Patient presented with symptoms of:  sudden onset aphasia, and slight confusion which lasted about 20 minutes.  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  Visual deficits/changes: no  Confusion/Memory deficits: no  Painful bone metastases at present, if any: no  SAFETY ISSUES:  Prior radiation? Yes 8/12 - definitive dose RT 45 Gy over 5 weeks with 16Gy boosting, 09/01/15-09/22/15  right sacrum 35 Gy  Pacemaker/ICD? no  Possible current pregnancy? no  Is the patient on methotrexate? no  Additional Complaints / other details: Patient started taking Keppra in the hospital.  BP (!) 133/98 (BP Location: Right Arm, Patient Position: Sitting)   Pulse 91   Temp 98.2 F (36.8 C) (Oral)   Ht 5\' 10"  (1.778 m)   Wt 194 lb (88 kg)   SpO2 98%   BMI 27.84 kg/m    Wt Readings from Last 3 Encounters:  05/14/16 194 lb (88 kg)  05/09/16 199 lb 3.2 oz (90.4 kg)  05/09/16 199 lb 9.6 oz (90.5 kg)

## 2016-05-11 NOTE — Consult Note (Signed)
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 Sheran Fava   HPI:  Julian Palmer is an 55 y.o. male with history of HIV that has been very well controlled on Atripla initially treated by Dr. Clayborn Bigness and subsequent treated by Dr. Ola Spurr in [place]. The patient unfortunately developed metastatic rectal cancer with involvement of multiple sites including his pelvis. His CD4 count was 600 beforehand her with chemotherapy and since then has dropped to 200 in June and now 110 during this admission.  He was admitted to the hospital on the 15th of this month with aphasia. MRI of the brain was done which showed multiple lesions throughout the brain consistent with metastases from his known cancer but also potentially consistent with toxoplasmosis versus CNS lymphoma.  We're consulted to address the possibility that this patient is suffering from CNS toxoplasmosis.     Past Medical History:  Diagnosis Date  . Anxiety   . At risk for sleep apnea    STOP-BANG= 4     SENT TO PCP 03-23-2014  . Coronary artery disease CARDIOLOGIST--  DR Rockey Situ --  LAST LOV 04-17-2013   S/P  PCI to mid and distal LAD and BM stent x1 to pLAD  . Depression   . ED (erectile dysfunction)   . History of carcinoma in situ of anal canal    2010  . History of rectal cancer    04/ 2012--  invasive squamous cell carcinoma at 9 o'clock position, poorly differentiated s/p resection and  30 cylces radiation / chemo therapy for 2 weeks  . HIV positive ([place])   . Hyperlipidemia   . Hypertension   . PONV (postoperative nausea and vomiting)   . Primary cancer of anal canal ([place]) 05/18/2011  . Radiation 01/2011   definitive dose RT 45 Gy over 5 weeks with 16Gy boosting  . Radiation 09/01/15-09/22/15   right sacrum 35 Gy  . 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 S/p  bare metal coronary artery stent    1998  pLAD  . Sigmoid diverticulosis   . Type 2 diabetes mellitus ([place])   . Wears glasses     Past Surgical History:  Procedure Laterality Date  . CARDIOVASCULAR STRESS TEST  04/ 2010   low risk study/  very small region of mildly reduced perfersion is mildly reversible dLAD territory  . COLONOSCOPY WITH PROPOFOL  05-28-2008  . CORONARY ANGIOPLASTY WITH STENT PLACEMENT  1998   [facility]   BM stent to pLAD and PCI to Mid and Distal LAD/  no other significant cad  . EUA W/ BX PERIANAL SKIN  06-08-2009  . EXCISION ANAL POLYP  2009  . INGUINAL HERNIA REPAIR Bilateral 1988  . LAPAROSCOPIC ASSISTED ABDOMINAL PERINEAL RESECTION N/A 04/22/2014   Procedure: LAPAROSCOPIC  ABDOMINAL PERINEAL RESECTION, ;  Surgeon: Leighton Ruff, MD;  Location: [facility];  Service: General;  Laterality: N/A;  . MASS EXCISION N/A 03/26/2014   Procedure: ANAL EXAM UNDER ANESTHESIA,EXCISIONAL BIOPSY OF ANAL MASS;  Surgeon: Leighton Ruff, MD;  Location: [facility];  Service: General;  Laterality: N/A;  . MUSCLE FLAP CLOSURE N/A 04/22/2014   Procedure: VRAM FLAP;  Surgeon: Irene Limbo, MD;  Location: [facility];  Service: Plastics;  Laterality: N/A;  . PORT-A-CATH PLACEMENT  02-21-2011   REMOVED  2013  . PORTACATH  PLACEMENT Left 06/24/2014   Procedure: INSERTION PORT-A-CATH LEFT SUBCLAVIAN;  Surgeon: Leighton Ruff, MD;  Location: [facility];  Service: General;  Laterality: Left;  . RE-EXCISION ANAL SQUAMOUS CELL CARCINOMA  2010  . REMOVAL RECTAL MASS  01-08-2014  . RESECTION ANAL MASS  02-05-2011  . SEPTOPLASTY  1985  . SPHINCTEROTOMY  2009  . URETHROTOMY Bilateral 04/22/2014   Procedure: CYSTOSCOPY/URETHROTOMY;  Surgeon: Leighton Ruff, MD;  Location: [facility];  Service: General;  Laterality: Bilateral;    Social History:  reports that he has never smoked. He has never used smokeless tobacco. He reports that he does not drink alcohol or use drugs.   Family History  Problem  Relation Age of Onset  . Diabetes type II Mother   . Breast cancer Mother     survivor  . Hypertension Mother   . Coronary artery disease Father   . Uterine cancer Paternal Grandmother     Allergies  Allergen Reactions  . Ace Inhibitors Other (See Comments)    Other reaction(s): Unknown  . Sulfa Antibiotics Hives     Medications: I have reviewed patients current medications as documented in Epic Anti-infectives    Start     Dose/Rate Route Frequency Ordered Stop   05/09/16 2345  efavirenz-emtricitabine-tenofovir (ATRIPLA) 600-200-300 MG per tablet 1 tablet     1 tablet Oral Daily at bedtime 05/09/16 2344           ROS:  as in HPI otherwise remainder of 12 point Review of Systems is negative   Blood pressure (!) 125/92, pulse 79, temperature 97.7 F (36.5 C), temperature source Oral, resp. rate 18, SpO2 94 %. General: Alert and awake, oriented x3, not in any acute distress. HEENT: anicteric sclera,  EOMI, oropharynx clear and without exudate Cardiovascular: regular rate, normal r,  no murmur rubs or gallops Pulmonary: clear to auscultation bilaterally, no wheezing, rales or rhonchi Gastrointestinal: soft nontender, nondistended, normal bowel sounds, Musculoskeletal: no  clubbing or edema noted bilaterally Skin, soft tissue: no rashes Neuro: nonfocal, strength and sensation intact   Results for orders placed or performed during the hospital encounter of 05/09/16 (from the past 48 hour(s))  T-helper cells (CD4) count (not at [facility])     Status: Abnormal   Collection Time: 05/10/16 12:02 AM  Result Value Ref Range   CD4 T Cell Abs 110 (L) 400 - 2,700 /uL   CD4 % Helper T Cell 22 (L) 33 - 55 %  Toxoplasma gondii antibody, IgM     Status: None   Collection Time: 05/10/16 12:02 AM  Result Value Ref Range   Toxoplasma Antibody- IgM <3.0 0.0 - 7.9 AU/mL    Comment: (NOTE)                             Negative            <8.0                             Equivocal      8.0 -  9.9                             Positive            >9.9 Performed At: [facility] [place] [place], [place] [id number] Lindon Romp MD [id number]   Infect disease  Ab IgM reflex 1     Status: None   Collection Time: 05/10/16 12:02 AM  Result Value Ref Range   Infect disease Ab comment 1 Comment     Comment: (NOTE) It is presumed the patient has not been infected with and is not undergoing an acute infection with Toxoplasma. If symptoms persist, submit a new specimen after three weeks. Performed At: [facility] [place], [place] [id number] Lindon Romp MD [id number]   Glucose, capillary     Status: Abnormal   Collection Time: 05/10/16  7:58 AM  Result Value Ref Range   Glucose-Capillary 197 (H) 65 - 99 mg/dL   Comment 1 Notify RN    Comment 2 Document in Chart   Glucose, capillary     Status: Abnormal   Collection Time: 05/10/16 11:37 AM  Result Value Ref Range   Glucose-Capillary 140 (H) 65 - 99 mg/dL   Comment 1 Notify RN    Comment 2 Document in Chart   Glucose, capillary     Status: Abnormal   Collection Time: 05/10/16  5:31 PM  Result Value Ref Range   Glucose-Capillary 194 (H) 65 - 99 mg/dL   Comment 1 Notify RN    Comment 2 Document in Chart   Glucose, capillary     Status: Abnormal   Collection Time: 05/10/16  9:51 PM  Result Value Ref Range   Glucose-Capillary 146 (H) 65 - 99 mg/dL  Glucose, capillary     Status: Abnormal   Collection Time: 05/11/16  7:45 AM  Result Value Ref Range   Glucose-Capillary 228 (H) 65 - 99 mg/dL  Glucose, capillary     Status: Abnormal   Collection Time: 05/11/16 11:59 AM  Result Value Ref Range   Glucose-Capillary 143 (H) 65 - 99 mg/dL  Glucose, capillary     Status: Abnormal   Collection Time: 05/11/16  4:59 PM  Result Value Ref Range   Glucose-Capillary 252 (H) 65 - 99 mg/dL   @BRIEFLABTABLE (sdes,specrequest,cult,reptstatus)   )No results found for this or any previous  visit (from the past 720 hour(s)).   Impression/Recommendation  Principal Problem:   Aphasia Active Problems:   Primary cancer of anal canal (HCC)   HIV (human immunodeficiency virus infection) ([place])   Type 2 diabetes mellitus ([place])   Brain lesion   Partial seizure (HCC)   Julian Palmer is a 55 y.o. male with  HIV, Diabetes metastatic rectal cancer now with likely metastases to the brain versus toxoplasmosis.  #1 Possible toxoplasmosis:  I think that the patient CNS lesions are much more likely to be due to metastatic rectal cancer which we know he already has.  He has been a highly adherent patient to his antiretroviral regimen of Atripla and has typically maintained an undetectable viral load. His CD4 count now has dropped due to chemotherapy and is a 110.  While toxoplasmosis becomes a risk CD4 count of less than 200 the wrist tends to accelerate below 100 and especially below a CD4 count of 50.  He had a Toxoplasma IgM done which was negative but this test is very insensitive.  I've ordered a Toxoplasma IgG.  If the Toxoplasma IgG is negative then we can definitively take CNS toxoplasmosis off the table of possibilities.  If this test is positive it does not prove that he has CNS toxoplasmosis and I still maintain that central nervous system metastatic disease is the most likely diagnosis.  If however we see greater clarity  if his Toxoplasma IgG would be positive then we could pursue a lumbar puncture with CSF analysis and CSF sent for Toxoplasma by PCR along with CSF white blood cell would differential, protein and glucose, and a cryptococcal antigen  For the moment we are waiting for his Toxoplasma IgG to come back and he is stable to for discharge.  Will follow-up the results of this test and if it is positive we will revisit whether the patient once to pursue a lumbar puncture or not a greater certainty with regards to other he might have CNS toxoplasmosis.  I do not  know from oncological standpoint much time he can afford to wait for all of these different test to be done but would defer to oncology to render an opinion about timing of initiation of radiation and corticosteroids.  Only should he actually have CNS toxoplasmosis and be started on corticosteroids with radiation the CNS lesions would worsen. It is possible that he could present with CNS toxoplasmosis encephalitis or worsened focal neurological deficits due to expansion of these lesions.  Once again though I think the diagnosis of toxoplasmosis is unlikely and I also think that even if it were the true diagnosis that if we follow-up his clinical condition closely and repeat MRI this will give [place] a great deal of information.  I reviewed this case also with 2 of my colleagues Dr. Linus Salmons and Dr. Johnnye Sima both of whom agreed that they thought CNS toxoplasmosis was very unlikely.  #2 HIV and AIDS: Continue Atripla, though one could consider a more bone friendly regimen with less CNS axis of the i.e. risk of depression. Would defer this to his primary infectious disease doctor though Dr. Ola Spurr.  Once we have resolved the question of toxoplasmosis I would place the patient on PCP prophylaxis with dapsone. Bactrim would be more ideal as it would be more ideal drug to provide both prophylaxis for PCP and toxoplasmosis but he is allergic to sulfa antibiotics.  I spent greater than 80 minutes with the patient including greater than 50% of time in face to face counsel of the patient or his HIV and AIDS his antiviral regimen his CNS lesions his rectal cancer with metastases and the possibility of toxoplasmosis and in coordination of his care.  Dr. Linus Salmons is available for questions over the weekend should the patient still be in the hospital  05/11/2016, 5:02 PM   Thank you so much for this interesting consult  [place] for [place] [phone] (pager) [phone]  (office) 05/11/2016, 5:02 PM  [place] 05/11/2016, 5:02 PM

## 2016-05-11 NOTE — Progress Notes (Signed)
PROGRESS NOTE  Julian Palmer  [id number] DOB: 1961-03-14 DOA: 05/09/2016 PCP: Leonel Ramsay, MD  Brief Narrative:   Julian Palmer is a 55 y.o. male with medical history significant of history of metastatic rectal cancer that responded to his last cycle of chemotherapy, followed by Dr. Burr Medico, Type 2 DM, CAD s/p stent, and HIV (normal CD4 count in 600-700 range and undetectable VL) who presents to ED at [facility] with difficulty finding words that lasted for 20 minutes.  He denied associated numbness, tingling, or weakness.  CT head showed Multiple hyper attenuating foci throughout the brain parenchyma most concerning for metastatic disease.  MRI confirmed ring-enhancing lesions c/w metastatic cancer.  Case was discussed with Neurology who recommended starting Keppra (no need for EEG) for probable partial seizure induced by some of the temporal lobe lesions seen on MRI.  Case was also discussed with infectious disease who did not feel that the patient was high risk for toxoplasmosis if his CD4 counts have been consistently normal.  Oncology is consulting and radiation oncology has been consulted by Oncology.  Awaiting initiation of dexamethasone pending results of CD4.    Assessment & Plan:   Principal Problem:   Aphasia Active Problems:   Primary cancer of anal canal (HCC)   HIV (human immunodeficiency virus infection) ([place])   Type 2 diabetes mellitus ([place])   Brain lesion   Partial seizure ([place])  Transient aphasia, possibly related to seizure -  Started keppra 11/16 -  No further work up for TIA  Multiple brain lesions, likely metastatic rectal cancer, however, CD4 count is low due to his recent chemotherapy so possible toxoplasmosis, although less likely -  Toxoplasmosis IgM negative -  Toxoplasmosis IgG pending -  ID consulted, Dr. Tommy Medal to see today -  XRT ONC and Oncology assistance appreciated.  Please hold off on treatment pending ID recommendations regarding  toxoplasmosis.  HIV, awaiting viral load and CD4 count 110 -  Continue ART  AKI, eating and drinking well -  Repeat BMP in AM  CAD, stable, continue statin.  Unclear why not on aspirin  T2DM, holding metformin -  Continue SSI  DVT prophylaxis:  lovenox Code Status:  full Family Communication:  Patient alone.  He does not wants his medical information to be disclose.  Disposition Plan:  Awaiting ID consultation.  If plan is to wait for toxo IgG, consider outpatient follow up as test may not return until next week.    Consultants:   Oncology, Dr. Burr Medico  Procedures:  none  Antimicrobials:   none    Subjective: Feeling back to baseline.  Denies recurrent episodes of speech difficulty.  Denies focal numbness or weakness.  Would like to be discharged soon.    Objective: Vitals:   05/10/16 0524 05/10/16 1430 05/10/16 2118 05/11/16 0515  BP: 113/71 136/88 108/62 112/88  Pulse: 82 78 81 68  Resp: 16 18 18 18   Temp: 98.1 F (36.7 C) 98 F (36.7 C) 98.2 F (36.8 C) 97.7 F (36.5 C)  TempSrc: Oral Oral Oral Oral  SpO2: 94% 96% 93% 95%    Intake/Output Summary (Last 24 hours) at 05/11/16 1345 Last data filed at 05/10/16 1800  Gross per 24 hour  Intake              210 ml  Output              600 ml  Net             -  390 ml   There were no vitals filed for this visit.  Examination:  General exam:  Adult male.  No acute distress.  HEENT:  NCAT, MMM Respiratory system: Clear to auscultation bilaterally Cardiovascular system: Regular rate and rhythm, normal S1/S2. No murmurs, rubs, gallops or clicks.  Warm extremities Gastrointestinal system: Normal active bowel sounds, soft, nondistended, nontender.  Ostomy with soft stool in the left lower quadrant MSK:  Normal tone and bulk, no lower extremity edema Neuro:  Grossly intact, CN II - XII grossly intact, strength 5/5 Throughout, sensation intact to light touch, no dysmetria    Data Reviewed: I have personally  reviewed following labs and imaging studies  CBC:  Recent Labs Lab 05/09/16 0755 05/09/16 1930  WBC 4.4 5.0  NEUTROABS 2.8 2.6  HGB 12.2* 13.0  HCT 35.8* 37.4*  MCV 98.6* 99.2  PLT 197 0000000   Basic Metabolic Panel:  Recent Labs Lab 05/09/16 0755 05/09/16 1930  NA 135* 135  K 4.0 3.8  CL  --  101  CO2 20* 25  GLUCOSE 207* 144*  BUN 27.7* 33*  CREATININE 1.3 1.47*  CALCIUM 9.3 9.1   GFR: Estimated Creatinine Clearance: 63.5 mL/min (by C-G formula based on SCr of 1.47 mg/dL (H)). Liver Function Tests:  Recent Labs Lab 05/09/16 0755  AST 29  ALT 30  ALKPHOS 179*  BILITOT 0.34  PROT 8.0  ALBUMIN 3.6   No results for input(s): LIPASE, AMYLASE in the last 168 hours. No results for input(s): AMMONIA in the last 168 hours. Coagulation Profile:  Recent Labs Lab 05/09/16 1930  INR 0.98   Cardiac Enzymes:  Recent Labs Lab 05/09/16 1930  TROPONINI <0.03   BNP (last 3 results) No results for input(s): PROBNP in the last 8760 hours. HbA1C: No results for input(s): HGBA1C in the last 72 hours. CBG:  Recent Labs Lab 05/10/16 1137 05/10/16 1731 05/10/16 2151 05/11/16 0745 05/11/16 1159  GLUCAP 140* 194* 146* 228* 143*   Lipid Profile: No results for input(s): CHOL, HDL, LDLCALC, TRIG, CHOLHDL, LDLDIRECT in the last 72 hours. Thyroid Function Tests: No results for input(s): TSH, T4TOTAL, FREET4, T3FREE, THYROIDAB in the last 72 hours. Anemia Panel: No results for input(s): VITAMINB12, FOLATE, FERRITIN, TIBC, IRON, RETICCTPCT in the last 72 hours. Urine analysis: No results found for: COLORURINE, APPEARANCEUR, LABSPEC, PHURINE, GLUCOSEU, HGBUR, BILIRUBINUR, KETONESUR, PROTEINUR, UROBILINOGEN, NITRITE, LEUKOCYTESUR Sepsis Labs: @LABRCNTIP (procalcitonin:4,lacticidven:4)  )No results found for this or any previous visit (from the past 240 hour(s)).    Radiology Studies: Dg Chest 1 View  Result Date: 05/09/2016 CLINICAL DATA:  Weakness. EXAM: CHEST  1 VIEW COMPARISON:  Chest CT 12/16/2015.  Chest x-ray 06/24/2014. FINDINGS: Ill-defined nodule peripheral right base compatible with the right middle lobe lesions seen on previous CT scan. No other focal airspace consolidation. No pulmonary edema or pleural effusion. The cardiopericardial silhouette is within normal limits for size. Left Port-A-Cath tip overlies the proximal SVC. Bones are diffusely demineralized. Telemetry leads overlie the chest. IMPRESSION: Irregular nodular density peripheral right lung base likely representing the lesions seen on previous CT scan. Otherwise no acute cardiopulmonary findings. Electronically Signed   By: Misty Stanley M.D.   On: 05/09/2016 20:15   Ct Head Wo Contrast  Result Date: 05/09/2016 CLINICAL DATA:  Acute onset altered mental status earlier today. Concern for stroke. Active chemotherapy for rectal cancer. EXAM: CT HEAD WITHOUT CONTRAST TECHNIQUE: Contiguous axial images were obtained from the base of the skull through the vertex without intravenous contrast. COMPARISON:  None. FINDINGS: Brain: There multiple small foci of abnormal increased density throughout the brain most concerning for metastatic disease. Largest apparent lesion in the right parietal lobe measures 1.4 cm. Many of these appear subcentimeter. Difficult to exclude petechial hemorrhage within the lesions. No significant midline shift. No hydrocephalus. No evidence of large vessel ischemia. Vascular: No hyperdense vessel. Skull: No evidence of osseous metastatic disease. Sinuses/Orbits: No acute finding. Other: None. IMPRESSION: Multiple hyper attenuating foci throughout the brain parenchyma most concerning for metastatic disease. Difficult to exclude petechial hemorrhage within some of these lesions. Recommend brain MRI with and without contrast for further evaluation. These results were called by telephone at the time of interpretation on 05/09/2016 at 8:05 pm to Dr. Rudene Re , who verbally  acknowledged these results. Electronically Signed   By: Jeb Levering M.D.   On: 05/09/2016 20:05   Mr Brain W And Wo Contrast  Result Date: 05/09/2016 CLINICAL DATA:  Speech difficulties.  Difficulty standing. EXAM: MRI HEAD WITHOUT AND WITH CONTRAST TECHNIQUE: Multiplanar, multiecho pulse sequences of the brain and surrounding structures were obtained without and with intravenous contrast. CONTRAST:  74mL MULTIHANCE GADOBENATE DIMEGLUMINE 529 MG/ML IV SOLN COMPARISON:  Head CT 05/09/2016 FINDINGS: Brain: There are innumerable ring-enhancing foci scattered throughout all areas of the supratentorial and infratentorial brain. Multiple lesion show a degree of diffusion restriction with central low T2 weighted signal. The largest lesion is located in the right parietal lobe measures 1.6 x 1.3 cm. There is no evidence of hemorrhage. No midline shift. No hydrocephalus or extra-axial fluid collection. No age advanced or lobar predominant atrophy. Vascular: Major intracranial arterial and venous sinus flow voids are preserved. No evidence of chronic microhemorrhage or amyloid angiopathy. Skull and upper cervical spine: The visualized skull base, calvarium, upper cervical spine and extracranial soft tissues are normal. Sinuses/Orbits: No fluid levels or advanced mucosal thickening. No mastoid effusion. Normal orbits. IMPRESSION: 1. Innumerable parenchymal contrast-enhancing lesions throughout the brain. The appearance is most consistent with metastatic disease. However, given the patient's HIV status, other considerations include infection, particularly toxoplasmosis, and CNS lymphoma. 2. No evidence of acute hemorrhage. No midline shift or other evidence of herniation. Electronically Signed   By: Ulyses Jarred M.D.   On: 05/09/2016 21:54     Scheduled Meds: . atorvastatin  80 mg Oral QPM  . canagliflozin  100 mg Oral QAC breakfast   And  . linagliptin  5 mg Oral QAC breakfast  . citalopram  20 mg Oral  Daily  . efavirenz-emtricitabine-tenofovir  1 tablet Oral QHS  . enoxaparin (LOVENOX) injection  40 mg Subcutaneous QHS  . gabapentin  200 mg Oral TID  . insulin aspart  0-9 Units Subcutaneous TID WC  . levETIRAcetam  500 mg Oral BID   Continuous Infusions:   LOS: 2 days    Time spent: 30 min    Janece Canterbury, MD [facility] Pager [phone]  If 7PM-7AM, please contact night-coverage [email or web address] Password [email or web address] 05/11/2016, 1:45 PM

## 2016-05-12 LAB — TOXOPLASMA ANTIBODIES- IGG AND  IGM
Toxoplasma Antibody- IgM: <3 AU/mL (ref 0.0–7.9)
Toxoplasma IgG Ratio: <3 IU/mL (ref 0.0–7.1)

## 2016-05-13 ENCOUNTER — Telehealth: Payer: Self-pay | Admitting: Internal Medicine

## 2016-05-13 NOTE — Telephone Encounter (Signed)
Notified patient that Toxoplasmosis IgG was negative.  Patient has appointment tomorrow with Dr. Sondra Come to discuss starting steroids and possible radiation.

## 2016-05-14 ENCOUNTER — Ambulatory Visit
Admission: RE | Admit: 2016-05-14 | Discharge: 2016-05-14 | Disposition: A | Discharge status: Home / Self Care | Payer: 59 | Source: Ambulatory Visit | Attending: Radiation Oncology | Admitting: Radiation Oncology

## 2016-05-14 ENCOUNTER — Telehealth: Payer: Self-pay | Admitting: Infectious Disease

## 2016-05-14 ENCOUNTER — Encounter: Payer: Self-pay | Admitting: Radiation Oncology

## 2016-05-14 DIAGNOSIS — F329 Major depressive disorder, single episode, unspecified: Secondary | ICD-10-CM | POA: Insufficient documentation

## 2016-05-14 DIAGNOSIS — N529 Male erectile dysfunction, unspecified: Secondary | ICD-10-CM | POA: Diagnosis not present

## 2016-05-14 DIAGNOSIS — C7931 Secondary malignant neoplasm of brain: Secondary | ICD-10-CM | POA: Diagnosis not present

## 2016-05-14 DIAGNOSIS — Z85048 Personal history of other malignant neoplasm of rectum, rectosigmoid junction, and anus: Secondary | ICD-10-CM | POA: Diagnosis not present

## 2016-05-14 DIAGNOSIS — F419 Anxiety disorder, unspecified: Secondary | ICD-10-CM | POA: Insufficient documentation

## 2016-05-14 DIAGNOSIS — I2510 Atherosclerotic heart disease of native coronary artery without angina pectoris: Secondary | ICD-10-CM | POA: Diagnosis not present

## 2016-05-14 DIAGNOSIS — E119 Type 2 diabetes mellitus without complications: Secondary | ICD-10-CM | POA: Diagnosis not present

## 2016-05-14 DIAGNOSIS — I10 Essential (primary) hypertension: Secondary | ICD-10-CM | POA: Insufficient documentation

## 2016-05-14 DIAGNOSIS — E785 Hyperlipidemia, unspecified: Secondary | ICD-10-CM | POA: Diagnosis not present

## 2016-05-14 DIAGNOSIS — K5730 Diverticulosis of large intestine without perforation or abscess without bleeding: Secondary | ICD-10-CM | POA: Insufficient documentation

## 2016-05-14 DIAGNOSIS — G939 Disorder of brain, unspecified: Secondary | ICD-10-CM

## 2016-05-14 MED ORDER — DAPSONE 100 MG PO TABS: 100.0000 mg | ORAL_TABLET | Freq: Every day | ORAL | 5 refills | Status: AC

## 2016-05-14 MED ORDER — DEXAMETHASONE 4 MG PO TABS: 4.0000 mg | ORAL_TABLET | Freq: Two times a day (BID) | ORAL | 0 refills | Status: AC

## 2016-05-14 NOTE — Progress Notes (Signed)
  Radiation Oncology         [phone] ________________________________  Name: JAESEAN OLSTAD MRN: [id number]  Date: 05/14/2016  DOB: July 03, 1960  SIMULATION AND TREATMENT PLANNING NOTE    ICD-9-CM ICD-10-CM   1. 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  Identity was confirmed.  All relevant records and images related to the planned course of therapy were reviewed.  The patient freely provided informed written consent to proceed with treatment after reviewing the details related to the planned course of therapy. The consent form was witnessed and verified by the simulation staff.  Then, the patient was set-up in a stable reproducible  supine position for radiation therapy.  CT images were obtained.  Surface markings were placed.  The CT images were loaded into the planning software.  Then the target and avoidance structures were contoured.  Treatment planning then occurred.  The radiation prescription was entered and confirmed.  Then, I designed and supervised the construction of a total of 3 medically necessary complex treatment devices.  I have requested : Isodose Plan.  I have ordered:dose calc.  PLAN:  The patient will receive 35 Gy in 14 fractions.  -----------------------------------  Blair Promise, PhD, MD

## 2016-05-14 NOTE — Telephone Encounter (Signed)
Patient's IgG to toxoplasma was negative.  We can take cerebral toxoplasmosis off of the differential diagnosis for his CNS lesions these are assuredly due to his metastatic rectal cancer.  He should start on dapsone for PCP prophylaxis at 100 mg per day though  I will cc his primary infectious disease doctor Dr. Ola Spurr. I've sent prescription for this into his pharmacy.

## 2016-05-14 NOTE — Progress Notes (Signed)
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 male who is here today to discuss recent MRI of the brain on 05/09/16. Imaging showed "innumerable parenchymal contrast-enhancing lesions throughout the brain." The patient initially experienced a sudden onset of aphasia with some confusion which lasted about 20 minutes. The patient presents to the clinic today to discuss the role that palliative radiation may play in his treatment.  The patient denies seizures, headaches, dizziness, or difficulty with hand coordination. He denies nausea. He denies visual deficits or changes, as well as confusion or memory deficits. 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  The patient's images were presented in the multidisciplinary brain conference earlier today and consensus was this was metastatic disease and not infection. Patient's toxoplasmosis titers turned out to be negative supporting the diagnosis of metastatic disease to the brain.  PREVIOUS RADIATION THERAPY: Yes  01/2011: definitive dose RT 45 Gy over 5 weeks with 16 Gy boost (Dr. 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    PAST SURGICAL HISTORY: Past Surgical History:  Procedure Laterality Date  . CARDIOVASCULAR STRESS TEST  04/ 2010   low risk study/  very small region of mildly reduced perfersion is mildly reversible dLAD territory  . COLONOSCOPY WITH PROPOFOL  05-28-2008  . CORONARY ANGIOPLASTY WITH STENT PLACEMENT  1998   [facility]   BM stent to pLAD and PCI to Mid and Distal LAD/  no other significant cad  . EUA W/ BX PERIANAL SKIN  06-08-2009  . EXCISION ANAL POLYP  2009  . INGUINAL HERNIA REPAIR Bilateral 1988  . LAPAROSCOPIC ASSISTED ABDOMINAL PERINEAL RESECTION N/A 04/22/2014   Procedure: LAPAROSCOPIC  ABDOMINAL PERINEAL RESECTION, ;  Surgeon: Leighton Ruff, MD;  Location: [facility];  Service: General;  Laterality: N/A;  . MASS EXCISION N/A 03/26/2014   Procedure: ANAL EXAM UNDER ANESTHESIA,EXCISIONAL BIOPSY OF ANAL MASS;  Surgeon: Leighton Ruff, MD;  Location: [facility];  Service: General;  Laterality: N/A;  . MUSCLE FLAP CLOSURE N/A 04/22/2014   Procedure: VRAM FLAP;  Surgeon: Irene Limbo, MD;  Location: [facility];  Service: Plastics;  Laterality: N/A;  . PORT-A-CATH PLACEMENT  02-21-2011   REMOVED  2013  . PORTACATH PLACEMENT Left 06/24/2014   Procedure: INSERTION PORT-A-CATH LEFT SUBCLAVIAN;  Surgeon: Leighton Ruff, MD;  Location: [facility];  Service: General;  Laterality: Left;  . RE-EXCISION ANAL SQUAMOUS CELL CARCINOMA  2010  . REMOVAL RECTAL MASS  01-08-2014  . RESECTION ANAL MASS  02-05-2011  . SEPTOPLASTY  1985  . SPHINCTEROTOMY  2009  . URETHROTOMY Bilateral 04/22/2014   Procedure: CYSTOSCOPY/URETHROTOMY;  Surgeon: Leighton Ruff, MD;  Location: [facility];  Service: General;  Laterality: Bilateral;    FAMILY HISTORY: family history includes Breast cancer in his mother; Coronary artery disease in his father; Diabetes type II in his mother; Hypertension in  his mother; Uterine cancer in his paternal grandmother.  SOCIAL HISTORY:  reports that he has never smoked. He has never used smokeless tobacco. He reports that he does not drink alcohol or use drugs.  ALLERGIES: Ace inhibitors and Sulfa antibiotics  MEDICATIONS:  Current Outpatient Prescriptions  Medication Sig Dispense Refill  . aspirin EC 81 MG tablet Take 81 mg by mouth at bedtime.    Marland Kitchen atorvastatin (LIPITOR) 80 MG tablet Take 1 tablet (80 mg total) by mouth every evening. 90 tablet 4  . citalopram (CELEXA) 40 MG tablet Take 40 mg by mouth daily with breakfast.    . efavirenz-emtrictabine-tenofovir (ATRIPLA) 600-200-300 MG per tablet Take 1 tablet by mouth at bedtime.     . Empagliflozin-Linagliptin (GLYXAMBI) 25-5 MG TABS Take 1 tablet by mouth daily with breakfast.     . glimepiride (AMARYL) 4 MG tablet Take 2 mg by mouth daily with breakfast.     . levETIRAcetam (KEPPRA) 500 MG tablet Take 1 tablet (500 mg total) by mouth 2 (two) times daily. 60 tablet 0  . losartan (COZAAR) 100 MG tablet TAKE 1 TABLET (100 MG TOTAL) BY MOUTH ONCE DAILY.  11  . metFORMIN (GLUCOPHAGE) 1000 MG tablet Take 1,000 mg by mouth 2 (two) times daily.      . Multiple Vitamin (MULTIVITAMIN WITH MINERALS) TABS tablet Take 1 tablet by mouth daily.    . Probiotic Product (PROBIOTIC DAILY) CAPS Take 1 tablet by mouth daily.     No current facility-administered medications for this encounter.    Facility-Administered Medications Ordered in Other Encounters  Medication Dose Route Frequency Provider Last Rate Last Dose  . heparin lock flush 100 unit/mL  500 Units Intravenous Once Truitt Merle, MD      . sodium chloride 0.9 % injection 10 mL  10 mL Intravenous PRN Truitt Merle, MD        REVIEW OF SYSTEMS:  A 15 point review of systems is documented in the electronic medical record. This was obtained by the nursing staff. However, I reviewed this with the patient to discuss relevant findings and make appropriate changes.   Pertinent items noted in HPI and remainder of comprehensive ROS otherwise negative.   PHYSICAL EXAM:  height is 5\' 10"  (1.778 m) and weight is 194 lb (88 kg). His oral temperature is 98.2 F (36.8 C). His blood pressure is 133/98 (abnormal) and his pulse is 91. His oxygen saturation is 98%.    General: Alert and oriented, in no acute distress HEENT: Head is normocephalic. Extraocular movements are intact. Oropharynx is clear. Neck: Neck is supple, no palpable cervical or supraclavicular lymphadenopathy. Heart: Regular in rate and rhythm with no murmurs, rubs, or gallops. Chest: Clear to auscultation bilaterally, with no rhonchi, wheezes, or rales. Abdomen: Soft, nontender, nondistended, with no rigidity or guarding. Extremities: No cyanosis or edema. Lymphatics: see Neck Exam Skin: No concerning lesions. Musculoskeletal: symmetric strength and muscle tone throughout. Neurologic: Cranial nerves II through XII are grossly intact. No obvious focalities. Speech is fluent. Coordination is intact. Psychiatric: Judgment and insight are intact. Flat affect. Some mild confusion.   ECOG = 1  LABORATORY DATA:  Lab Results  Component Value Date   WBC 5.0 05/09/2016   HGB 13.0 05/09/2016   HCT 37.4 (L) 05/09/2016  MCV 99.2 05/09/2016   PLT 246 05/09/2016   NEUTROABS 2.6 05/09/2016   Lab Results  Component Value Date   NA 135 05/09/2016   K 3.8 05/09/2016   CL 101 05/09/2016   CO2 25 05/09/2016   GLUCOSE 144 (H) 05/09/2016   CREATININE 1.47 (H) 05/09/2016   CALCIUM 9.1 05/09/2016      RADIOGRAPHY: Dg Chest 1 View  Result Date: 05/09/2016 CLINICAL DATA:  Weakness. EXAM: CHEST 1 VIEW COMPARISON:  Chest CT 12/16/2015.  Chest x-ray 06/24/2014. FINDINGS: Ill-defined nodule peripheral right base compatible with the right middle lobe lesions seen on previous CT scan. No other focal airspace consolidation. No pulmonary edema or pleural effusion. The cardiopericardial silhouette is within  normal limits for size. Left Port-A-Cath tip overlies the proximal SVC. Bones are diffusely demineralized. Telemetry leads overlie the chest. IMPRESSION: Irregular nodular density peripheral right lung base likely representing the lesions seen on previous CT scan. Otherwise no acute cardiopulmonary findings. Electronically Signed   By: Misty Stanley M.D.   On: 05/09/2016 20:15   Ct Head Wo Contrast  Result Date: 05/09/2016 CLINICAL DATA:  Acute onset altered mental status earlier today. Concern for stroke. Active chemotherapy for rectal cancer. EXAM: CT HEAD WITHOUT CONTRAST TECHNIQUE: Contiguous axial images were obtained from the base of the skull through the vertex without intravenous contrast. COMPARISON:  None. FINDINGS: Brain: There multiple small foci of abnormal increased density throughout the brain most concerning for metastatic disease. Largest apparent lesion in the right parietal lobe measures 1.4 cm. Many of these appear subcentimeter. Difficult to exclude petechial hemorrhage within the lesions. No significant midline shift. No hydrocephalus. No evidence of large vessel ischemia. Vascular: No hyperdense vessel. Skull: No evidence of osseous metastatic disease. Sinuses/Orbits: No acute finding. Other: None. IMPRESSION: Multiple hyper attenuating foci throughout the brain parenchyma most concerning for metastatic disease. Difficult to exclude petechial hemorrhage within some of these lesions. Recommend brain MRI with and without contrast for further evaluation. These results were called by telephone at the time of interpretation on 05/09/2016 at 8:05 pm to Dr. Rudene Re , who verbally acknowledged these results. Electronically Signed   By: Jeb Levering M.D.   On: 05/09/2016 20:05   Mr Brain W And Wo Contrast  Result Date: 05/09/2016 CLINICAL DATA:  Speech difficulties.  Difficulty standing. EXAM: MRI HEAD WITHOUT AND WITH CONTRAST TECHNIQUE: Multiplanar, multiecho pulse sequences  of the brain and surrounding structures were obtained without and with intravenous contrast. CONTRAST:  67mL MULTIHANCE GADOBENATE DIMEGLUMINE 529 MG/ML IV SOLN COMPARISON:  Head CT 05/09/2016 FINDINGS: Brain: There are innumerable ring-enhancing foci scattered throughout all areas of the supratentorial and infratentorial brain. Multiple lesion show a degree of diffusion restriction with central low T2 weighted signal. The largest lesion is located in the right parietal lobe measures 1.6 x 1.3 cm. There is no evidence of hemorrhage. No midline shift. No hydrocephalus or extra-axial fluid collection. No age advanced or lobar predominant atrophy. Vascular: Major intracranial arterial and venous sinus flow voids are preserved. No evidence of chronic microhemorrhage or amyloid angiopathy. Skull and upper cervical spine: The visualized skull base, calvarium, upper cervical spine and extracranial soft tissues are normal. Sinuses/Orbits: No fluid levels or advanced mucosal thickening. No mastoid effusion. Normal orbits. IMPRESSION: 1. Innumerable parenchymal contrast-enhancing lesions throughout the brain. The appearance is most consistent with metastatic disease. However, given the patient's HIV status, other considerations include infection, particularly toxoplasmosis, and CNS lymphoma. 2. No evidence of acute hemorrhage. No midline shift  or other evidence of herniation. Electronically Signed   By: Ulyses Jarred M.D.   On: 05/09/2016 21:54      IMPRESSION: This is a 55 year old male with multiple metastatic brain lesions. He is a good candidate for palliative whole brain radiation therapy.  He has too many lesions within the brain to consider stereotactic radiosurgery. I have recommended the patient not drive today and discussed this with his sister who is driving the patient today  PLAN: We discussed the risks, benefits, and side effects of whole brain radiation. We discussed the logistics of radiation for the  patient's education. The patient would like to move forward with radiation. He has been scheduled for his CT Simulation this afternoon. Treatment will begin next week and last approximately 3 weeks. The patient will be also placed on Decadron 4 mg twice a day.      ------------------------------------------------  Blair Promise, PhD, MD   This document serves as a record of services personally performed by Gery Pray, MD. It was created on his behalf by Maryla Morrow, a trained medical scribe. The creation of this record is based on the scribe's personal observations and the provider's statements to them. This document has been checked and approved by the attending provider.

## 2016-05-15 ENCOUNTER — Ambulatory Visit
Admission: RE | Admit: 2016-05-15 | Discharge: 2016-05-15 | Disposition: A | Discharge status: Home / Self Care | Payer: 59 | Source: Ambulatory Visit | Attending: Radiation Oncology | Admitting: Radiation Oncology

## 2016-05-15 ENCOUNTER — Encounter: Payer: Self-pay | Admitting: Radiation Oncology

## 2016-05-15 VITALS — BP 135/95 | HR 81 | Temp 98.1°F | Ht 70.0 in | Wt 195.8 lb

## 2016-05-15 DIAGNOSIS — C7931 Secondary malignant neoplasm of brain: Secondary | ICD-10-CM

## 2016-05-15 NOTE — Progress Notes (Signed)
  Radiation Oncology         [phone]) [phone] ________________________________  Name: Julian Palmer MRN: [id number]  Date: 05/15/2016  DOB: 12-Sep-1960  Weekly Radiation Therapy Management    ICD-9-CM ICD-10-CM   1. Brain metastases (HCC) 198.3 C79.31      Current Dose: 2.5 Gy     Planned Dose:  35 Gy  Narrative . . . . . . . . The patient presents for routine under treatment assessment.  The patient has received one fraction to his brain. He denies pain. He reports some mild nausea; "the thought of food right now makes me sick." The patient denies headaches, dizziness, or any other concerns at this time. He reports he began taking 4 mg of Decadron twice daily today; he took a dose this morning and will take another tonight. He inquires when he will be allowed to drive again.                                   The patient is without complaint.                                 Set-up films were reviewed.                                 The chart was checked. Physical Findings. . .  height is 5\' 10"  (1.778 m) and weight is 195 lb 12.8 oz (88.8 kg). His temperature is 98.1 F (36.7 C). His blood pressure is 135/95 (abnormal) and his pulse is 81. His oxygen saturation is 100%.  Weight essentially stable.  No significant changes. Cardiopulmonary assessment is negative for acute distress and he exhibits normal effort. Oropharynx is clear, with no signs of secondary infection. The patient continues to appear confused. Impression . . . . . . . The patient is tolerating radiation. Plan . . . . . . . . . . . . Continue treatment as planned. I advised the patient that I will make an assessment next time I see him, and we can discuss his return to driving at that time.  ________________________________   Blair Promise, PhD, MD  This document serves as a record of services personally performed by Gery Pray, MD. It was created on his behalf by Maryla Morrow, a trained medical scribe. The creation  of this record is based on the scribe's personal observations and the provider's statements to them. This document has been checked and approved by the attending provider.

## 2016-05-15 NOTE — Progress Notes (Signed)
  Radiation Oncology         (336) [phone] ________________________________  Name: Julian Palmer MRN: [id number]  Date: 05/15/2016  DOB: 1960-11-08  Simulation Verification Note    ICD-9-CM ICD-10-CM   1. Brain metastases ([name]) 198.3 C79.31     Status: outpatient  NARRATIVE: The patient was brought to the treatment unit and placed in the planned treatment position. The clinical setup was verified. Then port films were obtained and uploaded to the radiation oncology medical record software.  The treatment beams were carefully compared against the planned radiation fields. The position location and shape of the radiation fields was reviewed. They targeted volume of tissue appears to be appropriately covered by the radiation beams. Organs at risk appear to be excluded as planned.  Based on my personal review, I approved the simulation verification. The patient's treatment will proceed as planned.  -----------------------------------  Blair Promise, PhD, MD

## 2016-05-15 NOTE — Progress Notes (Signed)
Mr. Goelz presents for his first fraction of radiation to his Brain. He denies pain. He reports some mild nausea. He denies headaches, dizziness, or any other concerns at this time. He began taking 4 mg of decadron twice daily today. He took a dose this morning and will take his second dose of the day tonight. He would like Dr. Sondra Come to tell him when he will be able to drive again.   BP (!) 135/95   Pulse 81   Temp 98.1 F (36.7 C)   Ht 5\' 10"  (1.778 m)   Wt 195 lb 12.8 oz (88.8 kg)   SpO2 100% Comment: room air  BMI 28.09 kg/m    Wt Readings from Last 3 Encounters:  05/15/16 195 lb 12.8 oz (88.8 kg)  05/14/16 194 lb (88 kg)  05/09/16 199 lb 3.2 oz (90.4 kg)

## 2016-05-16 ENCOUNTER — Ambulatory Visit
Admission: RE | Admit: 2016-05-16 | Discharge: 2016-05-16 | Disposition: A | Discharge status: Home / Self Care | Payer: 59 | Source: Ambulatory Visit | Attending: Radiation Oncology | Admitting: Radiation Oncology

## 2016-05-16 DIAGNOSIS — C7931 Secondary malignant neoplasm of brain: Secondary | ICD-10-CM | POA: Diagnosis not present

## 2016-05-21 ENCOUNTER — Ambulatory Visit
Admission: RE | Admit: 2016-05-21 | Discharge: 2016-05-21 | Disposition: A | Discharge status: Home / Self Care | Payer: 59 | Source: Ambulatory Visit | Attending: Radiation Oncology | Admitting: Radiation Oncology

## 2016-05-21 ENCOUNTER — Inpatient Hospital Stay
Admission: RE | Admit: 2016-05-21 | Discharge: 2016-05-21 | Disposition: A | Discharge status: Home / Self Care | Payer: Self-pay | Source: Ambulatory Visit | Attending: Radiation Oncology | Admitting: Radiation Oncology

## 2016-05-21 DIAGNOSIS — C7931 Secondary malignant neoplasm of brain: Secondary | ICD-10-CM | POA: Diagnosis not present

## 2016-05-21 MED ORDER — SONAFINE EX EMUL
1.0000 "application " | Freq: Once | CUTANEOUS | Status: AC
  Administered 2016-05-21: 1 via TOPICAL

## 2016-05-21 NOTE — Progress Notes (Signed)
Pt here for patient teaching.  Pt given Radiation and You booklet and Sonafine. Reviewed areas of pertinence such as fatigue, hair loss, nausea and vomiting, skin changes, headache and earaches . Pt able to give teach back of to pat skin and use unscented/gentle soap,apply Sonafine bid and avoid applying anything to skin within 4 hours of treatment. Pt demonstrated understanding and verbalizes understanding of information given and will contact nursing with any questions or concerns.

## 2016-05-22 ENCOUNTER — Ambulatory Visit
Admission: RE | Admit: 2016-05-22 | Discharge: 2016-05-22 | Disposition: A | Discharge status: Home / Self Care | Payer: 59 | Source: Ambulatory Visit | Attending: Radiation Oncology | Admitting: Radiation Oncology

## 2016-05-22 ENCOUNTER — Encounter: Payer: Self-pay | Admitting: Radiation Oncology

## 2016-05-22 VITALS — BP 137/100 | HR 90 | Temp 98.7°F | Ht 70.0 in | Wt 187.0 lb

## 2016-05-22 DIAGNOSIS — C7931 Secondary malignant neoplasm of brain: Secondary | ICD-10-CM

## 2016-05-22 NOTE — Progress Notes (Signed)
  Radiation Oncology         [phone]) [phone] ________________________________  Name: Julian Palmer MRN: [id number]  Date: 05/22/2016  DOB: 08/06/1960  Weekly Radiation Therapy Management    ICD-9-CM ICD-10-CM   1. Brain metastases (HCC) 198.3 C79.31     Current Dose: 10 Gy     Planned Dose:  35 Gy  Narrative . . . . . . . . The patient presents for routine under treatment assessment.  The patient has completed 4 fractions a radiation to the brain. He reports a headache above his left eyebrow, which he rates 5/10 in severity; he denies taking any medication to combat his pain. He reports fatigue. Reports some occasional nausea. The patient reports an unsteady gait. He admits to some confusion and has had recent difficulty using his phone because he can't remember numbers. He reports he is eating well. The patient is taking 4 mg decadron twice daily. The patient is accompanied by his brother. The patient expresses interest in driving, and asks if he can.                                   The patient is without complaint.                                 Set-up films were reviewed.                                 The chart was checked. Physical Findings. . .  height is 5\' 10"  (1.778 m) and weight is 187 lb (84.8 kg). His temperature is 98.7 F (37.1 C). His blood pressure is 137/100 (abnormal) and his pulse is 90. His oxygen saturation is 97%.  Weight essentially stable.  No significant changes. Cardiopulmonary assessment is negative for acute distress and he exhibits normal effort. Oropharynx is clear, with no signs of secondary infection. The patient appears to be more confused during this exam than those previous. Impression . . . . . . . The patient is tolerating radiation. Plan . . . . . . . . . . . . Continue treatment as planned. I advised the patient that it is unsafe for him to drive his car at this time. Brother who is in attendance agrees.  ________________________________   Blair Promise, PhD, MD  This document serves as a record of services personally performed by Gery Pray, MD. It was created on his behalf by Maryla Morrow, a trained medical scribe. The creation of this record is based on the scribe's personal observations and the provider's statements to them. This document has been checked and approved by the attending provider.

## 2016-05-22 NOTE — Progress Notes (Signed)
Julian Palmer is here for his 4th fraction of radiation to his Brain. He reports a headache which he rates a 5/10 above his Left eyebrow. He reports fatigue. He reports an unsteady gait. He admits to some confusion and has had recent difficulty using his phone, because he can't remember numbers. He reports a decreased appetite, and is not eating as well as he should. He is taking 4 mg decadron twice daily. He is using the sonafine 1-2 times daily.  BP (!) 137/100   Pulse 90   Temp 98.7 F (37.1 C)   Ht 5\' 10"  (1.778 m)   Wt 187 lb (84.8 kg)   SpO2 97% Comment: room air  BMI 26.83 kg/m    Wt Readings from Last 3 Encounters:  05/22/16 187 lb (84.8 kg)  05/15/16 195 lb 12.8 oz (88.8 kg)  05/14/16 194 lb (88 kg)

## 2016-05-23 ENCOUNTER — Ambulatory Visit
Admission: RE | Admit: 2016-05-23 | Discharge: 2016-05-23 | Disposition: A | Discharge status: Home / Self Care | Payer: 59 | Source: Ambulatory Visit | Attending: Radiation Oncology | Admitting: Radiation Oncology

## 2016-05-23 DIAGNOSIS — C7931 Secondary malignant neoplasm of brain: Secondary | ICD-10-CM | POA: Diagnosis not present

## 2016-05-23 NOTE — Progress Notes (Signed)
Advised Julian Palmer to increase his decadron 4 mg tablet from twice a day to three times a day per Dr. Sondra Come to help with his confusion and nausea.  Advised him to take one tablet in the morning, one at lunch and one at dinner.  [name] verbalized understanding and agreement in teach back mode.  He said that he manages his medications and does not want this nurse to contact his family about the medication change.

## 2016-05-24 ENCOUNTER — Ambulatory Visit
Admission: RE | Admit: 2016-05-24 | Discharge: 2016-05-24 | Disposition: A | Discharge status: Home / Self Care | Payer: 59 | Source: Ambulatory Visit | Attending: Radiation Oncology | Admitting: Radiation Oncology

## 2016-05-24 DIAGNOSIS — C7931 Secondary malignant neoplasm of brain: Secondary | ICD-10-CM | POA: Diagnosis not present

## 2016-05-25 ENCOUNTER — Telehealth: Payer: Self-pay | Admitting: *Deleted

## 2016-05-25 ENCOUNTER — Other Ambulatory Visit: Payer: Self-pay | Admitting: *Deleted

## 2016-05-25 ENCOUNTER — Ambulatory Visit
Admission: RE | Admit: 2016-05-25 | Discharge: 2016-05-25 | Disposition: A | Discharge status: Home / Self Care | Payer: 59 | Source: Ambulatory Visit | Attending: Radiation Oncology | Admitting: Radiation Oncology

## 2016-05-25 ENCOUNTER — Encounter: Payer: Self-pay | Admitting: Hematology

## 2016-05-25 ENCOUNTER — Telehealth: Payer: Self-pay | Admitting: Medical Oncology

## 2016-05-25 ENCOUNTER — Ambulatory Visit (HOSPITAL_BASED_OUTPATIENT_CLINIC_OR_DEPARTMENT_OTHER): Payer: 59 | Admitting: Hematology

## 2016-05-25 VITALS — BP 137/86 | HR 85 | Temp 98.6°F | Resp 18 | Ht 70.0 in | Wt 191.7 lb

## 2016-05-25 DIAGNOSIS — C211 Malignant neoplasm of anal canal: Secondary | ICD-10-CM

## 2016-05-25 DIAGNOSIS — F32A Depression, unspecified: Secondary | ICD-10-CM

## 2016-05-25 DIAGNOSIS — C20 Malignant neoplasm of rectum: Secondary | ICD-10-CM

## 2016-05-25 DIAGNOSIS — IMO0002 Reserved for concepts with insufficient information to code with codable children: Secondary | ICD-10-CM

## 2016-05-25 DIAGNOSIS — E1165 Type 2 diabetes mellitus with hyperglycemia: Secondary | ICD-10-CM

## 2016-05-25 DIAGNOSIS — C7951 Secondary malignant neoplasm of bone: Principal | ICD-10-CM

## 2016-05-25 DIAGNOSIS — C7931 Secondary malignant neoplasm of brain: Secondary | ICD-10-CM | POA: Diagnosis not present

## 2016-05-25 DIAGNOSIS — B20 Human immunodeficiency virus [HIV] disease: Secondary | ICD-10-CM

## 2016-05-25 DIAGNOSIS — E1159 Type 2 diabetes mellitus with other circulatory complications: Secondary | ICD-10-CM

## 2016-05-25 DIAGNOSIS — F329 Major depressive disorder, single episode, unspecified: Secondary | ICD-10-CM

## 2016-05-25 DIAGNOSIS — I2510 Atherosclerotic heart disease of native coronary artery without angina pectoris: Secondary | ICD-10-CM | POA: Diagnosis not present

## 2016-05-25 DIAGNOSIS — Z794 Long term (current) use of insulin: Secondary | ICD-10-CM

## 2016-05-25 DIAGNOSIS — I10 Essential (primary) hypertension: Secondary | ICD-10-CM

## 2016-05-25 DIAGNOSIS — Z7189 Other specified counseling: Secondary | ICD-10-CM

## 2016-05-25 NOTE — Telephone Encounter (Signed)
Called hospital [facility] & spoke with Mechele Dawley, covering for Edwinna Areola RN & informed of referral & needs to be seen ASAP.

## 2016-05-25 NOTE — Telephone Encounter (Signed)
Brother, Julian Palmer called and stated " Julian Palmer is really going down hill this week. He is confused , cannot figure out how to dial a phone , having trouble walking and unsteady, he cannot manage his colostomy irrigation anymore and he does not know when he has changed his last bag. I called hospice and they need a doctors order. " I told Julian Palmer that I  will notify Dr Burr Medico and get back to him . XRT today is at 1630. His brother can bring him anytime today.

## 2016-05-25 NOTE — Telephone Encounter (Signed)
I will see him this afternoon at 3:45pm before his radiation to discuss hospice. Left a VM to pt ad will try his other numbers.   Truitt Merle MD
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
## 2016-05-28 ENCOUNTER — Telehealth: Payer: Self-pay | Admitting: Oncology

## 2016-05-28 ENCOUNTER — Ambulatory Visit: Payer: 59

## 2016-05-28 ENCOUNTER — Telehealth: Payer: Self-pay | Admitting: *Deleted

## 2016-05-28 NOTE — Telephone Encounter (Signed)
Called Julian Palmer regarding his radiation treatment today.  Spoke to his friend Barbaraann Share who put the call on speaker phone.  [name] said he wants to stop radiation treatment and will start hospice.  Notified Melanie, RT on [place] 3.

## 2016-05-28 NOTE — Telephone Encounter (Signed)
Spoke with Hilda Blades, intake at [facility] for referral today. Hospice   Phone      [phone]     ;      Fax       [phone].

## 2016-05-28 NOTE — Telephone Encounter (Signed)
Received message from [place], [place] @ [facility] re:  Ria Comment had met with pt yesterday for home health assessment.  Pt informed Ria Comment that he did not wish to have radiation;  Pt wished to have hospice services now. Spoke with sister Marjory Sneddon and confirmed that pt did not want radiation, and in agreement with hospice.   Per Bethena Roys, pt is very confused;  However, pt did state  he wanted hospice services  ASAP yesterday.  Dr. Burr Medico aware. [facility] referral made today.

## 2016-05-28 NOTE — Telephone Encounter (Signed)
Thanks for letting me know. I will make hospice referral today.   Truitt Merle MD

## 2016-05-29 ENCOUNTER — Encounter: Payer: Self-pay | Admitting: Radiation Oncology

## 2016-05-29 ENCOUNTER — Ambulatory Visit: Payer: 59 | Admitting: Radiation Oncology

## 2016-05-29 ENCOUNTER — Ambulatory Visit: Payer: 59

## 2016-05-29 NOTE — Progress Notes (Signed)
  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 The patient and his family elected to discontinue radiation therapy at this time, and the patient has been referred to [facility].  Plan: I will be available to the patient and his family should they have any  questions or concerns.  -----------------------------------  Blair Promise, PhD, MD  This document serves as a record of services personally performed by Gery Pray, MD. It was created on his behalf by Maryla Morrow, a trained medical scribe. The creation of this record is based on the scribe's personal observations and the provider's statements to them. This document has been checked and approved by the attending provider.

## 2016-05-30 ENCOUNTER — Ambulatory Visit: Payer: 59

## 2016-05-30 ENCOUNTER — Telehealth: Payer: Self-pay | Admitting: *Deleted

## 2016-05-30 ENCOUNTER — Telehealth: Payer: Self-pay | Admitting: Medical Oncology

## 2016-05-30 NOTE — Telephone Encounter (Signed)
Hospice needs orders for colostomy irrigation and how often to irrigate and how  much saline to irrigate . Pt has been irrigating is daily . Call [facility] with orders.[phone].

## 2016-05-30 NOTE — Telephone Encounter (Signed)
Received vm call from [facility] nurse requesting script for electric hospital bed with rails & extension & over bed table sent to [facility] @ [phone].  Ph # [phone]. She reports pt's insurance presently doesn't cover under hospice but hopefully after 06/25/16 there will be coverage & can be switched to hospice.

## 2016-05-30 NOTE — Telephone Encounter (Signed)
Order signed for DME & faxed to [facility] per Hospice Nurse request.

## 2016-05-31 ENCOUNTER — Ambulatory Visit: Payer: 59

## 2016-05-31 ENCOUNTER — Telehealth: Payer: Self-pay | Admitting: *Deleted

## 2016-05-31 NOTE — Telephone Encounter (Signed)
Received vm request from [facility] for [place] notes for insurance documentation for DME.  Faxed last OV note & D/C summary from last hospital visit.

## 2016-06-01 ENCOUNTER — Ambulatory Visit: Payer: 59

## 2016-06-04 ENCOUNTER — Ambulatory Visit: Payer: 59

## 2016-06-05 ENCOUNTER — Ambulatory Visit: Payer: 59

## 2016-06-19 ENCOUNTER — Other Ambulatory Visit: Payer: 59

## 2016-06-19 ENCOUNTER — Ambulatory Visit: Payer: 59 | Admitting: Hematology

## 2016-07-26 DEATH — deceased

## 2016-09-12 ENCOUNTER — Telehealth: Payer: Self-pay | Admitting: *Deleted

## 2016-09-12 NOTE — Telephone Encounter (Signed)
Received vm call from pt's sister, Marjory Sneddon requesting call back.  Returned call & she is asking about how long had her brother's mets to brain been growing & if there was any way to tell.  She reports that he was making some legal decisions that were out of character for him during this time.  She reports that they may be involved in a lawsuit regarding some of these decisions.  Message given to Dr Burr Medico.  Judy's call back # is [phone].

## 2016-09-13 NOTE — Telephone Encounter (Signed)
Entered in error

## 2016-10-05 ENCOUNTER — Other Ambulatory Visit: Payer: Self-pay | Admitting: Nurse Practitioner
